# Patient Record
Sex: Female | Born: 1975
Health system: Southern US, Community
[De-identification: ages and names within clinical notes are randomized; demographics above are authoritative.]

## PROBLEM LIST (undated history)

## (undated) DIAGNOSIS — I509 Heart failure, unspecified: Secondary | ICD-10-CM

## (undated) DIAGNOSIS — F419 Anxiety disorder, unspecified: Secondary | ICD-10-CM

## (undated) DIAGNOSIS — I10 Essential (primary) hypertension: Secondary | ICD-10-CM

## (undated) HISTORY — DX: Essential (primary) hypertension: I10

## (undated) HISTORY — PX: ABDOMINAL HYSTERECTOMY: SHX81

## (undated) HISTORY — DX: Anxiety disorder, unspecified: F41.9

---

## 1997-11-26 ENCOUNTER — Emergency Department (HOSPITAL_COMMUNITY): Admission: EM | Admit: 1997-11-26 | Discharge: 1997-11-26 | Payer: Self-pay | Admitting: Emergency Medicine

## 2000-03-28 ENCOUNTER — Inpatient Hospital Stay (HOSPITAL_COMMUNITY): Admission: EM | Admit: 2000-03-28 | Discharge: 2000-03-28 | Payer: Self-pay | Admitting: *Deleted

## 2002-07-08 ENCOUNTER — Emergency Department (HOSPITAL_COMMUNITY): Admission: EM | Admit: 2002-07-08 | Discharge: 2002-07-08 | Payer: Self-pay | Admitting: Emergency Medicine

## 2002-07-17 ENCOUNTER — Emergency Department (HOSPITAL_COMMUNITY): Admission: EM | Admit: 2002-07-17 | Discharge: 2002-07-17 | Payer: Self-pay | Admitting: Emergency Medicine

## 2003-03-12 ENCOUNTER — Inpatient Hospital Stay (HOSPITAL_COMMUNITY): Admission: AD | Admit: 2003-03-12 | Discharge: 2003-03-12 | Payer: Self-pay | Admitting: Obstetrics and Gynecology

## 2003-03-12 ENCOUNTER — Encounter: Payer: Self-pay | Admitting: Obstetrics and Gynecology

## 2003-04-01 ENCOUNTER — Encounter: Admission: RE | Admit: 2003-04-01 | Discharge: 2003-04-01 | Payer: Self-pay | Admitting: Obstetrics and Gynecology

## 2003-04-02 ENCOUNTER — Encounter: Payer: Self-pay | Admitting: Obstetrics and Gynecology

## 2003-04-02 ENCOUNTER — Ambulatory Visit (HOSPITAL_COMMUNITY): Admission: RE | Admit: 2003-04-02 | Discharge: 2003-04-02 | Payer: Self-pay | Admitting: Obstetrics and Gynecology

## 2003-05-05 ENCOUNTER — Inpatient Hospital Stay (HOSPITAL_COMMUNITY): Admission: AD | Admit: 2003-05-05 | Discharge: 2003-05-08 | Payer: Self-pay | Admitting: Obstetrics and Gynecology

## 2003-05-05 ENCOUNTER — Encounter (INDEPENDENT_AMBULATORY_CARE_PROVIDER_SITE_OTHER): Payer: Self-pay

## 2003-05-20 ENCOUNTER — Encounter: Admission: RE | Admit: 2003-05-20 | Discharge: 2003-05-20 | Payer: Self-pay | Admitting: Obstetrics and Gynecology

## 2003-05-29 ENCOUNTER — Encounter: Admission: RE | Admit: 2003-05-29 | Discharge: 2003-05-29 | Payer: Self-pay | Admitting: Obstetrics and Gynecology

## 2003-06-05 ENCOUNTER — Encounter: Admission: RE | Admit: 2003-06-05 | Discharge: 2003-06-05 | Payer: Self-pay | Admitting: Obstetrics and Gynecology

## 2003-08-23 HISTORY — PX: ABDOMINAL HYSTERECTOMY: SHX81

## 2003-09-10 ENCOUNTER — Encounter: Admission: RE | Admit: 2003-09-10 | Discharge: 2003-09-10 | Payer: Self-pay | Admitting: Obstetrics and Gynecology

## 2003-09-23 ENCOUNTER — Encounter: Admission: RE | Admit: 2003-09-23 | Discharge: 2003-09-23 | Payer: Self-pay | Admitting: Obstetrics and Gynecology

## 2004-04-25 ENCOUNTER — Emergency Department (HOSPITAL_COMMUNITY): Admission: EM | Admit: 2004-04-25 | Discharge: 2004-04-25 | Payer: Self-pay | Admitting: Emergency Medicine

## 2007-12-22 ENCOUNTER — Emergency Department (HOSPITAL_COMMUNITY): Admission: EM | Admit: 2007-12-22 | Discharge: 2007-12-22 | Payer: Self-pay | Admitting: Emergency Medicine

## 2009-01-22 ENCOUNTER — Emergency Department (HOSPITAL_COMMUNITY): Admission: EM | Admit: 2009-01-22 | Discharge: 2009-01-23 | Payer: Self-pay | Admitting: Emergency Medicine

## 2009-11-02 ENCOUNTER — Inpatient Hospital Stay (HOSPITAL_COMMUNITY): Admission: AD | Admit: 2009-11-02 | Discharge: 2009-11-03 | Payer: Self-pay | Admitting: Family Medicine

## 2010-11-12 LAB — DIFFERENTIAL
Basophils Absolute: 0 10*3/uL (ref 0.0–0.1)
Basophils Relative: 0 % (ref 0–1)
Eosinophils Absolute: 0.3 10*3/uL (ref 0.0–0.7)
Lymphocytes Relative: 17 % (ref 12–46)
Lymphs Abs: 2.5 10*3/uL (ref 0.7–4.0)
Monocytes Absolute: 1 10*3/uL (ref 0.1–1.0)
Neutro Abs: 11.1 10*3/uL — ABNORMAL HIGH (ref 1.7–7.7)

## 2010-11-12 LAB — CBC
HCT: 36.9 % (ref 36.0–46.0)
MCHC: 32.7 g/dL (ref 30.0–36.0)
MCV: 81.4 fL (ref 78.0–100.0)
Platelets: 312 10*3/uL (ref 150–400)
RDW: 14.7 % (ref 11.5–15.5)

## 2010-11-12 LAB — URINALYSIS, ROUTINE W REFLEX MICROSCOPIC
Ketones, ur: NEGATIVE mg/dL
Nitrite: NEGATIVE
Specific Gravity, Urine: 1.01 (ref 1.005–1.030)

## 2010-11-12 LAB — POCT PREGNANCY, URINE: Preg Test, Ur: NEGATIVE

## 2010-11-12 LAB — COMPREHENSIVE METABOLIC PANEL
AST: 13 U/L (ref 0–37)
Albumin: 3.3 g/dL — ABNORMAL LOW (ref 3.5–5.2)
BUN: 10 mg/dL (ref 6–23)
Calcium: 9 mg/dL (ref 8.4–10.5)
Creatinine, Ser: 0.69 mg/dL (ref 0.4–1.2)
GFR calc Af Amer: >60 mL/min (ref >60)

## 2010-11-12 LAB — GC/CHLAMYDIA PROBE AMP, GENITAL: Chlamydia, DNA Probe: NEGATIVE

## 2010-11-12 LAB — WET PREP, GENITAL

## 2010-11-29 LAB — URINALYSIS, ROUTINE W REFLEX MICROSCOPIC
Bilirubin Urine: NEGATIVE
Nitrite: NEGATIVE
Specific Gravity, Urine: 1.015 (ref 1.005–1.030)
pH: 6.5 (ref 5.0–8.0)

## 2010-11-29 LAB — URINE MICROSCOPIC-ADD ON

## 2011-01-07 NOTE — Group Therapy Note (Signed)
NAME:  DE, LIBMAN                          ACCOUNT NO.:  [id number]   MEDICAL RECORD NO.:  [id number]                   PATIENT TYPE:  OUT   LOCATION:  [facility]                           FACILITY:  [facility]   PHYSICIAN:  Argentina Donovan, MD                     DATE OF BIRTH:  11/14/1975   DATE OF SERVICE:  09/23/2003                                    CLINIC NOTE   REASON FOR VISIT:  The patient is a 35 year old nulligravida who is  postoperative for right oophorectomy and left ovarian cystectomy for benign  mature hematomas who had menopausal symptoms, was checked in October, and  found to have an elevated FSH and LH and was put on Mircette and calcium.  On the Mircette she had extraordinarily heavy periods, needing to wear four  pads and a diaper at a time, was placed on Ortho Tri-Cyclen.  She has had no  bleeding during the cycle.  We will observe and see what her period is like.  If her period is extremely heavy in this patient with no insurance I think  possibly we will consider placing the Mirena IUD and then continuing on a  low-dose birth control pill in the tricycle fashion.  The patient will call  after her period and let us know how that is going on.   IMPRESSION:  Dysfunctional uterine bleeding.                                               Argentina Donovan, MD    PR/MEDQ  D:  09/23/2003  T:  09/23/2003  Job:  [id number]

## 2011-01-07 NOTE — Group Therapy Note (Signed)
   NAME:  Lydia Thompson, Lydia Thompson                          ACCOUNT NO.:  [id number]   MEDICAL RECORD NO.:  [id number]                   PATIENT TYPE:  OUT   LOCATION:  [facility]                           FACILITY:  [facility]   PHYSICIAN:  Argentina Donovan, MD                     DATE OF BIRTH:  [birth year]   DATE OF SERVICE:  04/01/2003                                    CLINIC NOTE   The patient is a 35 year old nulligravid white female with a last menstrual  period of February 23, 2003, and another period that has begun today as she sits  here in the clinic.  She used Norplant from age 71 to 70, had it removed at  age 4, and from 61 to 19 had Depo-Provera.  Has not been on contraceptives  since.  Has always suffered from dysmenorrhea, and her periods have been  quite irregular, sometimes missing a couple of months, other times having  two within a month.  Thinking she was pregnant because of abdominal bloating  and nausea that occurred one day on the 21st of July, she went into the [facility]  for evaluation.  They did a sonogram at that time which showed bilateral  ovarian masses, complex cysts, measuring 6 x 6 x 7 cm and 5 x 8 x 6 cm.   On examination today the abdomen was soft, flat, nontender.  No masses,  organomegaly palpable.  Pelvic examination:  External genitalia was normal.  Vagina was clean and well rugated.  BUS within normal limits, with a large  amount of bleeding from the cervical os, which made doing an adequate Pap  smear not possible.  Bimanual pelvic examination reveals a large cul-de-sac  mass that could not be separated from the uterus, and the adnexa could not  be well outlined because of the habitus of the patient, being 213 pounds.   IMPRESSION:  Bilateral complex ovarian cysts by sonogram.   PLAN:  Get a CT scan to clarify a little more what we are dealing with and a  CA125.  I plan after that to call the patient and probably schedule her for  exploratory laparotomy.                                    Argentina Donovan, MD    PR/MEDQ  D:  04/01/2003  T:  04/02/2003  Job:  [id number]

## 2011-01-07 NOTE — Discharge Summary (Signed)
NAME:  Lydia Thompson, Lydia Thompson                          ACCOUNT NO.:  [id number]   MEDICAL RECORD NO.:  [id number]                   PATIENT TYPE:  INP   LOCATION:  9148                                 [facility]ILITY[facility]   PHYSICIAN:  Phil D. Okey Dupre, M.D.                  DATE OF BIRTH:  1975/11/10   DATE OF ADMISSION:  05/05/2003  DATE OF DISCHARGE:  05/08/2003                                 DISCHARGE SUMMARY   HOSPITAL COURSE:  The patient is a 35 year old nulligravida black female who  was admitted with two large ovarian cysts and underwent exploratory  laparotomy, right oophorectomy, left multiple ovarian cystectomy, and  reconstruction of the ovary on the day of admission.  Frozen section during  the surgery revealed some atypia in the left ovarian tissue, both ovaries  which showed cystic teratomas.  The patient has done very well  postoperatively, has been completely afebrile.  The hemoglobin on admission  which was 12.1 did drop to 8.8 on the day following surgery and was 8.8 on  the postoperative day #2 as well, so that has been stable, as has her white  count.  No other significant abnormal laboratory tests during her hospital  stay.   PHYSICAL EXAMINATION AT DISCHARGE:  HEENT:  Within normal limits.  NECK:  Supple with no masses.  LUNGS:  Clear to auscultation and percussion.  ABDOMEN:  Soft, flat, nontender, no masses, no organomegaly, with good bowel  sounds.  The incision is clean and healing well after staples were removed.  EXTREMITIES:  Normal.   INSTRUCTIONS:  We have given discharge instructions to the patient as to  follow-up, activities - specifically heavy lifting and driving.  She will  return to the clinic in two weeks for wound examination and also to receive  results of permanent pathology.  The pathology final report called to me by  the pathologist the day prior to discharge was pretty much the same as it  had been during the frozen section.  I consider that this is  benign;  however, it is suspicious enough that he wanted to send it for a second  opinion to [facility] pathology to confirm that, so  we should have that result for the patient when she returns in two weeks.   DISCHARGE MEDICATIONS:  SlowFe for iron replacement and Percocet for pain.   DISCHARGE DIAGNOSIS:  Status postoperative right oophorectomy, left ovarian  cystectomy and reconstruction of ovary for bilateral cystic teratomas with  secondary postoperative anemia.                                               Phil D. Okey Dupre, M.D.    PDR/MEDQ  D:  05/08/2003  T:  05/08/2003  Job:  [id number]

## 2011-01-07 NOTE — Op Note (Signed)
NAME:  Lydia Thompson, Lydia Thompson                          ACCOUNT NO.:  [id number]   MEDICAL RECORD NO.:  [id number]                   PATIENT TYPE:  INP   LOCATION:  9199                                 [facility]   PHYSICIAN:  Phil D. Okey Dupre, M.D.                  DATE OF BIRTH:  [birth year]   DATE OF PROCEDURE:  05/05/2003  DATE OF DISCHARGE:                                 OPERATIVE REPORT   PREOPERATIVE DIAGNOSIS:  Bilateral cystic teratoma of the ovaries.   POSTOPERATIVE DIAGNOSIS:  Right benign cystic keratoma, left cystic teratoma  with areas of atypia all by frozen section pending permanent pathology  report.   PROCEDURE:  Exploratory laparotomy, multiple left ovarian cystectomy,  reconstruction of left ovary, right oophorectomy.   SURGEON:  Javier Glazier. Rose, M.D.   ESTIMATED BLOOD LOSS:  200 mL.   ANESTHESIA:  General anesthesia.   OPERATIVE FINDINGS:  The left ovary measured approximately 10 x 6 cm and was  multicystic with obvious areas of teratoma where hair could be seen through  the wall.  The right ovary measured 14 x 10 x 7 cm and no areas of  apparently normal tissue could be seen.   DESCRIPTION OF PROCEDURE:  Under satisfactory general anesthesia, with the  patient in the dorsal supine position, a Foley catheter in the urinary  bladder, abdomen was prepped and draped in usual sterile manner and through  a transverse Pfannenstiel incision situated 3 cm above the symphysis pubis  and extending for a total length of 20 cm.  The abdomen was entered by  layers.  On entering the peritoneal cavity, the findings were as  aforementioned.  The uterus was normal size.  I was unable to bring out of  the incision the right ovary as the measurements were previously described,  so the right rectus muscle was transected and the inferior epigastric  vessels tied off with #1 chromic catgut sutures.  This allowed  exteriorization of both ovaries and they were as aforementioned.  The  ovaries on the left were shelled out, careful dissection of the cyst on the  left and as we got down, there was a small amount of viable ovarian tissue  which was reconstructed with 3-0 chromic catgut suture on a GI needle using  a mattress suture backed into what appeared to be a viable ovary measuring  approximately 4 cm by 2 cm and the tube on that side was normal.  The left  ovary was extraordinarily vascular around the pedicle.  I saw no area that  appeared to be normal and attempting to go through that area, we ruptured  the dermoid.  It was decided then to take out that ovary as there was  probably little viable tissue left because of the size.  Clamps were placed  across the meso beneath the ovary serially to separate it from the  fallopian  tube which was normal and the pedicle was ligated with interrupted suture  ligatures of 3-0 chromic catgut.  Both were sent for pathological frozen  section and we got a report back on the left ovary that there were areas of  concern and atypia but it was not confirmation enough to indicate necessity  for removal of that ovary. Washings of the pelvis were sent for pathological  diagnosis and cell block.  The right ovary was reported back as a benign  cystic teratoma with mainly brain tissue.  Examination of the upper abdomen  was normal and the area was irrigated and the remaining pedicles observed  for any bleeding, none was noted. The fascia was closed with continuous  running alternating locked 0 Vicryl from either incision meeting in the  midpoint.  Subcutaneous closure was accomplished with 2-0 plain catgut  suture and  skin edge approximated with skin staples.  A dry sterile pressure dressing  was applied and the patient was transferred to the recovery room with 200 mL  blood loss.  Tape, sponge, needle and instrument counts were reported  correct at the end of the procedure.                                               Phil D. Okey Dupre,  M.D.    PDR/MEDQ  D:  05/05/2003  T:  05/05/2003  Job:  [id number]

## 2011-01-07 NOTE — H&P (Signed)
NAME:  Lydia Thompson, Lydia Thompson                          ACCOUNT NO.:  [id number]   MEDICAL RECORD NO.:  [id number]                   PATIENT TYPE:  INP   LOCATION:  NA                                   [facility]   PHYSICIAN:  Phil D. Okey Dupre, M.D.                  DATE OF BIRTH:  Mar 14, 1976   DATE OF ADMISSION:  05/05/2003  DATE OF DISCHARGE:                                HISTORY & PHYSICAL   PREOPERATIVE HISTORY AND PHYSICAL:   CHIEF COMPLAINT:  I was told I had ovarian cysts.   PRESENT ILLNESS:  The patient is a 35 year old nulligravida white female  with her last menstrual period beginning today on April 30, 2003.  Previous menstrual period was April 01, 2003.  She used Norplant from the  age of 39 to 37 and had it removed at 51, from 61 to 40 she had Depo-Provera  and has not been on contraceptives since that time.  She has always suffered  from severe dysmenorrhea, her periods have been quite irregular up until  lately, sometimes missing a couple months, other times having two within a  month.  Thinking she was pregnant because of abdominal bloating and nausea  that occurred in July she went into [facility]U for evaluation.  They did a sonogram  at that time that showed bilateral ovarian masses with complex cysts  measuring 6 x 6 x 7 and 5 x 8 x 6 cm and she came into the clinic on April 01, 2003 and was scheduled for diagnostic D&C because of heavy vaginal  bleeding history and an exploratory laparotomy with probable bilateral  ovarian cystectomies, possible oophorectomy.  We discussed this with the  patient in detail although by all indications these are probably benign.   ALLERGIES:  The patient has no known allergies.   MEDICATIONS:  She is on no medications on a usual basis.   PAST MEDICAL HISTORY:  There is no significant past medical history except  those in the present illness and she does have sickle cell trait.   FAMILY HISTORY:  Diabetes in her grandmother, her mother  and several other  relatives have arthritis.   REVIEW OF SYSTEMS:  Really negative except for the present illness and at  this time even those symptoms are gone.   PHYSICAL EXAMINATION:  VITAL SIGNS:  Her blood pressure is 125/84.  Her  pulse is 73.  Her temperature is 98.9.  Respirations are 14 per minute.  The  patient weighs 213 pounds.  GENERAL:  She is well-developed slightly obese white female in no acute  distress.  HEENT:  Within normal limits.  NECK:  Supple.  Her thyroid is bilaterally symmetrical with no masses.  LUNGS:  Clear to auscultation and percussion.  HEART:  No murmur.  Normal sinus rhythm.  Point of maximal impulse in the  fifth intercostal space and midclavicular  line.  BREASTS:  Symmetrical with no dominant masses and no nipple discharge.  ABDOMEN:  Soft, flat, nontender.  No masses nor organomegaly palpable.  PELVIC:  External genitalia is normal.  The vagina is clean and well  rugated.  BUS within normal limits.  Large amount of bleeding at the  cervical os.  The Pap smear was delayed.  Bimanual pelvic examination  reveals a large cul-de-sac mass, it could not be separated from the uterus  and the adnexa could not be well outlined because of habitus of the patient  and no adenopathy noted.  EXTREMITIES:  No edema.  No varices.  DTRs within normal limits.  SKIN:  Normal turgor and pallor.   IMPRESSION:  Bilateral complex ovarian cysts by sonogram.  The CT scan of  the pelvis showed large bilateral ovarian dermoids measuring 12 cm on the  right and 9 cm on the left.   PLAN:  Has been above outlined.                                               Phil D. Okey Dupre, M.D.    PDR/MEDQ  D:  05/01/2003  T:  05/01/2003  Job:  [id number]

## 2011-01-07 NOTE — Group Therapy Note (Signed)
   NAME:  Lydia Thompson, Lydia Thompson                          ACCOUNT NO.:  [id number]   MEDICAL RECORD NO.:  [id number]                   PATIENT TYPE:  OUT   LOCATION:  [facility]                           [facility]AC[facility]  [facility]   PHYSICIAN:  Tinnie Gens, MD                     DATE OF BIRTH:  02-Dec-1975   DATE OF SERVICE:  06/05/2003                                    CLINIC NOTE   CHIEF COMPLAINT:  Recheck.   HISTORY OF PRESENT ILLNESS:  The patient is a 35 year old nulligravida who  comes in today for postoperative follow-up.  She is reporting continued hot  flashes that seem to be worsening.  She has not had a period since her  surgery.  She is status post right oophorectomy and left ovarian cystectomy  for bilateral mature teratomas.  She also was seen with this complaint a  week ago and had hormone levels drawn at that time and a Pap smear as well.  Review of her Pap smear reveals that it was normal and her labs show an FHS  of 29.4 and an LH of 27.4 consistent with menopause.   PHYSICAL EXAMINATION:  GENERAL:  She is an obese black female in no acute  distress.  VITAL SIGNS:  As noted in the chart.  ABDOMEN:  Soft and nontender.  The incision is clean and healing well.  GENITOURINARY:  The previous hematoma of the labia has resolved.   IMPRESSION:  Postmenopausal secondary to surgical removal of ovarian tissue.  Discussed with this patient at length implications of this including  postmenopause hot flashes, vaginal dryness as well as bone loss.  Also  discussed with the patient future childbearing, its limitations, and  possibility of egg donation.   PLAN:  Will start this patient on Mircette low-dose oral contraceptive to  control her symptoms.  Did discuss that she should have a period with these  and continue to take them.  Also prescribed 1500 mg a day of calcium.                                               Tinnie Gens, MD    TP/MEDQ  D:  06/05/2003  T:  06/05/2003  Job:   [id number]

## 2015-01-13 ENCOUNTER — Ambulatory Visit (INDEPENDENT_AMBULATORY_CARE_PROVIDER_SITE_OTHER): Payer: Self-pay | Admitting: Urgent Care

## 2015-01-13 VITALS — BP 104/78 | HR 91 | Temp 98.8°F | Resp 18 | Ht 64.0 in | Wt 213.6 lb

## 2015-01-13 DIAGNOSIS — R3589 Other polyuria: Secondary | ICD-10-CM

## 2015-01-13 DIAGNOSIS — Z8679 Personal history of other diseases of the circulatory system: Secondary | ICD-10-CM

## 2015-01-13 DIAGNOSIS — R519 Headache, unspecified: Secondary | ICD-10-CM

## 2015-01-13 DIAGNOSIS — R51 Headache: Secondary | ICD-10-CM

## 2015-01-13 DIAGNOSIS — R011 Cardiac murmur, unspecified: Secondary | ICD-10-CM

## 2015-01-13 DIAGNOSIS — R8290 Unspecified abnormal findings in urine: Secondary | ICD-10-CM

## 2015-01-13 DIAGNOSIS — R42 Dizziness and giddiness: Secondary | ICD-10-CM

## 2015-01-13 DIAGNOSIS — R631 Polydipsia: Secondary | ICD-10-CM

## 2015-01-13 DIAGNOSIS — N390 Urinary tract infection, site not specified: Secondary | ICD-10-CM

## 2015-01-13 DIAGNOSIS — R358 Other polyuria: Secondary | ICD-10-CM

## 2015-01-13 LAB — COMPREHENSIVE METABOLIC PANEL
ALBUMIN: 4.3 g/dL (ref 3.5–5.2)
ALK PHOS: 61 U/L (ref 39–117)
ALT: 19 U/L (ref 0–35)
AST: 36 U/L (ref 0–37)
BILIRUBIN TOTAL: 0.4 mg/dL (ref 0.2–1.2)
BUN: 13 mg/dL (ref 6–23)
CO2: 27 mEq/L (ref 19–32)
CREATININE: 0.67 mg/dL (ref 0.50–1.10)
Calcium: 9.2 mg/dL (ref 8.4–10.5)
Chloride: 99 mEq/L (ref 96–112)
Glucose, Bld: 85 mg/dL (ref 70–99)
POTASSIUM: 5.8 meq/L — AB (ref 3.5–5.3)
Sodium: 136 mEq/L (ref 135–145)
Total Protein: 7.9 g/dL (ref 6.0–8.3)

## 2015-01-13 LAB — POCT URINALYSIS DIPSTICK
Bilirubin, UA: NEGATIVE
Blood, UA: NEGATIVE
Glucose, UA: NEGATIVE
Ketones, UA: NEGATIVE
Nitrite, UA: POSITIVE
PH UA: 7
PROTEIN UA: NEGATIVE
Spec Grav, UA: 1.015
UROBILINOGEN UA: 0.2

## 2015-01-13 LAB — POCT UA - MICROSCOPIC ONLY
CASTS, UR, LPF, POC: NEGATIVE
Crystals, Ur, HPF, POC: NEGATIVE
MUCUS UA: NEGATIVE
RBC, URINE, MICROSCOPIC: NEGATIVE
YEAST UA: NEGATIVE

## 2015-01-13 LAB — POCT GLYCOSYLATED HEMOGLOBIN (HGB A1C): HEMOGLOBIN A1C: 5.1

## 2015-01-13 MED ORDER — NITROFURANTOIN MONOHYD MACRO 100 MG PO CAPS: 100.0000 mg | ORAL_CAPSULE | Freq: Two times a day (BID) | ORAL | Status: AC

## 2015-01-13 NOTE — Progress Notes (Signed)
MRN: [id number] DOB: 1976/08/11  Subjective:   Lydia Thompson is a 39 y.o. female presenting for chief complaint of elevated blood pressure and Dizziness  Reports history of HTN, checks her BP from time to time. Patient felt dizzy and thirsty this morning so she checked her BP using a wrist cuff and her BP was 165/115. Also had associated heart racing and palpitations. Has not tried any medications for relief. She started to drink water, went to her friends house and ate fruit from a juicer. Denies chest pain, shob, n/v, abdominal pain. However, admits daily intermittent headaches associated with stress at work. Works as an in Doctor, general practice. Also has numbness and tingling of hands and feet intermittently. Frequent urination, feels thirsty all the time, drinks plenty of water and sodas. Diet is mixed, hot dogs and chips, also eats salads. Denies vision changes, double vision, hematuria, dysuria, flank pain, fevers. Denies any other aggravating or relieving factors, no other questions or concerns.  Lydia Thompson currently has no medications in their medication list. She has No Known Allergies.  Lydia Thompson  has a past medical history of Hypertension. Also  has past surgical history that includes Abdominal hysterectomy.  ROS As in subjective.  Objective:   Vitals: BP 104/78 mmHg  Pulse 91  Temp(Src) 98.8 F (37.1 C) (Oral)  Resp 18  Ht  (1.626 m)  Wt 213 lb 9.6 oz (96.888 kg)  BMI 36.65 kg/m2  SpO2 98%  Physical Exam  Constitutional: She is oriented to person, place, and time. She appears well-developed and well-nourished.  HENT:  Mouth/Throat: Oropharynx is clear and moist. No oropharyngeal exudate.  Cardiovascular: Normal rate, regular rhythm and intact distal pulses.  Exam reveals no gallop and no friction rub.   Murmur (low grade (II/VI) systolic murmur best heard at LUSB) heard. Pulmonary/Chest: No respiratory distress. She has no wheezes. She has no rales. She exhibits no tenderness.    Abdominal: Soft. Bowel sounds are normal. She exhibits no distension and no mass. There is no tenderness.  Neurological: She is alert and oriented to person, place, and time.  Skin: Skin is warm and dry. No rash noted. No erythema. No pallor.  Psychiatric: Her mood appears anxious. Her affect is not angry, not labile and not inappropriate. She exhibits a depressed mood (flat affect).   Results for orders placed or performed in visit on 01/13/15 (from the past 24 hour(s))  POCT urinalysis dipstick     Status: None   Collection Time: 01/13/15 11:27 AM  Result Value Ref Range   Color, UA yellow    Clarity, UA clear    Glucose, UA neg    Bilirubin, UA neg    Ketones, UA neg    Spec Grav, UA 1.015    Blood, UA neg    pH, UA 7.0    Protein, UA neg    Urobilinogen, UA 0.2    Nitrite, UA positive    Leukocytes, UA small (1+)   POCT glycosylated hemoglobin (Hb A1C)     Status: None   Collection Time: 01/13/15 11:28 AM  Result Value Ref Range   Hemoglobin A1C 5.1   POCT UA - Microscopic Only     Status: None   Collection Time: 01/13/15 11:39 AM  Result Value Ref Range   WBC, Ur, HPF, POC 0-1    RBC, urine, microscopic neg    Bacteria, U Microscopic 3+    Mucus, UA neg    Epithelial cells, urine per micros  0-1    Crystals, Ur, HPF, POC neg    Casts, Ur, LPF, POC neg    Yeast, UA neg    Assessment and Plan :   1. History of hypertension 2. Heart murmur 3. Dizziness - Patient declined EKG today, will continue to monitor BP, advised patient establish regular healthcare. Start healthy diet and exercise. Recheck symptoms and BP in 4 weeks.  4. Generalized headaches - Suspect stress or mood component considering that she admits having difficulty with stress, does not have close family relationships, lives on her own; however, she does admit having strong support among friends. Will revisit this idea as patient will try NSAID treatment.   5. Polydipsia - Negative for diabetes, advised  that she change her diet which includes a lot of salt, restaurant foods  6. Polyuria 7. Abnormal urinalysis 8. Urinary tract infection without hematuria, site unspecified - Urine culture pending, will start Macrobid x7 days, counseled on ways to avoid UTI - Report results to [phone], VM okay  Lydia BambergMario Dotsie Gillette, PA-C [facility] [phone] 01/13/2015 10:50 AM

## 2015-01-13 NOTE — Patient Instructions (Signed)

## 2015-01-15 LAB — URINE CULTURE

## 2015-03-18 DIAGNOSIS — R030 Elevated blood-pressure reading, without diagnosis of hypertension: Secondary | ICD-10-CM | POA: Insufficient documentation

## 2017-09-25 ENCOUNTER — Inpatient Hospital Stay (HOSPITAL_COMMUNITY)
Admission: EM | Admit: 2017-09-25 | Discharge: 2017-09-27 | DRG: 293 | Disposition: A | Discharge status: Home / Self Care | Payer: Self-pay | Attending: Internal Medicine | Admitting: Internal Medicine

## 2017-09-25 ENCOUNTER — Emergency Department (HOSPITAL_COMMUNITY): Payer: Self-pay

## 2017-09-25 ENCOUNTER — Other Ambulatory Visit: Payer: Self-pay

## 2017-09-25 ENCOUNTER — Encounter (HOSPITAL_COMMUNITY): Payer: Self-pay | Admitting: Emergency Medicine

## 2017-09-25 DIAGNOSIS — I10 Essential (primary) hypertension: Secondary | ICD-10-CM | POA: Diagnosis present

## 2017-09-25 DIAGNOSIS — R000 Tachycardia, unspecified: Secondary | ICD-10-CM | POA: Diagnosis present

## 2017-09-25 DIAGNOSIS — I509 Heart failure, unspecified: Secondary | ICD-10-CM

## 2017-09-25 DIAGNOSIS — I2721 Secondary pulmonary arterial hypertension: Secondary | ICD-10-CM | POA: Diagnosis present

## 2017-09-25 DIAGNOSIS — Z6836 Body mass index (BMI) 36.0-36.9, adult: Secondary | ICD-10-CM

## 2017-09-25 DIAGNOSIS — I110 Hypertensive heart disease with heart failure: Principal | ICD-10-CM | POA: Diagnosis present

## 2017-09-25 DIAGNOSIS — D72829 Elevated white blood cell count, unspecified: Secondary | ICD-10-CM | POA: Diagnosis present

## 2017-09-25 DIAGNOSIS — R0602 Shortness of breath: Secondary | ICD-10-CM | POA: Diagnosis present

## 2017-09-25 DIAGNOSIS — I517 Cardiomegaly: Secondary | ICD-10-CM

## 2017-09-25 DIAGNOSIS — E876 Hypokalemia: Secondary | ICD-10-CM | POA: Diagnosis present

## 2017-09-25 DIAGNOSIS — E669 Obesity, unspecified: Secondary | ICD-10-CM | POA: Diagnosis present

## 2017-09-25 DIAGNOSIS — R079 Chest pain, unspecified: Secondary | ICD-10-CM

## 2017-09-25 DIAGNOSIS — R011 Cardiac murmur, unspecified: Secondary | ICD-10-CM

## 2017-09-25 DIAGNOSIS — Z9119 Patient's noncompliance with other medical treatment and regimen: Secondary | ICD-10-CM

## 2017-09-25 DIAGNOSIS — Z79899 Other long term (current) drug therapy: Secondary | ICD-10-CM

## 2017-09-25 DIAGNOSIS — I5033 Acute on chronic diastolic (congestive) heart failure: Secondary | ICD-10-CM | POA: Diagnosis present

## 2017-09-25 DIAGNOSIS — R0789 Other chest pain: Secondary | ICD-10-CM

## 2017-09-25 DIAGNOSIS — R072 Precordial pain: Secondary | ICD-10-CM | POA: Diagnosis present

## 2017-09-25 LAB — BASIC METABOLIC PANEL
Anion gap: 6 (ref 5–15)
BUN: 19 mg/dL (ref 6–20)
CO2: 28 mmol/L (ref 22–32)
Calcium: 9.1 mg/dL (ref 8.9–10.3)
Chloride: 107 mmol/L (ref 101–111)
Creatinine, Ser: 0.73 mg/dL (ref 0.44–1.00)
GFR calc Af Amer: >60 mL/min (ref >60)
GLUCOSE: 94 mg/dL (ref 65–99)
POTASSIUM: 3.4 mmol/L — AB (ref 3.5–5.1)
Sodium: 141 mmol/L (ref 135–145)

## 2017-09-25 LAB — CBC
HEMATOCRIT: 39.6 % (ref 36.0–46.0)
Hemoglobin: 13.2 g/dL (ref 12.0–15.0)
MCH: 26.3 pg (ref 26.0–34.0)
MCHC: 33.3 g/dL (ref 30.0–36.0)
MCV: 78.9 fL (ref 78.0–100.0)
Platelets: 280 10*3/uL (ref 150–400)
RBC: 5.02 MIL/uL (ref 3.87–5.11)
RDW: 14.1 % (ref 11.5–15.5)
WBC: 16.9 10*3/uL — ABNORMAL HIGH (ref 4.0–10.5)

## 2017-09-25 LAB — I-STAT BETA HCG BLOOD, ED (MC, WL, AP ONLY): I-stat hCG, quantitative: <5 m[IU]/mL (ref <5)

## 2017-09-25 LAB — I-STAT TROPONIN, ED
Troponin i, poc: 0 ng/mL (ref 0.00–0.08)
Troponin i, poc: 0 ng/mL (ref 0.00–0.08)

## 2017-09-25 LAB — BRAIN NATRIURETIC PEPTIDE: B Natriuretic Peptide: 264.2 pg/mL — ABNORMAL HIGH (ref 0.0–100.0)

## 2017-09-25 MED ORDER — IOPAMIDOL (ISOVUE-300) INJECTION 61%
100.0000 mL | Freq: Once | INTRAVENOUS | Status: AC | PRN
  Administered 2017-09-25: 100 mL via INTRAVENOUS

## 2017-09-25 MED ORDER — FUROSEMIDE 10 MG/ML IJ SOLN
40.0000 mg | Freq: Once | INTRAMUSCULAR | Status: AC
  Administered 2017-09-25: 40 mg via INTRAVENOUS
  Filled 2017-09-25: qty 4

## 2017-09-25 MED ORDER — IOPAMIDOL (ISOVUE-370) INJECTION 76%
INTRAVENOUS | Status: AC
  Filled 2017-09-25: qty 100

## 2017-09-25 MED ORDER — ASPIRIN 81 MG PO CHEW
324.0000 mg | CHEWABLE_TABLET | Freq: Once | ORAL | Status: AC
  Administered 2017-09-25: 324 mg via ORAL
  Filled 2017-09-25: qty 4

## 2017-09-25 MED ORDER — POTASSIUM CHLORIDE CRYS ER 20 MEQ PO TBCR
40.0000 meq | EXTENDED_RELEASE_TABLET | Freq: Once | ORAL | Status: AC
  Administered 2017-09-25: 40 meq via ORAL
  Filled 2017-09-25: qty 2

## 2017-09-25 NOTE — ED Notes (Addendum)
Pt is alert and oriented x 4 and is verbally responsive.Pt reports having chest palpitations that stared ;ast night and worsens when she is lying flat pt does report associated SOB. Pt denies N/V, or abdominal pain Pt denies pain at this time of assessment.

## 2017-09-25 NOTE — ED Triage Notes (Signed)
Patient reports that she has intermittent heart palpitations/rapid heart rate esp when she lays flat. Reports intermittent central chest pains for "while". reports that she was on Phentermine for diet but stopped.

## 2017-09-25 NOTE — ED Provider Notes (Signed)
[facility]-EMERGENCY DEPT Provider Note   CSN: [id number] Arrival date & time: 09/25/17  1150     History   Chief Complaint Chief Complaint  Patient presents with  . Chest Pain  . Palpitations    HPI Lydia Thompson is a 42 y.o. female.  HPI Patient had an episode of chest pain during the night last night with shortness of breath.  She reports pain and shortness of breath or worse in the supine position and she had to sit up.  She reports that helped somewhat.  She reports she also has had increased feeling of palpitations or shortness of breath with exertion.  She reports for some time she is intermittently taking fluid pills for swelling in her legs but denies ever having been diagnosed with any kind of heart problems.  She denies any history of having echocardiogram or stress test.  No fevers chills or productive cough.  No calf pain.  No recent travel or immobilization. Past Medical History:  Diagnosis Date  . Hypertension     There are no active problems to display for this patient.   Past Surgical History:  Procedure Laterality Date  . ABDOMINAL HYSTERECTOMY      OB History    No data available       Home Medications    Prior to Admission medications   Medication Sig Start Date End Date Taking? Authorizing Provider  BLACK COHOSH PO Take 1 tablet by mouth 2 (two) times daily.   Yes [provider]  furosemide (LASIX) 20 MG tablet TAKE 1 TABLET BY MOUTH EVERY DAY FOR EDEMA 08/20/17  Yes [provider]    Family History Family History  Problem Relation Age of Onset  . Hypertension Mother   . Heart disease Paternal Uncle     Social History Social History   Tobacco Use  . Smoking status: Never Smoker  . Smokeless tobacco: Never Used  Substance Use Topics  . Alcohol use: No    Alcohol/week: 0.0 oz  . Drug use: No     Allergies   Patient has no known allergies.   Review of Systems Review of Systems 10  Systems reviewed and are negative for acute change except as noted in the HPI.  Physical Exam Updated Vital Signs BP 122/77   Pulse 80   Temp 98.1 F (36.7 C) (Oral)   Resp 19   Ht 5\' 5"  (1.651 m)   Wt 104.3 kg (230 lb)   SpO2 98%   BMI 38.27 kg/m   Physical Exam  Constitutional: She is oriented to person, place, and time.  Patient is alert and nontoxic.  No respiratory distress at rest.  Moderate obesity.  Mental status clear.  HENT:  Head: Normocephalic and atraumatic.  Nose: Nose normal.  Mouth/Throat: Oropharynx is clear and moist.  Eyes: EOM are normal. Pupils are equal, round, and reactive to light.  Neck: Neck supple. No thyromegaly present.  Cardiovascular:  Heart is regular.  3\6  ejection murmur   Pulmonary/Chest: Effort normal and breath sounds normal.  Abdominal: Soft. She exhibits no distension. There is no tenderness. There is no guarding.  Musculoskeletal: Normal range of motion. She exhibits no tenderness.  Trace pitting edema bilateral lower extremities.  Lymphadenopathy:    She has no cervical adenopathy.  Neurological: She is alert and oriented to person, place, and time. No cranial nerve deficit. She exhibits normal muscle tone. Coordination normal.  Skin: Skin is warm and dry.  Psychiatric: She has a normal mood and affect.     ED Treatments / Results  Labs (all labs ordered are listed, but only abnormal results are displayed) Labs Reviewed  BASIC METABOLIC PANEL - Abnormal; Notable for the following components:      Result Value   Potassium 3.4 (*)    All other components within normal limits  CBC - Abnormal; Notable for the following components:   WBC 16.9 (*)    All other components within normal limits  BRAIN NATRIURETIC PEPTIDE - Abnormal; Notable for the following components:   B Natriuretic Peptide 264.2 (*)    All other components within normal limits  I-STAT TROPONIN, ED  I-STAT BETA HCG BLOOD, ED ([facility], [facility], [facility] ONLY)  I-STAT TROPONIN,  ED    EKG  EKG Interpretation  Date/Time:  Monday September 25 2017 11:57:34 EST Ventricular Rate:  88 PR Interval:    QRS Duration: 89 QT Interval:  349 QTC Calculation: 423 R Axis:   59 Text Interpretation:  Sinus rhythm LAE, consider biatrial enlargement Probable LVH with secondary repol abnrm Baseline wander in lead(s) V1 agree. no old comparison Confirmed by Arby Barrette [phone]) on 09/25/2017 7:03:39 PM       Radiology Dg Chest 2 View  Result Date: 09/25/2017 CLINICAL DATA:  Mid chest pain and shortness of breath for the past week. Increasing severity last night. History of hypertension, nonsmoker. EXAM: CHEST  2 VIEW COMPARISON:  None in PACs FINDINGS: The lungs are adequately inflated. The interstitial markings are mildly increased bilaterally. There is no alveolar infiltrate or pleural effusion. The heart is normal in size. The central pulmonary vascularity is mildly prominent. The trachea is midline. The bony thorax exhibits no acute abnormality. IMPRESSION: Mild interstitial prominence bilaterally may reflect interstitial pneumonia or possibly mild interstitial edema in the appropriate clinical setting. No alveolar pneumonia. Electronically Signed   By: David  [place] M.D.   On: 09/25/2017 12:26   Ct Angio Chest Pe W/cm &/or Wo Cm  Result Date: 09/25/2017 CLINICAL DATA:  Shortness of breath and cardiac arrhythmia EXAM: CT ANGIOGRAPHY CHEST WITH CONTRAST TECHNIQUE: Multidetector CT imaging of the chest was performed using the standard protocol during bolus administration of intravenous contrast. Multiplanar CT image reconstructions and MIPs were obtained to evaluate the vascular anatomy. CONTRAST:  ISOVUE-300 IOPAMIDOL (ISOVUE-300) INJECTION 61% COMPARISON:  September 25, 2017 chest radiograph FINDINGS: Cardiovascular: There is no demonstrable pulmonary embolus. There is no thoracic aortic aneurysm or dissection. Visualized great vessels appear normal. Note that the right and left  common carotid arteries arise as a common trunk, an anatomic variant. There is no appreciable pericardial effusion or pericardial thickening. There is mild left ventricular hypertrophy. There is prominence of the main pulmonary outflow tract with a measured diameter of 3.6 cm, a finding felt to be indicative of a degree of pulmonary arterial hypertension. Mediastinum/Nodes: Visualized thyroid appears normal. There is no appreciable thoracic adenopathy. No esophageal lesions are evident. Lungs/Pleura: There are small pleural effusions bilaterally. There is patchy mosaic attenuation bilaterally, likely representing small airways obstructive type disease with atelectasis. A mild degree of superimposed interstitial pulmonary edema is questioned. There is no well-defined airspace consolidation. Upper Abdomen: Visualized upper abdominal structures appear unremarkable. Musculoskeletal: There are no blastic or lytic bone lesions. Review of the MIP images confirms the above findings. IMPRESSION: 1.  No demonstrable pulmonary embolus. 2. Small pleural effusions with suspected degree of pulmonary edema. There may be early congestive heart failure. 3. There are areas  of atelectatic change. There may be a degree of small airways obstructive disease. The appearance does suggest that there may be a combination of interstitial edema and small airways obstruction. There is no frank consolidation. 4. Prominence of the main pulmonary outflow tract, a finding felt to be indicative of pulmonary arterial hypertension. 5.  No evident thoracic adenopathy. Electronically Signed   By: Bretta BangWilliam  Woodruff III M.D.   On: 09/25/2017 20:33    Procedures Procedures (including critical care time)  Medications Ordered in ED Medications  iopamidol (ISOVUE-370) 76 % injection (not administered)  potassium chloride SA (K-DUR,KLOR-CON) CR tablet 40 mEq (40 mEq Oral Given 09/25/17 2029)  iopamidol (ISOVUE-300) 61 % injection 100 mL (100 mLs  Intravenous Contrast Given 09/25/17 2017)  aspirin chewable tablet 324 mg (324 mg Oral Given 09/25/17 2057)  furosemide (LASIX) injection 40 mg (40 mg Intravenous Given 09/25/17 2058)     Initial Impression / Assessment and Plan / ED Course  I have reviewed the triage vital signs and the nursing notes.  Pertinent labs & imaging results that were available during my care of the patient were reviewed by me and considered in my medical decision making (see chart for details).    Consult: (22: 05) Dr.Vedre advises patient will need echocardiogram.  May diuresis overnight.  Consult cardiology a.m. team for assessment.  Admit to hospitalist.  Final Clinical Impressions(s) / ED Diagnoses   Final diagnoses:  Acute congestive heart failure, unspecified heart failure type (HCC)  LAE (left atrial enlargement)  Precordial pain  Heart murmur  Patient presents with exertional dyspnea palpitations, orthopnea and chest pain.  EKG abnormal and heart murmur on exam.  CT PE study rules out for PE or dissection.  Shows vascular congestion and small pleural effusions.  Lasix initiated in the emergency department.  Will plan to admit for diuresis and a.m. echocardiogram and cardiology consultation.  ED Discharge Orders    None       Arby BarrettePfeiffer, Gabryel Files, MD 09/25/17 2213

## 2017-09-26 ENCOUNTER — Inpatient Hospital Stay (HOSPITAL_COMMUNITY): Payer: Self-pay

## 2017-09-26 ENCOUNTER — Encounter (HOSPITAL_COMMUNITY): Payer: Self-pay

## 2017-09-26 DIAGNOSIS — R079 Chest pain, unspecified: Secondary | ICD-10-CM

## 2017-09-26 DIAGNOSIS — R0789 Other chest pain: Secondary | ICD-10-CM

## 2017-09-26 DIAGNOSIS — I10 Essential (primary) hypertension: Secondary | ICD-10-CM | POA: Diagnosis present

## 2017-09-26 DIAGNOSIS — R0602 Shortness of breath: Secondary | ICD-10-CM

## 2017-09-26 HISTORY — DX: Chest pain, unspecified: R07.9

## 2017-09-26 LAB — BASIC METABOLIC PANEL
Anion gap: 9 (ref 5–15)
BUN: 14 mg/dL (ref 6–20)
CALCIUM: 8.8 mg/dL — AB (ref 8.9–10.3)
CO2: 24 mmol/L (ref 22–32)
CREATININE: 0.69 mg/dL (ref 0.44–1.00)
Chloride: 105 mmol/L (ref 101–111)
Glucose, Bld: 100 mg/dL — ABNORMAL HIGH (ref 65–99)
Potassium: 3.7 mmol/L (ref 3.5–5.1)
SODIUM: 138 mmol/L (ref 135–145)

## 2017-09-26 LAB — LIPID PANEL
CHOL/HDL RATIO: 4.1 ratio
Cholesterol: 163 mg/dL (ref 0–200)
HDL: 40 mg/dL — ABNORMAL LOW (ref >40)
LDL CALC: 112 mg/dL — AB (ref 0–99)
Triglycerides: 55 mg/dL (ref <150)
VLDL: 11 mg/dL (ref 0–40)

## 2017-09-26 LAB — ECHOCARDIOGRAM COMPLETE
Height: 65 in
Weight: 3680 oz

## 2017-09-26 LAB — TROPONIN I: Troponin I: <0.03 ng/mL (ref <0.03)

## 2017-09-26 LAB — MAGNESIUM: MAGNESIUM: 2 mg/dL (ref 1.7–2.4)

## 2017-09-26 LAB — HIV ANTIBODY (ROUTINE TESTING W REFLEX): HIV SCREEN 4TH GENERATION: NONREACTIVE

## 2017-09-26 MED ORDER — POTASSIUM CHLORIDE CRYS ER 20 MEQ PO TBCR
40.0000 meq | EXTENDED_RELEASE_TABLET | Freq: Once | ORAL | Status: AC
  Administered 2017-09-26: 40 meq via ORAL
  Filled 2017-09-26: qty 2

## 2017-09-26 MED ORDER — RAMIPRIL 5 MG PO CAPS
5.0000 mg | ORAL_CAPSULE | Freq: Every day | ORAL | Status: DC
  Administered 2017-09-26 – 2017-09-27 (×2): 5 mg via ORAL
  Filled 2017-09-26 (×2): qty 1

## 2017-09-26 MED ORDER — FUROSEMIDE 10 MG/ML IJ SOLN
40.0000 mg | Freq: Two times a day (BID) | INTRAMUSCULAR | Status: DC
  Administered 2017-09-26 – 2017-09-27 (×3): 40 mg via INTRAVENOUS
  Filled 2017-09-26 (×3): qty 4

## 2017-09-26 MED ORDER — ENOXAPARIN SODIUM 60 MG/0.6ML ~~LOC~~ SOLN
50.0000 mg | SUBCUTANEOUS | Status: DC
  Administered 2017-09-26 – 2017-09-27 (×2): 50 mg via SUBCUTANEOUS
  Filled 2017-09-26: qty 0.6
  Filled 2017-09-26: qty 0.5

## 2017-09-26 MED ORDER — POTASSIUM CHLORIDE CRYS ER 20 MEQ PO TBCR
20.0000 meq | EXTENDED_RELEASE_TABLET | Freq: Two times a day (BID) | ORAL | Status: DC
  Administered 2017-09-26 – 2017-09-27 (×2): 20 meq via ORAL
  Filled 2017-09-26 (×2): qty 1

## 2017-09-26 MED ORDER — DILTIAZEM HCL ER COATED BEADS 240 MG PO CP24
240.0000 mg | ORAL_CAPSULE | Freq: Every day | ORAL | Status: DC
  Administered 2017-09-26 – 2017-09-27 (×2): 240 mg via ORAL
  Filled 2017-09-26 (×2): qty 1

## 2017-09-26 NOTE — Progress Notes (Signed)
*  PRELIMINARY RESULTS* Echocardiogram 2D Echocardiogram has been performed.  Jeryl Columbialliott, Chidera Dearcos 09/26/2017, 1:19 PM

## 2017-09-26 NOTE — Progress Notes (Signed)
42 yo female with htn non smoker admitted this am with sob and chest pain.she was found to have mild fluid overload pulmonary edema.treated with lasix.echo normal EF.cardiac enzymes negative.monitor tele.

## 2017-09-26 NOTE — Progress Notes (Signed)
PHARMACY - ENOXAPARIN  Pharmacy has been asked to adjust enoxaparin (lovenox) dosing as needed  in this patient for DVT prophylaxis.    Height: 65 inches Weight: 104.3kg BMI: 38 CrCl: 112 ml/min H/H: 13.2/39.6 PLTC: 280  Assessment: BMI > 30 and CrCl > 4630ml/min, therefore will adjust Enoxparin dose to 0.5 mg/kg/q24h per manufacturer recommendations  Plan: Adjust Enoxaparin to 50mg  sq q24h  Thank you, Terrilee FilesLeann Terrianne Cavness, PharmD

## 2017-09-26 NOTE — H&P (Signed)
History and Physical    Lydia Thompson:[id number] DOB: 03-17-1976 DOA: 09/25/2017  PCP: Patient, No Pcp Per   Patient coming from: Home  Chief Complaint: SOB, Chest Pain.  HPI: Lydia Thompson is a 42 y.o. female with medical history significant for Hypertension, who presented to the ED with complaints of chest pain, shortness of breath that started last night-Monday night 2/3, and Palpitations intermittent over the past 6-7 months.  Patient reports she was sleeping last night, when she suddenly woke up feeling short of breath also with chest pain.  Patient uses one pillow to sleep, SOB improved when she sat up for a while.  Chest pain is at the center of her chest, nonradiating, described as a pressure-like and sharp.  No family history of premature coronary artery disease.  Non-smoker.  No personal or family history of blood clots. Patient reports palpitations that started when she is at rest and self resolve.  Patient reports intermittent leg swelling, she was prescribed Lasix which she takes daily, about a year ago, had refills.  No longer has a PCP.  She cannot tell if she has gained weight. No fever no cough no chills, no dysuria frequency no diarrhea vomiting abdominal pain.   ED Course: Stable vitals mild intermittent tachycardia to 109, sats >93% on room air. WBC elevated at 16.9.  Potassium mildly low at 3.4.  BNP elevated 264.  CTA chest negative for PE, suggestive of early CHF, pulmonary artery hypertension.  Cardiology was called in ED  with recommendations for hospitalist admission, they can see in consult.  IV Lasix 40 mg given X1.  Times called to admit for further workup of volume overload and chest pain.  Review of Systems: As per HPI otherwise 10 point review of systems negative.  Past Medical History:  Diagnosis Date  . Hypertension     Past Surgical History:  Procedure Laterality Date  . ABDOMINAL HYSTERECTOMY      reports that  has never smoked. she has never used  smokeless tobacco. She reports that she does not drink alcohol or use drugs.  No Known Allergies  Family History  Problem Relation Age of Onset  . Hypertension Mother   . Heart disease Paternal Uncle    Prior to Admission medications   Medication Sig Start Date End Date Taking? Authorizing Provider  BLACK COHOSH PO Take 1 tablet by mouth 2 (two) times daily.   Yes [provider]  furosemide (LASIX) 20 MG tablet TAKE 1 TABLET BY MOUTH EVERY DAY FOR EDEMA 08/20/17  Yes [provider]    Physical Exam: Vitals:   09/25/17 2335 09/26/17 0000 09/26/17 0015 09/26/17 0030  BP: 123/69     Pulse: 84 86 76 85  Resp: 17 20 18 16   Temp:      TempSrc:      SpO2: 97% 97% 100% 97%  Weight:      Height:        Constitutional: NAD, calm, comfortable Vitals:   09/25/17 2335 09/26/17 0000 09/26/17 0015 09/26/17 0030  BP: 123/69     Pulse: 84 86 76 85  Resp: 17 20 18 16   Temp:      TempSrc:      SpO2: 97% 97% 100% 97%  Weight:      Height:       Eyes: PERRL, lids and conjunctivae normal ENMT: Mucous membranes are moist. Posterior pharynx clear of any exudate or lesions.Normal dentition.  Neck: normal, supple, no masses,  no thyromegaly. JVD not appreciated. Respiratory: clear to auscultation bilaterally, no wheezing, no crackles. Normal respiratory effort. No accessory muscle use.  Cardiovascular: Regular rate and rhythm, 3/6 systolic murmur, appreciated more in right Upper sternal border . No pedal edema. Abdomen: no tenderness, no masses palpated. No hepatosplenomegaly. Bowel sounds positive.  Musculoskeletal: no clubbing / cyanosis. No joint deformity upper and lower extremities. Good ROM, no contractures. Normal muscle tone.  Skin: no rashes, lesions, ulcers. No induration Neurologic: CN 2-12 grossly intact. Sensation intact, DTR normal. Strength 5/5 in all 4.  Psychiatric: Normal judgment and insight. Alert and oriented x 3. Normal mood.   Labs on Admission: I  have personally reviewed following labs and imaging studies  CBC: Recent Labs  Lab 09/25/17 1307  WBC 16.9*  HGB 13.2  HCT 39.6  MCV 78.9  PLT 280   Basic Metabolic Panel: Recent Labs  Lab 09/25/17 1307  NA 141  K 3.4*  CL 107  CO2 28  GLUCOSE 94  BUN 19  CREATININE 0.73  CALCIUM 9.1   Urine analysis:    Component Value Date/Time   COLORURINE STRAW (A) 11/02/2009 2135   APPEARANCEUR CLEAR 11/02/2009 2135   LABSPEC 1.010 11/02/2009 2135   PHURINE 5.5 11/02/2009 2135   GLUCOSEU NEGATIVE 11/02/2009 2135   HGBUR NEGATIVE 11/02/2009 2135   BILIRUBINUR neg 01/13/2015 1127   KETONESUR NEGATIVE 11/02/2009 2135   PROTEINUR neg 01/13/2015 1127   PROTEINUR NEGATIVE 11/02/2009 2135   UROBILINOGEN 0.2 01/13/2015 1127   UROBILINOGEN 0.2 11/02/2009 2135   NITRITE positive 01/13/2015 1127   NITRITE NEGATIVE 11/02/2009 2135   LEUKOCYTESUR small (1+) 01/13/2015 1127    Radiological Exams on Admission: Dg Chest 2 View  Result Date: 09/25/2017 CLINICAL DATA:  Mid chest pain and shortness of breath for the past week. Increasing severity last night. History of hypertension, nonsmoker. EXAM: CHEST  2 VIEW COMPARISON:  None in PACs FINDINGS: The lungs are adequately inflated. The interstitial markings are mildly increased bilaterally. There is no alveolar infiltrate or pleural effusion. The heart is normal in size. The central pulmonary vascularity is mildly prominent. The trachea is midline. The bony thorax exhibits no acute abnormality. IMPRESSION: Mild interstitial prominence bilaterally may reflect interstitial pneumonia or possibly mild interstitial edema in the appropriate clinical setting. No alveolar pneumonia. Electronically Signed   By: David  [place]aziland[place] M.D.   On: 09/25/2017 12:26   Ct Angio Chest Pe W/cm &/or Wo Cm  Result Date: 09/25/2017 CLINICAL DATA:  Shortness of breath and cardiac arrhythmia EXAM: CT ANGIOGRAPHY CHEST WITH CONTRAST TECHNIQUE: Multidetector CT imaging of the  chest was performed using the standard protocol during bolus administration of intravenous contrast. Multiplanar CT image reconstructions and MIPs were obtained to evaluate the vascular anatomy. CONTRAST:  100mL ISOVUE-300 IOPAMIDOL (ISOVUE-300) INJECTION 61% COMPARISON:  September 25, 2017 chest radiograph FINDINGS: Cardiovascular: There is no demonstrable pulmonary embolus. There is no thoracic aortic aneurysm or dissection. Visualized great vessels appear normal. Note that the right and left common carotid arteries arise as a common trunk, an anatomic variant. There is no appreciable pericardial effusion or pericardial thickening. There is mild left ventricular hypertrophy. There is prominence of the main pulmonary outflow tract with a measured diameter of 3.6 cm, a finding felt to be indicative of a degree of pulmonary arterial hypertension. Mediastinum/Nodes: Visualized thyroid appears normal. There is no appreciable thoracic adenopathy. No esophageal lesions are evident. Lungs/Pleura: There are small pleural effusions bilaterally. There is patchy mosaic attenuation bilaterally, likely representing  small airways obstructive type disease with atelectasis. A mild degree of superimposed interstitial pulmonary edema is questioned. There is no well-defined airspace consolidation. Upper Abdomen: Visualized upper abdominal structures appear unremarkable. Musculoskeletal: There are no blastic or lytic bone lesions. Review of the MIP images confirms the above findings. IMPRESSION: 1.  No demonstrable pulmonary embolus. 2. Small pleural effusions with suspected degree of pulmonary edema. There may be early congestive heart failure. 3. There are areas of atelectatic change. There may be a degree of small airways obstructive disease. The appearance does suggest that there may be a combination of interstitial edema and small airways obstruction. There is no frank consolidation. 4. Prominence of the main pulmonary outflow  tract, a finding felt to be indicative of pulmonary arterial hypertension. 5.  No evident thoracic adenopathy. Electronically Signed   By: Bretta Bang III M.D.   On: 09/25/2017 20:33    EKG: Independently reviewed.  Sinus rhythm, Qtc- 423, QRS and P wave suggestive of LVH and atrial enlargement  Assessment/Plan Principal Problem:   SOB (shortness of breath) Active Problems:   HTN (hypertension)   Chest pain  Shortness of breath-likely secondary to mild CHF, suggested by x-ray and CT chest, likely possible pulmonary hypertension also.  BNP mildly elevated at 264 but patient Obese. IV lasix X1 given in ED. - IV Lasix 40 twice daily - Echo - Strict input/output - Daily weight - Fluid restrict 1.5L daily. - BMP a.m  Chest Pain- Not exactly typical of ACS.  Differentials- ACS, valvular disease, pulmonary hypertension risk factors- HTN, obesity. Non smoker, no family hx of premature CAD. - Trend Troponins - EKG a.m - Echo -Lipid panel a.m  Leukocytosis- 16.9.  At this time no focus of infection identified. No  Fever.  Last WBC 7 years ago was elevated at 14.9, at that time she had abdominal symptoms suggestive of gastroenteritis. -Trend, cbc a.m  Cardiac murmur- - Echo  HTN- Bp stable.  Reports using Lasix 20 mg daily for blood pressure control.  Mild hypokalemia- 3.4 - Replete - Mag check- 2.0.  HIV check as part of routine health screening.  DVT prophylaxis: Lovenox Code Status: Full  Family Communication:None at bedside  Disposition Plan: 2-3 day s  Consults called: None  Admission status: Inpt, tele    Onnie Boer MD [facility] Pager [phone]  If 6PM-4AM, please contact night-coverage [email or web address] Password [email or web address]  09/26/2017, 1:14 AM

## 2017-09-26 NOTE — Consult Note (Signed)
Reason for Consult:Chest pain and dyspnea Referring Physician: Gladstone Pih, MD  Lydia Thompson is an 42 y.o. female.  HPI: She has history of hypertension, noncompliance and was previously been followed at [facility], but was fired from the practice due to noncompliance. She is a nonsmoker, had been doing well until about a month ago started noticing worsening dyspnea on exertion and also shortness of breath when laying in bed, with episodes of orthopnea and PND. She has chronic mild leg edema for which she takes furosemide. No hemoptysis.  2 days ago she also developed midsternal chest pain described as tightness in the middle of the chest worse with taking deep breath. Hence she presented to the emergency room yesterday. She was ruled out for pulmonary embolism and also myocardial infarction, she was treated for congestive heart failure and we were consulted for follow-up.  States that her dyspnea has improved since being in the hospital. She has not had any further recurrence of chest pain. Her husband is present at the bedside.  Past Medical History:  Diagnosis Date  . Hypertension     Past Surgical History:  Procedure Laterality Date  . ABDOMINAL HYSTERECTOMY      Family History  Problem Relation Age of Onset  . Hypertension Mother   . Heart disease Paternal Uncle     Social History:  reports that  has never smoked. she has never used smokeless tobacco. She reports that she does not drink alcohol or use drugs.  Allergies: No Known Allergies  Results for orders placed or performed during the hospital encounter of 09/25/17 (from the past 48 hour(s))  Basic metabolic panel     Status: Abnormal   Collection Time: 09/25/17  1:07 PM  Result Value Ref Range   Sodium 141 135 - 145 mmol/L   Potassium 3.4 (L) 3.5 - 5.1 mmol/L   Chloride 107 101 - 111 mmol/L   CO2 28 22 - 32 mmol/L   Glucose, Bld 94 65 - 99 mg/dL   BUN 19 6 - 20 mg/dL   Creatinine, Ser 0.73  0.44 - 1.00 mg/dL   Calcium 9.1 8.9 - 10.3 mg/dL   GFR calc non Af Amer >60 >60 mL/min   GFR calc Af Amer >60 >60 mL/min    Comment: (NOTE) The eGFR has been calculated using the CKD EPI equation. This calculation has not been validated in all clinical situations. eGFR's persistently <60 mL/min signify possible Chronic Kidney Disease.    Anion gap 6 5 - 15    Comment: Performed at [facility], [place] [address] [place], [place] [address]  CBC     Status: Abnormal   Collection Time: 09/25/17  1:07 PM  Result Value Ref Range   WBC 16.9 (H) 4.0 - 10.5 K/uL   RBC 5.02 3.87 - 5.11 MIL/uL   Hemoglobin 13.2 12.0 - 15.0 g/dL   HCT 39.6 36.0 - 46.0 %   MCV 78.9 78.0 - 100.0 fL   MCH 26.3 26.0 - 34.0 pg   MCHC 33.3 30.0 - 36.0 g/dL   RDW 14.1 11.5 - 15.5 %   Platelets 280 150 - 400 K/uL    Comment: Performed at [facility], [place] [address], [place], [place] [address]  Brain natriuretic peptide     Status: Abnormal   Collection Time: 09/25/17  1:07 PM  Result Value Ref Range   B Natriuretic Peptide 264.2 (H) 0.0 - 100.0 pg/mL    Comment: Performed  at [facility], [place] [address] [place], [place] [address]  I-stat troponin, ED     Status: None   Collection Time: 09/25/17  1:17 PM  Result Value Ref Range   Troponin i, poc 0.00 0.00 - 0.08 ng/mL   Comment 3            Comment: Due to the release kinetics of cTnI, a negative result within the first hours of the onset of symptoms does not rule out myocardial infarction with certainty. If myocardial infarction is still suspected, repeat the test at appropriate intervals.   I-Stat beta hCG blood, ED     Status: None   Collection Time: 09/25/17  1:18 PM  Result Value Ref Range   I-stat hCG, quantitative <5.0 <5 mIU/mL   Comment 3            Comment:   GEST. AGE      CONC.  (mIU/mL)   <=1 WEEK        5 - 50     2 WEEKS       50 - 500     3 WEEKS       100 - 10,000     4 WEEKS      1,000 - 30,000        FEMALE AND NON-PREGNANT FEMALE:     LESS THAN 5 mIU/mL   I-stat troponin, ED     Status: None   Collection Time: 09/25/17  7:48 PM  Result Value Ref Range   Troponin i, poc 0.00 0.00 - 0.08 ng/mL   Comment 3            Comment: Due to the release kinetics of cTnI, a negative result within the first hours of the onset of symptoms does not rule out myocardial infarction with certainty. If myocardial infarction is still suspected, repeat the test at appropriate intervals.   Troponin I (q 6hr x 3)     Status: None   Collection Time: 09/26/17  2:06 AM  Result Value Ref Range   Troponin I <0.03 <0.03 ng/mL    Comment: Performed at [facility], [place] [address] [place], [place] [address]  Magnesium     Status: None   Collection Time: 09/26/17  2:06 AM  Result Value Ref Range   Magnesium 2.0 1.7 - 2.4 mg/dL    Comment: Performed at [facility], [place] [address], [place], [place] [address]  HIV antibody (Routine Testing)     Status: None   Collection Time: 09/26/17  3:14 AM  Result Value Ref Range   HIV Screen 4th Generation wRfx Non Reactive Non Reactive    Comment: (NOTE) Performed At: [facility] [place], [place] [id number] Rush Farmer MD IB:[id number] Performed at [facility], [place] [address] [place], [place] [address]   Troponin I (q 6hr x 3)     Status: None   Collection Time: 09/26/17  7:28 AM  Result Value Ref Range   Troponin I <0.03 <0.03 ng/mL    Comment: Performed at [facility], [place] [address], [place], [place] [address]  Lipid panel     Status: Abnormal   Collection Time: 09/26/17  7:28 AM  Result Value Ref Range   Cholesterol 163 0 - 200 mg/dL   Triglycerides 55 <150 mg/dL   HDL 40 (L) >40 mg/dL   Total CHOL/HDL Ratio 4.1 RATIO   VLDL 11 0 -  40 mg/dL   LDL Cholesterol 112 (H) 0 - 99 mg/dL    Comment:        Total Cholesterol/HDL:CHD  Risk Coronary Heart Disease Risk Table                     Men   Women  1/2 Average Risk   3.4   3.3  Average Risk       5.0   4.4  2 X Average Risk   9.6   7.1  3 X Average Risk  23.4   11.0        Use the calculated Patient Ratio above and the CHD Risk Table to determine the patient's CHD Risk.        ATP III CLASSIFICATION (LDL):  <100     mg/dL   Optimal  100-129  mg/dL   Near or Above                    Optimal  130-159  mg/dL   Borderline  160-189  mg/dL   High  >190     mg/dL   Very High Performed at [place] [address], [place], [place] [address]   Basic metabolic panel     Status: Abnormal   Collection Time: 09/26/17  7:28 AM  Result Value Ref Range   Sodium 138 135 - 145 mmol/L   Potassium 3.7 3.5 - 5.1 mmol/L   Chloride 105 101 - 111 mmol/L   CO2 24 22 - 32 mmol/L   Glucose, Bld 100 (H) 65 - 99 mg/dL   BUN 14 6 - 20 mg/dL   Creatinine, Ser 0.69 0.44 - 1.00 mg/dL   Calcium 8.8 (L) 8.9 - 10.3 mg/dL   GFR calc non Af Amer >60 >60 mL/min   GFR calc Af Amer >60 >60 mL/min    Comment: (NOTE) The eGFR has been calculated using the CKD EPI equation. This calculation has not been validated in all clinical situations. eGFR's persistently <60 mL/min signify possible Chronic Kidney Disease.    Anion gap 9 5 - 15    Comment: Performed at [facility], [place] [address], [place], [place] [address]  Troponin I (q 6hr x 3)     Status: None   Collection Time: 09/26/17  3:45 PM  Result Value Ref Range   Troponin I <0.03 <0.03 ng/mL    Comment: Performed at [facility], [place] [address], [place], [place] [address]    Dg Chest 2 View  Result Date: 09/25/2017 CLINICAL DATA:  Mid chest pain and shortness of breath for the past week. Increasing severity last night. History of hypertension, nonsmoker. EXAM: CHEST  2 VIEW COMPARISON:  None in PACs FINDINGS: The lungs are adequately inflated. The interstitial markings  are mildly increased bilaterally. There is no alveolar infiltrate or pleural effusion. The heart is normal in size. The central pulmonary vascularity is mildly prominent. The trachea is midline. The bony thorax exhibits no acute abnormality. IMPRESSION: Mild interstitial prominence bilaterally may reflect interstitial pneumonia or possibly mild interstitial edema in the appropriate clinical setting. No alveolar pneumonia. Electronically Signed   By: David  Martinique M.D.   On: 09/25/2017 12:26   Ct Angio Chest Pe W/cm &/or Wo Cm  Result Date: 09/25/2017 CLINICAL DATA:  Shortness of breath and cardiac arrhythmia EXAM: CT ANGIOGRAPHY CHEST WITH CONTRAST TECHNIQUE: Multidetector CT imaging of the chest was performed using the standard protocol  during bolus administration of intravenous contrast. Multiplanar CT image reconstructions and MIPs were obtained to evaluate the vascular anatomy. CONTRAST:  163m ISOVUE-300 IOPAMIDOL (ISOVUE-300) INJECTION 61% COMPARISON:  September 25, 2017 chest radiograph FINDINGS: Cardiovascular: There is no demonstrable pulmonary embolus. There is no thoracic aortic aneurysm or dissection. Visualized great vessels appear normal. Note that the right and left common carotid arteries arise as a common trunk, an anatomic variant. There is no appreciable pericardial effusion or pericardial thickening. There is mild left ventricular hypertrophy. There is prominence of the main pulmonary outflow tract with a measured diameter of 3.6 cm, a finding felt to be indicative of a degree of pulmonary arterial hypertension. Mediastinum/Nodes: Visualized thyroid appears normal. There is no appreciable thoracic adenopathy. No esophageal lesions are evident. Lungs/Pleura: There are small pleural effusions bilaterally. There is patchy mosaic attenuation bilaterally, likely representing small airways obstructive type disease with atelectasis. A mild degree of superimposed interstitial pulmonary edema is  questioned. There is no well-defined airspace consolidation. Upper Abdomen: Visualized upper abdominal structures appear unremarkable. Musculoskeletal: There are no blastic or lytic bone lesions. Review of the MIP images confirms the above findings. IMPRESSION: 1.  No demonstrable pulmonary embolus. 2. Small pleural effusions with suspected degree of pulmonary edema. There may be early congestive heart failure. 3. There are areas of atelectatic change. There may be a degree of small airways obstructive disease. The appearance does suggest that there may be a combination of interstitial edema and small airways obstruction. There is no frank consolidation. 4. Prominence of the main pulmonary outflow tract, a finding felt to be indicative of pulmonary arterial hypertension. 5.  No evident thoracic adenopathy. Electronically Signed   By: WLowella GripIII M.D.   On: 09/25/2017 20:33    Review of Systems  Constitutional: Negative.   HENT: Negative.   Eyes: Negative.   Respiratory: Positive for shortness of breath. Negative for cough, hemoptysis, sputum production and wheezing.   Cardiovascular: Positive for chest pain, palpitations, orthopnea, leg swelling and PND. Negative for claudication.  Gastrointestinal: Negative.   Genitourinary: Negative.   Musculoskeletal: Negative.   Skin: Negative.   Neurological: Negative.   Endo/Heme/Allergies: Negative.   Psychiatric/Behavioral: Negative.   All other systems reviewed and are negative.  Blood pressure (!) 142/86, pulse 87, temperature 98.6 F (37 C), temperature source Oral, resp. rate 20, height _0  (1.651 m), weight 103.4 kg (228 lb), SpO2 99 %. Body mass index is 37.94 kg/m.  Physical Exam  Constitutional: She is oriented to person, place, and time.  Moderately build and moderately obese in NAD.  HENT:  Head: Atraumatic.  Eyes: Conjunctivae are normal.  Neck: Normal range of motion. Neck supple.  Cardiovascular: Intact distal pulses.   S1 is normal, S2 is accentuated. It is palpable. PMI is  Forcible, no gallop appreciated. Midsystolic murmur at the apex 2/6 in intensity, 2/6 systolic ejection murmur in the pulmonary area.  Respiratory: Effort normal and breath sounds normal.  GI: Soft. Bowel sounds are normal.  Obese with pannus present  Musculoskeletal: Normal range of motion. She exhibits no edema.  Neurological: She is alert and oriented to person, place, and time.  Skin: Skin is warm and dry.  Psychiatric: She has a normal mood and affect.   Cardiac studies: Echocardiogram 09/26/2017: - Left ventricle: The cavity size was normal. There was severe  concentric hypertrophy. Systolic function was vigorous. The  estimated ejection fraction was in the range of 65% to 70%. There  was no dynamic obstruction.  Wall motion was normal; there were no  regional wall motion abnormalities. Features are consistent with  a pseudonormal left ventricular filling pattern, with concomitant  abnormal relaxation and increased filling pressure (grade 2  diastolic dysfunction). - Mitral valve: There was systolic anterior motion. There was  moderate regurgitation directed posteriorly. - Left atrium: The atrium was severely dilated. Volume/bsa, S: 54  ml/m^2.  EKG 09/26/2017: Normal sinus rhythm, biatrial enlargement, LVH with repolarization abnormality, cannot exclude inferolateral ischemia. Abnormal EKG.  Scheduled Meds: . enoxaparin (LOVENOX) injection  50 mg Subcutaneous Q24H  . furosemide  40 mg Intravenous Q12H  . potassium chloride  20 mEq Oral BID   Continuous Infusions: PRN Meds:. Home Meds Current Meds  Medication Sig  . BLACK COHOSH PO Take 1 tablet by mouth 2 (two) times daily.  . furosemide (LASIX) 20 MG tablet TAKE 1 TABLET BY MOUTH EVERY DAY FOR EDEMA    Assessment/Plan: 1. Muscular skeletal chest pain 2. Shortness of breath and dyspnea on exertion due to acute on chronic diastolic heart failure and pulmonary  hypertension. She has palpable P2, second heart sound, P2 component is also accentuated. 3. Acute on chronic diastolic heart failure 4. 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 5. Moderate posteriorly directed MR secondary to [facility], hypertrophic cardiopathy need to be excluded, probably unlikely and may represent hypertensive heart disease. 6. Moderate obesity  Recommendation: Patient symptoms of dyspnea is improved. Continue diuresis, she admits to eating unhealthy food including she loves eating salty food. I had a very lengthy discussion with the patient and her husband at the bedside regarding congestive heart failure.  I'm also concerned about pulmonary hypertension. Once she recuperates from acute heart failure, she may need further cardiac workup including stress testing and probably repeat echocardiogram in 3-6 months to follow-up on pulmonary hypertension and RV strain. Weight loss was extensively discussed.  I will start the patient on diltiazem CD 240 mg daily along with starting her on ACE inhibitor Altase 5 mg daily, can be discharged home on these 2 medications along with chlorthalidone 25 mg in the morning, I will consider adding Spironolactone in the outpatient basis to improve diastolic compliance. She can take furosemide on a when necessary basis only and not on a regular basis. Avoidance of salt extensively discussed as did 8 at about discussed above.   Adrian Prows, MD 09/26/2017, 7:17 PM [facility] [place] Pager: [phone] Office: [phone] If no answer: Cell:  [phone]

## 2017-09-27 DIAGNOSIS — R0602 Shortness of breath: Secondary | ICD-10-CM

## 2017-09-27 LAB — BASIC METABOLIC PANEL
Anion gap: 7 (ref 5–15)
BUN: 20 mg/dL (ref 6–20)
CALCIUM: 9.3 mg/dL (ref 8.9–10.3)
CO2: 29 mmol/L (ref 22–32)
CREATININE: 1.04 mg/dL — AB (ref 0.44–1.00)
Chloride: 103 mmol/L (ref 101–111)
Glucose, Bld: 100 mg/dL — ABNORMAL HIGH (ref 65–99)
Potassium: 4.1 mmol/L (ref 3.5–5.1)
SODIUM: 139 mmol/L (ref 135–145)

## 2017-09-27 LAB — BRAIN NATRIURETIC PEPTIDE: B NATRIURETIC PEPTIDE 5: 33.1 pg/mL (ref 0.0–100.0)

## 2017-09-27 MED ORDER — DILTIAZEM HCL ER COATED BEADS 240 MG PO CP24: 240.0000 mg | ORAL_CAPSULE | Freq: Every day | ORAL | 0 refills | Status: DC

## 2017-09-27 MED ORDER — RAMIPRIL 5 MG PO CAPS: 5.0000 mg | ORAL_CAPSULE | Freq: Every day | ORAL | 0 refills | Status: DC

## 2017-09-27 MED ORDER — CHLORTHALIDONE 25 MG PO TABS: 25.0000 mg | ORAL_TABLET | Freq: Every day | ORAL | 0 refills | Status: DC

## 2017-09-27 MED ORDER — FUROSEMIDE 20 MG PO TABS: 20.0000 mg | ORAL_TABLET | Freq: Every day | ORAL | 0 refills | Status: DC | PRN

## 2017-09-27 NOTE — Progress Notes (Signed)
Patient dicharged home, discharge instructions reviewed with patient.

## 2017-09-27 NOTE — Discharge Summary (Signed)
Discharge Summary  Lydia Thompson ZOX:[id number] DOB: 26-Aug-1975  PCP: Patient, No Pcp Per  Admit date: 09/25/2017 Discharge date: 09/27/2017  Time spent: <45mins  Recommendations for Outpatient Follow-up:  1. F/u with PMD within a week  for hospital discharge follow up, repeat cbc/bmp at follow up. pmd to consider outpatient sleep study 2. F/u with Cardiology Dr Jacinto Halim  Discharge Diagnoses:  Active Hospital Problems   Diagnosis Date Noted  . SOB (shortness of breath) 09/26/2017  . HTN (hypertension) 09/26/2017  . Chest pain 09/26/2017    Resolved Hospital Problems  No resolved problems to display.    Discharge Condition: stable  Diet recommendation: heart healthy  Filed Weights   09/25/17 1819 09/26/17 1550 09/27/17 0559  Weight: 104.3 kg (230 lb) 103.4 kg (228 lb) 99.6 kg (219 lb 9.3 oz)    History of present illness:  Patient coming from: Home  Chief Complaint: SOB, Chest Pain.  HPI: Lydia Thompson is a 42 y.o. female with medical history significant for Hypertension, who presented to the ED with complaints of chest pain, shortness of breath that started last night-Monday night 2/3, and Palpitations intermittent over the past 6-7 months.  Patient reports she was sleeping last night, when she suddenly woke up feeling short of breath also with chest pain.  Patient uses one pillow to sleep, SOB improved when she sat up for a while.  Chest pain is at the center of her chest, nonradiating, described as a pressure-like and sharp.  No family history of premature coronary artery disease.  Non-smoker.  No personal or family history of blood clots. Patient reports palpitations that started when she is at rest and self resolve.  Patient reports intermittent leg swelling, she was prescribed Lasix which she takes daily, about a year ago, had refills.  No longer has a PCP.  She cannot tell if she has gained weight. No fever no cough no chills, no dysuria frequency no diarrhea vomiting  abdominal pain.   ED Course: Stable vitals mild intermittent tachycardia to 109, sats >93% on room air. WBC elevated at 16.9.  Potassium mildly low at 3.4.  BNP elevated 264.  CTA chest negative for PE, suggestive of early CHF, pulmonary artery hypertension.  Cardiology was called in ED  with recommendations for hospitalist admission, they can see in consult.  IV Lasix 40 mg given X1.  Times called to admit for further workup of volume overload and chest pain.    Hospital Course:  Principal Problem:   SOB (shortness of breath) Active Problems:   HTN (hypertension)   Chest pain  Shortness of breath and dyspnea on exertion due to acute on chronic diastolic heart failure and pulmonary hypertension -she presented to the hospital due to sob and chest pain, troponin negative. ekg no acute changes. -cta negative for PE , did show "Small pleural effusions with suspected degree of pulmonary edema" -echocardiogram confirmed diastolic dysfunction and pulmonary hypertension. -patient received iv lasix, symptom improved. -cardiology Dr Jacinto Halim consulted who recommended "diltiazem CD 240 mg daily along with starting her on ACE inhibitor Altase 5 mg daily, can be discharged home on these 2 medications along with chlorthalidone 25 mg in the morning, I will consider adding Spironolactone in the outpatient basis to improve diastolic compliance. She can take furosemide on a when necessary basis only and not on a regular basis. Avoidance of salt extensively " "Once she recuperates from acute heart failure, she may need further cardiac workup including stress testing and probably repeat  echocardiogram in 3-6 months to follow-up on pulmonary hypertension and RV strain. Weight loss was extensively discussed."  Leukocytosis: likely reactive, no source of infection, she does not appear septic. No fever. pmd to follow up.   Body mass index is 36.54 kg/m. encourage weight  loss.    Procedures:  none  Consultations:  Cardiology Dr Jacinto Halim  Discharge Exam: BP 113/72 (BP Location: Left Arm)   Pulse 60   Temp 97.7 F (36.5 C) (Oral)   Resp 20   Ht 5\' 5"  (1.651 m)   Wt 99.6 kg (219 lb 9.3 oz)   SpO2 97%   BMI 36.54 kg/m   General: NAD Cardiovascular: RRR, 2/6 systolic murmur at p2 area Respiratory: CTABL  Discharge Instructions You were cared for by a hospitalist during your hospital stay. If you have any questions about your discharge medications or the care you received while you were in the hospital after you are discharged, you can call the unit and asked to speak with the hospitalist on call if the hospitalist that took care of you is not available. Once you are discharged, your primary care physician will handle any further medical issues. Please note that NO REFILLS for any discharge medications will be authorized once you are discharged, as it is imperative that you return to your primary care physician (or establish a relationship with a primary care physician if you do not have one) for your aftercare needs so that they can reassess your need for medications and monitor your lab values.  Discharge Instructions    Diet - low sodium heart healthy   Complete by:  As directed    Increase activity slowly   Complete by:  As directed      Allergies as of 09/27/2017   No Known Allergies     Medication List    TAKE these medications   BLACK COHOSH PO Take 1 tablet by mouth 2 (two) times daily.   chlorthalidone 25 MG tablet Commonly known as:  HYGROTON Take 1 tablet (25 mg total) by mouth daily.   diltiazem 240 MG 24 hr capsule Commonly known as:  CARDIZEM CD Take 1 capsule (240 mg total) by mouth daily. Start taking on:  09/28/2017   furosemide 20 MG tablet Commonly known as:  LASIX Take 1 tablet (20 mg total) by mouth daily as needed for fluid or edema. What changed:  See the new instructions.   ramipril 5 MG capsule Commonly known  as:  ALTACE Take 1 capsule (5 mg total) by mouth daily. Start taking on:  09/28/2017      No Known Allergies Follow-up Information    Yates Decamp, MD Follow up in 1 week(s).   Specialty:  Cardiology Why:  hospital discharge follow up Contact information: [address] [place] [address] [phone]        [facility] Follow up on 10/18/2017.   Specialty:  Internal Medicine Why:  Appointment 2/27 at 1:00 PM. Please keep appointment, take all medications with you.  please to discuss with pmd about sleep study and weight loss program. Contact information: [address] [place] [address] [phone]           The results of significant diagnostics from this hospitalization (including imaging, microbiology, ancillary and laboratory) are listed below for reference.    Significant Diagnostic Studies: Dg Chest 2 View  Result Date: 09/25/2017 CLINICAL DATA:  Mid chest pain and shortness of breath  for the past week. Increasing severity last night. History of hypertension, nonsmoker. EXAM: CHEST  2 VIEW COMPARISON:  None in PACs FINDINGS: The lungs are adequately inflated. The interstitial markings are mildly increased bilaterally. There is no alveolar infiltrate or pleural effusion. The heart is normal in size. The central pulmonary vascularity is mildly prominent. The trachea is midline. The bony thorax exhibits no acute abnormality. IMPRESSION: Mild interstitial prominence bilaterally may reflect interstitial pneumonia or possibly mild interstitial edema in the appropriate clinical setting. No alveolar pneumonia. Electronically Signed   By: David  SwazilandJordan M.D.   On: 09/25/2017 12:26   Ct Angio Chest Pe W/cm &/or Wo Cm  Result Date: 09/25/2017 CLINICAL DATA:  Shortness of breath and cardiac arrhythmia EXAM: CT ANGIOGRAPHY CHEST WITH CONTRAST TECHNIQUE: Multidetector CT imaging of the chest was performed using the standard protocol  during bolus administration of intravenous contrast. Multiplanar CT image reconstructions and MIPs were obtained to evaluate the vascular anatomy. CONTRAST:  100mL ISOVUE-300 IOPAMIDOL (ISOVUE-300) INJECTION 61% COMPARISON:  September 25, 2017 chest radiograph FINDINGS: Cardiovascular: There is no demonstrable pulmonary embolus. There is no thoracic aortic aneurysm or dissection. Visualized great vessels appear normal. Note that the right and left common carotid arteries arise as a common trunk, an anatomic variant. There is no appreciable pericardial effusion or pericardial thickening. There is mild left ventricular hypertrophy. There is prominence of the main pulmonary outflow tract with a measured diameter of 3.6 cm, a finding felt to be indicative of a degree of pulmonary arterial hypertension. Mediastinum/Nodes: Visualized thyroid appears normal. There is no appreciable thoracic adenopathy. No esophageal lesions are evident. Lungs/Pleura: There are small pleural effusions bilaterally. There is patchy mosaic attenuation bilaterally, likely representing small airways obstructive type disease with atelectasis. A mild degree of superimposed interstitial pulmonary edema is questioned. There is no well-defined airspace consolidation. Upper Abdomen: Visualized upper abdominal structures appear unremarkable. Musculoskeletal: There are no blastic or lytic bone lesions. Review of the MIP images confirms the above findings. IMPRESSION: 1.  No demonstrable pulmonary embolus. 2. Small pleural effusions with suspected degree of pulmonary edema. There may be early congestive heart failure. 3. There are areas of atelectatic change. There may be a degree of small airways obstructive disease. The appearance does suggest that there may be a combination of interstitial edema and small airways obstruction. There is no frank consolidation. 4. Prominence of the main pulmonary outflow tract, a finding felt to be indicative of pulmonary  arterial hypertension. 5.  No evident thoracic adenopathy. Electronically Signed   By: Bretta BangWilliam  Woodruff III M.D.   On: 09/25/2017 20:33    Microbiology: No results found for this or any previous visit (from the past 240 hour(s)).   Labs: Basic Metabolic Panel: Recent Labs  Lab 09/25/17 1307 09/26/17 0206 09/26/17 0728 09/27/17 0529  NA 141  --  138 139  K 3.4*  --  3.7 4.1  CL 107  --  105 103  CO2 28  --  24 29  GLUCOSE 94  --  100* 100*  BUN 19  --  14 20  CREATININE 0.73  --  0.69 1.04*  CALCIUM 9.1  --  8.8* 9.3  MG  --  2.0  --   --    Liver Function Tests: No results for input(s): AST, ALT, ALKPHOS, BILITOT, PROT, ALBUMIN in the last 168 hours. No results for input(s): LIPASE, AMYLASE in the last 168 hours. No results for input(s): AMMONIA in the last 168 hours. CBC: Recent  Labs  Lab 09/25/17 1307  WBC 16.9*  HGB 13.2  HCT 39.6  MCV 78.9  PLT 280   Cardiac Enzymes: Recent Labs  Lab 09/26/17 0206 09/26/17 0728 09/26/17 1545  TROPONINI <0.03 <0.03 <0.03   BNP: BNP (last 3 results) Recent Labs    09/25/17 1307 09/27/17 0529  BNP 264.2* 33.1    ProBNP (last 3 results) No results for input(s): PROBNP in the last 8760 hours.  CBG: No results for input(s): GLUCAP in the last 168 hours.     Signed:  Albertine Grates MD, PhD  [facility] 09/27/2017, 5:03 PM

## 2017-09-27 NOTE — Care Management Note (Signed)
Case Management Note  Patient Details  Name: Fransisca Kaufmannina M Schlichting MRN: [id number] Date of Birth: 06-Aug-1976  Subjective/Objective:   Pt admitted with SOB                Action/Plan: Plan to discharge home with no needs. Pt states she is able to purchase medication on $4.00 list. Good Rx coupon given to pt.    Expected Discharge Date:  09/27/17               Expected Discharge Plan:  Home/Self Care  In-House Referral:     Discharge planning Services  CM Consult, Follow-up appt scheduled, [facility]  Post Acute Care Choice:    Choice offered to:  Patient  DME Arranged:    DME Agency:     HH Arranged:    HH Agency:     Status of Service:  Completed, signed off  If discussed at [employer] Meetings, dates discussed:    Additional CommentsGeni Bers:  Cameshia Cressman, RN 09/27/2017, 12:06 PM

## 2017-10-18 ENCOUNTER — Ambulatory Visit (INDEPENDENT_AMBULATORY_CARE_PROVIDER_SITE_OTHER): Payer: Self-pay | Admitting: Family Medicine

## 2017-10-18 ENCOUNTER — Encounter: Payer: Self-pay | Admitting: Family Medicine

## 2017-10-18 VITALS — BP 94/54 | HR 53 | Temp 98.5°F | Resp 16 | Ht 63.0 in | Wt 222.0 lb

## 2017-10-18 DIAGNOSIS — I10 Essential (primary) hypertension: Secondary | ICD-10-CM

## 2017-10-18 DIAGNOSIS — R9431 Abnormal electrocardiogram [ECG] [EKG]: Secondary | ICD-10-CM

## 2017-10-18 DIAGNOSIS — I5189 Other ill-defined heart diseases: Secondary | ICD-10-CM

## 2017-10-18 DIAGNOSIS — Z23 Encounter for immunization: Secondary | ICD-10-CM

## 2017-10-18 DIAGNOSIS — I519 Heart disease, unspecified: Secondary | ICD-10-CM

## 2017-10-18 DIAGNOSIS — R631 Polydipsia: Secondary | ICD-10-CM

## 2017-10-18 DIAGNOSIS — E669 Obesity, unspecified: Secondary | ICD-10-CM

## 2017-10-18 DIAGNOSIS — D72829 Elevated white blood cell count, unspecified: Secondary | ICD-10-CM

## 2017-10-18 LAB — POCT URINALYSIS DIP (DEVICE)
BILIRUBIN URINE: NEGATIVE
Glucose, UA: NEGATIVE mg/dL
KETONES UR: NEGATIVE mg/dL
Nitrite: POSITIVE — AB
PH: 6 (ref 5.0–8.0)
PROTEIN: NEGATIVE mg/dL
SPECIFIC GRAVITY, URINE: 1.01 (ref 1.005–1.030)
Urobilinogen, UA: 0.2 mg/dL (ref 0.0–1.0)

## 2017-10-18 MED ORDER — DILTIAZEM HCL ER COATED BEADS 180 MG PO CP24: 180.0000 mg | ORAL_CAPSULE | Freq: Every day | ORAL | 5 refills | Status: DC

## 2017-10-18 MED FILL — DILTIAZEM 24HR ER 180 MG CA: 180 | 30 days supply | Qty: 30 | Fill #0

## 2017-10-18 NOTE — Patient Instructions (Signed)
Your blood pressure is decreased and heart rate is decreased to a low of 54.  Will decrease diltiazem to 180 mg daily.  We will continue Altace 5 mg and furosemide 20 mg daily. - Continue medication, monitor blood pressure at home. Continue DASH diet. Reminder to go to the ER if any CP, SOB, nausea, dizziness, severe HA, changes vision/speech, left arm numbness and tingling and jaw pain.   Your BMI is 39, recommend a low-fat, low-sodium diet divided over 5-6 small meals throughout the day.  Also increase daily activity level.  Recommend that you complete financial assistance forms for further evaluation.  Will fax EKG and office note to Dr. Jacinto HalimGanji, cardiology.    Hypertension Hypertension, commonly called high blood pressure, is when the force of blood pumping through the arteries is too strong. The arteries are the blood vessels that carry blood from the heart throughout the body. Hypertension forces the heart to work harder to pump blood and may cause arteries to become narrow or stiff. Having untreated or uncontrolled hypertension can cause heart attacks, strokes, kidney disease, and other problems. A blood pressure reading consists of a higher number over a lower number. Ideally, your blood pressure should be below 120/80. The first ("top") number is called the systolic pressure. It is a measure of the pressure in your arteries as your heart beats. The second ("bottom") number is called the diastolic pressure. It is a measure of the pressure in your arteries as the heart relaxes. What are the causes? The cause of this condition is not known. What increases the risk? Some risk factors for high blood pressure are under your control. Others are not. Factors you can change  Smoking.  Having type 2 diabetes mellitus, high cholesterol, or both.  Not getting enough exercise or physical activity.  Being overweight.  Having too much fat, sugar, calories, or salt (sodium) in your  diet.  Drinking too much alcohol. Factors that are difficult or impossible to change  Having chronic kidney disease.  Having a family history of high blood pressure.  Age. Risk increases with age.  Race. You may be at higher risk if you are African-American.  Gender. Men are at higher risk than women before age 41. After age 41, women are at higher risk than men.  Having obstructive sleep apnea.  Stress. What are the signs or symptoms? Extremely high blood pressure (hypertensive crisis) may cause:  Headache.  Anxiety.  Shortness of breath.  Nosebleed.  Nausea and vomiting.  Severe chest pain.  Jerky movements you cannot control (seizures).  How is this diagnosed? This condition is diagnosed by measuring your blood pressure while you are seated, with your arm resting on a surface. The cuff of the blood pressure monitor will be placed directly against the skin of your upper arm at the level of your heart. It should be measured at least twice using the same arm. Certain conditions can cause a difference in blood pressure between your right and left arms. Certain factors can cause blood pressure readings to be lower or higher than normal (elevated) for a short period of time:  When your blood pressure is higher when you are in a health care provider's office than when you are at home, this is called white coat hypertension. Most people with this condition do not need medicines.  When your blood pressure is higher at home than when you are in a health care provider's office, this is called masked hypertension. Most people with  this condition may need medicines to control blood pressure.  If you have a high blood pressure reading during one visit or you have normal blood pressure with other risk factors:  You may be asked to return on a different day to have your blood pressure checked again.  You may be asked to monitor your blood pressure at home for 1 week or longer.  If  you are diagnosed with hypertension, you may have other blood or imaging tests to help your health care provider understand your overall risk for other conditions. How is this treated? This condition is treated by making healthy lifestyle changes, such as eating healthy foods, exercising more, and reducing your alcohol intake. Your health care provider may prescribe medicine if lifestyle changes are not enough to get your blood pressure under control, and if:  Your systolic blood pressure is above 130.  Your diastolic blood pressure is above 80.  Your personal target blood pressure may vary depending on your medical conditions, your age, and other factors. Follow these instructions at home: Eating and drinking  Eat a diet that is high in fiber and potassium, and low in sodium, added sugar, and fat. An example eating plan is called the DASH (Dietary Approaches to Stop Hypertension) diet. To eat this way: ? Eat plenty of fresh fruits and vegetables. Try to fill half of your plate at each meal with fruits and vegetables. ? Eat whole grains, such as whole wheat pasta, brown rice, or whole grain bread. Fill about one quarter of your plate with whole grains. ? Eat or drink low-fat dairy products, such as skim milk or low-fat yogurt. ? Avoid fatty cuts of meat, processed or cured meats, and poultry with skin. Fill about one quarter of your plate with lean proteins, such as fish, chicken without skin, beans, eggs, and tofu. ? Avoid premade and processed foods. These tend to be higher in sodium, added sugar, and fat.  Reduce your daily sodium intake. Most people with hypertension should eat less than 1,500 mg of sodium a day.  Limit alcohol intake to no more than 1 drink a day for nonpregnant women and 2 drinks a day for men. One drink equals 12 oz of beer, 5 oz of wine, or 1 oz of hard liquor. Lifestyle  Work with your health care provider to maintain a healthy body weight or to lose weight. Ask  what an ideal weight is for you.  Get at least 30 minutes of exercise that causes your heart to beat faster (aerobic exercise) most days of the week. Activities may include walking, swimming, or biking.  Include exercise to strengthen your muscles (resistance exercise), such as pilates or lifting weights, as part of your weekly exercise routine. Try to do these types of exercises for 30 minutes at least 3 days a week.  Do not use any products that contain nicotine or tobacco, such as cigarettes and e-cigarettes. If you need help quitting, ask your health care provider.  Monitor your blood pressure at home as told by your health care provider.  Keep all follow-up visits as told by your health care provider. This is important. Medicines  Take over-the-counter and prescription medicines only as told by your health care provider. Follow directions carefully. Blood pressure medicines must be taken as prescribed.  Do not skip doses of blood pressure medicine. Doing this puts you at risk for problems and can make the medicine less effective.  Ask your health care provider about side effects  or reactions to medicines that you should watch for. Contact a health care provider if:  You think you are having a reaction to a medicine you are taking.  You have headaches that keep coming back (recurring).  You feel dizzy.  You have swelling in your ankles.  You have trouble with your vision. Get help right away if:  You develop a severe headache or confusion.  You have unusual weakness or numbness.  You feel faint.  You have severe pain in your chest or abdomen.  You vomit repeatedly.  You have trouble breathing. Summary  Hypertension is when the force of blood pumping through your arteries is too strong. If this condition is not controlled, it may put you at risk for serious complications.  Your personal target blood pressure may vary depending on your medical conditions, your age, and  other factors. For most people, a normal blood pressure is less than 120/80.  Hypertension is treated with lifestyle changes, medicines, or a combination of both. Lifestyle changes include weight loss, eating a healthy, low-sodium diet, exercising more, and limiting alcohol. This information is not intended to replace advice given to you by your health care provider. Make sure you discuss any questions you have with your health care provider. Document Released: 08/08/2005 Document Revised: 07/06/2016 Document Reviewed: 07/06/2016 Elsevier Interactive Patient Education  [employer].

## 2017-10-18 NOTE — Progress Notes (Signed)
Chief Complaint  Patient presents with  . Establish Care  . Hypertension    Subjective:    Patient ID: Lydia Thompson, female    DOB: 11-Apr-1976, 42 y.o.   MRN: [id number]  HPI 42 year old female with a history of hypertension presents to establish care and for post hospital follow-up.  Patient was admitted to inpatient services on 09/25/2017 after presenting to the emergency room with shortness of breath and heart palpitations.  At that time patient had an elevated WBC and mild tachycardia.  BNP was elevated at 264.  Patient CT angiogram was negative for pulmonary embolism but suggestive of early heart failure and pulmonary hypertension.  Cardiology was consulted who started furosemide, diltiazem 240 mg, along with an ACE inhibitor.   Patient has a history of hypertension. She has been taking antihypertension medication consistently. She does not follow a low fat, low sodium diet or exercise routinely.  She denies dizziness, shortness of breath, chest pain, heart palpitations, bilateral lower extremity edema, or syncope.  Past Medical History:  Diagnosis Date  . Hypertension    Social History   Socioeconomic History  . Marital status: Single    Spouse name: Not on file  . Number of children: Not on file  . Years of education: Not on file  . Highest education level: Not on file  Social Needs  . Financial resource strain: Not on file  . Food insecurity - worry: Not on file  . Food insecurity - inability: Not on file  . Transportation needs - medical: Not on file  . Transportation needs - non-medical: Not on file  Occupational History  . Not on file  Tobacco Use  . Smoking status: Never Smoker  . Smokeless tobacco: Never Used  Substance and Sexual Activity  . Alcohol use: Yes    Alcohol/week: 0.0 oz    Comment: social  . Drug use: No  . Sexual activity: Not on file  Other Topics Concern  . Not on file  Social History Narrative  . Not on file   Immunization History    Administered Date(s) Administered  . Tdap 10/18/2017   Allergies as of 10/18/2017   No Known Allergies     Medication List        Accurate as of 10/18/17 11:59 PM. Always use your most recent med list.          BLACK COHOSH PO Take 1 tablet by mouth 2 (two) times daily.   diltiazem 180 MG 24 hr capsule Commonly known as:  CARDIZEM CD Take 1 capsule (180 mg total) by mouth daily.   furosemide 20 MG tablet Commonly known as:  LASIX Take 1 tablet (20 mg total) by mouth daily as needed for fluid or edema.   ramipril 5 MG capsule Commonly known as:  ALTACE Take 1 capsule (5 mg total) by mouth daily.       Review of Systems  Constitutional: Negative.   HENT: Negative.   Respiratory: Negative.   Cardiovascular: Negative.   Gastrointestinal: Negative.   Genitourinary: Negative for difficulty urinating, dyspareunia and flank pain.  Musculoskeletal: Negative.   Allergic/Immunologic: Negative.   Neurological: Negative.   Hematological: Negative.   Psychiatric/Behavioral: Negative.      Objective:   Physical Exam  Constitutional: She is oriented to person, place, and time. She appears well-developed and well-nourished.  HENT:  Head: Normocephalic and atraumatic.  Right Ear: External ear normal.  Left Ear: External ear normal.  Nose: Nose normal.  Mouth/Throat: Oropharynx  is clear and moist.  Eyes: Conjunctivae are normal. Pupils are equal, round, and reactive to light.  Neck: Normal range of motion. Neck supple.  Cardiovascular: Normal rate, regular rhythm, normal heart sounds and intact distal pulses.  Pulmonary/Chest: Effort normal and breath sounds normal.  Abdominal: Soft. Bowel sounds are normal.  Musculoskeletal: Normal range of motion.  Neurological: She is alert and oriented to person, place, and time.  Skin: Skin is warm and dry.      BP (!) 94/54 (BP Location: Left Arm, Patient Position: Sitting, Cuff Size: Large) Comment: manually  Pulse (!) 53   Temp  98.5 F (36.9 C) (Oral)   Resp 16   Ht 5\' 3"  (1.6 m)   Wt 222 lb (100.7 kg)   SpO2 100%   BMI 39.33 kg/m  Assessment & Plan:   1. Essential hypertension Blood pressure decreased will lower diltiazem to 180 mg daily. Will return in 1 week for bp check.  - POCT urinalysis dip (device) - Basic Metabolic Panel  2. Diastolic dysfunction Reviewed echocardiogram:  - Left ventricle: The cavity size was normal. There was severe   concentric hypertrophy. Systolic function was vigorous. The   estimated ejection fraction was in the range of 65% to 70%. There   was no dynamic obstruction. Wall motion was normal; there were no   regional wall motion abnormalities. Features are consistent with   a pseudonormal left ventricular filling pattern, with concomitant   abnormal relaxation and increased filling pressure (grade 2   diastolic dysfunction). - Mitral valve: There was systolic anterior motion. There was   moderate regurgitation directed posteriorly. - Left atrium: The atrium was severely dilated. Volume/bsa, S: 54   ml/m^2.  - diltiazem (CARDIZEM CD) 180 MG 24 hr capsule; Take 1 capsule (180 mg total) by mouth daily.  Dispense: 30 capsule; Refill: 5  3. Abnormal EKG Reviewed previous EKG.  - diltiazem (CARDIZEM CD) 180 MG 24 hr capsule; Take 1 capsule (180 mg total) by mouth daily.  Dispense: 30 capsule; Refill: 5  4. Polydipsia  - HgB A1c  5. Need for Tdap vaccination  - Tdap vaccine greater than or equal to 7yo IM  6. Leukocytosis, unspecified type - CBC  7. Obesity (BMI 35.0-39.9 without comorbidity) Recommend a lowfat, low carbohydrate diet divided over 5-6 small meals, increase water intake to 6-8 glasses, and 150 minutes per week of cardiovascular exercise.   - TSH - Hemoglobin A1C   RTC: 3 months for chronic conditions   Nolon Nations  MSN, FNP-C Patient [facility] [address] [place], [place] [address] [phone]

## 2017-10-19 LAB — HEMOGLOBIN A1C
ESTIMATED AVERAGE GLUCOSE: 108 mg/dL
Hgb A1c MFr Bld: 5.4 % (ref 4.8–5.6)

## 2017-10-19 LAB — CBC
HEMATOCRIT: 37 % (ref 34.0–46.6)
Hemoglobin: 12.4 g/dL (ref 11.1–15.9)
MCH: 26.8 pg (ref 26.6–33.0)
MCHC: 33.5 g/dL (ref 31.5–35.7)
MCV: 80 fL (ref 79–97)
PLATELETS: 281 10*3/uL (ref 150–379)
RBC: 4.62 x10E6/uL (ref 3.77–5.28)
RDW: 14.7 % (ref 12.3–15.4)
WBC: 9.9 10*3/uL (ref 3.4–10.8)

## 2017-10-19 LAB — BASIC METABOLIC PANEL
BUN / CREAT RATIO: 14 (ref 9–23)
BUN: 11 mg/dL (ref 6–24)
CO2: 24 mmol/L (ref 20–29)
CREATININE: 0.76 mg/dL (ref 0.57–1.00)
Calcium: 9.4 mg/dL (ref 8.7–10.2)
Chloride: 102 mmol/L (ref 96–106)
GFR calc Af Amer: 113 mL/min/{1.73_m2} (ref >59)
GFR, EST NON AFRICAN AMERICAN: 98 mL/min/{1.73_m2} (ref >59)
Glucose: 81 mg/dL (ref 65–99)
Potassium: 4.1 mmol/L (ref 3.5–5.2)
Sodium: 138 mmol/L (ref 134–144)

## 2017-10-19 LAB — TSH: TSH: 1.1 u[IU]/mL (ref 0.450–4.500)

## 2017-10-23 ENCOUNTER — Telehealth: Payer: Self-pay

## 2017-10-23 MED FILL — METOPROLOL TARTRATE 25 MG T: 25 | 30 days supply | Qty: 60 | Fill #0

## 2017-10-23 NOTE — Telephone Encounter (Signed)
Called, no answer. No voicemail picked up to leave a message.

## 2017-10-27 ENCOUNTER — Telehealth: Payer: Self-pay

## 2017-10-27 NOTE — Telephone Encounter (Signed)
Called and advised of normal labs, thanks!

## 2017-10-31 MED FILL — RAMIPRIL 5 MG CAPS: 5 | 30 days supply | Qty: 30 | Fill #0

## 2017-10-31 MED FILL — DILTIAZEM 24HR ER 240 MG CA: 240 | 30 days supply | Qty: 30 | Fill #0

## 2017-11-15 ENCOUNTER — Emergency Department (HOSPITAL_COMMUNITY)
Admission: EM | Admit: 2017-11-15 | Discharge: 2017-11-16 | Disposition: A | Discharge status: Home / Self Care | Payer: Self-pay | Attending: Emergency Medicine | Admitting: Emergency Medicine

## 2017-11-15 ENCOUNTER — Encounter (HOSPITAL_COMMUNITY): Payer: Self-pay | Admitting: *Deleted

## 2017-11-15 ENCOUNTER — Ambulatory Visit: Payer: Self-pay

## 2017-11-15 DIAGNOSIS — B309 Viral conjunctivitis, unspecified: Secondary | ICD-10-CM | POA: Insufficient documentation

## 2017-11-15 DIAGNOSIS — G459 Transient cerebral ischemic attack, unspecified: Secondary | ICD-10-CM | POA: Insufficient documentation

## 2017-11-15 DIAGNOSIS — I10 Essential (primary) hypertension: Secondary | ICD-10-CM | POA: Insufficient documentation

## 2017-11-15 DIAGNOSIS — Z79899 Other long term (current) drug therapy: Secondary | ICD-10-CM | POA: Insufficient documentation

## 2017-11-15 MED ORDER — ARTIFICIAL TEARS OPHTHALMIC OINT
TOPICAL_OINTMENT | Freq: Once | OPHTHALMIC | Status: AC
  Administered 2017-11-16: 01:00:00 via OPHTHALMIC
  Filled 2017-11-15: qty 3.5

## 2017-11-15 NOTE — ED Triage Notes (Signed)
Pt complains of redness, itching, drainage from both eyes for the past few days. Pt states her friend noticed her left face drooping this morning, then appeared normal by noon today.

## 2017-11-16 ENCOUNTER — Emergency Department (HOSPITAL_COMMUNITY): Payer: Self-pay

## 2017-11-16 LAB — CBC
HEMATOCRIT: 40.4 % (ref 36.0–46.0)
Hemoglobin: 13 g/dL (ref 12.0–15.0)
MCH: 26.6 pg (ref 26.0–34.0)
MCHC: 32.2 g/dL (ref 30.0–36.0)
MCV: 82.6 fL (ref 78.0–100.0)
PLATELETS: 304 10*3/uL (ref 150–400)
RBC: 4.89 MIL/uL (ref 3.87–5.11)
RDW: 14.7 % (ref 11.5–15.5)
WBC: 12 10*3/uL — AB (ref 4.0–10.5)

## 2017-11-16 LAB — I-STAT CHEM 8, ED
BUN: 12 mg/dL (ref 6–20)
CALCIUM ION: 1.19 mmol/L (ref 1.15–1.40)
CREATININE: 0.7 mg/dL (ref 0.44–1.00)
Chloride: 105 mmol/L (ref 101–111)
GLUCOSE: 87 mg/dL (ref 65–99)
HCT: 39 % (ref 36.0–46.0)
Hemoglobin: 13.3 g/dL (ref 12.0–15.0)
POTASSIUM: 3.4 mmol/L — AB (ref 3.5–5.1)
Sodium: 143 mmol/L (ref 135–145)
TCO2: 26 mmol/L (ref 22–32)

## 2017-11-16 MED ORDER — ASPIRIN 81 MG PO TABS: 81.0000 mg | ORAL_TABLET | Freq: Every day | ORAL | 0 refills | Status: DC

## 2017-11-16 MED ORDER — CIPROFLOXACIN HCL 0.3 % OP OINT: TOPICAL_OINTMENT | OPHTHALMIC | 0 refills | Status: DC

## 2017-11-16 MED ORDER — ASPIRIN 325 MG PO TABS: 325.0000 mg | ORAL_TABLET | Freq: Every day | ORAL | 0 refills | Status: DC

## 2017-11-16 NOTE — ED Provider Notes (Signed)
[facility]-EMERGENCY DEPT Provider Note   CSN: [id number] Arrival date & time: 11/15/17  1727     History   Chief Complaint Chief Complaint  Patient presents with  . Eye Drainage    HPI Lydia Thompson is a 42 y.o. female.  HPI  42 year old female comes in with 2 complaints, eye pain and facial droop.  Patient states that she had an episode of facial droop this morning that lasted for about 3 hours.  Patient had left-sided facial droop without any associated vision change, or numbness/weakness in her extremities or dizziness.  Patient thinks that she might have had some numbness to her face as well.  Past medical history significant for hypertension and diastolic CHF.  Patient denies smoking or any substance abuse.  She denies family history of premature CAD or strokes.  Patient is also complaining of bilateral eye redness.  Symptoms started about 5 days ago and there is change in her tearing from clear to yellow.  Patient denies any eye pain or vision change.  Patient also denies any URI-like symptoms.  The symptoms started in her right eye and have spread to the left side.  Past Medical History:  Diagnosis Date  . Hypertension     Patient Active Problem List   Diagnosis Date Noted  . SOB (shortness of breath) 09/26/2017  . HTN (hypertension) 09/26/2017  . Chest pain 09/26/2017    Past Surgical History:  Procedure Laterality Date  . ABDOMINAL HYSTERECTOMY       OB History   None      Home Medications    Prior to Admission medications   Medication Sig Start Date End Date Taking? Authorizing Provider  BLACK COHOSH PO Take 1 tablet by mouth 2 (two) times daily.    [provider]  diltiazem (CARDIZEM CD) 180 MG 24 hr capsule Take 1 capsule (180 mg total) by mouth daily. 10/18/17   Massie MaroonHollis, Lachina M, FNP  furosemide (LASIX) 20 MG tablet Take 1 tablet (20 mg total) by mouth daily as needed for fluid or edema. 09/27/17   Albertine GratesXu, Fang, MD    ramipril (ALTACE) 5 MG capsule Take 1 capsule (5 mg total) by mouth daily. 09/28/17   Albertine GratesXu, Fang, MD    Family History Family History  Problem Relation Age of Onset  . Hypertension Mother   . Heart disease Paternal Uncle     Social History Social History   Tobacco Use  . Smoking status: Never Smoker  . Smokeless tobacco: Never Used  Substance Use Topics  . Alcohol use: Yes    Alcohol/week: 0.0 oz    Comment: social  . Drug use: No     Allergies   Patient has no known allergies.   Review of Systems Review of Systems  Constitutional: Positive for activity change.  Eyes: Positive for discharge and itching.  Cardiovascular: Negative for chest pain.  Neurological: Positive for facial asymmetry. Negative for dizziness, seizures, speech difficulty, weakness, light-headedness, numbness and headaches.  All other systems reviewed and are negative.    Physical Exam Updated Vital Signs BP 96/62   Pulse (!) 52   Temp 99 F (37.2 C) (Oral)   Resp 17   SpO2 99%   Physical Exam  Constitutional: She is oriented to person, place, and time. She appears well-developed.  HENT:  Head: Normocephalic and atraumatic.  Eyes: Right eye exhibits discharge. Left eye exhibits discharge. Scleral icterus is present.  Neck: Normal range of motion. Neck supple.  Cardiovascular: Normal rate.  Pulmonary/Chest: Effort normal.  Abdominal: Bowel sounds are normal.  Neurological: She is alert and oriented to person, place, and time. No cranial nerve deficit. Coordination normal.  Cerebellar exam is normal (finger to nose) Sensory exam normal for bilateral upper and lower extremities - and patient is able to discriminate between sharp and dull. Motor exam is 4+/5   Skin: Skin is warm and dry.  Nursing note and vitals reviewed.    ED Treatments / Results  Labs (all labs ordered are listed, but only abnormal results are displayed) Labs Reviewed  CBC - Abnormal; Notable for the following  components:      Result Value   WBC 12.0 (*)    All other components within normal limits  I-STAT CHEM 8, ED - Abnormal; Notable for the following components:   Potassium 3.4 (*)    All other components within normal limits    EKG EKG Interpretation  Date/Time:  Thursday November 16 2017 00:20:51 EDT Ventricular Rate:  50 PR Interval:    QRS Duration: 98 QT Interval:  446 QTC Calculation: 407 R Axis:   73 Text Interpretation:  Sinus rhythm Probable left atrial enlargement LVH with secondary repolarization abnormality No acute changes Confirmed by Derwood Kaplan [phone]) on 11/16/2017 12:48:03 AM   Radiology Ct Head Wo Contrast  Result Date: 11/16/2017 CLINICAL DATA:  42 year old female with facial droop. Concern for TIA. EXAM: CT HEAD WITHOUT CONTRAST TECHNIQUE: Contiguous axial images were obtained from the base of the skull through the vertex without intravenous contrast. COMPARISON:  None. FINDINGS: Brain: No evidence of acute infarction, hemorrhage, hydrocephalus, extra-axial collection or mass lesion/mass effect. Vascular: No hyperdense vessel or unexpected calcification. Skull: Normal. Negative for fracture or focal lesion. Sinuses/Orbits: No acute finding. Other: None. IMPRESSION: Normal noncontrast CT of the brain. Electronically Signed   By: Elgie Collard M.D.   On: 11/16/2017 01:49    Procedures Procedures (including critical care time)  Medications Ordered in ED Medications  artificial tears (LACRILUBE) ophthalmic ointment ( Both Eyes Given 11/16/17 0105)     Initial Impression / Assessment and Plan / ED Course  I have reviewed the triage vital signs and the nursing notes.  Pertinent labs & imaging results that were available during my care of the patient were reviewed by me and considered in my medical decision making (see chart for details).     42 year old female comes in with chief complaint of left-sided facial droop that occurred earlier in the day.  She is  also complaining of bilateral tearing of her eyes, which has changed over the past 48 hours.  Patient does not have any vision changes and at the moment she has no focal neurologic deficits.  With a history of hypertension and no other risk factors for stroke, patient's ABCD 2 score is 3.  We will get a CT scan of the head here.  We will advised that patient start taking baby aspirin daily and see her PCP as soon as possible for further workup.  Strict ER return precautions have been discussed already.  For the IV will start on artificial tears.  Patient has been advised not to wear her contact lens.  She will take the topical antibiotic ointment only if her symptoms are getting worse over the next 2 days.  I suspect that she likely had a viral infection, but it might be now moving towards superimposed bacterial infection.  No vision complaints or eye pain.  Final Clinical Impressions(s) /  ED Diagnoses   Final diagnoses:  Acute viral conjunctivitis of both eyes  TIA (transient ischemic attack)    ED Discharge Orders    None       Derwood Kaplan, MD 11/16/17 [phone]

## 2017-11-16 NOTE — Discharge Instructions (Addendum)
We suspect that you might have had a TIA like event.  Please read the instructions provided on TIA. If this indeed was a TIA, then it might be a warning that you are at risk for having stroke in the future.  The risk of stroke is highest within the first 2 days and the first 1 month.  Return to the ER immediately if you start having any numbness, tingling, weakness, vision changes, spinning sensation. See your primary care doctor as soon as possible for further workup. Start taking a baby dose aspirin immediately as prescribed.  Additionally we believe that your teary eyes are because of viral infection.  Continue using artificial tears as directed by the manufacturer. Start using the prescription topical antibiotics only if your symptoms get worse over the next 2 days.  Return to the ER immediately if you start having eye pain or vision changes.  Please do not use the contact lens for now.

## 2017-11-23 MED FILL — DILTIAZEM 24HR ER 240 MG CA: 240 | 30 days supply | Qty: 30 | Fill #0

## 2017-11-24 ENCOUNTER — Encounter: Payer: Self-pay | Admitting: Neurology

## 2017-11-24 MED FILL — RAMIPRIL 5 MG CAPS: 5 | 30 days supply | Qty: 30 | Fill #0

## 2017-11-24 MED FILL — FUROSEMIDE 20 MG TABLET: 20 | 30 days supply | Qty: 30 | Fill #0

## 2017-11-29 ENCOUNTER — Other Ambulatory Visit: Payer: Self-pay

## 2017-11-29 ENCOUNTER — Encounter: Payer: Self-pay | Admitting: Neurology

## 2017-11-29 ENCOUNTER — Ambulatory Visit (INDEPENDENT_AMBULATORY_CARE_PROVIDER_SITE_OTHER): Payer: Self-pay | Admitting: Neurology

## 2017-11-29 VITALS — BP 100/60 | HR 81 | Ht 63.0 in | Wt 218.5 lb

## 2017-11-29 DIAGNOSIS — G459 Transient cerebral ischemic attack, unspecified: Secondary | ICD-10-CM

## 2017-11-29 DIAGNOSIS — I10 Essential (primary) hypertension: Secondary | ICD-10-CM

## 2017-11-29 DIAGNOSIS — R29810 Facial weakness: Secondary | ICD-10-CM

## 2017-11-29 MED ORDER — ATORVASTATIN CALCIUM 10 MG PO TABS: 10.0000 mg | ORAL_TABLET | Freq: Every day | ORAL | 2 refills | Status: DC

## 2017-11-29 NOTE — Progress Notes (Signed)
NEUROLOGY CONSULTATION NOTE  Lydia Thompson MRN: [id number] DOB: 1976/05/01  Referring provider: Yates Decamp, MD Primary care provider: Julianne Handler, FNP  Reason for consult:  TIA  HISTORY OF PRESENT ILLNESS: Lydia Thompson is a 42 year old female with hypertension and diastolic heart failure and pulmonary hypertension who presents for transient ischemic attack.  History supplemented by ED and cardiology notes.  On 11/15/17 she had an episode of left sided facial droop lasting 4 hours.  There was no associated headache, visual disturbance or unilateral numbness or weakness, otalgia, hyperacusis.  She denies inability to close her left eye or frown her forehead.  After symptoms had already resolved, she then presented to the ED for further evaluation.  CT of head was personally reviewed and was unremarkable.  MRI was not performed.  As she had an ABCD 2 score of 3, she was discharged with recommendations to start ASA 81mg  daily.  She was recently admitted to [facility] in February for chest pain with shortness of breath and dyspnea on exertion.  Troponins were negative.  EKG showed no acute changes.  CTA of chest was negative for PE for demonstrated small pleural effusions suspicious for pulmonary edema.  She had an echocardiogram which demonstrated EF 65-70% but with findings consistent with diastolic dysfunction and pulmonary  hypertension.  She was started on diltiazem, Altace and furosemide.  She is followed by cardiologist Dr. Jacinto Halim.  She wore an event monitor from 10/06/17 to 11/04/17 which revealed brief SVT but no a fib or flutter.  Labs from February:  Hgb A1c 5.4, LDL 112, TSH 1.100, HIV negative  She denies history of migraine.  She has never smoked tobacco. She denies family history of early stroke.  PAST MEDICAL HISTORY: Past Medical History:  Diagnosis Date  . Hypertension     PAST SURGICAL HISTORY: Past Surgical History:  Procedure Laterality Date  .  ABDOMINAL HYSTERECTOMY      MEDICATIONS: Current Outpatient Medications on File Prior to Visit  Medication Sig Dispense Refill  . aspirin 81 MG tablet Take 1 tablet (81 mg total) by mouth daily. 30 tablet 0  . diltiazem (CARDIZEM CD) 180 MG 24 hr capsule Take 1 capsule (180 mg total) by mouth daily. 30 capsule 5  . furosemide (LASIX) 20 MG tablet Take 1 tablet (20 mg total) by mouth daily as needed for fluid or edema. 30 tablet 0  . ramipril (ALTACE) 5 MG capsule Take 1 capsule (5 mg total) by mouth daily. 30 capsule 0   No current facility-administered medications on file prior to visit.     ALLERGIES: No Known Allergies  FAMILY HISTORY: Family History  Problem Relation Age of Onset  . Hypertension Mother   . Heart disease Paternal Uncle     SOCIAL HISTORY: Social History   Socioeconomic History  . Marital status: Single    Spouse name: Not on file  . Number of children: Not on file  . Years of education: Not on file  . Highest education level: Not on file  Occupational History  . Not on file  Social Needs  . Financial resource strain: Not on file  . Food insecurity:    Worry: Not on file    Inability: Not on file  . Transportation needs:    Medical: Not on file    Non-medical: Not on file  Tobacco Use  . Smoking status: Never Smoker  . Smokeless tobacco: Never Used  Substance and Sexual Activity  .  Alcohol use: Yes    Alcohol/week: 0.0 oz    Comment: social  . Drug use: No  . Sexual activity: Not on file  Lifestyle  . Physical activity:    Days per week: Not on file    Minutes per session: Not on file  . Stress: Not on file  Relationships  . Social connections:    Talks on phone: Not on file    Gets together: Not on file    Attends religious service: Not on file    Active member of club or organization: Not on file    Attends meetings of clubs or organizations: Not on file    Relationship status: Not on file  . Intimate partner violence:    Fear of  current or ex partner: Not on file    Emotionally abused: Not on file    Physically abused: Not on file    Forced sexual activity: Not on file  Other Topics Concern  . Not on file  Social History Narrative  . Not on file    REVIEW OF SYSTEMS: Constitutional: No fevers, chills, or sweats, no generalized fatigue, change in appetite Eyes: No visual changes, double vision, eye pain Ear, nose and throat: No hearing loss, ear pain, nasal congestion, sore throat Cardiovascular: No chest pain, palpitations Respiratory:  No shortness of breath at rest or with exertion, wheezes GastrointestinaI: No nausea, vomiting, diarrhea, abdominal pain, fecal incontinence Genitourinary:  No dysuria, urinary retention or frequency Musculoskeletal:  No neck pain, back pain Integumentary: No rash, pruritus, skin lesions Neurological: as above Psychiatric: No depression, insomnia, anxiety Endocrine: No palpitations, fatigue, diaphoresis, mood swings, change in appetite, change in weight, increased thirst Hematologic/Lymphatic:  No purpura, petechiae. Allergic/Immunologic: no itchy/runny eyes, nasal congestion, recent allergic reactions, rashes  PHYSICAL EXAM: Vitals:   11/29/17 1503  BP: 100/60  Pulse: 81  SpO2: 97%   General: No acute distress.  Patient appears well-groomed.  Head:  Normocephalic/atraumatic Eyes:  fundi examined but not visualized Neck: supple, no paraspinal tenderness, full range of motion Back: No paraspinal tenderness Heart: regular rate and rhythm Lungs: Clear to auscultation bilaterally. Vascular: No carotid bruits. Neurological Exam: Mental status: alert and oriented to person, place, and time, recent and remote memory intact, fund of knowledge intact, attention and concentration intact, speech fluent and not dysarthric, language intact. Cranial nerves: CN I: not tested CN II: pupils equal, round and reactive to light, visual fields intact CN III, IV, VI:  full range of  motion, no nystagmus, no ptosis CN V: facial sensation intact CN VII: upper and lower face symmetric CN VIII: hearing intact CN IX, X: gag intact, uvula midline CN XI: sternocleidomastoid and trapezius muscles intact CN XII: tongue midline Bulk & Tone: normal, no fasciculations. Motor:  5/5 throughout  Sensation: temperature and vibration sensation intact. Deep Tendon Reflexes:  2+ throughout, toes downgoing.  Finger to nose testing:  Without dysmetria.  Heel to shin:  Without dysmetria.  Gait:  Normal station and stride.  Able to turn and tandem walk. Romberg negative.  IMPRESSION: Transient facial droop.  Consider transient ischemic attack (TIA).  Differential diagnosies also includes Bell's palsy and migraine.  We are not sure if she exhibited both upper and lower facial weakness, but given the brief duration of symptoms, Bell's palsy is less likely.  She did not have associated headache or history of migraines to suggest a migraine.  TIA is not convincing given her age, lack of risk factors other than hypertension,  and no other lateralizing symptoms.  However, it is most likely when compared to other differential diagnoses.   PLAN: 1.  Continue ASA 81mg  daily 2.  Start atorvastatin 10mg  daily (LDL goal should at least be less than 100).  Repeat lipid panel in 3 months, prior to follow up. 3.  Check MRI of brain and MRA of head and neck 4.  Given her age and lack of risk factors, check hypercoagulable panel. 5.  Continue blood pressure control 6.  Follow up in 3 months.  Thank you for allowing me to take part in the care of this patient.  Shon MilletAdam Jaffe, DO  CC:  Julianne HandlerLachina Hollis, FNP  Yates DecampJay Ganji, MD

## 2017-11-29 NOTE — Patient Instructions (Addendum)
At this time, I would treat for a transient ischemic attack (mini-stroke) as I have no other explanation for your symptoms.  1.  Continue aspirin 81mg  daily 2.  Start atorvastatin 10mg  daily.  Repeat lipid panel in 3 months (a week prior to follow up). We will also check a hypercoagulable panel today. Your provider has requested that you have labwork completed today. Please go to [facility] ([id number]) on the second floor of this building before leaving the office today. You do not need to check in. If you are not called within 15 minutes please check with the front desk.  3.  We will perform a stroke workup  MRI of brain without contrast  MRA of head and neck. We [facility] MRI and MRA's they will call you directly to schedule your appt. They are located at [address] If you need to contact them directly please call [phone].  4.  Follow up in 3 months.

## 2017-12-06 LAB — HYPERCOAGULABLE PANEL, COMPREHENSIVE
APTT: 30.3 s — ABNORMAL HIGH
AT III Act/Nor PPP Chro: 107 %
Act. Prt C Resist w/FV Defic.: 2.6 ratio
DRVVT Screen Seconds: 37.6 s
FACTOR VIII ACTIVITY: 47 % — AB
Factor VII Antigen**: 94 %
HEXAGONAL PHOSPHOLIPID NEUTRAL: 6 s
Prot C Ag Act/Nor PPP Imm: 88 %
Prot S Ag Act/Nor PPP Imm: 105 %
Protein C Ag/FVII Ag Ratio**: 0.9 ratio
Protein S Ag/FVII Ag Ratio**: 1.1 ratio

## 2017-12-06 LAB — LIPID PANEL WITH LDL/HDL RATIO
Cholesterol, Total: 160 mg/dL (ref 100–199)
HDL: 38 mg/dL — AB (ref >39)
LDL CALC: 87 mg/dL (ref 0–99)
LDL/HDL RATIO: 2.3 ratio (ref 0.0–3.2)
Triglycerides: 176 mg/dL — ABNORMAL HIGH (ref 0–149)
VLDL CHOLESTEROL CAL: 35 mg/dL (ref 5–40)

## 2017-12-07 ENCOUNTER — Telehealth: Payer: Self-pay

## 2017-12-07 NOTE — Telephone Encounter (Signed)
Called and spoke with Pt, advsd her labs are good.

## 2017-12-07 NOTE — Telephone Encounter (Signed)
-----   Message from Drema DallasAdam R Jaffe, DO sent at 12/07/2017 11:12 AM EDT ----- Labs look okay

## 2017-12-18 ENCOUNTER — Encounter: Payer: Self-pay | Admitting: Neurology

## 2017-12-20 ENCOUNTER — Other Ambulatory Visit: Payer: Self-pay

## 2017-12-20 DIAGNOSIS — G459 Transient cerebral ischemic attack, unspecified: Secondary | ICD-10-CM

## 2018-01-02 MED FILL — RAMIPRIL 5 MG CAPS: 5 | 30 days supply | Qty: 30 | Fill #1

## 2018-01-02 MED FILL — DILTIAZEM 24HR ER 240 MG CA: 240 | 30 days supply | Qty: 30 | Fill #1

## 2018-01-03 MED FILL — FUROSEMIDE 20 MG TABLET: 20 | 30 days supply | Qty: 30 | Fill #0

## 2018-01-12 MED FILL — METOPROLOL TARTRATE 25 MG T: 25 | 30 days supply | Qty: 60 | Fill #0

## 2018-01-17 ENCOUNTER — Encounter: Payer: Self-pay | Admitting: Family Medicine

## 2018-01-17 ENCOUNTER — Other Ambulatory Visit: Payer: Self-pay | Admitting: Family Medicine

## 2018-01-17 ENCOUNTER — Ambulatory Visit (INDEPENDENT_AMBULATORY_CARE_PROVIDER_SITE_OTHER): Payer: Self-pay | Admitting: Family Medicine

## 2018-01-17 VITALS — BP 109/73 | HR 56 | Temp 99.1°F | Resp 16 | Ht 63.0 in | Wt 217.0 lb

## 2018-01-17 DIAGNOSIS — R8290 Unspecified abnormal findings in urine: Secondary | ICD-10-CM

## 2018-01-17 DIAGNOSIS — I5189 Other ill-defined heart diseases: Secondary | ICD-10-CM

## 2018-01-17 DIAGNOSIS — E669 Obesity, unspecified: Secondary | ICD-10-CM

## 2018-01-17 DIAGNOSIS — R9431 Abnormal electrocardiogram [ECG] [EKG]: Secondary | ICD-10-CM

## 2018-01-17 DIAGNOSIS — I10 Essential (primary) hypertension: Secondary | ICD-10-CM

## 2018-01-17 LAB — POCT URINALYSIS DIPSTICK
Bilirubin, UA: NEGATIVE
Blood, UA: NEGATIVE
GLUCOSE UA: NEGATIVE
Ketones, UA: NEGATIVE
NITRITE UA: POSITIVE
PROTEIN UA: NEGATIVE
Spec Grav, UA: 1.015 (ref 1.010–1.025)
Urobilinogen, UA: 1 E.U./dL
pH, UA: 6 (ref 5.0–8.0)

## 2018-01-17 MED ORDER — ATORVASTATIN CALCIUM 10 MG PO TABS: 10.0000 mg | ORAL_TABLET | Freq: Every day | ORAL | 2 refills | Status: DC

## 2018-01-17 MED ORDER — RAMIPRIL 5 MG PO CAPS: 5.0000 mg | ORAL_CAPSULE | Freq: Every day | ORAL | 5 refills | Status: DC

## 2018-01-17 MED ORDER — FUROSEMIDE 20 MG PO TABS: 20.0000 mg | ORAL_TABLET | Freq: Every day | ORAL | 5 refills | Status: DC | PRN

## 2018-01-17 MED ORDER — DILTIAZEM HCL ER COATED BEADS 180 MG PO CP24: 180.0000 mg | ORAL_CAPSULE | Freq: Every day | ORAL | 5 refills | Status: DC

## 2018-01-17 MED FILL — DILTIAZEM 24HR ER 180 MG CA: 180 | 30 days supply | Qty: 30 | Fill #0

## 2018-01-17 MED FILL — ATORVASTATIN 10 MG TABLET: 10 | 30 days supply | Qty: 30 | Fill #0

## 2018-01-17 NOTE — Patient Instructions (Signed)
DASH Eating Plan DASH stands for "Dietary Approaches to Stop Hypertension." The DASH eating plan is a healthy eating plan that has been shown to reduce high blood pressure (hypertension). It may also reduce your risk for type 2 diabetes, heart disease, and stroke. The DASH eating plan may also help with weight loss. What are tips for following this plan? General guidelines  Avoid eating more than 2,300 mg (milligrams) of salt (sodium) a day. If you have hypertension, you may need to reduce your sodium intake to 1,500 mg a day.  Limit alcohol intake to no more than 1 drink a day for nonpregnant women and 2 drinks a day for men. One drink equals 12 oz of beer, 5 oz of wine, or 1 oz of hard liquor.  Work with your health care provider to maintain a healthy body weight or to lose weight. Ask what an ideal weight is for you.  Get at least 30 minutes of exercise that causes your heart to beat faster (aerobic exercise) most days of the week. Activities may include walking, swimming, or biking.  Work with your health care provider or diet and nutrition specialist (dietitian) to adjust your eating plan to your individual calorie needs. Reading food labels  Check food labels for the amount of sodium per serving. Choose foods with less than 5 percent of the Daily Value of sodium. Generally, foods with less than 300 mg of sodium per serving fit into this eating plan.  To find whole grains, look for the word "whole" as the first word in the ingredient list. Shopping  Buy products labeled as "low-sodium" or "no salt added."  Buy fresh foods. Avoid canned foods and premade or frozen meals. Cooking  Avoid adding salt when cooking. Use salt-free seasonings or herbs instead of table salt or sea salt. Check with your health care provider or pharmacist before using salt substitutes.  Do not fry foods. Cook foods using healthy methods such as baking, boiling, grilling, and broiling instead.  Cook with  heart-healthy oils, such as olive, canola, soybean, or sunflower oil. Meal planning   Eat a balanced diet that includes: ? 5 or more servings of fruits and vegetables each day. At each meal, try to fill half of your plate with fruits and vegetables. ? Up to 6-8 servings of whole grains each day. ? Less than 6 oz of lean meat, poultry, or fish each day. A 3-oz serving of meat is about the same size as a deck of cards. One egg equals 1 oz. ? 2 servings of low-fat dairy each day. ? A serving of nuts, seeds, or beans 5 times each week. ? Heart-healthy fats. Healthy fats called Omega-3 fatty acids are found in foods such as flaxseeds and coldwater fish, like sardines, salmon, and mackerel.  Limit how much you eat of the following: ? Canned or prepackaged foods. ? Food that is high in trans fat, such as fried foods. ? Food that is high in saturated fat, such as fatty meat. ? Sweets, desserts, sugary drinks, and other foods with added sugar. ? Full-fat dairy products.  Do not salt foods before eating.  Try to eat at least 2 vegetarian meals each week.  Eat more home-cooked food and less restaurant, buffet, and fast food.  When eating at a restaurant, ask that your food be prepared with less salt or no salt, if possible. What foods are recommended? The items listed may not be a complete list. Talk with your dietitian about what   dietary choices are best for you. Grains Whole-grain or whole-wheat bread. Whole-grain or whole-wheat pasta. Brown rice. Oatmeal. Quinoa. Bulgur. Whole-grain and low-sodium cereals. Pita bread. Low-fat, low-sodium crackers. Whole-wheat flour tortillas. Vegetables Fresh or frozen vegetables (raw, steamed, roasted, or grilled). Low-sodium or reduced-sodium tomato and vegetable juice. Low-sodium or reduced-sodium tomato sauce and tomato paste. Low-sodium or reduced-sodium canned vegetables. Fruits All fresh, dried, or frozen fruit. Canned fruit in natural juice (without  added sugar). Meat and other protein foods Skinless chicken or turkey. Ground chicken or turkey. Pork with fat trimmed off. Fish and seafood. Egg whites. Dried beans, peas, or lentils. Unsalted nuts, nut butters, and seeds. Unsalted canned beans. Lean cuts of beef with fat trimmed off. Low-sodium, lean deli meat. Dairy Low-fat (1%) or fat-free (skim) milk. Fat-free, low-fat, or reduced-fat cheeses. Nonfat, low-sodium ricotta or cottage cheese. Low-fat or nonfat yogurt. Low-fat, low-sodium cheese. Fats and oils Soft margarine without trans fats. Vegetable oil. Low-fat, reduced-fat, or light mayonnaise and salad dressings (reduced-sodium). Canola, safflower, olive, soybean, and sunflower oils. Avocado. Seasoning and other foods Herbs. Spices. Seasoning mixes without salt. Unsalted popcorn and pretzels. Fat-free sweets. What foods are not recommended? The items listed may not be a complete list. Talk with your dietitian about what dietary choices are best for you. Grains Baked goods made with fat, such as croissants, muffins, or some breads. Dry pasta or rice meal packs. Vegetables Creamed or fried vegetables. Vegetables in a cheese sauce. Regular canned vegetables (not low-sodium or reduced-sodium). Regular canned tomato sauce and paste (not low-sodium or reduced-sodium). Regular tomato and vegetable juice (not low-sodium or reduced-sodium). Pickles. Olives. Fruits Canned fruit in a light or heavy syrup. Fried fruit. Fruit in cream or butter sauce. Meat and other protein foods Fatty cuts of meat. Ribs. Fried meat. Bacon. Sausage. Bologna and other processed lunch meats. Salami. Fatback. Hotdogs. Bratwurst. Salted nuts and seeds. Canned beans with added salt. Canned or smoked fish. Whole eggs or egg yolks. Chicken or turkey with skin. Dairy Whole or 2% milk, cream, and half-and-half. Whole or full-fat cream cheese. Whole-fat or sweetened yogurt. Full-fat cheese. Nondairy creamers. Whipped toppings.  Processed cheese and cheese spreads. Fats and oils Butter. Stick margarine. Lard. Shortening. Ghee. Bacon fat. Tropical oils, such as coconut, palm kernel, or palm oil. Seasoning and other foods Salted popcorn and pretzels. Onion salt, garlic salt, seasoned salt, table salt, and sea salt. Worcestershire sauce. Tartar sauce. Barbecue sauce. Teriyaki sauce. Soy sauce, including reduced-sodium. Steak sauce. Canned and packaged gravies. Fish sauce. Oyster sauce. Cocktail sauce. Horseradish that you find on the shelf. Ketchup. Mustard. Meat flavorings and tenderizers. Bouillon cubes. Hot sauce and Tabasco sauce. Premade or packaged marinades. Premade or packaged taco seasonings. Relishes. Regular salad dressings. Where to find more information:  National Heart, Lung, and Blood Institute: www.nhlbi.nih.gov  American Heart Association: www.heart.org Summary  The DASH eating plan is a healthy eating plan that has been shown to reduce high blood pressure (hypertension). It may also reduce your risk for type 2 diabetes, heart disease, and stroke.  With the DASH eating plan, you should limit salt (sodium) intake to 2,300 mg a day. If you have hypertension, you may need to reduce your sodium intake to 1,500 mg a day.  When on the DASH eating plan, aim to eat more fresh fruits and vegetables, whole grains, lean proteins, low-fat dairy, and heart-healthy fats.  Work with your health care provider or diet and nutrition specialist (dietitian) to adjust your eating plan to your individual   calorie needs. This information is not intended to replace advice given to you by your health care provider. Make sure you discuss any questions you have with your health care provider. Document Released: 07/28/2011 Document Revised: 08/01/2016 Document Reviewed: 08/01/2016 Elsevier Interactive Patient Education  2018 Elsevier Inc.  

## 2018-01-17 NOTE — Progress Notes (Signed)
Chief Complaint  Patient presents with  . Hypertension    Subjective:    Patient ID: Lydia Thompson, female    DOB: 11-May-1976, 42 y.o.   MRN: [id number]  HPI Lydia Thompson, a 42 year old female with a history of hypertension, diastolic dysfunction, and hyperlipidemia presents for a follow up of chronic conditions.  Patient was previously admitted to inpatient services on 09/25/2017 after presenting to the emergency room with shortness of breath and heart palpitations.    Patient CT angiogram was negative for pulmonary embolism but suggestive of early heart failure and pulmonary hypertension.  Cardiology was consulted who started furosemide, diltiazem 240 mg, along with an ACE inhibitor.   Patient has a history of hypertension. She has been taking antihypertension medication consistently.  Lydia Thompson states that she has been following a low fat, low sodium diet and has increased daily activity level. Body mass index is 38.44 kg/m.  She denies dizziness, shortness of breath, chest pain, heart palpitations, bilateral lower extremity edema, or syncope.  Past Medical History:  Diagnosis Date  . Hypertension    Social History   Socioeconomic History  . Marital status: Single    Spouse name: Not on file  . Number of children: Not on file  . Years of education: Not on file  . Highest education level: Not on file  Occupational History  . Not on file  Social Needs  . Financial resource strain: Not on file  . Food insecurity:    Worry: Not on file    Inability: Not on file  . Transportation needs:    Medical: Not on file    Non-medical: Not on file  Tobacco Use  . Smoking status: Never Smoker  . Smokeless tobacco: Never Used  Substance and Sexual Activity  . Alcohol use: Yes    Alcohol/week: 0.0 oz    Comment: social  . Drug use: No  . Sexual activity: Not on file  Lifestyle  . Physical activity:    Days per week: Not on file    Minutes per session: Not on file  . Stress: Not on file    Relationships  . Social connections:    Talks on phone: Not on file    Gets together: Not on file    Attends religious service: Not on file    Active member of club or organization: Not on file    Attends meetings of clubs or organizations: Not on file    Relationship status: Not on file  . Intimate partner violence:    Fear of current or ex partner: Not on file    Emotionally abused: Not on file    Physically abused: Not on file    Forced sexual activity: Not on file  Other Topics Concern  . Not on file  Social History Narrative  . Not on file   Immunization History  Administered Date(s) Administered  . Tdap 10/18/2017   Allergies as of 01/17/2018   No Known Allergies     Medication List        Accurate as of 01/17/18  4:08 PM. Always use your most recent med list.          aspirin 81 MG tablet Take 1 tablet (81 mg total) by mouth daily.   atorvastatin 10 MG tablet Commonly known as:  LIPITOR Take 1 tablet (10 mg total) by mouth daily.   diltiazem 180 MG 24 hr capsule Commonly known as:  CARDIZEM CD Take 1 capsule (180 mg total)  by mouth daily.   furosemide 20 MG tablet Commonly known as:  LASIX Take 1 tablet (20 mg total) by mouth daily as needed for fluid or edema.   ramipril 5 MG capsule Commonly known as:  ALTACE Take 1 capsule (5 mg total) by mouth daily.       Review of Systems  Constitutional: Negative.   HENT: Negative.   Respiratory: Negative.   Cardiovascular: Negative.   Gastrointestinal: Negative.   Genitourinary: Negative for difficulty urinating, dyspareunia and flank pain.  Musculoskeletal: Negative.   Allergic/Immunologic: Negative.   Neurological: Negative.   Hematological: Negative.   Psychiatric/Behavioral: Negative.      Objective:   Physical Exam  Constitutional: She is oriented to person, place, and time. She appears well-developed and well-nourished.  HENT:  Head: Normocephalic and atraumatic.  Right Ear: External ear  normal.  Left Ear: External ear normal.  Nose: Nose normal.  Mouth/Throat: Oropharynx is clear and moist.  Eyes: Pupils are equal, round, and reactive to light. Conjunctivae are normal.  Neck: Normal range of motion. Neck supple.  Cardiovascular: Normal rate, regular rhythm, normal heart sounds and intact distal pulses.  Pulmonary/Chest: Effort normal and breath sounds normal.  Abdominal: Soft. Bowel sounds are normal.  Musculoskeletal: Normal range of motion.  Neurological: She is alert and oriented to person, place, and time.  Skin: Skin is warm and dry.      BP 109/73 (BP Location: Left Arm, Patient Position: Sitting, Cuff Size: Large)   Pulse (!) 56   Temp 99.1 F (37.3 C) (Oral)   Resp 16   Ht  (1.6 m)   Wt 217 lb (98.4 kg)   SpO2 99%   BMI 38.44 kg/m  Assessment & Plan:  1. Essential hypertension Blood pressure is at goal on current medication regimen. No medication changes warranted on today.  Will review renal functioning as results become available - Urinalysis Dipstick - Basic Metabolic Panel - Lipid Panel; Future - atorvastatin (LIPITOR) 10 MG tablet; Take 1 tablet (10 mg total) by mouth daily.  Dispense: 30 tablet; Refill: 2 - furosemide (LASIX) 20 MG tablet; Take 1 tablet (20 mg total) by mouth daily as needed for fluid or edema.  Dispense: 30 tablet; Refill: 5 - ramipril (ALTACE) 5 MG capsule; Take 1 capsule (5 mg total) by mouth daily.  Dispense: 30 capsule; Refill: 5  2. Diastolic dysfunction Reviewed echocardiogram:  - Left ventricle: The cavity size was normal. There was severe   concentric hypertrophy. Systolic function was vigorous. The   estimated ejection fraction was in the range of 65% to 70%. There   was no dynamic obstruction. Wall motion was normal; there were no   regional wall motion abnormalities. Features are consistent with   a pseudonormal left ventricular filling pattern, with concomitant   abnormal relaxation and increased filling  pressure (grade 2   diastolic dysfunction). - Mitral valve: There was systolic anterior motion. There was   moderate regurgitation directed posteriorly. - Left atrium: The atrium was severely dilated. Volume/bsa, S: 54   ml/m^2. - diltiazem (CARDIZEM CD) 180 MG 24 hr capsule; Take 1 capsule (180 mg total) by mouth daily.  Dispense: 30 capsule; Refill: 5 - furosemide (LASIX) 20 MG tablet; Take 1 tablet (20 mg total) by mouth daily as needed for fluid or edema.  Dispense: 30 tablet; Refill: 5  3. Abnormal EKG - diltiazem (CARDIZEM CD) 180 MG 24 hr capsule; Take 1 capsule (180 mg total) by mouth daily.  Dispense:  30 capsule; Refill: 5  4. Obesity (BMI 30-39.9) The patient is asked to make an attempt to improve diet and exercise patterns to aid in medical management of this problem.  5. Abnormal urinalysis - Urine Culture  RTC: 3 months for chronic conditions   Nolon Nations  MSN, FNP-C Patient Care [facility] [address] [place], [place] [address] [phone]

## 2018-01-18 LAB — BASIC METABOLIC PANEL
BUN / CREAT RATIO: 19 (ref 9–23)
BUN: 15 mg/dL (ref 6–24)
CHLORIDE: 105 mmol/L (ref 96–106)
CO2: 24 mmol/L (ref 20–29)
Calcium: 9.6 mg/dL (ref 8.7–10.2)
Creatinine, Ser: 0.8 mg/dL (ref 0.57–1.00)
GFR calc non Af Amer: 93 mL/min/{1.73_m2} (ref >59)
GFR, EST AFRICAN AMERICAN: 107 mL/min/{1.73_m2} (ref >59)
Glucose: 99 mg/dL (ref 65–99)
Potassium: 3.7 mmol/L (ref 3.5–5.2)
Sodium: 143 mmol/L (ref 134–144)

## 2018-01-20 LAB — URINE CULTURE

## 2018-01-22 ENCOUNTER — Other Ambulatory Visit: Payer: Self-pay | Admitting: Family Medicine

## 2018-01-22 ENCOUNTER — Telehealth: Payer: Self-pay

## 2018-01-22 DIAGNOSIS — N390 Urinary tract infection, site not specified: Secondary | ICD-10-CM

## 2018-01-22 MED ORDER — SULFAMETHOXAZOLE-TRIMETHOPRIM 800-160 MG PO TABS: 1.0000 | ORAL_TABLET | Freq: Two times a day (BID) | ORAL | 0 refills | Status: DC

## 2018-01-22 MED FILL — SULFAMETHOXAZOLE-TMP DS TAB: 800-160 | 7 days supply | Qty: 14 | Fill #0

## 2018-01-22 NOTE — Progress Notes (Signed)
Meds ordered this encounter  Medications  . sulfamethoxazole-trimethoprim (BACTRIM DS,SEPTRA DS) 800-160 MG tablet    Sig: Take 1 tablet by mouth 2 (two) times daily.    Dispense:  14 tablet    Refill:  0    Nolon NationsLachina Moore Niema Carrara  MSN, FNP-C Patient [facility] [address]  [place], [place] [address] [phone]

## 2018-01-22 NOTE — Telephone Encounter (Signed)
Called and spoke with patient, advised that urine culture showed e coli which is consistent with urinary tract infection. Advised to take bactrim once every 12 hours for 7 days. Asked to increase water intake and wipe from font to back. Patient verbalized understanding. Thanks!

## 2018-01-22 NOTE — Telephone Encounter (Signed)
-----   Message from Massie MaroonLachina M Hollis, [place]NP sent at 01/22/2018  4:57 AM EDT ----- Regarding: lab results Please inform patient that urine culture yielded E.coli, which is consistent with a urinary tract infection. Bactrim 800-160 mg every 12 hours for 7 days, increase water intake, wipe from front to back and practice good perineal hygiene.   Nolon NationsLachina Moore Hollis  MSN, FNP-C Patient Care [facility] [address]  [place], [place] [address] [phone]

## 2018-01-23 ENCOUNTER — Other Ambulatory Visit: Payer: Self-pay | Admitting: Neurology

## 2018-01-31 ENCOUNTER — Other Ambulatory Visit: Payer: Self-pay

## 2018-01-31 MED ORDER — ATORVASTATIN CALCIUM 10 MG PO TABS: 10.0000 mg | ORAL_TABLET | Freq: Every day | ORAL | 2 refills | Status: DC

## 2018-03-05 MED FILL — METOPROLOL TARTRATE 25 MG T: 25 | 30 days supply | Qty: 60 | Fill #1

## 2018-03-05 MED FILL — DILTIAZEM 24HR ER 180 MG CA: 180 | 30 days supply | Qty: 30 | Fill #1

## 2018-03-05 MED FILL — FUROSEMIDE 20 MG TABLET: 20 | 30 days supply | Qty: 30 | Fill #1

## 2018-03-14 ENCOUNTER — Ambulatory Visit (INDEPENDENT_AMBULATORY_CARE_PROVIDER_SITE_OTHER): Payer: Self-pay | Admitting: Neurology

## 2018-03-14 ENCOUNTER — Encounter: Payer: Self-pay | Admitting: Neurology

## 2018-03-14 ENCOUNTER — Ambulatory Visit: Payer: Self-pay | Admitting: Neurology

## 2018-03-14 VITALS — BP 104/78 | HR 55 | Ht 65.0 in | Wt 221.0 lb

## 2018-03-14 DIAGNOSIS — R29810 Facial weakness: Secondary | ICD-10-CM

## 2018-03-14 NOTE — Patient Instructions (Signed)
My suspicion is that the event was a complicated migraine rather than a

## 2018-03-14 NOTE — Progress Notes (Signed)
NEUROLOGY FOLLOW UP OFFICE NOTE  Lydia Thompson [id number]  HISTORY OF PRESENT ILLNESS: Lydia Thompson is a 42 year old female with hypertension and diastolic heart failure and pulmonary hypertension who follows up for facial droop.  UPDATE: She is taking ASA 81mg  daily and atorvastatin 10mg  daily. Labs from 11/29/17:  Hypercoagulable panel unremarkable including homocysteine, Factor VIII activity (low at 47), Protein C/Protein S, anitcardiolipin antibody, beta-2 glycoprotein IgG, Factor II gene mutation, DRVVT.  Factor VIII activity low at 47 and aPTT prolonged at 30.3 but lupus anticoagulant not detected; Lipid panel with total cholesterol 160, TG 176, HDL 38, LDL 87.  MRI of brain and MRA of head and neck were ordered but not performed because she was busy and forgot.  She hasn't had any recurrent or new events.   HISTORY: On 11/15/17 she had an episode of left sided facial droop lasting 4 hours.  There was no associated headache, visual disturbance or unilateral numbness or weakness, otalgia, hyperacusis.  She denies inability to close her left eye or frown her forehead.  After symptoms had already resolved, she then presented to the ED for further evaluation.  CT of head was personally reviewed and was unremarkable.  MRI was not performed.  As she had an ABCD 2 score of 3, she was discharged with recommendations to start ASA 81mg  daily.   [facility] chest pain with shortness of breath and dyspnea on exertion.  Troponins were negative.  EKG showed no acute changes.  CTA of chest was negative for PE for demonstrated small pleural effusions suspicious for pulmonary edema.  She had an echocardiogram which demonstrated EF 65-70% but with findings consistent with diastolic dysfunction and pulmonary  hypertension.  She was started on diltiazem, Altace and furosemide.  She is followed by cardiologist Dr. Jacinto HalimGanji.  She wore an event monitor from  10/06/17 to 11/04/17 which revealed brief SVT but no a fib or flutter.   Labs from February:  Hgb A1c 5.4, LDL 112, TSH 1.100, HIV negative   She denies history of migraine.  She has never smoked tobacco. She denies family history of early stroke. PAST MEDICAL HISTORY: Past Medical History:  Diagnosis Date  . Hypertension     MEDICATIONS: Current Outpatient Medications on File Prior to Visit  Medication Sig Dispense Refill  . aspirin 81 MG tablet Take 1 tablet (81 mg total) by mouth daily. 30 tablet 0  . atorvastatin (LIPITOR) 10 MG tablet Take 1 tablet (10 mg total) by mouth daily. 30 tablet 2  . atorvastatin (LIPITOR) 10 MG tablet Take 1 tablet (10 mg total) by mouth daily. 30 tablet 2  . diltiazem (CARDIZEM CD) 180 MG 24 hr capsule Take 1 capsule (180 mg total) by mouth daily. 30 capsule 5  . furosemide (LASIX) 20 MG tablet Take 1 tablet (20 mg total) by mouth daily as needed for fluid or edema. 30 tablet 5  . metoprolol tartrate (LOPRESSOR) 25 MG tablet Take 25 mg by mouth 2 (two) times daily.  3  . ramipril (ALTACE) 5 MG capsule Take 1 capsule (5 mg total) by mouth daily. (Patient not taking: Reported on 03/14/2018) 30 capsule 5  . sulfamethoxazole-trimethoprim (BACTRIM DS,SEPTRA DS) 800-160 MG tablet Take 1 tablet by mouth 2 (two) times daily. (Patient not taking: Reported on 03/14/2018) 14 tablet 0   No current facility-administered medications on file prior to visit.     ALLERGIES: No Known  Allergies  FAMILY HISTORY: Family History  Problem Relation Age of Onset  . Hypertension Mother   . Heart disease Paternal Uncle     SOCIAL HISTORY: Social History   Socioeconomic History  . Marital status: Single    Spouse name: Not on file  . Number of children: Not on file  . Years of education: Not on file  . Highest education level: Not on file  Occupational History  . Not on file  Social Needs  . Financial resource strain: Not on file  . Food insecurity:    Worry: Not  on file    Inability: Not on file  . Transportation needs:    Medical: Not on file    Non-medical: Not on file  Tobacco Use  . Smoking status: Never Smoker  . Smokeless tobacco: Never Used  Substance and Sexual Activity  . Alcohol use: Yes    Alcohol/week: 0.0 oz    Comment: social  . Drug use: No  . Sexual activity: Not on file  Lifestyle  . Physical activity:    Days per week: Not on file    Minutes per session: Not on file  . Stress: Not on file  Relationships  . Social connections:    Talks on phone: Not on file    Gets together: Not on file    Attends religious service: Not on file    Active member of club or organization: Not on file    Attends meetings of clubs or organizations: Not on file    Relationship status: Not on file  . Intimate partner violence:    Fear of current or ex partner: Not on file    Emotionally abused: Not on file    Physically abused: Not on file    Forced sexual activity: Not on file  Other Topics Concern  . Not on file  Social History Narrative  . Not on file    REVIEW OF SYSTEMS: Constitutional: No fevers, chills, or sweats, no generalized fatigue, change in appetite Eyes: No visual changes, double vision, eye pain Ear, nose and throat: No hearing loss, ear pain, nasal congestion, sore throat Cardiovascular: No chest pain, palpitations Respiratory:  No shortness of breath at rest or with exertion, wheezes GastrointestinaI: No nausea, vomiting, diarrhea, abdominal pain, fecal incontinence Genitourinary:  No dysuria, urinary retention or frequency Musculoskeletal:  No neck pain, back pain Integumentary: No rash, pruritus, skin lesions Neurological: as above Psychiatric: No depression, insomnia, anxiety Endocrine: No palpitations, fatigue, diaphoresis, mood swings, change in appetite, change in weight, increased thirst Hematologic/Lymphatic:  No purpura, petechiae. Allergic/Immunologic: no itchy/runny eyes, nasal congestion, recent  allergic reactions, rashes  PHYSICAL EXAM: Vitals:   03/14/18 1013  BP: 104/78  Pulse: (!) 55  SpO2: 95%   General: No acute distress.  Patient appears well-groomed.   Head:  Normocephalic/atraumatic Eyes:  Fundi examined but not visualized Neck: supple, no paraspinal tenderness, full range of motion Heart:  Regular rate and rhythm Lungs:  Clear to auscultation bilaterally Back: No paraspinal tenderness Neurological Exam: alert and oriented to person, place, and time. Attention span and concentration intact, recent and remote memory intact, fund of knowledge intact.  Speech fluent and not dysarthric, language intact.  CN II-XII intact. Bulk and tone normal, muscle strength 5/5 throughout.  Sensation to light touch  intact.  Deep tendon reflexes 2+ throughout.  Finger to nose testing intact.  Gait normal, Romberg negative.  IMPRESSION: Transient facial droop.  Consider transient ischemic attack (TIA).  Differential diagnosies also includes Bell's palsy and migraine.  We are not sure if she exhibited both upper and lower facial weakness, but given the brief duration of symptoms, Bell's palsy is unlikely likely.  TIA is not convincing given her age, lack of risk factors other than hypertension, and no other lateralizing symptoms.  She did not have associated headache or history of migraines to suggest a migraine, however I believe that a migraine aura without headache is the most likely explanation.    PLAN: 1.  I still want her to have the MRI of brain and MRA of head and neck 2.  She will continue ASA 81mg  daily and atorvastatin 10mg  daily.  If imaging suggests that she had an ischemic event, she will remain on both medications.  If imaging unremarkable, I will have her discontinue atorvastatin but remain on ASA as a precaution  19 minutes spent face to face with patient, over 50% spent discussing management.   Shon Millet, DO  CC:  Julianne Handler, FNP  Yates [place], MD

## 2018-04-06 MED FILL — ATORVASTATIN 10 MG TABLET: 10 | 30 days supply | Qty: 30 | Fill #1

## 2018-04-06 MED FILL — DILTIAZEM 24HR ER 180 MG CA: 180 | 30 days supply | Qty: 30 | Fill #0

## 2018-04-06 MED FILL — FUROSEMIDE 20 MG TABLET: 20 | 30 days supply | Qty: 30 | Fill #2

## 2018-04-19 ENCOUNTER — Ambulatory Visit: Payer: Self-pay | Admitting: Family Medicine

## 2018-05-11 MED FILL — METOPROLOL TARTRATE 25 MG T: 25 | 30 days supply | Qty: 60 | Fill #2

## 2018-05-11 MED FILL — DILTIAZEM 24HR ER 180 MG CA: 180 | 30 days supply | Qty: 30 | Fill #1

## 2018-05-14 MED FILL — ATORVASTATIN 10 MG TABLET: 10 | 30 days supply | Qty: 30 | Fill #2

## 2018-06-08 ENCOUNTER — Ambulatory Visit: Payer: Self-pay | Admitting: Family Medicine

## 2018-06-22 ENCOUNTER — Other Ambulatory Visit: Payer: Self-pay | Admitting: Family Medicine

## 2018-06-22 DIAGNOSIS — I10 Essential (primary) hypertension: Secondary | ICD-10-CM

## 2018-06-22 MED FILL — FUROSEMIDE 20 MG TABLET: 20 | 30 days supply | Qty: 30 | Fill #3

## 2018-06-22 MED FILL — METOPROLOL TARTRATE 25 MG T: 25 | 30 days supply | Qty: 60 | Fill #3

## 2018-06-22 MED FILL — DILTIAZEM 24HR ER 180 MG CA: 180 | 30 days supply | Qty: 30 | Fill #2

## 2018-07-27 MED FILL — METOPROLOL TARTRATE 25 MG T: 25 | 30 days supply | Qty: 60 | Fill #4

## 2018-07-27 MED FILL — DILTIAZEM 24HR ER 180 MG CA: 180 | 30 days supply | Qty: 30 | Fill #3

## 2018-07-27 MED FILL — FUROSEMIDE 20 MG TABLET: 20 | 30 days supply | Qty: 30 | Fill #4

## 2018-08-27 MED FILL — SPIRONOLACTONE 25 MG TABLET: 25 | 30 days supply | Qty: 30 | Fill #0

## 2018-08-29 MED FILL — DILTIAZEM 24HR ER 180 MG CA: 180 | 30 days supply | Qty: 30 | Fill #4

## 2018-09-07 MED FILL — METOPROLOL TARTRATE 25 MG T: 25 | 30 days supply | Qty: 60 | Fill #5

## 2018-09-24 ENCOUNTER — Encounter (HOSPITAL_COMMUNITY): Payer: Self-pay | Admitting: *Deleted

## 2018-09-24 ENCOUNTER — Emergency Department (HOSPITAL_COMMUNITY)
Admission: EM | Admit: 2018-09-24 | Discharge: 2018-09-25 | Disposition: A | Discharge status: Home / Self Care | Payer: Self-pay | Attending: Emergency Medicine | Admitting: Emergency Medicine

## 2018-09-24 ENCOUNTER — Other Ambulatory Visit: Payer: Self-pay

## 2018-09-24 ENCOUNTER — Emergency Department (HOSPITAL_COMMUNITY): Payer: Self-pay

## 2018-09-24 DIAGNOSIS — Z79899 Other long term (current) drug therapy: Secondary | ICD-10-CM | POA: Insufficient documentation

## 2018-09-24 DIAGNOSIS — I5031 Acute diastolic (congestive) heart failure: Secondary | ICD-10-CM | POA: Insufficient documentation

## 2018-09-24 DIAGNOSIS — Z7982 Long term (current) use of aspirin: Secondary | ICD-10-CM | POA: Insufficient documentation

## 2018-09-24 DIAGNOSIS — I10 Essential (primary) hypertension: Secondary | ICD-10-CM | POA: Insufficient documentation

## 2018-09-24 HISTORY — DX: Heart failure, unspecified: I50.9

## 2018-09-24 LAB — CBC
HCT: 41.7 % (ref 36.0–46.0)
Hemoglobin: 12.9 g/dL (ref 12.0–15.0)
MCH: 25.7 pg — ABNORMAL LOW (ref 26.0–34.0)
MCHC: 30.9 g/dL (ref 30.0–36.0)
MCV: 83.2 fL (ref 80.0–100.0)
NRBC: 0 % (ref 0.0–0.2)
PLATELETS: 336 10*3/uL (ref 150–400)
RBC: 5.01 MIL/uL (ref 3.87–5.11)
RDW: 14.4 % (ref 11.5–15.5)
WBC: 13.5 10*3/uL — ABNORMAL HIGH (ref 4.0–10.5)

## 2018-09-24 LAB — BASIC METABOLIC PANEL
Anion gap: 10 (ref 5–15)
BUN: 16 mg/dL (ref 6–20)
CALCIUM: 9.1 mg/dL (ref 8.9–10.3)
CO2: 23 mmol/L (ref 22–32)
CREATININE: 0.88 mg/dL (ref 0.44–1.00)
Chloride: 109 mmol/L (ref 98–111)
Glucose, Bld: 96 mg/dL (ref 70–99)
Potassium: 3.4 mmol/L — ABNORMAL LOW (ref 3.5–5.1)
SODIUM: 142 mmol/L (ref 135–145)

## 2018-09-24 LAB — I-STAT BETA HCG BLOOD, ED (MC, WL, AP ONLY)

## 2018-09-24 LAB — I-STAT TROPONIN, ED: Troponin i, poc: 0.02 ng/mL (ref 0.00–0.08)

## 2018-09-24 LAB — BRAIN NATRIURETIC PEPTIDE: B Natriuretic Peptide: 354.6 pg/mL — ABNORMAL HIGH (ref 0.0–100.0)

## 2018-09-24 MED ORDER — SODIUM CHLORIDE 0.9% FLUSH: 3.0000 mL | Freq: Once | INTRAVENOUS | Status: DC

## 2018-09-24 NOTE — ED Triage Notes (Signed)
Pt has hx of CHF.  Her cardiologist is doing a trial with a DM pill to see if it will help with her CHF.  She has been on the pill since Friday, Sunday she started to have hemoptysis.  She reports same happened about a year ago but she was not on the trial then.  She notified her Cards whom reported that it is unlikely that the DM pill caused the hemoptysis.  She reports cp with mild dizziness and weakness.  She is A&O x 4, in NAD.  She appears comfortable.

## 2018-09-25 MED ORDER — POTASSIUM CHLORIDE CRYS ER 20 MEQ PO TBCR
40.0000 meq | EXTENDED_RELEASE_TABLET | Freq: Once | ORAL | Status: AC
  Administered 2018-09-25: 40 meq via ORAL
  Filled 2018-09-25: qty 2

## 2018-09-25 MED ORDER — FUROSEMIDE 10 MG/ML IJ SOLN
40.0000 mg | Freq: Once | INTRAMUSCULAR | Status: AC
  Administered 2018-09-25: 40 mg via INTRAVENOUS
  Filled 2018-09-25: qty 4

## 2018-09-25 NOTE — ED Provider Notes (Signed)
[facility] Provider Note   CSN: [id number] Arrival date & time: 09/24/18  1603     History   Chief Complaint Chief Complaint  Patient presents with  . Chest Pain    HPI Lydia Thompson is a 43 y.o. female.  The history is provided by the patient.  Shortness of Breath  Severity:  Moderate Onset quality:  Gradual Duration:  1 day Timing:  Intermittent Progression:  Worsening Chronicity:  New Relieved by:  None tried Worsened by:  Nothing Associated symptoms: chest pain, cough, hemoptysis and sputum production   Associated symptoms: no abdominal pain, no fever, no sore throat and no vomiting   Risk factors: no hx of PE/DVT, no prolonged immobilization and no recent surgery   Patient with known history of hypertension and CHF presents with cough, shortness of breath and chest pain. She reports about a day ago she began having increasing cough with blood-tinged sputum.  She reports multiple episodes of this.  She also reports feeling increasing shortness of breath.  She reports recent increasing lower extremity edema.  No pleuritic pain.  She does report that her chest does feel tight with breathing. SHe is a non-smoker. No long distance travel Started a diabetic medication last week, and she thought this may have caused her symptoms. She reports a similar episode about a year ago that required hospitalization She has called her cardiologist about this condition Past Medical History:  Diagnosis Date  . CHF (congestive heart failure) (HCC)   . Hypertension     Patient Active Problem List   Diagnosis Date Noted  . SOB (shortness of breath) 09/26/2017  . HTN (hypertension) 09/26/2017  . Chest pain 09/26/2017    Past Surgical History:  Procedure Laterality Date  . ABDOMINAL HYSTERECTOMY       OB History   No obstetric history on file.      Home Medications    Prior to Admission medications   Medication Sig Start Date End  Date Taking? Authorizing Provider  aspirin 81 MG tablet Take 1 tablet (81 mg total) by mouth daily. 11/16/17  Yes Derwood KaplanNanavati, Ankit, MD  atorvastatin (LIPITOR) 10 MG tablet Take 1 tablet (10 mg total) by mouth daily. 01/31/18  Yes Jaffe, Adam R, DO  diltiazem (CARDIZEM CD) 180 MG 24 hr capsule Take 1 capsule (180 mg total) by mouth daily. 01/17/18  Yes Massie MaroonHollis, Lachina M, FNP  metoprolol tartrate (LOPRESSOR) 25 MG tablet Take 25 mg by mouth 2 (two) times daily. 03/05/18  Yes [provider]  spironolactone (ALDACTONE) 25 MG tablet Take 25 mg by mouth daily.   Yes [provider]  atorvastatin (LIPITOR) 10 MG tablet Take 1 tablet (10 mg total) by mouth daily. Patient not taking: Reported on 09/25/2018 01/17/18   Massie MaroonHollis, Lachina M, FNP  furosemide (LASIX) 20 MG tablet Take 1 tablet (20 mg total) by mouth daily as needed for fluid or edema. Patient not taking: Reported on 09/25/2018 01/17/18   Massie MaroonHollis, Lachina M, FNP  ramipril (ALTACE) 5 MG capsule Take 1 capsule (5 mg total) by mouth daily. Patient not taking: Reported on 03/14/2018 01/17/18   Massie MaroonHollis, Lachina M, FNP  sulfamethoxazole-trimethoprim (BACTRIM DS,SEPTRA DS) 800-160 MG tablet Take 1 tablet by mouth 2 (two) times daily. Patient not taking: Reported on 03/14/2018 01/22/18   Massie MaroonHollis, Lachina M, FNP    Family History Family History  Problem Relation Age of Onset  . Hypertension Mother   . Heart disease Paternal Uncle  Social History Social History   Tobacco Use  . Smoking status: Never Smoker  . Smokeless tobacco: Never Used  Substance Use Topics  . Alcohol use: Yes    Alcohol/week: 0.0 standard drinks    Comment: social  . Drug use: No     Allergies   Patient has no known allergies.   Review of Systems Review of Systems  Constitutional: Negative for fever.  HENT: Negative for sore throat.   Respiratory: Positive for cough, hemoptysis, sputum production and shortness of breath.   Cardiovascular: Positive for chest  pain and leg swelling.  Gastrointestinal: Negative for abdominal pain and vomiting.  Musculoskeletal: Negative for myalgias.  All other systems reviewed and are negative.    Physical Exam Updated Vital Signs BP 113/73   Pulse 72   Temp 98.5 F (36.9 C) (Oral)   Resp (!) 26   SpO2 98%   Physical Exam CONSTITUTIONAL: Well developed/well nourished HEAD: Normocephalic/atraumatic EYES: EOMI/PERRL ENMT: Mucous membranes moist NECK: supple no meningeal signs SPINE/BACK:entire spine nontender CV: S1/S2 noted, murmur noted LUNGS: Decreased breath sounds in the base ABDOMEN: soft, nontender, no rebound or guarding, bowel sounds noted throughout abdomen GU:no cva tenderness NEURO: Pt is awake/alert/appropriate, moves all extremitiesx4.  No facial droop.   EXTREMITIES: pulses normal/equal, full ROM, symmetric edema noted lower extremities SKIN: warm, color normal PSYCH: no abnormalities of mood noted, alert and oriented to situation   ED Treatments / Results  Labs (all labs ordered are listed, but only abnormal results are displayed) Labs Reviewed  BASIC METABOLIC PANEL - Abnormal; Notable for the following components:      Result Value   Potassium 3.4 (*)    All other components within normal limits  CBC - Abnormal; Notable for the following components:   WBC 13.5 (*)    MCH 25.7 (*)    All other components within normal limits  BRAIN NATRIURETIC PEPTIDE - Abnormal; Notable for the following components:   B Natriuretic Peptide 354.6 (*)    All other components within normal limits  I-STAT TROPONIN, ED  I-STAT BETA HCG BLOOD, ED ([facility], [facility], [facility] ONLY)    EKG EKG Interpretation  Date/Time:  Tuesday September 25 2018 00:45:11 EST Ventricular Rate:  71 PR Interval:    QRS Duration: 98 QT Interval:  399 QTC Calculation: 434 R Axis:   74 Text Interpretation:  Sinus rhythm Left atrial enlargement LVH with secondary repolarization abnormality No significant change since last  tracing Confirmed by Zadie Rhine ([id number]) on 09/25/2018 12:57:52 AM   Radiology Dg Chest 2 View  Result Date: 09/24/2018 CLINICAL DATA:  Bloody sputum.  Chest pain and shortness of breath. EXAM: CHEST - 2 VIEW COMPARISON:  September 25, 2017. FINDINGS: No pneumothorax. Mild cardiomegaly. The hila and mediastinum are unremarkable. Increased interstitial markings bilaterally, mildly more focal in the right base. No other acute interval changes. IMPRESSION: Increased interstitial markings in the lungs, right greater than left, mildly more focal in the right base. The findings could represent asymmetric edema versus atypical infection/pneumonia. Recommend follow-up to resolution. Electronically Signed   By: Gerome Sam III M.D   On: 09/24/2018 17:24    Procedures Procedures (including critical care time)  Medications Ordered in ED Medications  sodium chloride flush (NS) 0.9 % injection 3 mL (has no administration in time range)  furosemide (LASIX) injection 40 mg (40 mg Intravenous Given 09/25/18 0050)  potassium chloride SA (K-DUR,KLOR-CON) CR tablet 40 mEq (40 mEq Oral Given 09/25/18 0041)  Initial Impression / Assessment and Plan / ED Course  I have reviewed the triage vital signs and the nursing notes.  Pertinent labs & imaging results that were available during my care of the patient were reviewed by me and considered in my medical decision making (see chart for details).     1:14 AM Patient presenting with shortness of breath.  She has known history of diastolic dysfunction.  She reports some chest tightness with breathing.  No pleuritic or sharp pain.  She reports some blood-tinged sputum yesterday that has improved.  She reports her symptoms are very similar to prior episodes of CHF.  She does report she may have ingested too much fluids recently.  She does not take Lasix, but takes other diuretics.  She has been otherwise been compliant. Denies any flu or febrile symptoms. I  suspect labs and imaging indicate more of a cardiopulmonary cause other than an infectious cause My suspicion for acute PE is low. She has no increased work of breathing and is in no distress. Plan to diuresis and reassess. 3:58 AM Patient reports feeling much improved. She reports she is feeling back to baseline.  She denies dyspnea on exertion.  She denies exertional chest pain. She has had no further hemoptysis.  She is in no acute distress.  There is no pleuritic pain. On ambulation she felt very well, and at baseline She did have drop in O2 sat towards end of ambulation but she was asymptomatic and it quickly resolved Pt feels comfortable for d/c home Suspect this all represents mild CHF exacerbation Currently without symptoms for ACS/PE/Dissection Encouraged to continue meds She will call her cardiologist later today  Final Clinical Impressions(s) / ED Diagnoses   Final diagnoses:  Acute diastolic congestive heart failure [facility](HCC)    ED Discharge Orders    None       Zadie RhineWickline, Adilynne Fitzwater, MD 09/25/18 0400

## 2018-09-25 NOTE — ED Notes (Signed)
Pt discharged from ED; instructions provided; Pt encouraged to return to ED if symptoms worsen and to f/u with PCP; Pt verbalized understanding of all instructions 

## 2018-09-25 NOTE — ED Notes (Addendum)
Pt ambulated without assistance. Pt initial sats between 94-97% on RA; however approx 3/4 into walk pt begin to desat as low as 86% on RA; Pt denied SOB or discomfort; she stated that she "felt fine;"  but RN encouraged brief period to rest; rested for approx 1 min and completed walk; O2 sats increased to 94% on RA.

## 2018-09-26 ENCOUNTER — Ambulatory Visit: Payer: Self-pay | Admitting: Family Medicine

## 2018-09-26 ENCOUNTER — Ambulatory Visit (HOSPITAL_COMMUNITY)
Admission: RE | Admit: 2018-09-26 | Discharge: 2018-09-26 | Disposition: A | Discharge status: Home / Self Care | Payer: Self-pay | Source: Ambulatory Visit | Attending: Family Medicine | Admitting: Family Medicine

## 2018-09-26 ENCOUNTER — Encounter: Payer: Self-pay | Admitting: Family Medicine

## 2018-09-26 ENCOUNTER — Ambulatory Visit (INDEPENDENT_AMBULATORY_CARE_PROVIDER_SITE_OTHER): Payer: Self-pay | Admitting: Family Medicine

## 2018-09-26 VITALS — BP 102/78 | HR 53 | Temp 98.5°F | Resp 16 | Ht 65.0 in | Wt 233.0 lb

## 2018-09-26 DIAGNOSIS — Z131 Encounter for screening for diabetes mellitus: Secondary | ICD-10-CM

## 2018-09-26 DIAGNOSIS — D72829 Elevated white blood cell count, unspecified: Secondary | ICD-10-CM

## 2018-09-26 DIAGNOSIS — R9389 Abnormal findings on diagnostic imaging of other specified body structures: Secondary | ICD-10-CM

## 2018-09-26 DIAGNOSIS — I10 Essential (primary) hypertension: Secondary | ICD-10-CM

## 2018-09-26 DIAGNOSIS — I5189 Other ill-defined heart diseases: Secondary | ICD-10-CM

## 2018-09-26 DIAGNOSIS — Z09 Encounter for follow-up examination after completed treatment for conditions other than malignant neoplasm: Secondary | ICD-10-CM

## 2018-09-26 LAB — POCT URINALYSIS DIPSTICK
Bilirubin, UA: NEGATIVE
Blood, UA: NEGATIVE
Glucose, UA: NEGATIVE
Ketones, UA: NEGATIVE
Leukocytes, UA: NEGATIVE
Nitrite, UA: NEGATIVE
Protein, UA: NEGATIVE
Spec Grav, UA: 1.01 (ref 1.010–1.025)
Urobilinogen, UA: 0.2 E.U./dL
pH, UA: 5.5 (ref 5.0–8.0)

## 2018-09-26 LAB — POCT GLYCOSYLATED HEMOGLOBIN (HGB A1C): Hemoglobin A1C: 5 % (ref 4.0–5.6)

## 2018-09-26 MED ORDER — METOPROLOL TARTRATE 25 MG PO TABS: 25.0000 mg | ORAL_TABLET | Freq: Two times a day (BID) | ORAL | 5 refills | Status: DC

## 2018-09-26 MED ORDER — SPIRONOLACTONE 25 MG PO TABS: 25.0000 mg | ORAL_TABLET | Freq: Every day | ORAL | 5 refills | Status: DC

## 2018-09-26 MED ORDER — DILTIAZEM HCL ER COATED BEADS 180 MG PO CP24: 180.0000 mg | ORAL_CAPSULE | Freq: Every day | ORAL | 5 refills | Status: DC

## 2018-09-26 MED FILL — SPIRONOLACTONE 25 MG TABLET: 25 | 30 days supply | Qty: 30 | Fill #0

## 2018-09-26 MED FILL — DILTIAZEM 24HR ER 180 MG CA: 180 | 30 days supply | Qty: 30 | Fill #0

## 2018-09-26 NOTE — Patient Instructions (Signed)

## 2018-09-26 NOTE — Progress Notes (Signed)
[facility]   Progress Note: General Provider: Mike Gip, FNP  SUBJECTIVE:   Lydia Thompson is a 43 y.o. female who  has a past medical history of CHF (congestive heart failure) (HCC) and Hypertension.. Patient presents today for Hypertension and Hospitalization Follow-up  Patient seen in the ED on 09/24/2018 due to chest pain, hemoptysis and SOB.She reports recent increasing lower extremity edema.  No pleuritic pain.  She does report that her chest does feel tight with breathing. SHe is a non-smoker.No long distance travel Patient reports starting a blind study with [place] this week, and she thought this may have caused her symptoms.She reports a similar episode about a year ago that required hospitalization. She is followed by Cardiology for CHF.  Patient noted to have leukocytosis with WBC at 13.5 Chest x ray 09/24/2018  IMPRESSION: Increased interstitial markings in the lungs, right greater than left, mildly more focal in the right base. The findings could represent asymmetric edema versus atypical infection/pneumonia. Recommend follow-up to resolution.  Review of Systems  Constitutional: Negative.   HENT: Negative.   Eyes: Negative.   Respiratory: Negative.   Cardiovascular: Negative.   Gastrointestinal: Negative.   Genitourinary: Negative.   Musculoskeletal: Negative.   Skin: Negative.   Neurological: Negative.   Psychiatric/Behavioral: Negative.      OBJECTIVE: BP 102/78 (BP Location: Left Arm, Patient Position: Sitting, Cuff Size: Large)   Pulse (!) 53   Temp 98.5 F (36.9 C) (Oral)   Resp 16   Ht 5\' 5"  (1.651 m)   Wt 233 lb (105.7 kg)   SpO2 99%   BMI 38.77 kg/m   Wt Readings from Last 3 Encounters:  09/26/18 233 lb (105.7 kg)  03/14/18 221 lb (100.2 kg)  01/17/18 217 lb (98.4 kg)     Physical Exam Vitals signs and nursing note reviewed.  Constitutional:      General: She is not in acute distress.   Appearance: She is well-developed.  HENT:     Head: Normocephalic and atraumatic.  Eyes:     Conjunctiva/sclera: Conjunctivae normal.     Pupils: Pupils are equal, round, and reactive to light.  Neck:     Musculoskeletal: Normal range of motion.  Cardiovascular:     Rate and Rhythm: Normal rate and regular rhythm.     Heart sounds: Murmur present.  Pulmonary:     Effort: Pulmonary effort is normal. No respiratory distress.     Breath sounds: Normal breath sounds.  Abdominal:     General: Bowel sounds are normal. There is no distension.     Palpations: Abdomen is soft.  Musculoskeletal: Normal range of motion.  Skin:    General: Skin is warm and dry.  Neurological:     Mental Status: She is alert and oriented to person, place, and time.  Psychiatric:        Mood and Affect: Mood normal.        Behavior: Behavior normal.        Thought Content: Thought content normal.        Judgment: Judgment normal.     ASSESSMENT/PLAN:  1. Essential hypertension No medication changes warranted at the present time.   - Urinalysis Dipstick - CBC with Differential - Comprehensive metabolic panel  2. Hospital discharge follow-up Patient to follow up with cardiology this week.   3. Leukocytosis, unspecified type Will repeat CBC today.  - CBC with Differential  4. Screening for diabetes mellitus -  HgB A1c- 5.0  5. Abnormal chest x-ray - DG Chest 2 View; Future  6. Diastolic dysfunction - diltiazem (CARDIZEM CD) 180 MG 24 hr capsule; Take 1 capsule (180 mg total) by mouth daily.  Dispense: 30 capsule; Refill: 5 - metoprolol tartrate (LOPRESSOR) 25 MG tablet; Take 1 tablet (25 mg total) by mouth 2 (two) times daily.  Dispense: 60 tablet; Refill: 5 - spironolactone (ALDACTONE) 25 MG tablet; Take 1 tablet (25 mg total) by mouth daily.  Dispense: 30 tablet; Refill: 5      Return in about 3 months (around 12/25/2018) for HTn.    The patient was given clear instructions to go to ER  or return to medical center if symptoms do not improve, worsen or new problems develop. The patient verbalized understanding and agreed with plan of care.   [name]. [name], FNP-BC [facility] [address] [place], [place] [address] [phone]

## 2018-09-27 ENCOUNTER — Telehealth: Payer: Self-pay

## 2018-09-27 LAB — COMPREHENSIVE METABOLIC PANEL
ALT: 14 IU/L (ref 0–32)
AST: 12 IU/L (ref 0–40)
Albumin/Globulin Ratio: 1.3 (ref 1.2–2.2)
Albumin: 3.9 g/dL (ref 3.8–4.8)
Alkaline Phosphatase: 63 IU/L (ref 39–117)
BUN/Creatinine Ratio: 16 (ref 9–23)
BUN: 14 mg/dL (ref 6–24)
Bilirubin Total: 0.4 mg/dL (ref 0.0–1.2)
CO2: 20 mmol/L (ref 20–29)
Calcium: 9.3 mg/dL (ref 8.7–10.2)
Chloride: 104 mmol/L (ref 96–106)
Creatinine, Ser: 0.85 mg/dL (ref 0.57–1.00)
GFR calc Af Amer: 98 mL/min/{1.73_m2} (ref >59)
GFR calc non Af Amer: 85 mL/min/{1.73_m2} (ref >59)
Globulin, Total: 3 g/dL (ref 1.5–4.5)
Glucose: 95 mg/dL (ref 65–99)
Potassium: 4.1 mmol/L (ref 3.5–5.2)
Sodium: 140 mmol/L (ref 134–144)
Total Protein: 6.9 g/dL (ref 6.0–8.5)

## 2018-09-27 LAB — CBC WITH DIFFERENTIAL/PLATELET
Basophils Absolute: 0 10*3/uL (ref 0.0–0.2)
Basos: 0 %
EOS (ABSOLUTE): 0.3 10*3/uL (ref 0.0–0.4)
Eos: 2 %
Hematocrit: 39.8 % (ref 34.0–46.6)
Hemoglobin: 13.5 g/dL (ref 11.1–15.9)
Immature Grans (Abs): 0 10*3/uL (ref 0.0–0.1)
Immature Granulocytes: 0 %
Lymphocytes Absolute: 3.1 10*3/uL (ref 0.7–3.1)
Lymphs: 28 %
MCH: 27.2 pg (ref 26.6–33.0)
MCHC: 33.9 g/dL (ref 31.5–35.7)
MCV: 80 fL (ref 79–97)
Monocytes Absolute: 0.9 10*3/uL (ref 0.1–0.9)
Monocytes: 8 %
Neutrophils Absolute: 6.8 10*3/uL (ref 1.4–7.0)
Neutrophils: 62 %
Platelets: 352 10*3/uL (ref 150–450)
RBC: 4.97 x10E6/uL (ref 3.77–5.28)
RDW: 14.3 % (ref 11.7–15.4)
WBC: 11.1 10*3/uL — ABNORMAL HIGH (ref 3.4–10.8)

## 2018-09-27 NOTE — Telephone Encounter (Signed)
No signs of pneumonia or infection on the chest xray. The swelling and fluid have resolved. WBC count has decreased.

## 2018-09-27 NOTE — Telephone Encounter (Signed)
Called, she was asking of results from xray/labs. Please advise.

## 2018-09-28 ENCOUNTER — Other Ambulatory Visit (HOSPITAL_COMMUNITY): Payer: Self-pay | Admitting: Cardiology

## 2018-09-28 ENCOUNTER — Telehealth: Payer: Self-pay

## 2018-09-28 DIAGNOSIS — R9431 Abnormal electrocardiogram [ECG] [EKG]: Secondary | ICD-10-CM

## 2018-09-28 DIAGNOSIS — I429 Cardiomyopathy, unspecified: Secondary | ICD-10-CM

## 2018-09-28 NOTE — Telephone Encounter (Signed)
Called and spoke with patient, advised that chest xray showed no signs of pneumonia or infection. Advised that the swelling and fluid have resolved and that WBC count has decreased. Thanks!

## 2018-09-28 NOTE — Telephone Encounter (Signed)
Called, no answer. Left a message for patient to call back. Thanks!  

## 2018-09-28 NOTE — Telephone Encounter (Signed)
See previous message

## 2018-10-16 ENCOUNTER — Telehealth (HOSPITAL_COMMUNITY): Payer: Self-pay | Admitting: Emergency Medicine

## 2018-10-16 MED FILL — METOPROLOL TARTRATE 25 MG T: 25 | 30 days supply | Qty: 60 | Fill #0

## 2018-10-16 NOTE — Telephone Encounter (Signed)
Left message on voicemail with name and callback number Mills Mitton RN Navigator Cardiac Imaging [facility] [phone] Office [phone] Cell  

## 2018-10-17 ENCOUNTER — Ambulatory Visit (HOSPITAL_COMMUNITY)
Admission: RE | Admit: 2018-10-17 | Discharge: 2018-10-17 | Disposition: A | Discharge status: Home / Self Care | Payer: Self-pay | Source: Ambulatory Visit | Attending: Cardiology | Admitting: Cardiology

## 2018-10-17 DIAGNOSIS — R9431 Abnormal electrocardiogram [ECG] [EKG]: Secondary | ICD-10-CM | POA: Insufficient documentation

## 2018-10-17 DIAGNOSIS — I429 Cardiomyopathy, unspecified: Secondary | ICD-10-CM | POA: Insufficient documentation

## 2018-10-17 MED ORDER — NITROGLYCERIN 0.4 MG SL SUBL
SUBLINGUAL_TABLET | SUBLINGUAL | Status: AC
  Filled 2018-10-17: qty 2

## 2018-10-17 MED ORDER — METOPROLOL TARTRATE 5 MG/5ML IV SOLN
5.0000 mg | INTRAVENOUS | Status: DC | PRN
  Filled 2018-10-17: qty 5

## 2018-10-17 MED ORDER — IOPAMIDOL (ISOVUE-370) INJECTION 76%
80.0000 mL | Freq: Once | INTRAVENOUS | Status: AC | PRN
  Administered 2018-10-17: 80 mL via INTRAVENOUS

## 2018-10-17 MED ORDER — NITROGLYCERIN 0.4 MG SL SUBL
0.8000 mg | SUBLINGUAL_TABLET | SUBLINGUAL | Status: DC | PRN
  Administered 2018-10-17: 0.8 mg via SUBLINGUAL
  Filled 2018-10-17 (×2): qty 25

## 2018-10-22 ENCOUNTER — Encounter: Payer: Self-pay | Admitting: Cardiology

## 2018-10-22 ENCOUNTER — Ambulatory Visit (INDEPENDENT_AMBULATORY_CARE_PROVIDER_SITE_OTHER): Payer: Self-pay | Admitting: Cardiology

## 2018-10-22 ENCOUNTER — Encounter: Payer: Self-pay | Admitting: Family Medicine

## 2018-10-22 ENCOUNTER — Ambulatory Visit (INDEPENDENT_AMBULATORY_CARE_PROVIDER_SITE_OTHER): Payer: Self-pay | Admitting: Family Medicine

## 2018-10-22 VITALS — BP 116/80 | HR 65 | Ht 65.0 in | Wt 240.9 lb

## 2018-10-22 VITALS — BP 118/72 | HR 64 | Temp 97.9°F | Ht 65.0 in | Wt 240.0 lb

## 2018-10-22 DIAGNOSIS — I422 Other hypertrophic cardiomyopathy: Secondary | ICD-10-CM

## 2018-10-22 DIAGNOSIS — I5032 Chronic diastolic (congestive) heart failure: Secondary | ICD-10-CM

## 2018-10-22 DIAGNOSIS — I5189 Other ill-defined heart diseases: Secondary | ICD-10-CM

## 2018-10-22 DIAGNOSIS — R059 Cough, unspecified: Secondary | ICD-10-CM

## 2018-10-22 DIAGNOSIS — R05 Cough: Secondary | ICD-10-CM

## 2018-10-22 DIAGNOSIS — R0789 Other chest pain: Secondary | ICD-10-CM

## 2018-10-22 MED ORDER — SPIRONOLACTONE 25 MG PO TABS: 25.0000 mg | ORAL_TABLET | Freq: Every day | ORAL | 5 refills | Status: DC

## 2018-10-22 MED FILL — SPIRONOLACTONE 25 MG TABLET: 25 | 30 days supply | Qty: 30 | Fill #0

## 2018-10-22 NOTE — Progress Notes (Signed)
Patient is here for follow up visit.  Subjective:   Lydia Thompson, female    DOB: 09-05-1975, 43 y.o.   MRN: [id number]   Chief Complaint  Patient presents with  . Hypertension  . Follow-up     HPI  43 year old African American female with hypertrophic cardiomyopathy, history of SVT, TIA, abnormal stress test, here for follow up.  Given her abnormal exercise treadmill stress test, I recommended coronary CT angiogram for anatomic evaluation.  This showed no coronary artery disease with normal coronaries.  Pulmonary artery was dilated at 3.6 cm suggestive of pulmonary hypertension.  Patient is here for follow up today. She continues to have episodes of chest pain. She also had an episode of "coughing up blood". She saw her PCP who has recommended chest Xray.   She has started walking every day, at least 5,000 steps with no significant difficulty.  Past Medical History:  Diagnosis Date  . CHF (congestive heart failure) (HCC)   . Hypertension      Past Surgical History:  Procedure Laterality Date  . ABDOMINAL HYSTERECTOMY       Social History   Socioeconomic History  . Marital status: Single    Spouse name: Not on file  . Number of children: Not on file  . Years of education: Not on file  . Highest education level: Not on file  Occupational History  . Not on file  Social Needs  . Financial resource strain: Not on file  . Food insecurity:    Worry: Not on file    Inability: Not on file  . Transportation needs:    Medical: Not on file    Non-medical: Not on file  Tobacco Use  . Smoking status: Never Smoker  . Smokeless tobacco: Never Used  Substance and Sexual Activity  . Alcohol use: Yes    Alcohol/week: 0.0 standard drinks    Comment: social  . Drug use: No  . Sexual activity: Not on file  Lifestyle  . Physical activity:    Days per week: Not on file    Minutes per session: Not on file  . Stress: Not on file  Relationships  . Social connections:      Talks on phone: Not on file    Gets together: Not on file    Attends religious service: Not on file    Active member of club or organization: Not on file    Attends meetings of clubs or organizations: Not on file    Relationship status: Not on file  . Intimate partner violence:    Fear of current or ex partner: Not on file    Emotionally abused: Not on file    Physically abused: Not on file    Forced sexual activity: Not on file  Other Topics Concern  . Not on file  Social History Narrative  . Not on file     Current Outpatient Medications on File Prior to Visit  Medication Sig Dispense Refill  . BLACK CURRANT SEED OIL PO Take by mouth.    . diltiazem (CARDIZEM CD) 180 MG 24 hr capsule Take 1 capsule (180 mg total) by mouth daily. 30 capsule 5  . furosemide (LASIX) 20 MG tablet Take 1 tablet (20 mg total) by mouth daily as needed for fluid or edema. 30 tablet 5  . metoprolol tartrate (LOPRESSOR) 25 MG tablet Take 1 tablet (25 mg total) by mouth 2 (two) times daily. 60 tablet 5  . spironolactone (ALDACTONE)  25 MG tablet Take 1 tablet (25 mg total) by mouth daily. 30 tablet 5   No current facility-administered medications on file prior to visit.     Cardiovascular studies:  Coronary CT angiogram 10/17/2018: 1. Coronary calcium score of 0. This was 0 percentile for age and sex matched control.  2. Normal coronary origin with right dominance.  3. No evidence of CAD.  4. Moderately dilated pulmonary artery measuring 36 mm suggestive of pulmonary hypertension.   Exercise Treadmill Stress Test 01/12/2018:  Indication: SoB  The patient exercised on Bruce protocol for 4:00 min. Patient achieved  5.84 METS and reached HR  154 bpm, which is   85% of maximum age-predicted HR.  Stress test terminated due to 7/10 CP and Fatigue.  Exercise capacity was below average for age.  HR Response to Exercise: Exaggerated HR response. BP Response to Exercise: Normal resting BP-  appropriate response. Chest Pain: limiting. Arrhythmias: none. ST Changes:  Rest: Sinus rhythm 90 bpm. 1-2 mm T wave inversions leads II, III, aVF Stress: Sinus tachycardia. >2 mm T wave inversions leads II, III, aVF, V5,V 6. <1 mm ST elevation lead aVR. ST changes persist 2 min into recovery.   Overall Impression: Positive stress test typical of diffuse subendocardial ischemia. Consider further cardiac workup.  Echocardiogram 12/28/2017: Left ventricle cavity is normal in size. Severe concentric hypertrophy of the left ventricle. Interventricular septum measures 2.1 cm. Normal global wall motion. Normal diastolic filling pattern. LAP cannot be estimated due to severity of mitral regurgitation.  Calculated EF 69%. Left atrial cavity is severely dilated. 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 Increased velocities across LVOT and aortic valve likely due to LVH and mild SAM. No valvular stenosis. Mild (Grade I) aortic regurgitation.  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 Moderate (Grade III) mitral regurgitation. Mild to moderate tricuspid regurgitation.  No evidence of pulmonary hypertension. Consider hypertrophic cardiomyopathy. No significant change compared to hospital echocardiogram in 02/.2019  Review of Systems  Constitution: Negative for decreased appetite, malaise/fatigue, weight gain and weight loss.  HENT: Negative for congestion.   Eyes: Negative for visual disturbance.  Cardiovascular: Positive for chest pain. Negative for dyspnea on exertion, leg swelling, palpitations and syncope.  Respiratory: Positive for cough and hemoptysis. Negative for shortness of breath.   Endocrine: Negative for cold intolerance.  Hematologic/Lymphatic: Does not bruise/bleed easily.  Skin: Negative for itching and rash.  Musculoskeletal: Negative for myalgias.  Gastrointestinal: Negative for abdominal pain, nausea and vomiting.  Genitourinary:  Negative for dysuria.  Neurological: Negative for dizziness and weakness.  Psychiatric/Behavioral: The patient is not nervous/anxious.   All other systems reviewed and are negative.      Objective:    Vitals:   10/22/18 1148  BP: 116/80  Pulse: 65  SpO2: 100%     Physical Exam  Constitutional: She is oriented to person, place, and time. She appears well-developed and well-nourished. No distress.  HENT:  Head: Normocephalic and atraumatic.  Eyes: Pupils are equal, round, and reactive to light. Conjunctivae are normal.  Neck: No JVD present.  Cardiovascular: Normal rate, regular rhythm and intact distal pulses.  Murmur (II/VI ESM RUSB) heard. Pulmonary/Chest: Effort normal and breath sounds normal. She has no wheezes. She has no rales.  Abdominal: Soft. Bowel sounds are normal. There is no rebound.  Musculoskeletal:        General: No edema.  Lymphadenopathy:    She has no cervical adenopathy.  Neurological: She is alert and oriented to  person, place, and time. No cranial nerve deficit.  Skin: Skin is warm and dry.  Psychiatric: She has a normal mood and affect.  Nursing note and vitals reviewed.       Assessment & Recommendations:    43 year old [place] American female with hypertrophic cardiomyopathy, history of SVT, TIA, atypical chest pain  1. Atypical chest pain Coronary CTA showed no CAD Likely noncardiac pain.  2. Hypertrophic cardiomyopathy (HCC) Septal thickness 2.1 cm. No indication for ICD. Continue metoprolol and diltiazem  3. Chronic heart failure with preserved ejection fraction (HCC) Due to #3. Currently stable.  Lasix only as needed  4. Cough/hemoptysis: While she may have PH, this should not cause hemoptysis. I suspect this may be related to URI. Follow up with PCP after chest Xray/  I will see her back in 6 months  Novalynn Branaman Emiliano Dyer, MD [facility] [place] Pager: [phone] Office: [phone] If no answer Cell  [phone]

## 2018-10-22 NOTE — Patient Instructions (Signed)
Pulmonary Hypertension °Pulmonary hypertension is a long-term (chronic) condition in which there is high blood pressure in the arteries in the lungs (pulmonary arteries). This condition occurs when pulmonary arteries become narrow and tight, making it harder for blood to flow through the lungs. This in turn makes the heart work harder to pump blood through the lungs, making it harder for you to breathe. °Over time, pulmonary hypertension can weaken and damage the heart muscle, specifically the right side of the heart. Pulmonary hypertension is a serious condition that can be life-threatening. °What are the causes? °This condition may be caused by different medical conditions. It can be categorized by cause into five groups: °· Group 1: Pulmonary hypertension that is caused by abnormal growth of small blood vessels in the lungs (pulmonary arterial hypertension). The abnormal blood vessel growth may have no known cause, or it may be: °? Passed from parent to child (hereditary). °? Caused by another disease, such as a connective tissue disease (including lupus or scleroderma), congenital heart disease, liver disease, or HIV. °? Caused by certain medicines or poisons (toxins). °· Group 2: Pulmonary hypertension that is caused by weakness of the left chamber of the heart (left ventricle) or heart valve disease. °· Group 3: Pulmonary hypertension that is caused by lung disease or low oxygen levels. Causes in this group include: °? Emphysema or chronic obstructive pulmonary disease (COPD). °? Untreated sleep apnea. °? Pulmonary fibrosis. °? Long-term exposure to high altitudes in certain people who may already be at higher risk for pulmonary hypertension. °· Group 4: Pulmonary hypertension that is caused by blood clots in the lungs (pulmonary emboli). °· Group 5: Other causes of pulmonary hypertension, such as sickle cell anemia, sarcoidosis, tumors pressing on the pulmonary arteries, and various other diseases. °What are  the signs or symptoms? °Symptoms of this condition include: °· Shortness of breath. You may notice shortness of breath with: °? Activity, such as walking. °? Minimal activity, such as getting dressed. °? No activity, like when you are sitting still. °· A cough. Sometimes, bloody mucus from the lungs may be coughed up (hemoptysis). °· Tiredness and fatigue. °· Dizziness, lightheadedness, or fainting, especially with physical activity. °· Rapid heartbeat, or feeling your heart flutter or skip a beat (palpitations). °· Veins in the neck getting larger. °· Swelling of the lower legs, abdomen, or both. °· Bluish color of the lips and fingertips. °· Chest pain or tightness in the chest. °· Abdominal pain, especially in the upper abdomen. °How is this diagnosed? °This condition may be diagnosed based on one or more of the following tests: °· Chest X-ray. °· Blood tests. °· CT scan. °· Pulmonary function test. This test measures how much air your lungs can hold. It also tests how well air moves in and out of your lungs. °· 6-minute walk test. This tests how severe your condition is in relation to your activity levels. °· Electrocardiogram (ECG). This test records the electrical impulses of the heart. °· Echocardiogram. This test uses sound waves (ultrasound) to produce an image of the heart. °· Cardiac catheterization. This is a procedure in which a thin tube (catheter) is passed into the pulmonary artery and used to test the pressure in your pulmonary artery and the right side of your heart. °· Lung biopsy. This involves having a procedure to remove a small sample of lung tissue for testing. This may help determine an underlying cause of your pulmonary hypertension. °How is this treated? °There is no cure for   this condition, but treatment can help to relieve symptoms and slow the progress of the condition. Treatment may include: °· Cardiac rehabilitation. This is a treatment program that includes exercise training,  education, and counseling to help you get stronger and return to an active lifestyle. °· Oxygen therapy. °· Medicines that: °? Lower blood pressure. °? Relax (dilate) the pulmonary blood vessels. °? Help the heart beat more efficiently and pump more blood. °? Help the body get rid of extra fluid (diuretics). °? Thin the blood in order to prevent blood clots in the lungs. °· Lung surgery to relieve pressure on the heart, for severe cases that do not respond to medical treatment. °· Heart-lung transplant, or lung transplant. This may be done in very severe cases. °Follow these instructions at home: °Eating and drinking ° °· Eat a healthy diet that includes plenty of fresh fruits and vegetables, whole grains, and beans. °· Limit your salt (sodium) intake to less than 2,300 mg a day. °Lifestyle °· Do not use any products that contain nicotine or tobacco, such as cigarettes and e-cigarettes. If you need help quitting, ask your health care provider. °· Avoid secondhand smoke. °Activity °· Get plenty of rest. °· Exercise as directed. Talk with your health care provider about what type of exercise is safe for you. °· Avoid hot tubs and saunas. °· Avoid high altitudes. °General instructions °· Take over-the-counter and prescription medicines only as told by your health care provider. Do not change or stop medicines without checking with your health care provider. °· Stay up to date on your vaccines, especially yearly flu (influenza) and pneumonia vaccines. °· If you are a woman of child-bearing age, avoid becoming pregnant. Talk with your health care provider about birth control. °· Consider ways to get support for anxiety and stress of living with pulmonary hypertension. Talk with your health care provider about support groups and online resources. °· Use oxygen therapy at home as directed. °· Keep track of your weight. Weight gain could be a sign that your condition is getting worse. °· Keep all follow-up visits as told by  your health care provider. This is important. °Contact a health care provider if: °· Your cough gets worse. °· You have more shortness of breath than usual, or you start to have trouble doing activities that you could do before. °· You need to use medicines or oxygen more frequently or in higher dosages than usual. °Get help right away if: °· You have severe shortness of breath. °· You have chest pain or pressure. °· You cough up blood. °· You have swelling of your feet or legs that gets worse. °· You have rapid weight gain over a period of 1-2 days. °· Your medicines or oxygen do not provide relief. °Summary °· Pulmonary hypertension is a chronic condition in which there is high blood pressure in the arteries in the lungs (pulmonary arteries). °· Pulmonary hypertension is a serious condition that can be life-threatening. It can be caused by a variety of illnesses. °· Treatment may involve taking medicines and using oxygen therapy. Severe cases may require surgery or a transplant. °This information is not intended to replace advice given to you by your health care provider. Make sure you discuss any questions you have with your health care provider. °Document Released: 06/05/2007 Document Revised: 11/01/2016 Document Reviewed: 11/01/2016 °Elsevier Interactive Patient Education © 2019 Elsevier Inc. ° °

## 2018-10-22 NOTE — Progress Notes (Signed)
[facility]   Progress Note: Sick Visit Provider: Mike Gip, FNP  SUBJECTIVE:   Lydia Thompson is a 43 y.o. female who  has a past medical history of CHF (congestive heart failure) (HCC) and Hypertension.. Patient presents today for Headache; Emesis; Wheezing; Fatigue; and Cough  Patient reports having vomiting and cough x 2 days. States that she has not taken medications for this. She states that she was given lasix in the past for SOB. States that she did have slight relief of symptoms (swelling of lower extremities, SOB). Patient with a history of CHF. She recently had a recent CT coronary morph w/cta that showed pulmonary hypertension. Patient is going to follow up with cardiology today.  Review of Systems  Constitutional: Negative.   HENT: Negative.   Eyes: Negative.   Respiratory: Positive for cough.   Cardiovascular: Positive for leg swelling.  Gastrointestinal: Negative.   Genitourinary: Negative.   Musculoskeletal: Negative.   Skin: Negative.   Neurological: Negative.   Psychiatric/Behavioral: Negative.      OBJECTIVE: BP 118/72 (BP Location: Right Arm, Patient Position: Sitting, Cuff Size: Large)   Pulse 100   Temp 97.9 F (36.6 C) (Oral)   Ht 5\' 5"  (1.651 m)   Wt 240 lb (108.9 kg)   SpO2 (!) 64%   BMI 39.94 kg/m   Wt Readings from Last 3 Encounters:  10/22/18 240 lb 14.4 oz (109.3 kg)  10/22/18 240 lb (108.9 kg)  09/26/18 233 lb (105.7 kg)     Physical Exam Vitals signs and nursing note reviewed.  Constitutional:      General: She is not in acute distress.    Appearance: She is well-developed.  HENT:     Head: Normocephalic and atraumatic.  Eyes:     Conjunctiva/sclera: Conjunctivae normal.     Pupils: Pupils are equal, round, and reactive to light.  Neck:     Musculoskeletal: Normal range of motion.  Cardiovascular:     Rate and Rhythm: Normal rate and regular rhythm.     Heart sounds: Murmur  present.  Pulmonary:     Effort: Pulmonary effort is normal. No respiratory distress.     Breath sounds: Normal breath sounds.  Abdominal:     General: Bowel sounds are normal. There is no distension.     Palpations: Abdomen is soft.  Musculoskeletal: Normal range of motion.        General: Swelling (bilateral lower extremities) present.  Skin:    General: Skin is warm and dry.  Neurological:     Mental Status: She is alert and oriented to person, place, and time.  Psychiatric:        Mood and Affect: Mood normal.        Speech: Speech normal.        Behavior: Behavior normal.        Thought Content: Thought content normal.     ASSESSMENT/PLAN: 1. Cough - DG Chest 2 View; Future  2. Diastolic dysfunction - spironolactone (ALDACTONE) 25 MG tablet; Take 1 tablet (25 mg total) by mouth daily.  Dispense: 30 tablet; Refill: 5 - DG Chest 2 View; Future   Patient with a hx of CHF who presents to the clinic today for a sick visit. She states that she had cough, fatigue and bilateral edema and responded to taking lasix. She is feeling better today. Will follow up with cardiology this afternoon. Chest xray ordered. Medications refilled.  The patient was given clear instructions to go to ER or return to medical center if symptoms do not improve, worsen or new problems develop. The patient verbalized understanding and agreed with plan of care.   [name], FNP-BC [facility] [address] [place], [place] [address] [phone]     This note has been created with Dragon speech recognition software and smart phrase technology. Any transcriptional errors are unintentional.

## 2018-10-24 ENCOUNTER — Ambulatory Visit: Payer: Self-pay | Admitting: Cardiology

## 2018-10-24 ENCOUNTER — Other Ambulatory Visit: Payer: Self-pay | Admitting: Cardiology

## 2018-10-24 DIAGNOSIS — I422 Other hypertrophic cardiomyopathy: Secondary | ICD-10-CM

## 2018-10-29 MED FILL — DILT XR 180 MG CAPSULE: 180 | 30 days supply | Qty: 30 | Fill #0

## 2018-11-16 MED FILL — SPIRONOLACTONE 25 MG TABLET: 25 | 30 days supply | Qty: 30 | Fill #1

## 2018-11-16 MED FILL — METOPROLOL TARTRATE 25 MG T: 25 | 30 days supply | Qty: 60 | Fill #1

## 2018-12-04 MED FILL — DILT XR 180 MG CAPSULE: 180 | 30 days supply | Qty: 30 | Fill #1

## 2018-12-04 MED FILL — METOPROLOL TARTRATE 25 MG T: 25 | 30 days supply | Qty: 60 | Fill #2

## 2018-12-04 MED FILL — FUROSEMIDE 20 MG TABS: 20 | 30 days supply | Qty: 30 | Fill #0

## 2018-12-04 MED FILL — SPIRONOLACTONE 25 MG TABLET: 25 | 30 days supply | Qty: 30 | Fill #2

## 2018-12-24 ENCOUNTER — Other Ambulatory Visit: Payer: Self-pay

## 2018-12-26 ENCOUNTER — Ambulatory Visit: Payer: Self-pay | Admitting: Family Medicine

## 2019-01-02 MED FILL — METOPROLOL TARTRATE 25 MG T: 25 | 30 days supply | Qty: 60 | Fill #3

## 2019-01-02 MED FILL — FUROSEMIDE 20 MG TABS: 20 | 30 days supply | Qty: 30 | Fill #1

## 2019-01-02 MED FILL — SPIRONOLACTONE 25 MG TABLET: 25 | 30 days supply | Qty: 30 | Fill #3

## 2019-01-02 MED FILL — DILT XR 180 MG CAPSULE: 180 | 30 days supply | Qty: 30 | Fill #2

## 2019-01-04 ENCOUNTER — Other Ambulatory Visit: Payer: Self-pay

## 2019-01-04 ENCOUNTER — Encounter: Payer: Self-pay | Admitting: Family Medicine

## 2019-01-04 ENCOUNTER — Ambulatory Visit (INDEPENDENT_AMBULATORY_CARE_PROVIDER_SITE_OTHER): Payer: Self-pay | Admitting: Family Medicine

## 2019-01-04 DIAGNOSIS — I5189 Other ill-defined heart diseases: Secondary | ICD-10-CM

## 2019-01-04 DIAGNOSIS — E669 Obesity, unspecified: Secondary | ICD-10-CM

## 2019-01-04 DIAGNOSIS — I10 Essential (primary) hypertension: Secondary | ICD-10-CM

## 2019-01-04 NOTE — Progress Notes (Signed)
  [facility]  Virtual Visit via Telephone Note  I connected with Lydia Thompson on 01/04/19 at  8:20 AM EDT by telephone and verified that I am speaking with the correct person using two identifiers.   I discussed the limitations, risks, security and privacy concerns of performing an evaluation and management service by telephone and the availability of in person appointments. I also discussed with the patient that there may be a patient responsible charge related to this service. The patient expressed understanding and agreed to proceed.   History of Present Illness: Lydia Thompson  has a past medical history of CHF (congestive heart failure) (HCC) and Hypertension. Patient reports taking daily weights. She states that she is 236# this morning. She has noticed an increase in her weight and contributes this to her eating habits during the quarantine. She states that she has been consuming more fast foods. She reports compliance with medications. She does check her BP daily and reports that it has been 117/70-120/80 range.    Observations/Objective: Patient with regular voice tone, rate and rhythm. Speaking calmly and is in no apparent distress.    Assessment and Plan: 1. Essential hypertension 2. Obesity (BMI 35.0-39.9 without comorbidity) 3. Diastolic dysfunction No medication changes warranted at the present time.  Patient is to continue with specialists.  The patient is asked to make an attempt to improve diet and exercise patterns to aid in medical management of her comorbidities.      Follow Up Instructions:  We discussed hand washing, using hand sanitizer when soap and water are not available, only going out when absolutely necessary, and social distancing. Explained to patient that she is immunocompromised and will need to take precautions during this time.   I discussed the assessment and treatment plan with the patient. The patient  was provided an opportunity to ask questions and all were answered. The patient agreed with the plan and demonstrated an understanding of the instructions.   The patient was advised to call back or seek an in-person evaluation if the symptoms worsen or if the condition fails to improve as anticipated.  I provided 10 minutes of non-face-to-face time during this encounter.  Ms. Andr L. [name], FNP-BC [facility] [address] [place], [place] [address] [phone]

## 2019-01-17 ENCOUNTER — Other Ambulatory Visit: Payer: Self-pay

## 2019-01-21 ENCOUNTER — Ambulatory Visit: Payer: Self-pay | Admitting: Cardiology

## 2019-01-25 ENCOUNTER — Other Ambulatory Visit: Payer: Self-pay

## 2019-01-25 ENCOUNTER — Ambulatory Visit (INDEPENDENT_AMBULATORY_CARE_PROVIDER_SITE_OTHER): Payer: Self-pay | Admitting: Family Medicine

## 2019-01-25 ENCOUNTER — Ambulatory Visit
Admission: RE | Admit: 2019-01-25 | Discharge: 2019-01-25 | Disposition: A | Discharge status: Home / Self Care | Payer: Self-pay | Source: Ambulatory Visit | Attending: Family Medicine | Admitting: Family Medicine

## 2019-01-25 ENCOUNTER — Encounter: Payer: Self-pay | Admitting: Family Medicine

## 2019-01-25 VITALS — BP 115/64 | HR 93 | Temp 98.6°F | Resp 16 | Ht 65.0 in | Wt 240.0 lb

## 2019-01-25 DIAGNOSIS — M25561 Pain in right knee: Secondary | ICD-10-CM

## 2019-01-25 DIAGNOSIS — M25562 Pain in left knee: Secondary | ICD-10-CM

## 2019-01-25 MED ORDER — NAPROXEN 500 MG PO TABS: 500.0000 mg | ORAL_TABLET | Freq: Two times a day (BID) | ORAL | 0 refills | Status: DC

## 2019-01-25 MED FILL — NAPROXEN 500 MG TABLET: 500 | 15 days supply | Qty: 30 | Fill #0

## 2019-01-25 NOTE — Patient Instructions (Addendum)
I sent the xray order to  [facility] [address], [place]  [phone] Open  Closes 5:30 PM   RICE Therapy for Routine Care of Injuries Many injuries can be cared for with rest, ice, compression, and elevation (RICE therapy). This includes:  Resting the injured part.  Putting ice on the injury.  Putting pressure (compression) on the injury.  Raising the injured part (elevation). Using RICE therapy can help to lessen pain and swelling. Supplies needed:  Ice.  Plastic bag.  Towel.  Elastic bandage.  Pillow or pillows to raise (elevate) your injured body part. How to care for your injury with RICE therapy Rest Limit your normal activities, and try not to use the injured part of your body. You can go back to your normal activities when your doctor says it is okay to do them and you feel okay. Ask your doctor if you should do exercises to help your injury get better. Ice Put ice on the injured area. Do not put ice on your bare skin.  Put ice in a plastic bag.  Place a towel between your skin and the bag.  Leave the ice on for 20 minutes, 2-3 times a day. Use ice on as many days as told by your doctor.  Compression Compression means putting pressure on the injured area. This can be done with an elastic bandage. If an elastic bandage has been put on your injury:  Do not wrap the bandage too tight. Wrap the bandage more loosely if part of your body away from the bandage is blue, swollen, cold, painful, or loses feeling (gets numb).  Take off the bandage and put it on again. Do this every 3-4 hours or as told by your doctor.  See your doctor if the bandage seems to make your problems worse.  Elevation Elevation means keeping the injured area raised. If you can, raise the injured area above your heart or the center of your chest. Contact a doctor if:  You keep having pain and swelling.  Your symptoms get worse. Get help right away if:   You have sudden bad pain at your injury or lower than your injury.  You have redness or more swelling around your injury.  You have tingling or numbness at your injury or lower than your injury, and it does not go away when you take off the bandage. Summary  Many injuries can be cared for using rest, ice, compression, and elevation (RICE therapy).  You can go back to your normal activities when you feel okay and your doctor says it is okay.  Put ice on the injured area as told by your doctor.  Get help if your symptoms get worse or if you keep having pain and swelling. This information is not intended to replace advice given to you by your health care provider. Make sure you discuss any questions you have with your health care provider. Document Released: 01/25/2008 Document Revised: 04/28/2017 Document Reviewed: 04/28/2017 Elsevier Interactive Patient Education  2019 Elsevier Inc. Acute Knee Pain, Adult Many things can cause knee pain. Sometimes, knee pain is sudden (acute) and may be caused by damage, swelling, or irritation of the muscles and tissues that support your knee. The pain often goes away on its own with time and rest. If the pain does not go away, tests may be done to find out what is causing the pain. Follow these instructions at home: Pay attention to any changes in your  symptoms. Take these actions to relieve your pain. If you have a knee sleeve or brace:   Wear the sleeve or brace as told by your doctor. Remove it only as told by your doctor.  Loosen the sleeve or brace if your toes: ? Tingle. ? Become numb. ? Turn cold and blue.  Keep the sleeve or brace clean.  If the sleeve or brace is not waterproof: ? Do not let it get wet. ? Cover it with a watertight covering when you take a bath or shower. Activity  Rest your knee.  Do not do things that cause pain.  Avoid activities where both feet leave the ground at the same time (high-impact activities).  Examples are running, jumping rope, and doing jumping jacks.  Work with a physical therapist to make a safe exercise program, as told by your doctor. Managing pain, stiffness, and swelling   If told, put ice on the knee: ? Put ice in a plastic bag. ? Place a towel between your skin and the bag. ? Leave the ice on for 20 minutes, 2-3 times a day.  If told, put pressure (compression) on your injured knee to control swelling, give support, and help with discomfort. Compression may be done with an elastic bandage. General instructions  Take all medicines only as told by your doctor.  Raise (elevate) your knee while you are sitting or lying down. Make sure your knee is higher than your heart.  Sleep with a pillow under your knee.  Do not use any products that contain nicotine or tobacco. These include cigarettes, e-cigarettes, and chewing tobacco. These products may slow down healing. If you need help quitting, ask your doctor.  If you are overweight, work with your doctor and a food expert (dietitian) to set goals to lose weight. Being overweight can make your knee hurt more.  Keep all follow-up visits as told by your doctor. This is important. Contact a doctor if:  The knee pain does not stop.  The knee pain changes or gets worse.  You have a fever along with knee pain.  Your knee feels warm when you touch it.  Your knee gives out or locks up. Get help right away if:  Your knee swells, and the swelling gets worse.  You cannot move your knee.  You have very bad knee pain. Summary  Many things can cause knee pain. The pain often goes away on its own with time and rest.  Your doctor may do tests to find out the cause of the pain.  Pay attention to any changes in your symptoms. Relieve your pain with rest, medicines, light activity, and use of ice.  Get help right away if you cannot move your knee or your knee pain is very bad. This information is not intended to replace  advice given to you by your health care provider. Make sure you discuss any questions you have with your health care provider. Document Released: 11/04/2008 Document Revised: 01/18/2018 Document Reviewed: 01/18/2018 Elsevier Interactive Patient Education  2019 [employer]

## 2019-01-25 NOTE — Progress Notes (Signed)
[facility]   Progress Note: Sick Visit Provider: Mike Gip, FNP  SUBJECTIVE:   Lydia Thompson is a 43 y.o. female who  has a past medical history of CHF (congestive heart failure) (HCC) and Hypertension.. Patient presents today for Leg Swelling (patient states "pockets of fluid around knees" ) and Leg Pain (right leg )  She reports feeling a "fluid pocket" on the medial portion of both knees.   Knee Pain   The incident occurred 3 to 5 days ago. There was no injury mechanism. The pain is present in the left knee and right knee. The quality of the pain is described as aching. The pain is at a severity of 3/10. The pain is mild. The pain has been fluctuating since onset. The symptoms are aggravated by movement and weight bearing. She has tried nothing for the symptoms.    Review of Systems  Musculoskeletal: Positive for joint pain.  All other systems reviewed and are negative.    OBJECTIVE: BP 115/64 (BP Location: Right Arm, Patient Position: Sitting, Cuff Size: Large)   Pulse 93   Temp 98.6 F (37 C) (Oral)   Resp 16   Ht 5\' 5"  (1.651 m)   Wt 240 lb (108.9 kg)   SpO2 98%   BMI 39.94 kg/m   Wt Readings from Last 3 Encounters:  01/25/19 240 lb (108.9 kg)  10/22/18 240 lb 14.4 oz (109.3 kg)  10/22/18 240 lb (108.9 kg)     Physical Exam Vitals signs and nursing note reviewed.  Constitutional:      General: She is not in acute distress.    Appearance: Normal appearance.  HENT:     Head: Normocephalic and atraumatic.  Eyes:     Extraocular Movements: Extraocular movements intact.     Conjunctiva/sclera: Conjunctivae normal.     Pupils: Pupils are equal, round, and reactive to light.  Cardiovascular:     Rate and Rhythm: Normal rate.     Heart sounds: No murmur.  Pulmonary:     Effort: Pulmonary effort is normal.  Musculoskeletal: Normal range of motion.     Right knee: Tenderness found. Medial joint line tenderness  noted.     Left knee: Tenderness found. Medial joint line tenderness noted.  Skin:    General: Skin is warm and dry.  Neurological:     Mental Status: She is alert and oriented to person, place, and time.  Psychiatric:        Mood and Affect: Mood normal.        Behavior: Behavior normal.        Thought Content: Thought content normal.        Judgment: Judgment normal.     ASSESSMENT/PLAN:  1. Acute pain of both knees RICE therapy. Imaging pending. Will  - AMB referral to orthopedics - DG Knee Complete 4 Views Left; Future - DG Knee Complete 4 Views Right; Future - naproxen (NAPROSYN) 500 MG tablet; Take 1 tablet (500 mg total) by mouth 2 (two) times daily with a meal.  Dispense: 30 tablet; Refill: 0        The patient was given clear instructions to go to ER or return to medical center if symptoms do not improve, worsen or new problems develop. The patient verbalized understanding and agreed with plan of care.   Lydia Thompson. [name], FNP-BC [facility] [address] [place], [place] [address] [phone]  This note has been created with Survey[employer], quantity. Any transcriptional errors are unintentional.

## 2019-01-31 MED FILL — DILT XR 180 MG CAPSULE: 180 | 30 days supply | Qty: 30 | Fill #3

## 2019-02-01 ENCOUNTER — Ambulatory Visit (INDEPENDENT_AMBULATORY_CARE_PROVIDER_SITE_OTHER): Payer: Self-pay

## 2019-02-01 ENCOUNTER — Other Ambulatory Visit: Payer: Self-pay

## 2019-02-01 DIAGNOSIS — I422 Other hypertrophic cardiomyopathy: Secondary | ICD-10-CM

## 2019-02-01 MED FILL — FUROSEMIDE 20 MG TABS: 20 | 30 days supply | Qty: 30 | Fill #2

## 2019-02-01 MED FILL — SPIRONOLACTONE 25 MG TABLET: 25 | 30 days supply | Qty: 30 | Fill #4

## 2019-02-01 MED FILL — METOPROLOL TARTRATE 25 MG T: 25 | 30 days supply | Qty: 60 | Fill #4

## 2019-02-06 ENCOUNTER — Ambulatory Visit: Payer: Self-pay

## 2019-02-06 ENCOUNTER — Ambulatory Visit (INDEPENDENT_AMBULATORY_CARE_PROVIDER_SITE_OTHER): Payer: Self-pay | Admitting: Family Medicine

## 2019-02-06 ENCOUNTER — Other Ambulatory Visit: Payer: Self-pay

## 2019-02-06 VITALS — BP 122/70 | Ht 62.0 in | Wt 240.0 lb

## 2019-02-06 DIAGNOSIS — M25561 Pain in right knee: Secondary | ICD-10-CM

## 2019-02-06 DIAGNOSIS — M25562 Pain in left knee: Secondary | ICD-10-CM

## 2019-02-06 NOTE — Patient Instructions (Signed)
Your ultrasound and exam are reassuring. Call us if you develop any pain or have any other concerns otherwise follow up as needed.

## 2019-02-06 NOTE — Progress Notes (Signed)
   HPI  CC: Swelling of both knees  Lydia Thompson is a 43 year old female with congestive heart failure referred for swelling of both knees.  She states she noticed swelling over the last several months.  The swelling is above her knee and on the medial side below her knee.  She has extensive history of congestive heart failure, with hospital admissions in the past for exacerbations.  She states her edema has been pretty well controlled lately on furosemide.  She states she has no pain in her knees.  She has no issues with her knees in general.  She does notice the swelling and was worried about it due to her CHF.  She denies any locking or catching of her knees.  She denies any giving out of her knees.  She denies any recent fevers or chills.  She denies any cough.  She denies any nighttime cough.  She has no history of trauma to the knee in the past.  She has no any inciting event that would lead to issues of the knee.  Past Surgeries: None Smoking: Denies Family Hx: Family history of congestive heart failure  ROS: Per HPI; in addition no fever, no rash, no additional weakness, no additional numbness, no additional paresthesias, and no additional falls/injury.   All past medical history, medications, and allergies reviewed myself at today's visit.  Objective: BP 122/70   Ht 5\' 2"  (1.575 m)   Wt 240 lb (108.9 kg)   BMI 43.90 kg/m  Gen:  NAD, well groomed, a/o x3, normal affect.  CV: Well-perfused. Warm.  Resp: Non-labored.  Neuro: Sensation intact throughout. No gross coordination deficits.  Gait: Nonpathologic posture, unremarkable stride without signs of limp or balance issues.  Left knee exam: No erythema or warmth noted pitting edema up to mid tibia, 1+.  No swelling noted in the joint space of the knee.  Full range of motion knee flexion extension.  Strength 5 out of 5 throughout testing.  No tenderness to palpation.  Negative ligamentous testing.  Negative McMurray test.  Right knee  exam:No erythema or warmth noted pitting edema up to mid tibia, 1+.  No swelling noted in the joint space of the knee.  Full range of motion knee flexion extension.  Strength 5 out of 5 throughout testing.  No tenderness to palpation.  Negative ligamentous testing.  Negative McMurray test.  ULTRASOUND: Knee, bilateral Diagnostic limited ultrasound imaging obtained of patient's lateral knee.  - Quadriceps tendon: No appreciated signs of tearing, edema, or calcification. No (compressible) fluid/edema noted within the suprapatellar pouch. - Pes Anserine: No evidence of integrity loss or abnormal fluid presence.  IMPRESSION: findings consistent with normal knee.  Assessment and Plan: Bilateral knee swelling  We discussed treatment options with the patient today's visit.  We believe the swelling she is experiencing is adipose tissue deposition.  She had no signs of fluid retention or intra-articular fluid on ultrasound.  We did advise her that if she has any pain in her knees, or if she notices any significant swelling, to call us.  She can follow-up as needed in clinic otherwise.   Lewanda Rife, MD [facility] 02/06/2019 3:46 PM

## 2019-02-07 ENCOUNTER — Encounter: Payer: Self-pay | Admitting: Family Medicine

## 2019-02-13 ENCOUNTER — Ambulatory Visit: Payer: Self-pay | Admitting: Cardiology

## 2019-03-04 MED FILL — SPIRONOLACTONE 25 MG TABLET: 25 | 30 days supply | Qty: 30 | Fill #5

## 2019-03-04 MED FILL — METOPROLOL TARTRATE 25 MG T: 25 | 30 days supply | Qty: 60 | Fill #5

## 2019-03-04 MED FILL — DILT XR 180 MG CAPSULE: 180 | 30 days supply | Qty: 30 | Fill #4

## 2019-03-13 ENCOUNTER — Other Ambulatory Visit: Payer: Self-pay

## 2019-03-13 DIAGNOSIS — Z20822 Contact with and (suspected) exposure to covid-19: Secondary | ICD-10-CM

## 2019-03-17 LAB — NOVEL CORONAVIRUS, NAA: SARS-CoV-2, NAA: NOT DETECTED

## 2019-03-21 ENCOUNTER — Telehealth: Payer: Self-pay | Admitting: Family Medicine

## 2019-03-21 NOTE — Telephone Encounter (Signed)
Informed pt of negative covid result   °

## 2019-03-28 ENCOUNTER — Telehealth: Payer: Self-pay

## 2019-03-28 NOTE — Telephone Encounter (Signed)
Telephone encounter:  Reason for call: chest pain on exertion/DOE For 2 days, says she feels like she gained weight, she has been walking a mile and a half per day  Usual provider: [facility]  Last office visit: 10/22/18  Next office visit: 05/01/19   Last hospitalization: n/a  Current Outpatient Medications on File Prior to Visit  Medication Sig Dispense Refill  . diltiazem (CARDIZEM CD) 180 MG 24 hr capsule Take 1 capsule (180 mg total) by mouth daily. 30 capsule 5  . furosemide (LASIX) 20 MG tablet Take 1 tablet (20 mg total) by mouth daily as needed for fluid or edema. 30 tablet 5  . metoprolol tartrate (LOPRESSOR) 25 MG tablet Take 1 tablet (25 mg total) by mouth 2 (two) times daily. 60 tablet 5  . naproxen (NAPROSYN) 500 MG tablet Take 1 tablet (500 mg total) by mouth 2 (two) times daily with a meal. 30 tablet 0  . spironolactone (ALDACTONE) 25 MG tablet Take 1 tablet (25 mg total) by mouth daily. 30 tablet 5   No current facility-administered medications on file prior to visit.

## 2019-03-28 NOTE — Telephone Encounter (Signed)
Take an additional pill for next 3-4 days and see if it improves the swelling.  Thanks MJP

## 2019-03-28 NOTE — Telephone Encounter (Signed)
Her CTA that shows no coronary artery blockages. Chest pain is unlikely to be due to blockages. Some pain can occur on exertion with her hypertrophic cardiomyopathy. Ask her to take lasix daily for next 3-4 days.  Thanks MJP

## 2019-03-28 NOTE — Telephone Encounter (Signed)
Pt states she has taken one every day this week for swelling  she also states she drinks a lot of water.

## 2019-04-05 MED FILL — DILTIAZEM 24HR ER 180 MG CA: 180 | 30 days supply | Qty: 30 | Fill #0

## 2019-04-05 MED FILL — SPIRONOLACTONE 25 MG TABLET: 25 | 30 days supply | Qty: 30 | Fill #1

## 2019-04-08 ENCOUNTER — Encounter: Payer: Self-pay | Admitting: Family Medicine

## 2019-04-08 ENCOUNTER — Ambulatory Visit (INDEPENDENT_AMBULATORY_CARE_PROVIDER_SITE_OTHER): Payer: Self-pay | Admitting: Family Medicine

## 2019-04-08 ENCOUNTER — Ambulatory Visit: Payer: Self-pay | Admitting: Family Medicine

## 2019-04-08 ENCOUNTER — Other Ambulatory Visit: Payer: Self-pay

## 2019-04-08 VITALS — BP 120/68 | HR 72 | Temp 98.9°F | Resp 18 | Wt 240.0 lb

## 2019-04-08 DIAGNOSIS — I5189 Other ill-defined heart diseases: Secondary | ICD-10-CM

## 2019-04-08 DIAGNOSIS — E669 Obesity, unspecified: Secondary | ICD-10-CM

## 2019-04-08 DIAGNOSIS — I10 Essential (primary) hypertension: Secondary | ICD-10-CM

## 2019-04-08 NOTE — Patient Instructions (Signed)
Low-Sodium Eating Plan Sodium, which is an element that makes up salt, helps you maintain a healthy balance of fluids in your body. Too much sodium can increase your blood pressure and cause fluid and waste to be held in your body. Your health care provider or dietitian may recommend following this plan if you have high blood pressure (hypertension), kidney disease, liver disease, or heart failure. Eating less sodium can help lower your blood pressure, reduce swelling, and protect your heart, liver, and kidneys. What are tips for following this plan? General guidelines  Most people on this plan should limit their sodium intake to 1,500-2,000 mg (milligrams) of sodium each day. Reading food labels   The Nutrition Facts label lists the amount of sodium in one serving of the food. If you eat more than one serving, you must multiply the listed amount of sodium by the number of servings.  Choose foods with less than 140 mg of sodium per serving.  Avoid foods with 300 mg of sodium or more per serving. Shopping  Look for lower-sodium products, often labeled as "low-sodium" or "no salt added."  Always check the sodium content even if foods are labeled as "unsalted" or "no salt added".  Buy fresh foods. ? Avoid canned foods and premade or frozen meals. ? Avoid canned, cured, or processed meats  Buy breads that have less than 80 mg of sodium per slice. Cooking  Eat more home-cooked food and less restaurant, buffet, and fast food.  Avoid adding salt when cooking. Use salt-free seasonings or herbs instead of table salt or sea salt. Check with your health care provider or pharmacist before using salt substitutes.  Cook with plant-based oils, such as canola, sunflower, or olive oil. Meal planning  When eating at a restaurant, ask that your food be prepared with less salt or no salt, if possible.  Avoid foods that contain MSG (monosodium glutamate). MSG is sometimes added to [place] food,  bouillon, and some canned foods. What foods are recommended? The items listed may not be a complete list. Talk with your dietitian about what dietary choices are best for you. Grains Low-sodium cereals, including oats, puffed wheat and rice, and shredded wheat. Low-sodium crackers. Unsalted rice. Unsalted pasta. Low-sodium bread. Whole-grain breads and whole-grain pasta. Vegetables Fresh or frozen vegetables. "No salt added" canned vegetables. "No salt added" tomato sauce and paste. Low-sodium or reduced-sodium tomato and vegetable juice. Fruits Fresh, frozen, or canned fruit. Fruit juice. Meats and other protein foods Fresh or frozen (no salt added) meat, poultry, seafood, and fish. Low-sodium canned tuna and salmon. Unsalted nuts. Dried peas, beans, and lentils without added salt. Unsalted canned beans. Eggs. Unsalted nut butters. Dairy Milk. Soy milk. Cheese that is naturally low in sodium, such as ricotta cheese, fresh mozzarella, or Swiss cheese Low-sodium or reduced-sodium cheese. Cream cheese. Yogurt. Fats and oils Unsalted butter. Unsalted margarine with no trans fat. Vegetable oils such as canola or olive oils. Seasonings and other foods Fresh and dried herbs and spices. Salt-free seasonings. Low-sodium mustard and ketchup. Sodium-free salad dressing. Sodium-free light mayonnaise. Fresh or refrigerated horseradish. Lemon juice. Vinegar. Homemade, reduced-sodium, or low-sodium soups. Unsalted popcorn and pretzels. Low-salt or salt-free chips. What foods are not recommended? The items listed may not be a complete list. Talk with your dietitian about what dietary choices are best for you. Grains Instant hot cereals. Bread stuffing, pancake, and biscuit mixes. Croutons. Seasoned rice or pasta mixes. Noodle soup cups. Boxed or frozen macaroni and cheese. Regular salted  crackers. Self-rising flour. Vegetables Sauerkraut, pickled vegetables, and relishes. Olives. [place] fries. Onion rings.  Regular canned vegetables (not low-sodium or reduced-sodium). Regular canned tomato sauce and paste (not low-sodium or reduced-sodium). Regular tomato and vegetable juice (not low-sodium or reduced-sodium). Frozen vegetables in sauces. Meats and other protein foods Meat or fish that is salted, canned, smoked, spiced, or pickled. Bacon, ham, sausage, hotdogs, corned beef, chipped beef, packaged lunch meats, salt pork, jerky, pickled herring, anchovies, regular canned tuna, sardines, salted nuts. Dairy Processed cheese and cheese spreads. Cheese curds. Blue cheese. Feta cheese. String cheese. Regular cottage cheese. Buttermilk. Canned milk. Fats and oils Salted butter. Regular margarine. Ghee. Bacon fat. Seasonings and other foods Onion salt, garlic salt, seasoned salt, table salt, and sea salt. Canned and packaged gravies. Worcestershire sauce. Tartar sauce. Barbecue sauce. Teriyaki sauce. Soy sauce, including reduced-sodium. Steak sauce. Fish sauce. Oyster sauce. Cocktail sauce. Horseradish that you find on the shelf. Regular ketchup and mustard. Meat flavorings and tenderizers. Bouillon cubes. Hot sauce and Tabasco sauce. Premade or packaged marinades. Premade or packaged taco seasonings. Relishes. Regular salad dressings. Salsa. Potato and tortilla chips. Corn chips and puffs. Salted popcorn and pretzels. Canned or dried soups. Pizza. Frozen entrees and pot pies. Summary  Eating less sodium can help lower your blood pressure, reduce swelling, and protect your heart, liver, and kidneys.  Most people on this plan should limit their sodium intake to 1,500-2,000 mg (milligrams) of sodium each day.  Canned, boxed, and frozen foods are high in sodium. Restaurant foods, fast foods, and pizza are also very high in sodium. You also get sodium by adding salt to food.  Try to cook at home, eat more fresh fruits and vegetables, and eat less fast food, canned, processed, or prepared foods. This information is  not intended to replace advice given to you by your health care provider. Make sure you discuss any questions you have with your health care provider. Document Released: 01/28/2002 Document Revised: 07/21/2017 Document Reviewed: 08/01/2016 Elsevier Patient Education  2020 [employer]. Heart Failure and Exercise Heart failure is a condition in which the heart does not fill or pump enough blood and oxygen to support your body and its functions. Heart failure is a long-term (chronic) condition. Living with heart failure can be challenging. However, following your health care provider's instructions about a healthy lifestyle may help improve your symptoms. This includes choosing the right exercise plan. Doing daily physical activity is important after a diagnosis of heart failure. You may have some activity restrictions, so talk to your health care provider before doing any exercises. What are the benefits of exercise? Exercise may:  Make your heart muscles stronger.  Lower your blood pressure.  Lower your cholesterol.  Help you lose weight.  Help your bones stay strong.  Improve your blood circulation.  Help your body use oxygen better. This relieves symptoms such as fatigue and shortness of breath.  Help your mental health by lowering the risk of depression and other problems.  Improve your quality of life.  Decrease your chance of hospital admission for heart failure. What is an exercise plan? An exercise plan is a set of specific exercises and training activities. You will work with your health care provider to create the exercise plan that works for you. The plan may include:  Different types of exercises and how to do them.  Cardiac rehabilitation exercises. These are supervised programs that are designed to strengthen your heart. What are strengthening exercises? Strengthening exercises are  a type of physical activity that involves using resistance to improve your muscle  strength. Strengthening exercises usually have repetitive motions. These types of exercises can include:  Lifting weights.  Using weight machines.  Using resistance tubes and bands.  Using kettlebells.  Using your body weight, such as doing push-ups or squats. What are balance exercises? Balance exercises are another type of physical activity. They strengthen the muscles of the back, stomach, and pelvis (core muscles) and improve your balance. They can also lower your risk of falling. These types of exercises can include:  Standing on one leg.  Walking backward, sideways, and in a straight line.  Standing up after sitting, without using your hands.  Shifting your weight from one leg to the other.  Lifting one leg in front of you.  Doing tai chi. This is a type of exercise that uses slow movements and deep breathing. How can I increase my flexibility? Having better flexibility can keep you from falling. It can also lengthen your muscles, improve your range of motion, and help your joints. You can increase your flexibility by:  Doing tai chi.  Doing yoga.  Stretching. How much aerobic exercise should I get?  Aerobic exercises strengthen your breathing and circulation system and increase your body's use of oxygen. Examples of aerobic exercise include biking, walking, running, and swimming. Talk to your health care provider to find out how much aerobic exercise is safe for you.  To do these exercises:  Start exercising slowly, limiting the amount of time at first. You may need to start with 5 minutes of aerobic exercise every day.  Slowly add more minutes until you can safely do at least 30 minutes of exercise at least 4 days a week. Summary  Daily physical activity is important after a diagnosis of heart failure.  Exercise can make your heart muscles stronger. It also offers other benefits that will improve your health.  Talk to your health care provider before doing any  exercises. This information is not intended to replace advice given to you by your health care provider. Make sure you discuss any questions you have with your health care provider. Document Released: 12/20/2016 Document Revised: 12/23/2016 Document Reviewed: 12/20/2016 Elsevier Patient Education  2020 [employer].

## 2019-04-08 NOTE — Progress Notes (Signed)
  Patient [facility]   Progress Note: General Provider: Lanae Boast, FNP  SUBJECTIVE:   Lydia Thompson is a 43 y.o. female who  has a past medical history of CHF (congestive heart failure) ([place]) and Hypertension.. Patient presents today for Follow-up Patient reports compliance with all medications.  Patient denies side effects of medications. She states that she has been eating more fast food and foods high in sodium. She is followed by Dr. Virgina Jock in cardiology for CHF. She was seen by Dr. Timothy Lasso for bilateral knee pain and swelling. She states that there was no swelling noted. The increase was adipose tissue. She reports checking her weights and states that her water weight is stable, but she has gained 20 pounds due to unhealthy eating habits.    Review of Systems  Constitutional: Negative.   HENT: Negative.   Eyes: Negative.   Respiratory: Negative.   Cardiovascular: Negative.   Gastrointestinal: Negative.   Genitourinary: Negative.   Musculoskeletal: Negative.   Skin: Negative.   Neurological: Negative.   Psychiatric/Behavioral: Negative.      OBJECTIVE: BP 120/68 (BP Location: Left Arm, Patient Position: Sitting)   Pulse 72   Temp 98.9 F (37.2 C) (Oral)   Resp 18   Wt 240 lb (108.9 kg)   SpO2 100%   BMI 43.90 kg/m   Wt Readings from Last 3 Encounters:  04/08/19 240 lb (108.9 kg)  02/06/19 240 lb (108.9 kg)  01/25/19 240 lb (108.9 kg)     Physical Exam Vitals signs and nursing note reviewed.  Constitutional:      General: She is not in acute distress.    Appearance: Normal appearance.  HENT:     Head: Normocephalic and atraumatic.  Eyes:     Extraocular Movements: Extraocular movements intact.     Conjunctiva/sclera: Conjunctivae normal.     Pupils: Pupils are equal, round, and reactive to light.  Cardiovascular:     Rate and Rhythm: Normal rate and regular rhythm.     Heart sounds: No murmur.  Pulmonary:   Effort: Pulmonary effort is normal.     Breath sounds: Normal breath sounds.  Musculoskeletal: Normal range of motion.  Skin:    General: Skin is warm and dry.  Neurological:     Mental Status: She is alert and oriented to person, place, and time.  Psychiatric:        Mood and Affect: Mood normal.        Behavior: Behavior normal.        Thought Content: Thought content normal.        Judgment: Judgment normal.     ASSESSMENT/PLAN:   1. Essential hypertension  2. Obesity (BMI 35.0-39.9 without comorbidity)  3. Diastolic dysfunction Patient to continue with follow up visits with specialists. She is encouraged to limit her sodium intake, remain hydrated, and to exercise as tolerated. Discussed signs and symptoms of fluid overload with patient.    Return in about 6 months (around 10/09/2019) for HTN .    The patient was given clear instructions to go to ER or return to medical center if symptoms do not improve, worsen or new problems develop. The patient verbalized understanding and agreed with plan of care.   [name], FNP-BC Patient [facility] [address] [place], [place] [address] [phone]

## 2019-04-12 ENCOUNTER — Ambulatory Visit: Payer: Self-pay | Admitting: Family Medicine

## 2019-04-28 DIAGNOSIS — I422 Other hypertrophic cardiomyopathy: Secondary | ICD-10-CM | POA: Insufficient documentation

## 2019-04-28 NOTE — Progress Notes (Signed)
Patient is here for follow up visit.  Subjective:   Lydia Thompson, female    DOB: 05-May-1976, 43 y.o.   MRN: [id number]   Chief Complaint  Patient presents with  . Cardiomyopathy  . Follow-up    6 month     HPI  43 year old African American female with hypertrophic cardiomyopathy, history of SVT, TIA, abnormal stress test, here for follow up.  Given her abnormal exercise treadmill stress test, I recommended coronary CT angiogram for anatomic evaluation.  This showed no coronary artery disease with normal coronaries.  Pulmonary artery was dilated at 3.6 cm suggestive of pulmonary hypertension.  Patient is here for follow up today. She has occasional episodes of chest pain, but is overall doing better with her physical activity. Sh is trying to lose weight.   Past Medical History:  Diagnosis Date  . CHF (congestive heart failure) (HCC)   . Hypertension      Past Surgical History:  Procedure Laterality Date  . ABDOMINAL HYSTERECTOMY       Social History   Socioeconomic History  . Marital status: Single    Spouse name: Not on file  . Number of children: Not on file  . Years of education: Not on file  . Highest education level: Not on file  Occupational History  . Not on file  Social Needs  . Financial resource strain: Not on file  . Food insecurity    Worry: Not on file    Inability: Not on file  . Transportation needs    Medical: Not on file    Non-medical: Not on file  Tobacco Use  . Smoking status: Never Smoker  . Smokeless tobacco: Never Used  Substance and Sexual Activity  . Alcohol use: Yes    Alcohol/week: 0.0 standard drinks    Comment: social  . Drug use: No  . Sexual activity: Not on file  Lifestyle  . Physical activity    Days per week: Not on file    Minutes per session: Not on file  . Stress: Not on file  Relationships  . Social Musicianconnections    Talks on phone: Not on file    Gets together: Not on file    Attends religious service:  Not on file    Active member of club or organization: Not on file    Attends meetings of clubs or organizations: Not on file    Relationship status: Not on file  . Intimate partner violence    Fear of current or ex partner: Not on file    Emotionally abused: Not on file    Physically abused: Not on file    Forced sexual activity: Not on file  Other Topics Concern  . Not on file  Social History Narrative  . Not on file     Current Outpatient Medications on File Prior to Visit  Medication Sig Dispense Refill  . diltiazem (CARDIZEM CD) 180 MG 24 hr capsule Take 1 capsule (180 mg total) by mouth daily. 30 capsule 5  . furosemide (LASIX) 20 MG tablet Take 1 tablet (20 mg total) by mouth daily as needed for fluid or edema. 30 tablet 5  . metoprolol tartrate (LOPRESSOR) 25 MG tablet Take 1 tablet (25 mg total) by mouth 2 (two) times daily. 60 tablet 5  . naproxen (NAPROSYN) 500 MG tablet Take 1 tablet (500 mg total) by mouth 2 (two) times daily with a meal. 30 tablet 0  . spironolactone (ALDACTONE) 25 MG  tablet Take 1 tablet (25 mg total) by mouth daily. 30 tablet 5   No current facility-administered medications on file prior to visit.     Cardiovascular studies:  EKG 05/01/2019: Sinus bradycardia 50 bpm. Left atrial enlargement.   Echocardiogram 02/01/2019: Normal LV systolic function with EF 56%. Left ventricle cavity is normal in size. Severe asymmetric hypertrophy of the left ventricle. Resting LVOT max gradient 12 mmHg.  Normal global wall motion. Indeterminate diastolic filling pattern.  Left atrial cavity is severely dilated. Trileaflet aortic valve. No significant valvular stenosis. Trace aortic regurgitation. Moderate (Grade III) mitral regurgitation. Mild tricuspid regurgitation. Estimated pulmonary artery systolic pressure 24 mmHg. No significant change compared to previous study on 01/07/2018.  Coronary CT angiogram 10/17/2018: 1. Coronary calcium score of 0. This was 0  percentile for age and sex matched control. 2. Normal coronary origin with right dominance. 3. No evidence of CAD. 4. Moderately dilated pulmonary artery measuring 36 mm suggestive of pulmonary hypertension.   Exercise Treadmill Stress Test 01/12/2018:  Indication: SoB  The patient exercised on Bruce protocol for 4:00 min. Patient achieved  5.84 METS and reached HR  154 bpm, which is   85% of maximum age-predicted HR.  Stress test terminated due to 7/10 CP and Fatigue.  Exercise capacity was below average for age.  HR Response to Exercise: Exaggerated HR response. BP Response to Exercise: Normal resting BP- appropriate response. Chest Pain: limiting. Arrhythmias: none. ST Changes:  Rest: Sinus rhythm 90 bpm. 1-2 mm T wave inversions leads II, III, aVF Stress: Sinus tachycardia. >2 mm T wave inversions leads II, III, aVF, V5,V 6. <1 mm ST elevation lead aVR. ST changes persist 2 min into recovery.   Overall Impression: Positive stress test typical of diffuse subendocardial ischemia. Consider further cardiac workup.  Echocardiogram 12/28/2017: Left ventricle cavity is normal in size. Severe concentric hypertrophy of the left ventricle. Interventricular septum measures 2.1 cm. Normal global wall motion. Normal diastolic filling pattern. LAP cannot be estimated due to severity of mitral regurgitation.  Calculated EF 69%. Left atrial cavity is severely dilated. 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 Increased velocities across LVOT and aortic valve likely due to LVH and mild SAM. No valvular stenosis. Mild (Grade I) aortic regurgitation.  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 Moderate (Grade III) mitral regurgitation. Mild to moderate tricuspid regurgitation.  No evidence of pulmonary hypertension. Consider hypertrophic cardiomyopathy. No significant change compared to hospital echocardiogram in 02/.2019  Review of Systems   Constitution: Negative for decreased appetite, malaise/fatigue, weight gain and weight loss.  HENT: Negative for congestion.   Eyes: Negative for visual disturbance.  Cardiovascular: Positive for chest pain. Negative for dyspnea on exertion, leg swelling, palpitations and syncope.  Respiratory: Positive for cough and hemoptysis. Negative for shortness of breath.   Endocrine: Negative for cold intolerance.  Hematologic/Lymphatic: Does not bruise/bleed easily.  Skin: Negative for itching and rash.  Musculoskeletal: Negative for myalgias.  Gastrointestinal: Negative for abdominal pain, nausea and vomiting.  Genitourinary: Negative for dysuria.  Neurological: Negative for dizziness and weakness.  Psychiatric/Behavioral: The patient is not nervous/anxious.   All other systems reviewed and are negative.      Objective:   Vitals:   05/01/19 0840  BP: 100/62  Pulse: (!) 53  Temp: 97.7 F (36.5 C)  SpO2: 98%     Physical Exam  Constitutional: She is oriented to person, place, and time. She appears well-developed and well-nourished. No distress.  HENT:  Head: Normocephalic  and atraumatic.  Eyes: Pupils are equal, round, and reactive to light. Conjunctivae are normal.  Neck: No JVD present.  Cardiovascular: Normal rate, regular rhythm and intact distal pulses.  Murmur (II/VI ESM RUSB) heard. Pulmonary/Chest: Effort normal and breath sounds normal. She has no wheezes. She has no rales.  Abdominal: Soft. Bowel sounds are normal. There is no rebound.  Musculoskeletal:        General: No edema.  Lymphadenopathy:    She has no cervical adenopathy.  Neurological: She is alert and oriented to person, place, and time. No cranial nerve deficit.  Skin: Skin is warm and dry.  Psychiatric: She has a normal mood and affect.  Nursing note and vitals reviewed.       Assessment & Recommendations:    43 year old [place] American female with hypertrophic cardiomyopathy, history of SVT, TIA,  atypical chest pain  1. Atypical chest pain Coronary CTA showed no CAD Likely noncardiac pain.  2. Hypertrophic cardiomyopathy (HCC) Septal thickness 2.1 cm. No indication for ICD. Continue metoprolol and diltiazem  3. Chronic heart failure with preserved ejection fraction (HCC) Due to #2. Currently stable.  Tolerating low ose spironolactone.  4. H/o TIA: Continue Aspirin 81 mg daily.  I will see her back in 6 months after repeat echocardiogram  Elder Negus, MD [facility] [place] Pager: [phone] Office: [phone] If no answer Cell [phone]

## 2019-05-01 ENCOUNTER — Encounter (HOSPITAL_COMMUNITY): Payer: Self-pay

## 2019-05-01 ENCOUNTER — Encounter: Payer: Self-pay | Admitting: Cardiology

## 2019-05-01 ENCOUNTER — Ambulatory Visit (INDEPENDENT_AMBULATORY_CARE_PROVIDER_SITE_OTHER): Payer: Self-pay | Admitting: Cardiology

## 2019-05-01 ENCOUNTER — Encounter (HOSPITAL_COMMUNITY): Payer: Self-pay | Admitting: *Deleted

## 2019-05-01 ENCOUNTER — Other Ambulatory Visit: Payer: Self-pay

## 2019-05-01 VITALS — BP 100/62 | HR 53 | Temp 97.7°F | Ht 62.0 in | Wt 237.0 lb

## 2019-05-01 DIAGNOSIS — I422 Other hypertrophic cardiomyopathy: Secondary | ICD-10-CM

## 2019-05-01 DIAGNOSIS — R0789 Other chest pain: Secondary | ICD-10-CM

## 2019-05-01 DIAGNOSIS — Z8673 Personal history of transient ischemic attack (TIA), and cerebral infarction without residual deficits: Secondary | ICD-10-CM | POA: Insufficient documentation

## 2019-05-03 ENCOUNTER — Telehealth: Payer: Self-pay

## 2019-05-03 ENCOUNTER — Other Ambulatory Visit: Payer: Self-pay | Admitting: Family Medicine

## 2019-05-03 ENCOUNTER — Encounter (HOSPITAL_COMMUNITY): Payer: Self-pay | Admitting: Emergency Medicine

## 2019-05-03 ENCOUNTER — Other Ambulatory Visit: Payer: Self-pay

## 2019-05-03 ENCOUNTER — Emergency Department (HOSPITAL_COMMUNITY): Payer: Self-pay

## 2019-05-03 ENCOUNTER — Emergency Department (HOSPITAL_COMMUNITY)
Admission: EM | Admit: 2019-05-03 | Discharge: 2019-05-03 | Disposition: A | Discharge status: Home / Self Care | Payer: Self-pay | Attending: Emergency Medicine | Admitting: Emergency Medicine

## 2019-05-03 DIAGNOSIS — Z8673 Personal history of transient ischemic attack (TIA), and cerebral infarction without residual deficits: Secondary | ICD-10-CM | POA: Insufficient documentation

## 2019-05-03 DIAGNOSIS — R05 Cough: Secondary | ICD-10-CM | POA: Insufficient documentation

## 2019-05-03 DIAGNOSIS — I110 Hypertensive heart disease with heart failure: Secondary | ICD-10-CM | POA: Insufficient documentation

## 2019-05-03 DIAGNOSIS — R0789 Other chest pain: Secondary | ICD-10-CM | POA: Insufficient documentation

## 2019-05-03 DIAGNOSIS — Z79899 Other long term (current) drug therapy: Secondary | ICD-10-CM | POA: Insufficient documentation

## 2019-05-03 DIAGNOSIS — Z7982 Long term (current) use of aspirin: Secondary | ICD-10-CM | POA: Insufficient documentation

## 2019-05-03 DIAGNOSIS — I509 Heart failure, unspecified: Secondary | ICD-10-CM | POA: Insufficient documentation

## 2019-05-03 LAB — BASIC METABOLIC PANEL
Anion gap: 7 (ref 5–15)
BUN: 14 mg/dL (ref 6–20)
CO2: 24 mmol/L (ref 22–32)
Calcium: 8.7 mg/dL — ABNORMAL LOW (ref 8.9–10.3)
Chloride: 109 mmol/L (ref 98–111)
Creatinine, Ser: 0.81 mg/dL (ref 0.44–1.00)
GFR calc Af Amer: >60 mL/min (ref >60)
GFR calc non Af Amer: >60 mL/min (ref >60)
Glucose, Bld: 112 mg/dL — ABNORMAL HIGH (ref 70–99)
Potassium: 3.5 mmol/L (ref 3.5–5.1)
Sodium: 140 mmol/L (ref 135–145)

## 2019-05-03 LAB — CBC
HCT: 40 % (ref 36.0–46.0)
Hemoglobin: 12.8 g/dL (ref 12.0–15.0)
MCH: 27.2 pg (ref 26.0–34.0)
MCHC: 32 g/dL (ref 30.0–36.0)
MCV: 85.1 fL (ref 80.0–100.0)
Platelets: 252 10*3/uL (ref 150–400)
RBC: 4.7 MIL/uL (ref 3.87–5.11)
RDW: 13.4 % (ref 11.5–15.5)
WBC: 12.6 10*3/uL — ABNORMAL HIGH (ref 4.0–10.5)
nRBC: 0 % (ref 0.0–0.2)

## 2019-05-03 LAB — I-STAT BETA HCG BLOOD, ED (MC, WL, AP ONLY): I-stat hCG, quantitative: <5 m[IU]/mL (ref <5)

## 2019-05-03 LAB — TROPONIN I (HIGH SENSITIVITY)
Troponin I (High Sensitivity): 12 ng/L (ref <18)
Troponin I (High Sensitivity): 11 ng/L (ref <18)

## 2019-05-03 LAB — BRAIN NATRIURETIC PEPTIDE: B Natriuretic Peptide: 376.9 pg/mL — ABNORMAL HIGH (ref 0.0–100.0)

## 2019-05-03 MED ORDER — POTASSIUM CHLORIDE CRYS ER 20 MEQ PO TBCR
20.0000 meq | EXTENDED_RELEASE_TABLET | Freq: Once | ORAL | Status: AC
  Administered 2019-05-03: 11:00:00 20 meq via ORAL
  Filled 2019-05-03: qty 1

## 2019-05-03 MED ORDER — SODIUM CHLORIDE 0.9% FLUSH
3.0000 mL | Freq: Once | INTRAVENOUS | Status: AC
  Administered 2019-05-03: 3 mL via INTRAVENOUS

## 2019-05-03 MED ORDER — FUROSEMIDE 10 MG/ML IJ SOLN
40.0000 mg | Freq: Once | INTRAMUSCULAR | Status: AC
  Administered 2019-05-03: 11:00:00 40 mg via INTRAVENOUS
  Filled 2019-05-03: qty 4

## 2019-05-03 MED FILL — SPIRONOLACTONE 25 MG TABLET: 25 | 30 days supply | Qty: 30 | Fill #2

## 2019-05-03 MED FILL — DILT XR 180 MG CAPSULE: 180 | 30 days supply | Qty: 30 | Fill #0

## 2019-05-03 NOTE — ED Triage Notes (Signed)
Patient reports central chest pain this morning with SOB and occasional productive cough , emesis this morning with pinkish color. Denies fever or chills . History of CHF .

## 2019-05-03 NOTE — ED Notes (Signed)
Pt ambulated with sats maintaining at 98-100% and HR 90s. No evidence of dyspnea

## 2019-05-03 NOTE — Discharge Instructions (Signed)
Recommend calling your cardiologist as well as your primary doctor, ideally to be seen by at least 1 of them early next week on Monday or Tuesday for close recheck.  Please take your medication as prescribed.  Tomorrow recommend taking additional dose of the oral Lasix medicine they have been prescribed to use as needed.  Recommend returning to ER if you develop fever, chest pain, difficulty breathing, any further episodes of blood in cough.

## 2019-05-03 NOTE — ED Provider Notes (Signed)
[facility] Provider Note   CSN: [id number] Arrival date & time: 05/03/19  0354     History   Chief Complaint Chief Complaint  Patient presents with  . Chest Pain  . Shortness of Breath    HPI Lydia Thompson is a 43 y.o. female.  Presents emerge department today with chief complaint of episode shortness of breath this morning, had some associated central chest pain.  States also had spell of coughing and had slight pinkish color in cough.  States the episode of shortness of breath resolved spontaneously, no longer having any shortness of breath or chest discomfort.  No leg pain, leg swelling.  States she has history of heart failure, feels similar to prior episodes of heart failure exacerbations.  States she takes spironolactone daily, has Lasix as needed, has not taken any Lasix recently.  No fever.   Completed chart review, reviewed recent echo, cardiology notes, hypertrophic cardiomyopathy on Cardizem and metoprolol, in February had a CT coronary study that was negative for any coronary artery disease, no pulmonary embolism.    HPI  Past Medical History:  Diagnosis Date  . CHF (congestive heart failure) (HCC)   . Hypertension     Patient Active Problem List   Diagnosis Date Noted  . H/O TIA (transient ischemic attack) and stroke 05/01/2019  . Hypertrophic cardiomyopathy (HCC) 04/28/2019  . SOB (shortness of breath) 09/26/2017  . HTN (hypertension) 09/26/2017  . Atypical chest pain 09/26/2017    Past Surgical History:  Procedure Laterality Date  . ABDOMINAL HYSTERECTOMY       OB History   No obstetric history on file.      Home Medications    Prior to Admission medications   Medication Sig Start Date End Date Taking? Authorizing Provider  aspirin EC 81 MG tablet Take 81 mg by mouth daily.   Yes [provider]  Cholecalciferol (VITAMIN D3) 50 MCG (2000 UT) CHEW Chew 1 mcg by mouth daily.   Yes [provider]  diltiazem (CARDIZEM CD) 180 MG 24 hr capsule Take 1 capsule (180 mg total) by mouth daily. 09/26/18  Yes Mike Gipouglas, Andre, FNP  furosemide (LASIX) 20 MG tablet Take 1 tablet (20 mg total) by mouth daily as needed for fluid or edema. 01/17/18  Yes Massie MaroonHollis, Lachina M, FNP  metoprolol tartrate (LOPRESSOR) 25 MG tablet Take 1 tablet (25 mg total) by mouth 2 (two) times daily. 09/26/18  Yes Mike Gipouglas, Andre, FNP  spironolactone (ALDACTONE) 25 MG tablet Take 1 tablet (25 mg total) by mouth daily. 10/22/18  Yes Mike Gipouglas, Andre, FNP  DILT-XR 180 MG 24 hr capsule TAKE 1 CAPSULE BY MOUTH DAILY. 05/03/19   Mike Gipouglas, Andre, FNP    Family History Family History  Problem Relation Age of Onset  . Hypertension Mother   . Heart disease Paternal Uncle     Social History Social History   Tobacco Use  . Smoking status: Never Smoker  . Smokeless tobacco: Never Used  Substance Use Topics  . Alcohol use: Yes    Alcohol/week: 0.0 standard drinks    Comment: social  . Drug use: No     Allergies   Patient has no known allergies.   Review of Systems Review of Systems  Constitutional: Negative for chills and fever.  HENT: Negative for ear pain and sore throat.   Eyes: Negative for pain and visual disturbance.  Respiratory: Positive for cough and shortness of breath.   Cardiovascular: Positive for chest  pain. Negative for palpitations.  Gastrointestinal: Negative for abdominal pain and vomiting.  Genitourinary: Negative for dysuria and hematuria.  Musculoskeletal: Negative for arthralgias and back pain.  Skin: Negative for color change and rash.  Neurological: Negative for seizures and syncope.  All other systems reviewed and are negative.    Physical Exam Updated Vital Signs BP 113/64   Pulse (!) 56   Temp 98.6 F (37 C) (Oral)   Resp (!) 21   SpO2 98%   Physical Exam Vitals signs and nursing note reviewed.  Constitutional:      General: She is not in acute distress.    Appearance:  She is well-developed.  HENT:     Head: Normocephalic and atraumatic.  Eyes:     Conjunctiva/sclera: Conjunctivae normal.  Neck:     Musculoskeletal: Neck supple.  Cardiovascular:     Rate and Rhythm: Normal rate and regular rhythm.     Heart sounds: No murmur.  Pulmonary:     Effort: Pulmonary effort is normal. No respiratory distress.     Breath sounds: Normal breath sounds.  Abdominal:     Palpations: Abdomen is soft.     Tenderness: There is no abdominal tenderness.  Musculoskeletal:     Right lower leg: She exhibits no tenderness. No edema.     Left lower leg: She exhibits no tenderness. No edema.  Skin:    General: Skin is warm and dry.  Neurological:     Mental Status: She is alert.      ED Treatments / Results  Labs (all labs ordered are listed, but only abnormal results are displayed) Labs Reviewed  BASIC METABOLIC PANEL - Abnormal; Notable for the following components:      Result Value   Glucose, Bld 112 (*)    Calcium 8.7 (*)    All other components within normal limits  CBC - Abnormal; Notable for the following components:   WBC 12.6 (*)    All other components within normal limits  BRAIN NATRIURETIC PEPTIDE - Abnormal; Notable for the following components:   B Natriuretic Peptide 376.9 (*)    All other components within normal limits  I-STAT BETA HCG BLOOD, ED ([facility], [facility], [facility] ONLY)  TROPONIN I (HIGH SENSITIVITY)  TROPONIN I (HIGH SENSITIVITY)    EKG EKG Interpretation  Date/Time:  Friday May 03 2019 04:44:29 EDT Ventricular Rate:  74 PR Interval:  150 QRS Duration: 108 QT Interval:  380 QTC Calculation: 421 R Axis:   43 Text Interpretation:  Normal sinus rhythm Possible Left atrial enlargement Left ventricular hypertrophy with repolarization abnormality Abnormal ECG No significant change since last tracing Confirmed by Ripley Fraise [phone]) on 05/03/2019 4:53:10 AM   Radiology Dg Chest 2 View  Result Date: 05/03/2019 CLINICAL DATA:   Chest pain EXAM: CHEST - 2 VIEW COMPARISON:  09/26/2018 FINDINGS: Cardiomegaly with Awanda Mink lines and fissure thickening. There is also patchy bilateral airspace opacity. Chronic cardiomegaly. No visible effusion or pneumothorax. IMPRESSION: 1. CHF. 2. Patchy airspace disease is likely alveolar edema, but superimposed pneumonia is not excluded. Electronically Signed   By: Monte Fantasia M.D.   On: 05/03/2019 05:29    Procedures Procedures (including critical care time)  Medications Ordered in ED Medications  sodium chloride flush (NS) 0.9 % injection 3 mL (3 mLs Intravenous Given 05/03/19 1126)  furosemide (LASIX) injection 40 mg (40 mg Intravenous Given 05/03/19 1126)  potassium chloride SA (K-DUR) CR tablet 20 mEq (20 mEq Oral Given 05/03/19 1126)  Initial Impression / Assessment and Plan / ED Course  I have reviewed the triage vital signs and the nursing notes.  Pertinent labs & imaging results that were available during my care of the patient were reviewed by me and considered in my medical decision making (see chart for details).        43 year old lady who had brief episode of chest tightness with some associated shortness of breath.  Patient has history of HOCM,.  Heart failure related to this, followed by cardiology as outpatient closely.  Had one episode of pinkish sputum associated with her shortness of breath.  Here patient is well-appearing with no ongoing symptoms.  BNP was mildly elevated, her chest x-ray was concerning for some increased pulmonary congestion.  Suspect patient may have mild heart failure exacerbation.  No tachypnea, no hypoxia, no tachycardia, doubt pulmonary embolism.  EKG without acute ischemic changes, troponin x2 within normal limits, no delta troponin. Recent clean CT coronary study negative. On chart review, patient had very similar presentation to ER in February and improved with single dose Lasix and close outpatient follow-up.  Gave patient a dose of  Lasix here, recommended close recheck with PCP as well as cardiologist early next week.  Reviewed detail return precautions with patient, will discharge home.   Final Clinical Impressions(s) / ED Diagnoses   Final diagnoses:  Acute on chronic heart failure, unspecified heart failure type [facility])    ED Discharge Orders    None       Milagros Loll, MD 05/03/19 [phone]

## 2019-05-06 ENCOUNTER — Encounter: Payer: Self-pay | Admitting: Family Medicine

## 2019-05-06 ENCOUNTER — Other Ambulatory Visit: Payer: Self-pay

## 2019-05-06 ENCOUNTER — Ambulatory Visit (INDEPENDENT_AMBULATORY_CARE_PROVIDER_SITE_OTHER): Payer: Self-pay | Admitting: Family Medicine

## 2019-05-06 DIAGNOSIS — I10 Essential (primary) hypertension: Secondary | ICD-10-CM

## 2019-05-06 DIAGNOSIS — I5189 Other ill-defined heart diseases: Secondary | ICD-10-CM

## 2019-05-06 MED ORDER — DILTIAZEM HCL ER COATED BEADS 180 MG PO CP24: 180.0000 mg | ORAL_CAPSULE | Freq: Every day | ORAL | 2 refills | Status: DC

## 2019-05-06 MED ORDER — SPIRONOLACTONE 25 MG PO TABS: 25.0000 mg | ORAL_TABLET | Freq: Every day | ORAL | 2 refills | Status: DC

## 2019-05-06 MED ORDER — FUROSEMIDE 20 MG PO TABS: 20.0000 mg | ORAL_TABLET | Freq: Every day | ORAL | 2 refills | Status: DC | PRN

## 2019-05-06 MED ORDER — METOPROLOL TARTRATE 25 MG PO TABS: 25.0000 mg | ORAL_TABLET | Freq: Two times a day (BID) | ORAL | 2 refills | Status: DC

## 2019-05-06 MED FILL — FUROSEMIDE 20 MG TABS: 20 | 30 days supply | Qty: 30 | Fill #0

## 2019-05-06 MED FILL — ?METOPROLOL 25 MG TABLET: 25 | 30 days supply | Qty: 60 | Fill #0

## 2019-05-06 NOTE — Progress Notes (Signed)
[facility]   Progress Note: General Provider: Mike GipAndre Johniece Hornbaker, FNP  SUBJECTIVE:   Lydia Thompson is a 43 y.o. female who  has a past medical history of CHF (congestive heart failure) (HCC) and Hypertension.. Patient presents today for Follow-up (er follow up for fluid on lungs )  Patient seen in the ED on 05/03/2019 due to SOB. Found to have acute on chronic heart failure. Given a dose of Lasix and released to home. Chest xray suggestive of increased pulmonary congestion. Is followed by cardiology.  Was last seen by their office on 05/01/2019. Continued on metoprolol, diltiazem and spironolactone. Pateint reports that her pharmacy does not have refills of her medications. Has not taken her diuretics today.  Review of Systems  Constitutional: Negative.   HENT: Negative.   Eyes: Negative.   Respiratory: Negative.   Cardiovascular: Negative.   Gastrointestinal: Negative.   Genitourinary: Negative.   Musculoskeletal: Negative.   Skin: Negative.   Neurological: Negative.   Psychiatric/Behavioral: Negative.      OBJECTIVE: BP 127/76 (BP Location: Left Arm, Patient Position: Sitting, Cuff Size: Large)   Pulse 72   Temp 98.6 F (37 C) (Oral)   Resp 16   Ht 5\' 2"  (1.575 m)   Wt 241 lb (109.3 kg)   SpO2 98%   BMI 44.08 kg/m   Wt Readings from Last 3 Encounters:  05/06/19 241 lb (109.3 kg)  05/01/19 237 lb (107.5 kg)  04/08/19 240 lb (108.9 kg)     Physical Exam Vitals signs and nursing note reviewed.  Constitutional:      General: She is not in acute distress.    Appearance: Normal appearance.  HENT:     Head: Normocephalic and atraumatic.  Eyes:     Extraocular Movements: Extraocular movements intact.     Conjunctiva/sclera: Conjunctivae normal.     Pupils: Pupils are equal, round, and reactive to light.  Cardiovascular:     Rate and Rhythm: Normal rate and regular rhythm.     Heart sounds: Murmur present.  Pulmonary:   Effort: Pulmonary effort is normal.     Breath sounds: Normal breath sounds.  Musculoskeletal: Normal range of motion.  Skin:    General: Skin is warm and dry.  Neurological:     Mental Status: She is alert and oriented to person, place, and time.  Psychiatric:        Mood and Affect: Mood normal.        Behavior: Behavior normal.        Thought Content: Thought content normal.        Judgment: Judgment normal.     ASSESSMENT/PLAN:   1. Essential hypertension - furosemide (LASIX) 20 MG tablet; Take 1 tablet (20 mg total) by mouth daily as needed for fluid or edema.  Dispense: 90 tablet; Refill: 2  2. Diastolic dysfunction - furosemide (LASIX) 20 MG tablet; Take 1 tablet (20 mg total) by mouth daily as needed for fluid or edema.  Dispense: 90 tablet; Refill: 2 - spironolactone (ALDACTONE) 25 MG tablet; Take 1 tablet (25 mg total) by mouth daily.  Dispense: 90 tablet; Refill: 2 - metoprolol tartrate (LOPRESSOR) 25 MG tablet; Take 1 tablet (25 mg total) by mouth 2 (two) times daily.  Dispense: 180 tablet; Refill: 2 - diltiazem (CARDIZEM CD) 180 MG 24 hr capsule; Take 1 capsule (180 mg total) by mouth daily.  Dispense: 90 capsule; Refill: 2   Return in about 3 months (around  08/05/2019) for CHF. .    The patient was given clear instructions to go to ER or return to medical center if symptoms do not improve, worsen or new problems develop. The patient verbalized understanding and agreed with plan of care.   [name], FNP-BC Patient [facility] [address] [place], [place] [address] [phone]

## 2019-05-06 NOTE — Patient Instructions (Signed)
Health Maintenance, Female Adopting a healthy lifestyle and getting preventive care are important in promoting health and wellness. Ask your health care provider about:  The right schedule for you to have regular tests and exams.  Things you can do on your own to prevent diseases and keep yourself healthy. What should I know about diet, weight, and exercise? Eat a healthy diet   Eat a diet that includes plenty of vegetables, fruits, low-fat dairy products, and lean protein.  Do not eat a lot of foods that are high in solid fats, added sugars, or sodium. Maintain a healthy weight Body mass index (BMI) is used to identify weight problems. It estimates body fat based on height and weight. Your health care provider can help determine your BMI and help you achieve or maintain a healthy weight. Get regular exercise Get regular exercise. This is one of the most important things you can do for your health. Most adults should:  Exercise for at least 150 minutes each week. The exercise should increase your heart rate and make you sweat (moderate-intensity exercise).  Do strengthening exercises at least twice a week. This is in addition to the moderate-intensity exercise.  Spend less time sitting. Even light physical activity can be beneficial. Watch cholesterol and blood lipids Have your blood tested for lipids and cholesterol at 42 years of age, then have this test every 5 years. Have your cholesterol levels checked more often if:  Your lipid or cholesterol levels are high.  You are older than 42 years of age.  You are at high risk for heart disease. What should I know about cancer screening? Depending on your health history and family history, you may need to have cancer screening at various ages. This may include screening for:  Breast cancer.  Cervical cancer.  Colorectal cancer.  Skin cancer.  Lung cancer. What should I know about heart disease, diabetes, and high blood  pressure? Blood pressure and heart disease  High blood pressure causes heart disease and increases the risk of stroke. This is more likely to develop in people who have high blood pressure readings, are of African descent, or are overweight.  Have your blood pressure checked: ? Every 3-5 years if you are 18-39 years of age. ? Every year if you are 40 years old or older. Diabetes Have regular diabetes screenings. This checks your fasting blood sugar level. Have the screening done:  Once every three years after age 40 if you are at a normal weight and have a low risk for diabetes.  More often and at a younger age if you are overweight or have a high risk for diabetes. What should I know about preventing infection? Hepatitis B If you have a higher risk for hepatitis B, you should be screened for this virus. Talk with your health care provider to find out if you are at risk for hepatitis B infection. Hepatitis C Testing is recommended for:  Everyone born from 1945 through 1965.  Anyone with known risk factors for hepatitis C. Sexually transmitted infections (STIs)  Get screened for STIs, including gonorrhea and chlamydia, if: ? You are sexually active and are younger than 42 years of age. ? You are older than 42 years of age and your health care provider tells you that you are at risk for this type of infection. ? Your sexual activity has changed since you were last screened, and you are at increased risk for chlamydia or gonorrhea. Ask your health care provider if   you are at risk.  Ask your health care provider about whether you are at high risk for HIV. Your health care provider may recommend a prescription medicine to help prevent HIV infection. If you choose to take medicine to prevent HIV, you should first get tested for HIV. You should then be tested every 3 months for as long as you are taking the medicine. Pregnancy  If you are about to stop having your period (premenopausal) and  you may become pregnant, seek counseling before you get pregnant.  Take 400 to 800 micrograms (mcg) of folic acid every day if you become pregnant.  Ask for birth control (contraception) if you want to prevent pregnancy. Osteoporosis and menopause Osteoporosis is a disease in which the bones lose minerals and strength with aging. This can result in bone fractures. If you are 65 years old or older, or if you are at risk for osteoporosis and fractures, ask your health care provider if you should:  Be screened for bone loss.  Take a calcium or vitamin D supplement to lower your risk of fractures.  Be given hormone replacement therapy (HRT) to treat symptoms of menopause. Follow these instructions at home: Lifestyle  Do not use any products that contain nicotine or tobacco, such as cigarettes, e-cigarettes, and chewing tobacco. If you need help quitting, ask your health care provider.  Do not use street drugs.  Do not share needles.  Ask your health care provider for help if you need support or information about quitting drugs. Alcohol use  Do not drink alcohol if: ? Your health care provider tells you not to drink. ? You are pregnant, may be pregnant, or are planning to become pregnant.  If you drink alcohol: ? Limit how much you use to 0-1 drink a day. ? Limit intake if you are breastfeeding.  Be aware of how much alcohol is in your drink. In the [place] one drink equals one 12 oz bottle of beer (355 mL), one 5 oz glass of wine (148 mL), or one 1 oz glass of hard liquor (44 mL). General instructions  Schedule regular health, dental, and eye exams.  Stay current with your vaccines.  Tell your health care provider if: ? You often feel depressed. ? You have ever been abused or do not feel safe at home. Summary  Adopting a healthy lifestyle and getting preventive care are important in promoting health and wellness.  Follow your health care provider's instructions about healthy  diet, exercising, and getting tested or screened for diseases.  Follow your health care provider's instructions on monitoring your cholesterol and blood pressure. This information is not intended to replace advice given to you by your health care provider. Make sure you discuss any questions you have with your health care provider. Document Released: 02/21/2011 Document Revised: 08/01/2018 Document Reviewed: 08/01/2018 Elsevier Patient Education  2020 [employer]  

## 2019-05-09 ENCOUNTER — Telehealth: Payer: Self-pay

## 2019-05-09 DIAGNOSIS — I422 Other hypertrophic cardiomyopathy: Secondary | ICD-10-CM

## 2019-05-09 NOTE — Telephone Encounter (Signed)
Sent. Approval will likely depend on insurance.

## 2019-05-09 NOTE — Telephone Encounter (Signed)
Please send referral for cardiac rehab

## 2019-05-09 NOTE — Telephone Encounter (Signed)
3-4 quarts would be ideal.

## 2019-05-09 NOTE — Telephone Encounter (Signed)
Telephone encounter:  Reason for call: Pt wants to know how much her daily intake of water should be?   Usual provider: MP  Last office visit: 05/01/19  Next office visit: 05/30/19   Last hospitalization: 05/03/19   Current Outpatient Medications on File Prior to Visit  Medication Sig Dispense Refill  . aspirin EC 81 MG tablet Take 81 mg by mouth daily.    . Cholecalciferol (VITAMIN D3) 50 MCG (2000 UT) CHEW Chew 1 mcg by mouth daily.    Marland Kitchen DILT-XR 180 MG 24 hr capsule TAKE 1 CAPSULE BY MOUTH DAILY. 150 capsule 0  . diltiazem (CARDIZEM CD) 180 MG 24 hr capsule Take 1 capsule (180 mg total) by mouth daily. 90 capsule 2  . furosemide (LASIX) 20 MG tablet Take 1 tablet (20 mg total) by mouth daily as needed for fluid or edema. 90 tablet 2  . metoprolol tartrate (LOPRESSOR) 25 MG tablet Take 1 tablet (25 mg total) by mouth 2 (two) times daily. 180 tablet 2  . spironolactone (ALDACTONE) 25 MG tablet Take 1 tablet (25 mg total) by mouth daily. 90 tablet 2   No current facility-administered medications on file prior to visit.

## 2019-05-13 ENCOUNTER — Telehealth (HOSPITAL_COMMUNITY): Payer: Self-pay

## 2019-05-13 ENCOUNTER — Encounter (HOSPITAL_COMMUNITY): Payer: Self-pay

## 2019-05-13 NOTE — Telephone Encounter (Signed)
Attempted to contact pt in regards to VCR, LMTCB,  Mailed letter

## 2019-05-14 ENCOUNTER — Other Ambulatory Visit: Payer: Self-pay

## 2019-05-14 ENCOUNTER — Encounter (HOSPITAL_COMMUNITY)
Admission: RE | Admit: 2019-05-14 | Discharge: 2019-05-14 | Disposition: A | Discharge status: Home / Self Care | Payer: Self-pay | Source: Ambulatory Visit | Attending: Cardiology | Admitting: Cardiology

## 2019-05-14 NOTE — Telephone Encounter (Signed)
Pt is interested in participating in Virtual Cardiac and Pulmonary Rehab. Pt advised that Virtual Cardiac and Pulmonary Rehab is provided at no cost to the patient.  Checklist:  1. Pt has smart device  ie smartphone and/or ipad for downloading an app  Yes 2. Reliable internet/wifi service    Yes 3. Understands how to use their smartphone and navigate within an app.  Yes   Pt verbalized understanding and is in agreement.  

## 2019-05-14 NOTE — Telephone Encounter (Signed)

## 2019-05-30 ENCOUNTER — Encounter: Payer: Self-pay | Admitting: Cardiology

## 2019-05-30 ENCOUNTER — Other Ambulatory Visit: Payer: Self-pay

## 2019-05-30 ENCOUNTER — Ambulatory Visit: Payer: Self-pay | Admitting: Cardiology

## 2019-05-30 VITALS — BP 112/70 | HR 60 | Temp 97.8°F | Ht 62.0 in | Wt 238.0 lb

## 2019-05-30 DIAGNOSIS — I10 Essential (primary) hypertension: Secondary | ICD-10-CM

## 2019-05-30 DIAGNOSIS — I5189 Other ill-defined heart diseases: Secondary | ICD-10-CM

## 2019-05-30 DIAGNOSIS — I422 Other hypertrophic cardiomyopathy: Secondary | ICD-10-CM

## 2019-05-30 MED ORDER — SPIRONOLACTONE 25 MG PO TABS: 25.0000 mg | ORAL_TABLET | Freq: Every day | ORAL | 3 refills | Status: DC

## 2019-05-30 MED ORDER — FUROSEMIDE 20 MG PO TABS: 20.0000 mg | ORAL_TABLET | Freq: Every day | ORAL | 3 refills | Status: DC | PRN

## 2019-05-30 MED ORDER — DILTIAZEM HCL ER COATED BEADS 180 MG PO CP24: 180.0000 mg | ORAL_CAPSULE | Freq: Every day | ORAL | 3 refills | Status: DC

## 2019-05-30 MED ORDER — METOPROLOL TARTRATE 25 MG PO TABS: 25.0000 mg | ORAL_TABLET | Freq: Two times a day (BID) | ORAL | 3 refills | Status: DC

## 2019-05-30 MED FILL — FUROSEMIDE 20 MG TABS: 20 | 30 days supply | Qty: 30 | Fill #0

## 2019-05-30 MED FILL — SPIRONOLACTONE 25 MG TABLET: 25 | 30 days supply | Qty: 30 | Fill #0

## 2019-05-30 MED FILL — METOPROLOL TARTRATE 25 MG T: 25 | 30 days supply | Qty: 60 | Fill #0

## 2019-05-30 MED FILL — DILTIAZEM 24HR ER 180 MG CA: 180 | 30 days supply | Qty: 30 | Fill #0

## 2019-05-30 NOTE — Progress Notes (Signed)
Patient is here for follow up visit.  Subjective:   Lydia Thompson, female    DOB: 1976/07/15, 43 y.o.   MRN: [id number]   Chief Complaint  Patient presents with  . Cardiomyopathy  . Follow-up     HPI  43 year old African American female with hypertrophic cardiomyopathy, history of SVT, TIA, abnormal stress test, here for follow up.   Given her abnormal exercise treadmill stress test, I recommended coronary CT angiogram for anatomic evaluation.  This showed no coronary artery disease with normal coronaries.  Pulmonary artery was dilated at 3.6 cm suggestive of pulmonary hypertension.  Since her last visit with me in 04/2018, patient was in the hospital, she had mild vascular congestion on chest Xray and mild elevation of BNP at 376. Her symptoms improved with one dose of IV lasix. Troponin HS was normal.   Since then, her dyspnea symptoms have improved.  She denies any leg edema.  Her weight at home stays around 235-237 pounds.  Past Medical History:  Diagnosis Date  . CHF (congestive heart failure) (HCC)   . Hypertension      Past Surgical History:  Procedure Laterality Date  . ABDOMINAL HYSTERECTOMY       Social History   Socioeconomic History  . Marital status: Single    Spouse name: Not on file  . Number of children: 0  . Years of education: Not on file  . Highest education level: Not on file  Occupational History  . Not on file  Social Needs  . Financial resource strain: Not on file  . Food insecurity    Worry: Not on file    Inability: Not on file  . Transportation needs    Medical: Not on file    Non-medical: Not on file  Tobacco Use  . Smoking status: Never Smoker  . Smokeless tobacco: Never Used  Substance and Sexual Activity  . Alcohol use: Yes    Alcohol/week: 0.0 standard drinks    Comment: social  . Drug use: No  . Sexual activity: Not on file  Lifestyle  . Physical activity    Days per week: Not on file    Minutes per session: Not on  file  . Stress: Not on file  Relationships  . Social Musicianconnections    Talks on phone: Not on file    Gets together: Not on file    Attends religious service: Not on file    Active member of club or organization: Not on file    Attends meetings of clubs or organizations: Not on file    Relationship status: Not on file  . Intimate partner violence    Fear of current or ex partner: Not on file    Emotionally abused: Not on file    Physically abused: Not on file    Forced sexual activity: Not on file  Other Topics Concern  . Not on file  Social History Narrative  . Not on file     Current Outpatient Medications on File Prior to Visit  Medication Sig Dispense Refill  . aspirin EC 81 MG tablet Take 81 mg by mouth daily.    . Cholecalciferol (VITAMIN D3) 50 MCG (2000 UT) CHEW Chew 1 mcg by mouth daily.    Marland Kitchen. DILT-XR 180 MG 24 hr capsule TAKE 1 CAPSULE BY MOUTH DAILY. 150 capsule 0  . diltiazem (CARDIZEM CD) 180 MG 24 hr capsule Take 1 capsule (180 mg total) by mouth daily. 90 capsule 2  .  furosemide (LASIX) 20 MG tablet Take 1 tablet (20 mg total) by mouth daily as needed for fluid or edema. 90 tablet 2  . metoprolol tartrate (LOPRESSOR) 25 MG tablet Take 1 tablet (25 mg total) by mouth 2 (two) times daily. 180 tablet 2  . spironolactone (ALDACTONE) 25 MG tablet Take 1 tablet (25 mg total) by mouth daily. 90 tablet 2   No current facility-administered medications on file prior to visit.     Cardiovascular studies:  EKG 05/01/2019: Sinus bradycardia 50 bpm. Left atrial enlargement.   Echocardiogram 02/01/2019: Normal LV systolic function with EF 56%. Left ventricle cavity is normal in size. Severe asymmetric hypertrophy of the left ventricle. Resting LVOT max gradient 12 mmHg.  Normal global wall motion. Indeterminate diastolic filling pattern.  Left atrial cavity is severely dilated. Trileaflet aortic valve. No significant valvular stenosis. Trace aortic regurgitation. Moderate  (Grade III) mitral regurgitation. Mild tricuspid regurgitation. Estimated pulmonary artery systolic pressure 24 mmHg. No significant change compared to previous study on 01/07/2018.  Coronary CT angiogram 10/17/2018: 1. Coronary calcium score of 0. This was 0 percentile for age and sex matched control. 2. Normal coronary origin with right dominance. 3. No evidence of CAD. 4. Moderately dilated pulmonary artery measuring 36 mm suggestive of pulmonary hypertension.   Exercise Treadmill Stress Test 01/12/2018:  Indication: SoB  The patient exercised on Bruce protocol for 4:00 min. Patient achieved  5.84 METS and reached HR  154 bpm, which is   85% of maximum age-predicted HR.  Stress test terminated due to 7/10 CP and Fatigue.  Exercise capacity was below average for age.  HR Response to Exercise: Exaggerated HR response. BP Response to Exercise: Normal resting BP- appropriate response. Chest Pain: limiting. Arrhythmias: none. ST Changes:  Rest: Sinus rhythm 90 bpm. 1-2 mm T wave inversions leads II, III, aVF Stress: Sinus tachycardia. >2 mm T wave inversions leads II, III, aVF, V5,V 6. <1 mm ST elevation lead aVR. ST changes persist 2 min into recovery.   Overall Impression: Positive stress test typical of diffuse subendocardial ischemia. Consider further cardiac workup.  Echocardiogram 12/28/2017: Left ventricle cavity is normal in size. Severe concentric hypertrophy of the left ventricle. Interventricular septum measures 2.1 cm. Normal global wall motion. Normal diastolic filling pattern. LAP cannot be estimated due to severity of mitral regurgitation.  Calculated EF 69%. Left atrial cavity is severely dilated. 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 Increased velocities across LVOT and aortic valve likely due to LVH and mild SAM. No valvular stenosis. Mild (Grade I) aortic regurgitation.  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  Moderate (Grade III) mitral regurgitation. Mild to moderate tricuspid regurgitation.  No evidence of pulmonary hypertension. Consider hypertrophic cardiomyopathy. No significant change compared to hospital echocardiogram in 02/.2019  Review of Systems  Constitution: Negative for decreased appetite, malaise/fatigue, weight gain and weight loss.  HENT: Negative for congestion.   Eyes: Negative for visual disturbance.  Cardiovascular: Positive for chest pain. Negative for dyspnea on exertion, leg swelling, palpitations and syncope.  Respiratory: Positive for cough and hemoptysis. Negative for shortness of breath.   Endocrine: Negative for cold intolerance.  Hematologic/Lymphatic: Does not bruise/bleed easily.  Skin: Negative for itching and rash.  Musculoskeletal: Negative for myalgias.  Gastrointestinal: Negative for abdominal pain, nausea and vomiting.  Genitourinary: Negative for dysuria.  Neurological: Negative for dizziness and weakness.  Psychiatric/Behavioral: The patient is not nervous/anxious.   All other systems reviewed and are negative.  Objective:   Vitals:   05/30/19 1035  BP: 112/70  Pulse: 60  Temp: 97.8 F (36.6 C)  SpO2: 98%     Physical Exam  Constitutional: She is oriented to person, place, and time. She appears well-developed and well-nourished. No distress.  HENT:  Head: Normocephalic and atraumatic.  Eyes: Pupils are equal, round, and reactive to light. Conjunctivae are normal.  Neck: No JVD present.  Cardiovascular: Normal rate, regular rhythm and intact distal pulses.  Murmur (II/VI ESM RUSB) heard. Pulmonary/Chest: Effort normal and breath sounds normal. She has no wheezes. She has no rales.  Abdominal: Soft. Bowel sounds are normal. There is no rebound.  Musculoskeletal:        General: No edema.  Lymphadenopathy:    She has no cervical adenopathy.  Neurological: She is alert and oriented to person, place, and time. No cranial nerve  deficit.  Skin: Skin is warm and dry.  Psychiatric: She has a normal mood and affect.  Nursing note and vitals reviewed.       Assessment & Recommendations:    43 year old [place] American female with hypertrophic cardiomyopathy, history of SVT, TIA, atypical chest pain  1. Atypical chest pain Coronary CTA showed no CAD Likely noncardiac pain.  2. Hypertrophic cardiomyopathy (HCC) Septal thickness 2.1 cm. No indication for ICD. Continue metoprolol and diltiazem.  3. Chronic heart failure with preserved ejection fraction (HCC) Due to #2. Currently stable.  Tolerating low ose spironolactone. Take Lasix 20 mg daily.  Recommend daily weights. Discussed importance of low salt diet.   4. H/o TIA: Continue Aspirin 81 mg daily.  F/u in 8 weeks.   Elder Negus, MD [facility] Pager: [phone] Office: [phone] If no answer Cell [phone]

## 2019-06-25 ENCOUNTER — Telehealth (HOSPITAL_COMMUNITY): Payer: Self-pay

## 2019-06-25 ENCOUNTER — Encounter (HOSPITAL_COMMUNITY): Payer: Self-pay

## 2019-06-25 NOTE — Telephone Encounter (Signed)
Phone call to Pt to inquire about inactivity in Virtual CR app. Pt did not answer and message was left for Pt to return call.Will terminate Pt from Virtual CR app.

## 2019-06-25 NOTE — Progress Notes (Signed)
Lydia Thompson has not been active in the virtual app. Pt will be discharged from the app. Patient's virtual cardiac rehab report will be scanned to media for review.

## 2019-07-03 MED FILL — DILTIAZEM 24HR ER 180 MG CA: 180 | 30 days supply | Qty: 30 | Fill #1

## 2019-07-03 MED FILL — SPIRONOLACTONE 25 MG TABLET: 25 | 30 days supply | Qty: 30 | Fill #1

## 2019-07-03 MED FILL — ?METOPROLOL 25 MG TABLET: 25 | 30 days supply | Qty: 60 | Fill #1

## 2019-07-26 ENCOUNTER — Other Ambulatory Visit: Payer: Self-pay

## 2019-07-26 ENCOUNTER — Telehealth (INDEPENDENT_AMBULATORY_CARE_PROVIDER_SITE_OTHER): Payer: Self-pay | Admitting: Cardiology

## 2019-07-26 VITALS — BP 128/89 | HR 68 | Wt 236.0 lb

## 2019-07-26 DIAGNOSIS — I422 Other hypertrophic cardiomyopathy: Secondary | ICD-10-CM

## 2019-07-26 DIAGNOSIS — Z8673 Personal history of transient ischemic attack (TIA), and cerebral infarction without residual deficits: Secondary | ICD-10-CM

## 2019-07-26 DIAGNOSIS — I10 Essential (primary) hypertension: Secondary | ICD-10-CM

## 2019-07-26 DIAGNOSIS — I5189 Other ill-defined heart diseases: Secondary | ICD-10-CM

## 2019-07-26 MED ORDER — ASPIRIN EC 81 MG PO TBEC: 81.0000 mg | DELAYED_RELEASE_TABLET | Freq: Every day | ORAL | 3 refills | Status: DC

## 2019-07-26 MED ORDER — FUROSEMIDE 20 MG PO TABS: 20.0000 mg | ORAL_TABLET | Freq: Every day | ORAL | 3 refills | Status: DC | PRN

## 2019-07-26 MED ORDER — METOPROLOL TARTRATE 25 MG PO TABS: 25.0000 mg | ORAL_TABLET | Freq: Two times a day (BID) | ORAL | 3 refills | Status: DC

## 2019-07-26 MED ORDER — SPIRONOLACTONE 25 MG PO TABS: 25.0000 mg | ORAL_TABLET | Freq: Every day | ORAL | 3 refills | Status: DC

## 2019-07-26 MED ORDER — DILTIAZEM HCL ER COATED BEADS 180 MG PO CP24: 180.0000 mg | ORAL_CAPSULE | Freq: Every day | ORAL | 3 refills | Status: DC

## 2019-07-26 MED FILL — FUROSEMIDE 20 MG TABS: 20 | 30 days supply | Qty: 30 | Fill #0

## 2019-07-26 NOTE — Progress Notes (Signed)
Patient is here for follow up visit.  Subjective:   Lydia Thompson, female    DOB: 03/17/1976, 43 y.o.   MRN: [id number]   Chief Complaint  Patient presents with  . Cardiomyopathy     HPI  43 year old African American female with hypertrophic cardiomyopathy, history of SVT, TIA, abnormal stress test, here for follow up.   Patient is doing well, denies chest pain, shortness of breath, palpitations, leg edema, orthopnea, PND, TIA/syncope. She is trying to walk regularly. She take lasix 20 mg every other day.    Past Medical History:  Diagnosis Date  . CHF (congestive heart failure) (HCC)   . Hypertension      Past Surgical History:  Procedure Laterality Date  . ABDOMINAL HYSTERECTOMY       Social History   Socioeconomic History  . Marital status: Single    Spouse name: Not on file  . Number of children: 0  . Years of education: Not on file  . Highest education level: Not on file  Occupational History  . Not on file  Social Needs  . Financial resource strain: Not on file  . Food insecurity    Worry: Not on file    Inability: Not on file  . Transportation needs    Medical: Not on file    Non-medical: Not on file  Tobacco Use  . Smoking status: Never Smoker  . Smokeless tobacco: Never Used  Substance and Sexual Activity  . Alcohol use: Yes    Alcohol/week: 0.0 standard drinks    Comment: social  . Drug use: No  . Sexual activity: Not on file  Lifestyle  . Physical activity    Days per week: Not on file    Minutes per session: Not on file  . Stress: Not on file  Relationships  . Social Musician on phone: Not on file    Gets together: Not on file    Attends religious service: Not on file    Active member of club or organization: Not on file    Attends meetings of clubs or organizations: Not on file    Relationship status: Not on file  . Intimate partner violence    Fear of current or ex partner: Not on file    Emotionally abused: Not  on file    Physically abused: Not on file    Forced sexual activity: Not on file  Other Topics Concern  . Not on file  Social History Narrative  . Not on file     Current Outpatient Medications on File Prior to Visit  Medication Sig Dispense Refill  . aspirin EC 81 MG tablet Take 81 mg by mouth daily.    . Cholecalciferol (VITAMIN D3) 50 MCG (2000 UT) CHEW Chew 1 mcg by mouth daily.    Marland Kitchen diltiazem (CARDIZEM CD) 180 MG 24 hr capsule Take 1 capsule (180 mg total) by mouth daily. 90 capsule 3  . furosemide (LASIX) 20 MG tablet Take 1 tablet (20 mg total) by mouth daily as needed for fluid or edema. 90 tablet 3  . metoprolol tartrate (LOPRESSOR) 25 MG tablet Take 1 tablet (25 mg total) by mouth 2 (two) times daily. 180 tablet 3  . spironolactone (ALDACTONE) 25 MG tablet Take 1 tablet (25 mg total) by mouth daily. 90 tablet 3   No current facility-administered medications on file prior to visit.     Cardiovascular studies:  EKG 05/01/2019: Sinus bradycardia 50 bpm.  Left atrial enlargement.   Echocardiogram 02/01/2019: Normal LV systolic function with EF 56%. Left ventricle cavity is normal in size. Severe asymmetric hypertrophy of the left ventricle. Resting LVOT max gradient 12 mmHg.  Normal global wall motion. Indeterminate diastolic filling pattern.  Left atrial cavity is severely dilated. Trileaflet aortic valve. No significant valvular stenosis. Trace aortic regurgitation. Moderate (Grade III) mitral regurgitation. Mild tricuspid regurgitation. Estimated pulmonary artery systolic pressure 24 mmHg. No significant change compared to previous study on 01/07/2018.  Coronary CT angiogram 10/17/2018: 1. Coronary calcium score of 0. This was 0 percentile for age and sex matched control. 2. Normal coronary origin with right dominance. 3. No evidence of CAD. 4. Moderately dilated pulmonary artery measuring 36 mm suggestive of pulmonary hypertension.   Exercise Treadmill Stress Test  01/12/2018:  Indication: SoB  The patient exercised on Bruce protocol for 4:00 min. Patient achieved  5.84 METS and reached HR  154 bpm, which is   85% of maximum age-predicted HR.  Stress test terminated due to 7/10 CP and Fatigue.  Exercise capacity was below average for age.  HR Response to Exercise: Exaggerated HR response. BP Response to Exercise: Normal resting BP- appropriate response. Chest Pain: limiting. Arrhythmias: none. ST Changes:  Rest: Sinus rhythm 90 bpm. 1-2 mm T wave inversions leads II, III, aVF Stress: Sinus tachycardia. >2 mm T wave inversions leads II, III, aVF, V5,V 6. <1 mm ST elevation lead aVR. ST changes persist 2 min into recovery.   Overall Impression: Positive stress test typical of diffuse subendocardial ischemia. Consider further cardiac workup.  Echocardiogram 12/28/2017: Left ventricle cavity is normal in size. Severe concentric hypertrophy of the left ventricle. Interventricular septum measures 2.1 cm. Normal global wall motion. Normal diastolic filling pattern. LAP cannot be estimated due to severity of mitral regurgitation.  Calculated EF 69%. Left atrial cavity is severely dilated. 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 Increased velocities across LVOT and aortic valve likely due to LVH and mild SAM. No valvular stenosis. Mild (Grade I) aortic regurgitation.  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 Moderate (Grade III) mitral regurgitation. Mild to moderate tricuspid regurgitation.  No evidence of pulmonary hypertension. Consider hypertrophic cardiomyopathy. No significant change compared to hospital echocardiogram in 02/.2019  Review of Systems  Constitution: Negative for decreased appetite, malaise/fatigue, weight gain and weight loss.  HENT: Negative for congestion.   Eyes: Negative for visual disturbance.  Cardiovascular: Positive for chest pain. Negative for dyspnea on exertion, leg swelling,  palpitations and syncope.  Respiratory: Positive for cough and hemoptysis. Negative for shortness of breath.   Endocrine: Negative for cold intolerance.  Hematologic/Lymphatic: Does not bruise/bleed easily.  Skin: Negative for itching and rash.  Musculoskeletal: Negative for myalgias.  Gastrointestinal: Negative for abdominal pain, nausea and vomiting.  Genitourinary: Negative for dysuria.  Neurological: Negative for dizziness and weakness.  Psychiatric/Behavioral: The patient is not nervous/anxious.   All other systems reviewed and are negative.      Objective:   Vitals:   07/26/19 0822  BP: 128/89  Pulse: 68     Physical Exam   Not performed. Telephone visit.      Assessment & Recommendations:    43 year old African American female with hypertrophic cardiomyopathy, history of SVT, TIA, atypical chest pain  1. Atypical chest pain Coronary CTA showed no CAD Likely noncardiac pain.  2. Hypertrophic cardiomyopathy (HCC) Septal thickness 2.1 cm. No indication for ICD. Continue metoprolol and diltiazem.  3. Chronic heart failure with preserved ejection  fraction ([place]) Due to #2. Currently stable.  Tolerating low ose spironolactone. Take Lasix 20 mg as needed. Recommend daily weights. Discussed importance of low salt diet.   4. H/o TIA: Continue Aspirin 81 mg daily.  F/u in 3 months  Crystal [place], MD [facility] Pager: [phone] Office: [phone] If no answer Cell [phone]

## 2019-07-29 MED FILL — ?METOPROLOL 25 MG TABLET: 25 | 30 days supply | Qty: 60 | Fill #2

## 2019-07-29 MED FILL — DILTIAZEM 24HR ER 180 MG CA: 180 | 30 days supply | Qty: 30 | Fill #2

## 2019-07-29 MED FILL — SPIRONOLACTONE 25 MG TABLET: 25 | 30 days supply | Qty: 30 | Fill #2

## 2019-08-05 ENCOUNTER — Ambulatory Visit: Payer: Self-pay | Admitting: Family Medicine

## 2019-09-04 MED FILL — SPIRONOLACTONE 25 MG TABLET: 25 | 30 days supply | Qty: 30 | Fill #3

## 2019-09-04 MED FILL — FUROSEMIDE 20 MG TABS: 20 | 30 days supply | Qty: 30 | Fill #1

## 2019-09-04 MED FILL — ?METOPROLOL 25 MG TABLET: 25 | 30 days supply | Qty: 60 | Fill #3

## 2019-09-04 MED FILL — DILTIAZEM 24HR ER 180 MG CA: 180 | 30 days supply | Qty: 30 | Fill #3

## 2019-09-13 ENCOUNTER — Other Ambulatory Visit: Payer: Self-pay

## 2019-09-13 ENCOUNTER — Encounter: Payer: Self-pay | Admitting: Nurse Practitioner

## 2019-09-13 ENCOUNTER — Telehealth: Payer: Self-pay

## 2019-09-13 ENCOUNTER — Ambulatory Visit (INDEPENDENT_AMBULATORY_CARE_PROVIDER_SITE_OTHER): Payer: Self-pay | Admitting: Nurse Practitioner

## 2019-09-13 VITALS — BP 113/71 | HR 70 | Temp 98.4°F | Resp 16 | Ht 62.0 in | Wt 238.0 lb

## 2019-09-13 DIAGNOSIS — N952 Postmenopausal atrophic vaginitis: Secondary | ICD-10-CM

## 2019-09-13 DIAGNOSIS — Z6841 Body Mass Index (BMI) 40.0 and over, adult: Secondary | ICD-10-CM

## 2019-09-13 DIAGNOSIS — I10 Essential (primary) hypertension: Secondary | ICD-10-CM

## 2019-09-13 DIAGNOSIS — I422 Other hypertrophic cardiomyopathy: Secondary | ICD-10-CM

## 2019-09-13 MED ORDER — PREMARIN 0.625 MG/GM VA CREA: 1.0000 | TOPICAL_CREAM | Freq: Every day | VAGINAL | 12 refills | Status: DC

## 2019-09-13 MED ORDER — SAXENDA 18 MG/3ML ~~LOC~~ SOPN: PEN_INJECTOR | SUBCUTANEOUS | 0 refills | Status: DC

## 2019-09-13 MED FILL — PREMARIN VAGINAL CREAM-APPL: 0.625 | 30 days supply | Qty: 1 | Fill #0

## 2019-09-13 MED FILL — SAXENDA 18 MG/3 ML PEN: 18 | 27 days supply | Qty: 6 | Fill #0

## 2019-09-13 NOTE — Progress Notes (Signed)
Established Patient Office Visit  Subjective:  Patient ID: Lydia Thompson, female    DOB: July 03, 1976  Age: 44 y.o. MRN: [id number]  CC:  Chief Complaint  Patient presents with  . Hypertension    sees cardiology   . Obesity    wants to discuss weight loss options     HPI Lydia Thompson presents for follow up. She has a history CHF and HTN . She has history of TIA.  She is concerned with how she developed heart failure.  She states that she does not drink ETOH or smoke. She is trying to lose weight. She has been walking, exercising and trying to eat better. She is not getting any real results.  She is taking the medicine as directed. Her daily wt 232 -236. She does have swelling in both legs. She admits that she tried apps to help with exercise this has not been effective. She admits that she has used Phentermine in the past which was effective but she is not sure if this contributed to her heart disease. She has occasional chest pain but this comes and goes but it has improved from past. She does have periods of fatigue especially with exercise.  She denies any dizziness, lightheadedness, shortness of breath, nausea or vomiting.  She has several friends that are currently using Trulicity for diabetes and have had weight loss.   She is concern about vaginal dryness. She is status post partial hysterectomy. She has used OTC KY jelly. She has also used other lubricants and gels that are not effective. She is having painful intercourse. She would like something more. She denies a family history of breast cancer.    Past Medical History:  Diagnosis Date  . CHF (congestive heart failure) ([place])   . Hypertension     Past Surgical History:  Procedure Laterality Date  . ABDOMINAL HYSTERECTOMY      Family History  Problem Relation Age of Onset  . Hypertension Mother   . Heart disease Paternal Uncle     Social History   Socioeconomic History  . Marital status: Single    Spouse name: Not  on file  . Number of children: 0  . Years of education: Not on file  . Highest education level: Not on file  Occupational History  . Not on file  Tobacco Use  . Smoking status: Never Smoker  . Smokeless tobacco: Never Used  Substance and Sexual Activity  . Alcohol use: Yes    Alcohol/week: 0.0 standard drinks    Comment: social  . Drug use: No  . Sexual activity: Not on file  Other Topics Concern  . Not on file  Social History Narrative  . Not on file   Social Determinants of Health   Financial Resource Strain:   . Difficulty of Paying Living Expenses: Not on file  Food Insecurity:   . Worried About Charity fundraiser in the Last Year: Not on file  . Ran Out of Food in the Last Year: Not on file  Transportation Needs:   . Lack of Transportation (Medical): Not on file  . Lack of Transportation (Non-Medical): Not on file  Physical Activity:   . Days of Exercise per Week: Not on file  . Minutes of Exercise per Session: Not on file  Stress:   . Feeling of Stress : Not on file  Social Connections:   . Frequency of Communication with Friends and Family: Not on file  . Frequency of  Social Gatherings with Friends and Family: Not on file  . Attends Religious Services: Not on file  . Active Member of Clubs or Organizations: Not on file  . Attends Banker Meetings: Not on file  . Marital Status: Not on file  Intimate Partner Violence:   . Fear of Current or Ex-Partner: Not on file  . Emotionally Abused: Not on file  . Physically Abused: Not on file  . Sexually Abused: Not on file    Outpatient Medications Prior to Visit  Medication Sig Dispense Refill  . aspirin EC 81 MG tablet Take 1 tablet (81 mg total) by mouth daily. 90 tablet 3  . diltiazem (CARDIZEM CD) 180 MG 24 hr capsule Take 1 capsule (180 mg total) by mouth daily. 90 capsule 3  . furosemide (LASIX) 20 MG tablet Take 1 tablet (20 mg total) by mouth daily as needed for fluid or edema. 60 tablet 3  .  metoprolol tartrate (LOPRESSOR) 25 MG tablet Take 1 tablet (25 mg total) by mouth 2 (two) times daily. 180 tablet 3  . spironolactone (ALDACTONE) 25 MG tablet Take 1 tablet (25 mg total) by mouth daily. 90 tablet 3  . Cholecalciferol (VITAMIN D3) 50 MCG (2000 UT) CHEW Chew 1 mcg by mouth daily.     No facility-administered medications prior to visit.    No Known Allergies  ROS Review of Systems  Constitutional: Negative.   HENT: Negative.   Eyes: Negative.   Respiratory: Negative.   Cardiovascular: Negative.        Chest discomfort  Gastrointestinal: Negative.   Endocrine: Negative.   Genitourinary: Negative.   Musculoskeletal: Negative.   Skin: Negative.   Allergic/Immunologic: Negative.   Neurological: Negative.   Hematological: Negative.   Psychiatric/Behavioral: Negative.       Objective:    Physical Exam  Constitutional: She is oriented to person, place, and time. She appears well-developed and well-nourished.  HENT:  Head: Normocephalic.  Cardiovascular: Normal rate and regular rhythm.  Pulmonary/Chest: Effort normal and breath sounds normal.  Abdominal: Soft. Bowel sounds are normal.  Musculoskeletal:        General: Normal range of motion.     Cervical back: Normal range of motion and neck supple.  Neurological: She is alert and oriented to person, place, and time. She has normal reflexes.  Skin: Skin is warm and dry.  Psychiatric: She has a normal mood and affect. Her behavior is normal. Judgment and thought content normal.    BP 113/71 (BP Location: Left Arm, Patient Position: Sitting, Cuff Size: Large)   Pulse 70   Temp 98.4 F (36.9 C) (Oral)   Resp 16   Ht 5\' 2"  (1.575 m)   Wt 238 lb (108 kg)   SpO2 97%   BMI 43.53 kg/m  Wt Readings from Last 3 Encounters:  09/13/19 238 lb (108 kg)  07/26/19 236 lb (107 kg)  05/30/19 238 lb (108 kg)     There are no preventive care reminders to display for this patient.  There are no preventive care  reminders to display for this patient.  Lab Results  Component Value Date   TSH 1.100 10/18/2017   Lab Results  Component Value Date   WBC 12.6 (H) 05/03/2019   HGB 12.8 05/03/2019   HCT 40.0 05/03/2019   MCV 85.1 05/03/2019   PLT 252 05/03/2019   Lab Results  Component Value Date   NA 140 05/03/2019   K 3.5 05/03/2019   CO2 24  05/03/2019   GLUCOSE 112 (H) 05/03/2019   BUN 14 05/03/2019   CREATININE 0.81 05/03/2019   BILITOT 0.4 09/26/2018   ALKPHOS 63 09/26/2018   AST 12 09/26/2018   ALT 14 09/26/2018   PROT 6.9 09/26/2018   ALBUMIN 3.9 09/26/2018   CALCIUM 8.7 (L) 05/03/2019   ANIONGAP 7 05/03/2019   Lab Results  Component Value Date   CHOL 160 11/29/2017   Lab Results  Component Value Date   HDL 38 (L) 11/29/2017   Lab Results  Component Value Date   LDLCALC 87 11/29/2017   Lab Results  Component Value Date   TRIG 176 (H) 11/29/2017   Lab Results  Component Value Date   CHOLHDL 4.1 09/26/2017   Lab Results  Component Value Date   HGBA1C 5.0 09/26/2018      Assessment & Plan:   Problem List Items Addressed This Visit      High   HTN (hypertension) (Chronic)   Relevant Orders   Amb Referral to Cardiac Rehabilitation   Hypertrophic cardiomyopathy (HCC) - Primary   Relevant Orders   Amb Referral to Cardiac Rehabilitation    Other Visit Diagnoses    Atrophic vaginitis       Relevant Medications   conjugated estrogens (PREMARIN) vaginal cream   Class 3 severe obesity with body mass index (BMI) of 45.0 to 49.9 in adult, unspecified obesity type, unspecified whether serious comorbidity present (HCC)       Long discussion about weight patient wants to move forward with medication   Relevant Medications   Liraglutide -Weight Management (SAXENDA) 18 MG/3ML SOPN      Meds ordered this encounter  Medications  . conjugated estrogens (PREMARIN) vaginal cream    Sig: Place 1 Applicatorful vaginally daily.    Dispense:  42.5 g    Refill:  12     Order Specific Question:   Supervising Provider    Answer:   Quentin Angst [id number]  . Liraglutide -Weight Management (SAXENDA) 18 MG/3ML SOPN    Sig: Inject 0.6 mg into the skin daily for 7 days, THEN 1.2 mg daily for 7 days, THEN 1.8 mg daily.    Dispense:  6 pen    Refill:  0    Order Specific Question:   Supervising Provider    Answer:   Quentin Angst [[id number]]    Follow-up: Return in about 3 months (around 12/12/2019).    Barbette Merino, NP

## 2019-09-13 NOTE — Patient Instructions (Addendum)
DASH Eating Plan DASH stands for "Dietary Approaches to Stop Hypertension." The DASH eating plan is a healthy eating plan that has been shown to reduce high blood pressure (hypertension). It may also reduce your risk for type 2 diabetes, heart disease, and stroke. The DASH eating plan may also help with weight loss. What are tips for following this plan?  General guidelines  Avoid eating more than 2,300 mg (milligrams) of salt (sodium) a day. If you have hypertension, you may need to reduce your sodium intake to 1,500 mg a day.  Limit alcohol intake to no more than 1 drink a day for nonpregnant women and 2 drinks a day for men. One drink equals 12 oz of beer, 5 oz of wine, or 1 oz of hard liquor.  Work with your health care provider to maintain a healthy body weight or to lose weight. Ask what an ideal weight is for you.  Get at least 30 minutes of exercise that causes your heart to beat faster (aerobic exercise) most days of the week. Activities may include walking, swimming, or biking.  Work with your health care provider or diet and nutrition specialist (dietitian) to adjust your eating plan to your individual calorie needs. Reading food labels   Check food labels for the amount of sodium per serving. Choose foods with less than 5 percent of the Daily Value of sodium. Generally, foods with less than 300 mg of sodium per serving fit into this eating plan.  To find whole grains, look for the word "whole" as the first word in the ingredient list. Shopping  Buy products labeled as "low-sodium" or "no salt added."  Buy fresh foods. Avoid canned foods and premade or frozen meals. Cooking  Avoid adding salt when cooking. Use salt-free seasonings or herbs instead of table salt or sea salt. Check with your health care provider or pharmacist before using salt substitutes.  Do not fry foods. Cook foods using healthy methods such as baking, boiling, grilling, and broiling instead.  Cook with  heart-healthy oils, such as olive, canola, soybean, or sunflower oil. Meal planning  Eat a balanced diet that includes: ? 5 or more servings of fruits and vegetables each day. At each meal, try to fill half of your plate with fruits and vegetables. ? Up to 6-8 servings of whole grains each day. ? Less than 6 oz of lean meat, poultry, or fish each day. A 3-oz serving of meat is about the same size as a deck of cards. One egg equals 1 oz. ? 2 servings of low-fat dairy each day. ? A serving of nuts, seeds, or beans 5 times each week. ? Heart-healthy fats. Healthy fats called Omega-3 fatty acids are found in foods such as flaxseeds and coldwater fish, like sardines, salmon, and mackerel.  Limit how much you eat of the following: ? Canned or prepackaged foods. ? Food that is high in trans fat, such as fried foods. ? Food that is high in saturated fat, such as fatty meat. ? Sweets, desserts, sugary drinks, and other foods with added sugar. ? Full-fat dairy products.  Do not salt foods before eating.  Try to eat at least 2 vegetarian meals each week.  Eat more home-cooked food and less restaurant, buffet, and fast food.  When eating at a restaurant, ask that your food be prepared with less salt or no salt, if possible. What foods are recommended? The items listed may not be a complete list. Talk with your dietitian about   what dietary choices are best for you. Grains Whole-grain or whole-wheat bread. Whole-grain or whole-wheat pasta. Brown rice. Oatmeal. Quinoa. Bulgur. Whole-grain and low-sodium cereals. Pita bread. Low-fat, low-sodium crackers. Whole-wheat flour tortillas. Vegetables Fresh or frozen vegetables (raw, steamed, roasted, or grilled). Low-sodium or reduced-sodium tomato and vegetable juice. Low-sodium or reduced-sodium tomato sauce and tomato paste. Low-sodium or reduced-sodium canned vegetables. Fruits All fresh, dried, or frozen fruit. Canned fruit in natural juice (without  added sugar). Meat and other protein foods Skinless chicken or turkey. Ground chicken or turkey. Pork with fat trimmed off. Fish and seafood. Egg whites. Dried beans, peas, or lentils. Unsalted nuts, nut butters, and seeds. Unsalted canned beans. Lean cuts of beef with fat trimmed off. Low-sodium, lean deli meat. Dairy Low-fat (1%) or fat-free (skim) milk. Fat-free, low-fat, or reduced-fat cheeses. Nonfat, low-sodium ricotta or cottage cheese. Low-fat or nonfat yogurt. Low-fat, low-sodium cheese. Fats and oils Soft margarine without trans fats. Vegetable oil. Low-fat, reduced-fat, or light mayonnaise and salad dressings (reduced-sodium). Canola, safflower, olive, soybean, and sunflower oils. Avocado. Seasoning and other foods Herbs. Spices. Seasoning mixes without salt. Unsalted popcorn and pretzels. Fat-free sweets. What foods are not recommended? The items listed may not be a complete list. Talk with your dietitian about what dietary choices are best for you. Grains Baked goods made with fat, such as croissants, muffins, or some breads. Dry pasta or rice meal packs. Vegetables Creamed or fried vegetables. Vegetables in a cheese sauce. Regular canned vegetables (not low-sodium or reduced-sodium). Regular canned tomato sauce and paste (not low-sodium or reduced-sodium). Regular tomato and vegetable juice (not low-sodium or reduced-sodium). Pickles. Olives. Fruits Canned fruit in a light or heavy syrup. Fried fruit. Fruit in cream or butter sauce. Meat and other protein foods Fatty cuts of meat. Ribs. Fried meat. Bacon. Sausage. Bologna and other processed lunch meats. Salami. Fatback. Hotdogs. Bratwurst. Salted nuts and seeds. Canned beans with added salt. Canned or smoked fish. Whole eggs or egg yolks. Chicken or turkey with skin. Dairy Whole or 2% milk, cream, and half-and-half. Whole or full-fat cream cheese. Whole-fat or sweetened yogurt. Full-fat cheese. Nondairy creamers. Whipped toppings.  Processed cheese and cheese spreads. Fats and oils Butter. Stick margarine. Lard. Shortening. Ghee. Bacon fat. Tropical oils, such as coconut, palm kernel, or palm oil. Seasoning and other foods Salted popcorn and pretzels. Onion salt, garlic salt, seasoned salt, table salt, and sea salt. Worcestershire sauce. Tartar sauce. Barbecue sauce. Teriyaki sauce. Soy sauce, including reduced-sodium. Steak sauce. Canned and packaged gravies. Fish sauce. Oyster sauce. Cocktail sauce. Horseradish that you find on the shelf. Ketchup. Mustard. Meat flavorings and tenderizers. Bouillon cubes. Hot sauce and Tabasco sauce. Premade or packaged marinades. Premade or packaged taco seasonings. Relishes. Regular salad dressings. Where to find more information:  National Heart, Lung, and Blood Institute: www.nhlbi.nih.gov  American Heart Association: www.heart.org Summary  The DASH eating plan is a healthy eating plan that has been shown to reduce high blood pressure (hypertension). It may also reduce your risk for type 2 diabetes, heart disease, and stroke.  With the DASH eating plan, you should limit salt (sodium) intake to 2,300 mg a day. If you have hypertension, you may need to reduce your sodium intake to 1,500 mg a day.  When on the DASH eating plan, aim to eat more fresh fruits and vegetables, whole grains, lean proteins, low-fat dairy, and heart-healthy fats.  Work with your health care provider or diet and nutrition specialist (dietitian) to adjust your eating plan to your   individual calorie needs. This information is not intended to replace advice given to you by your health care provider. Make sure you discuss any questions you have with your health care provider. Document Revised: 07/21/2017 Document Reviewed: 08/01/2016 Elsevier Patient Education  2020 [employer] Liraglutide injection (Weight Management) What is this medicine? LIRAGLUTIDE (LIR a GLOO tide) is used to help people lose weight and  maintain weight loss. It is used with a reduced-calorie diet and exercise. This medicine may be used for other purposes; ask your health care provider or pharmacist if you have questions. COMMON BRAND NAME(S): Saxenda What should I tell my health care provider before I take this medicine? They need to know if you have any of these conditions:  endocrine tumors (MEN 2) or if someone in your family had these tumors  gallbladder disease  high cholesterol  history of alcohol abuse problem  history of pancreatitis  kidney disease or if you are on dialysis  liver disease  previous swelling of the tongue, face, or lips with difficulty breathing, difficulty swallowing, hoarseness, or tightening of the throat  stomach problems  suicidal thoughts, plans, or attempt; a previous suicide attempt by you or a family member  thyroid cancer or if someone in your family had thyroid cancer  an unusual or allergic reaction to liraglutide, other medicines, foods, dyes, or preservatives  pregnant or trying to get pregnant  breast-feeding How should I use this medicine? This medicine is for injection under the skin of your upper leg, stomach area, or upper arm. You will be taught how to prepare and give this medicine. Use exactly as directed. Take your medicine at regular intervals. Do not take it more often than directed. This drug comes with INSTRUCTIONS FOR USE. Ask your pharmacist for directions on how to use this drug. Read the information carefully. Talk to your pharmacist or health care provider if you have questions. It is important that you put your used needles and syringes in a special sharps container. Do not put them in a trash can. If you do not have a sharps container, call your pharmacist or healthcare provider to get one. A special MedGuide will be given to you by the pharmacist with each prescription and refill. Be sure to read this information carefully each time. Talk to your  pediatrician regarding the use of this medicine in children. Special care may be needed. Overdosage: If you think you have taken too much of this medicine contact a poison control center or emergency room at once. NOTE: This medicine is only for you. Do not share this medicine with others. What if I miss a dose? If you miss a dose, take it as soon as you can. If it is almost time for your next dose, take only that dose. Do not take double or extra doses. If you miss your dose for 3 days or more, call your doctor or health care professional to talk about how to restart this medicine. What may interact with this medicine?  insulin and other medicines for diabetes This list may not describe all possible interactions. Give your health care provider a list of all the medicines, herbs, non-prescription drugs, or dietary supplements you use. Also tell them if you smoke, drink alcohol, or use illegal drugs. Some items may interact with your medicine. What should I watch for while using this medicine? Visit your doctor or health care professional for regular checks on your progress. Drink plenty of fluids while taking this medicine.  Check with your doctor or health care professional if you get an attack of severe diarrhea, nausea, and vomiting. The loss of too much body fluid can make it dangerous for you to take this medicine. This medicine may affect blood sugar levels. Ask your healthcare provider if changes in diet or medicines are needed if you have diabetes. Patients and their families should watch out for worsening depression or thoughts of suicide. Also watch out for sudden changes in feelings such as feeling anxious, agitated, panicky, irritable, hostile, aggressive, impulsive, severely restless, overly excited and hyperactive, or not being able to sleep. If this happens, especially at the beginning of treatment or after a change in dose, call your health care professional. Women should inform their  health care provider if they wish to become pregnant or think they might be pregnant. Losing weight while pregnant is not advised and may cause harm to the unborn child. Talk to your health care provider for more information. What side effects may I notice from receiving this medicine? Side effects that you should report to your doctor or health care professional as soon as possible:  allergic reactions like skin rash, itching or hives, swelling of the face, lips, or tongue  breathing problems  diarrhea that continues or is severe  lump or swelling on the neck  severe nausea  signs and symptoms of infection like fever or chills; cough; sore throat; pain or trouble passing urine  signs and symptoms of low blood sugar such as feeling anxious; confusion; dizziness; increased hunger; unusually weak or tired; increased sweating; shakiness; cold, clammy skin; irritable; headache; blurred vision; fast heartbeat; loss of consciousness  signs and symptoms of kidney injury like trouble passing urine or change in the amount of urine  trouble swallowing  unusual stomach upset or pain  vomiting Side effects that usually do not require medical attention (report to your doctor or health care professional if they continue or are bothersome):  constipation  decreased appetite  diarrhea  fatigue  headache  nausea  pain, redness, or irritation at site where injected  stomach upset  stuffy or runny nose This list may not describe all possible side effects. Call your doctor for medical advice about side effects. You may report side effects to FDA at 1-800-FDA-1088. Where should I keep my medicine? Keep out of the reach of children. Store unopened pen in a refrigerator between 2 and 8 degrees C (36 and 46 degrees F). Do not freeze or use if the medicine has been frozen. Protect from light and excessive heat. After you first use the pen, it can be stored at room temperature between 15 and 30  degrees C (59 and 86 degrees F) or in a refrigerator. Throw away your used pen after 30 days or after the expiration date, whichever comes first. Do not store your pen with the needle attached. If the needle is left on, medicine may leak from the pen. NOTE: This sheet is a summary. It may not cover all possible information. If you have questions about this medicine, talk to your doctor, pharmacist, or health care provider.  2020 Elsevier/Gold Standard (2019-06-13 21:16:59) Dulaglutide injection What is this medicine? DULAGLUTIDE (DOO la GLOO tide) is used to improve blood sugar control in adults with type 2 diabetes. This medicine may be used with other oral diabetes medicines. This drug may also reduce the risk of heart attack or stroke if you have type 2 diabetes and risk factors for heart disease. This medicine  may be used for other purposes; ask your health care provider or pharmacist if you have questions. COMMON BRAND NAME(S): Trulicity What should I tell my health care provider before I take this medicine? They need to know if you have any of these conditions:  endocrine tumors (MEN 2) or if someone in your family had these tumors  eye disease, vision problems  history of pancreatitis  kidney disease  liver disease  stomach problems  thyroid cancer or if someone in your family had thyroid cancer  an unusual or allergic reaction to dulaglutide, other medicines, foods, dyes, or preservatives  pregnant or trying to get pregnant  breast-feeding How should I use this medicine? This medicine is for injection under the skin of your upper leg (thigh), stomach area, or upper arm. It is usually given once every week (every 7 days). You will be taught how to prepare and give this medicine. Use exactly as directed. Take your medicine at regular intervals. Do not take it more often than directed. If you use this medicine with insulin, you should inject this medicine and the insulin  separately. Do not mix them together. Do not give the injections right next to each other. Change (rotate) injection sites with each injection. It is important that you put your used needles and syringes in a special sharps container. Do not put them in a trash can. If you do not have a sharps container, call your pharmacist or healthcare provider to get one. A special MedGuide will be given to you by the pharmacist with each prescription and refill. Be sure to read this information carefully each time. This drug comes with INSTRUCTIONS FOR USE. Ask your pharmacist for directions on how to use this drug. Read the information carefully. Talk to your pharmacist or health care provider if you have questions. Talk to your pediatrician regarding the use of this medicine in children. Special care may be needed. Overdosage: If you think you have taken too much of this medicine contact a poison control center or emergency room at once. NOTE: This medicine is only for you. Do not share this medicine with others. What if I miss a dose? If you miss a dose, take it as soon as you can within 3 days after the missed dose. Then take your next dose at your regular weekly time. If it has been longer than 3 days after the missed dose, do not take the missed dose. Take the next dose at your regular time. Do not take double or extra doses. If you have questions about a missed dose, contact your health care provider for advice. What may interact with this medicine?  other medicines for diabetes Many medications may cause changes in blood sugar, these include:  alcohol containing beverages  antiviral medicines for HIV or AIDS  aspirin and aspirin-like drugs  certain medicines for blood pressure, heart disease, irregular heart beat  chromium  diuretics  female hormones, such as estrogens or progestins, birth control pills  fenofibrate  gemfibrozil  isoniazid  lanreotide  female hormones or anabolic  steroids  MAOIs like Carbex, Eldepryl, Marplan, Nardil, and Parnate  medicines for weight loss  medicines for allergies, asthma, cold, or cough  medicines for depression, anxiety, or psychotic disturbances  niacin  nicotine  NSAIDs, medicines for pain and inflammation, like ibuprofen or naproxen  octreotide  pasireotide  pentamidine  phenytoin  probenecid  quinolone antibiotics such as ciprofloxacin, levofloxacin, ofloxacin  some herbal dietary supplements  steroid medicines such  as prednisone or cortisone  sulfamethoxazole; trimethoprim  thyroid hormones Some medications can hide the warning symptoms of low blood sugar (hypoglycemia). You may need to monitor your blood sugar more closely if you are taking one of these medications. These include:  beta-blockers, often used for high blood pressure or heart problems (examples include atenolol, metoprolol, propranolol)  clonidine  guanethidine  reserpine This list may not describe all possible interactions. Give your health care provider a list of all the medicines, herbs, non-prescription drugs, or dietary supplements you use. Also tell them if you smoke, drink alcohol, or use illegal drugs. Some items may interact with your medicine. What should I watch for while using this medicine? Visit your doctor or health care professional for regular checks on your progress. Drink plenty of fluids while taking this medicine. Check with your doctor or health care professional if you get an attack of severe diarrhea, nausea, and vomiting. The loss of too much body fluid can make it dangerous for you to take this medicine. A test called the HbA1C (A1C) will be monitored. This is a simple blood test. It measures your blood sugar control over the last 2 to 3 months. You will receive this test every 3 to 6 months. Learn how to check your blood sugar. Learn the symptoms of low and high blood sugar and how to manage them. Always carry  a quick-source of sugar with you in case you have symptoms of low blood sugar. Examples include hard sugar candy or glucose tablets. Make sure others know that you can choke if you eat or drink when you develop serious symptoms of low blood sugar, such as seizures or unconsciousness. They must get medical help at once. Tell your doctor or health care professional if you have high blood sugar. You might need to change the dose of your medicine. If you are sick or exercising more than usual, you might need to change the dose of your medicine. Do not skip meals. Ask your doctor or health care professional if you should avoid alcohol. Many nonprescription cough and cold products contain sugar or alcohol. These can affect blood sugar. Pens should never be shared. Even if the needle is changed, sharing may result in passing of viruses like hepatitis or HIV. Wear a medical ID bracelet or chain, and carry a card that describes your disease and details of your medicine and dosage times. What side effects may I notice from receiving this medicine? Side effects that you should report to your doctor or health care professional as soon as possible:  allergic reactions like skin rash, itching or hives, swelling of the face, lips, or tongue  breathing problems  changes in vision  diarrhea that continues or is severe  lump or swelling on the neck  severe nausea  signs and symptoms of infection like fever or chills; cough; sore throat; pain or trouble passing urine  signs and symptoms of low blood sugar such as feeling anxious, confusion, dizziness, increased hunger, unusually weak or tired, sweating, shakiness, cold, irritable, headache, blurred vision, fast heartbeat, loss of consciousness  signs and symptoms of kidney injury like trouble passing urine or change in the amount of urine  trouble swallowing  unusual stomach upset or pain  vomiting Side effects that usually do not require medical  attention (report to your doctor or health care professional if they continue or are bothersome):  diarrhea  loss of appetite  nausea  pain, redness, or irritation at site where injected  stomach upset This list may not describe all possible side effects. Call your doctor for medical advice about side effects. You may report side effects to FDA at 1-800-FDA-1088. Where should I keep my medicine? Keep out of the reach of children. Store unopened pens in a refrigerator between 2 and 8 degrees C (36 and 46 degrees F). Do not freeze or use if the medicine has been frozen. Protect from light and excessive heat. Store in the carton until use. Each single-dose pen can be kept at room temperature, not to exceed 30 degrees C (86 degrees F) for a total of 14 days, if needed. Throw away any unused medicine after the expiration date on the label. NOTE: This sheet is a summary. It may not cover all possible information. If you have questions about this medicine, talk to your doctor, pharmacist, or health care provider.  2020 Elsevier/Gold Standard (2019-04-23 09:34:53)  Atrophic Vaginitis Atrophic vaginitis is a condition in which the tissues that line the vagina become dry and thin. This condition occurs in women who have stopped having their period. It is caused by a drop in a female hormone (estrogen). This hormone helps:  To keep the vagina moist.  To make a clear fluid. This clear fluid helps: ? To make the vagina ready for sex. ? To protect the vagina from infection. If the lining of the vagina is dry and thin, it may cause irritation, burning, or itchiness. It may also:  Make sex painful.  Make an exam of your vagina painful.  Cause bleeding.  Make you lose interest in sex.  Cause a burning feeling when you pee (urinate).  Cause a brown or yellow fluid to come from your vagina. Some women do not have symptoms. Follow these instructions at home: Medicines  Take over-the-counter  and prescription medicines only as told by your doctor.  Do not use herbs or other medicines unless your doctor says it is okay.  Use medicines for for dryness. These include: ? Oils to make the vagina soft. ? Creams. ? Moisturizers. General instructions  Do not douche.  Do not use products that can make your vagina dry. These include: ? Scented sprays. ? Scented tampons. ? Scented soaps.  Sex can help increase blood flow and soften the tissue in the vagina. If it hurts to have sex: ? Tell your partner. ? Use products to make sex more comfortable. Use these only as told by your doctor. Contact a doctor if you:  Have discharge from the vagina that is different than usual.  Have a bad smell coming from your vagina.  Have new symptoms.  Do not get better.  Get worse. Summary  Atrophic vaginitis is a condition in which the lining of the vagina becomes dry and thin.  This condition affects women who have stopped having their periods.  Treatment may include using products that help make the vagina soft.  Call a doctor if do not get better with treatment. This information is not intended to replace advice given to you by your health care provider. Make sure you discuss any questions you have with your health care provider. Document Revised: 08/21/2017 Document Reviewed: 08/21/2017 Elsevier Patient Education  2020 [employer].

## 2019-09-13 NOTE — Telephone Encounter (Deleted)
This pt came in this evening to fill 2 meds that we only dispense thru ins or patient assistance.  Saxenda and Pramarin Vaginal Cream.  The prescriber is an outside doctor.  We would only be able to do patient assistance for this pt if she is being seen at your clinic or one of ours.  I see that her last appt was in 04/2019 and then she had a couple canceled appt.  Is she and active pt at your office?  If not, I will direct her to the prescribing physician to obtain these meds.

## 2019-09-16 ENCOUNTER — Other Ambulatory Visit: Payer: Self-pay | Admitting: Nurse Practitioner

## 2019-09-16 ENCOUNTER — Telehealth: Payer: Self-pay | Admitting: Nurse Practitioner

## 2019-09-16 MED ORDER — ESTRADIOL 0.1 MG/GM VA CREA: 1.0000 g | TOPICAL_CREAM | Freq: Every day | VAGINAL | 1 refills | Status: AC

## 2019-09-16 MED FILL — ESTRADIOL 0.1 MG/GM CRM: 0.1 | 1 days supply | Qty: 43 | Fill #0

## 2019-09-16 NOTE — Telephone Encounter (Signed)
Patient called and says the Saxsenda injection is over $400. Is there anything that would be cheaper you could prescribe? Please advise.

## 2019-09-18 NOTE — Telephone Encounter (Signed)
She doesn't have any on file. Most insurances will not cover weight loss medications.

## 2019-09-18 NOTE — Telephone Encounter (Signed)
Spoke with patient, she would like a referral to the weight loss clinic. Can you please place referral? Thank you!

## 2019-09-19 ENCOUNTER — Other Ambulatory Visit: Payer: Self-pay

## 2019-09-19 MED ORDER — SAXENDA 18 MG/3ML ~~LOC~~ SOPN: PEN_INJECTOR | SUBCUTANEOUS | 0 refills | Status: DC

## 2019-09-26 NOTE — Telephone Encounter (Signed)
Crystal Brooke Dare is now this patient's PCP. Thanks.

## 2019-09-27 MED FILL — DILTIAZEM 24HR ER 180 MG CA: 180 | 90 days supply | Qty: 90 | Fill #4

## 2019-09-27 MED FILL — METOPROLOL TARTRATE 25 MG T: 25 | 90 days supply | Qty: 180 | Fill #0

## 2019-09-27 MED FILL — SPIRONOLACTONE 25 MG TABLET: 25 | 90 days supply | Qty: 90 | Fill #4

## 2019-10-09 ENCOUNTER — Other Ambulatory Visit: Payer: Self-pay | Admitting: Nurse Practitioner

## 2019-10-09 MED ORDER — CONTRAVE 8-90 MG PO TB12: ORAL_TABLET | ORAL | 0 refills | Status: DC

## 2019-10-27 IMAGING — CT CT ANGIO CHEST
2 of 6 series · 17 of 46 positions shown · IV contrast (ISOVUE)
Comparison: September 25, 2017 chest radiograph

CLINICAL DATA: Shortness of breath and cardiac arrhythmia

EXAM:
CT ANGIOGRAPHY CHEST WITH CONTRAST
TECHNIQUE: Multidetector CT imaging of the chest was performed using the
standard protocol during bolus administration of intravenous
contrast. Multiplanar CT image reconstructions and MIPs were
obtained to evaluate the vascular anatomy.
CONTRAST:  100mL 0731TA-XLL IOPAMIDOL (0731TA-XLL) INJECTION 61%

[Series 5: thins · axial · 0.64mm/px · z∈[-362,-123]mm · 14 of 263 slices shown]
[im 12/263  lung]
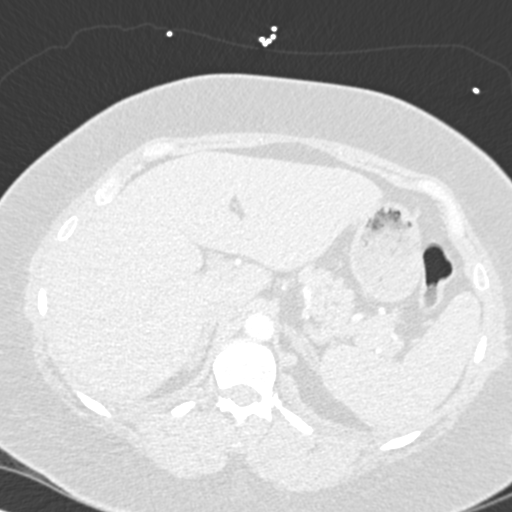
[im 35/263  soft-tissue]
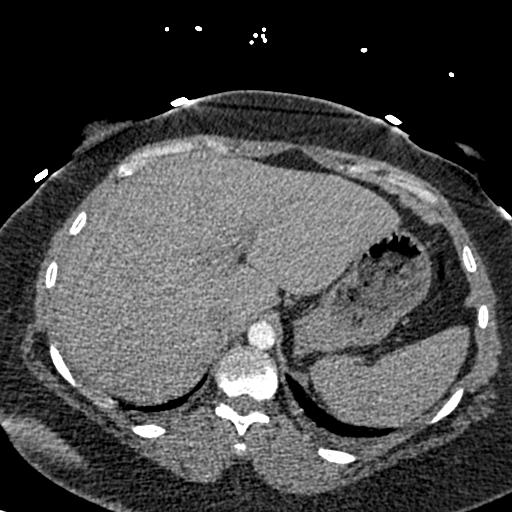
[im 46/263  lung]
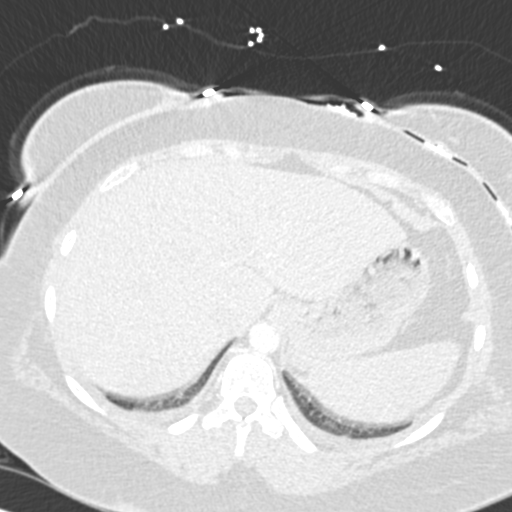
[im 69/263  soft-tissue]
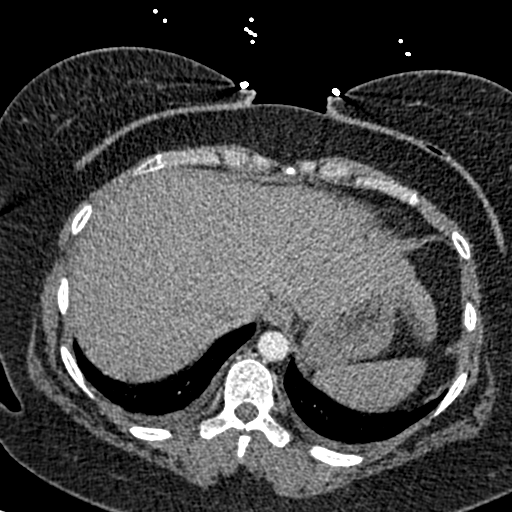
[im 92/263  lung]
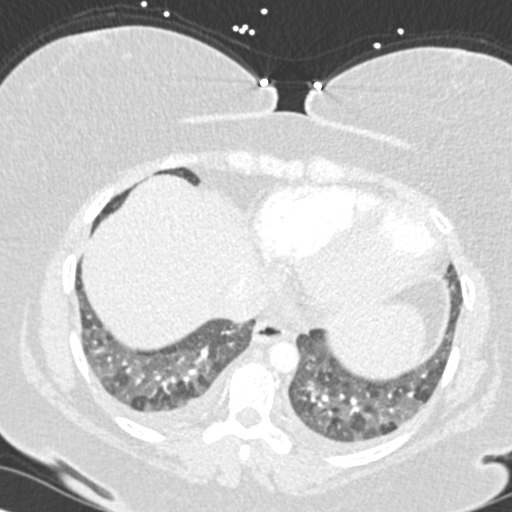
[im 103/263  soft-tissue]
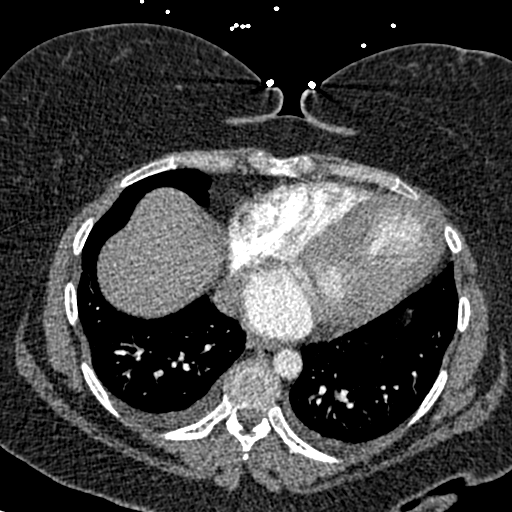
[im 126/263  lung]
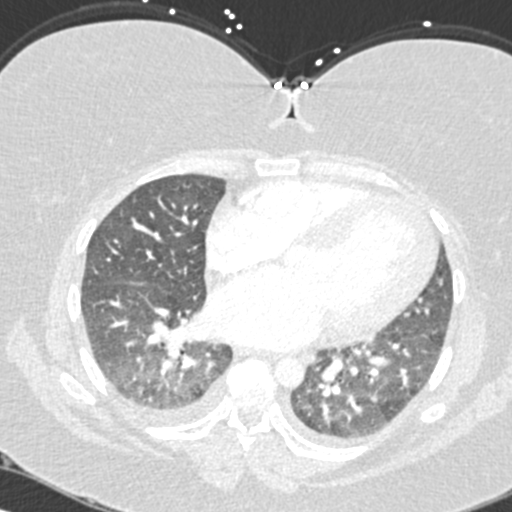
[im 137/263  soft-tissue]
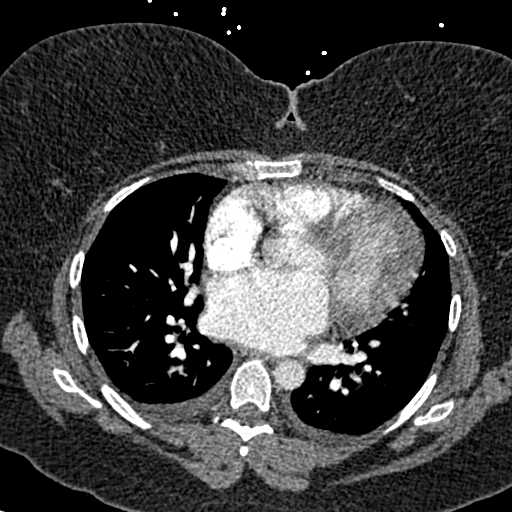
[im 160/263  lung]
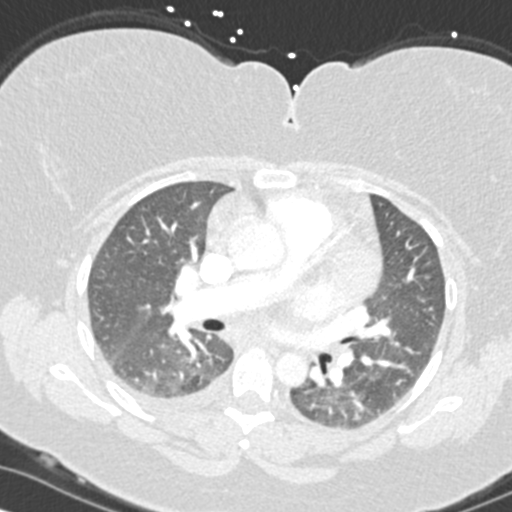
[im 171/263  soft-tissue]
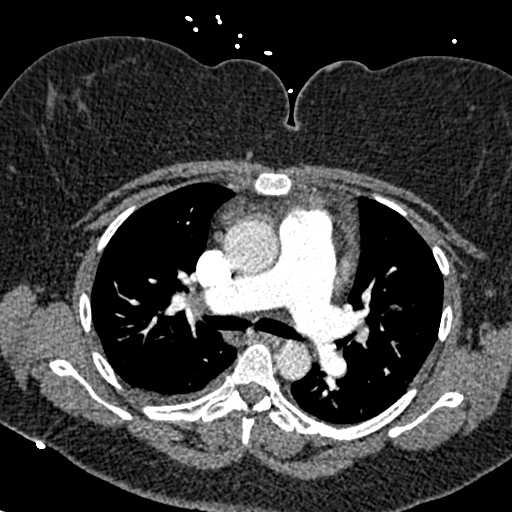
[im 194/263  lung]
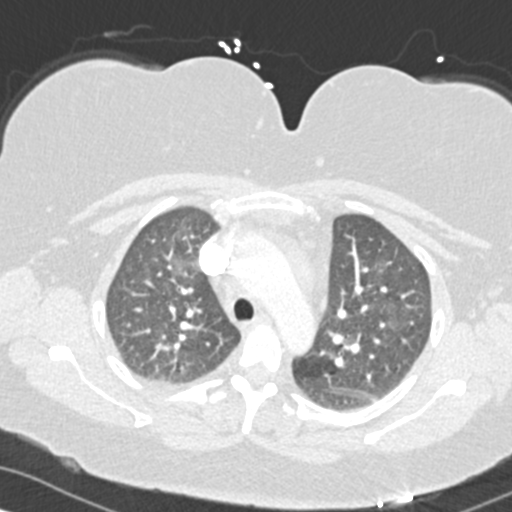
[im 217/263  soft-tissue]
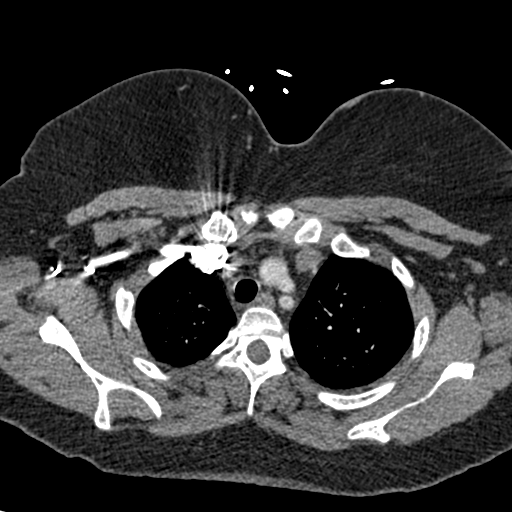
[im 228/263  lung]
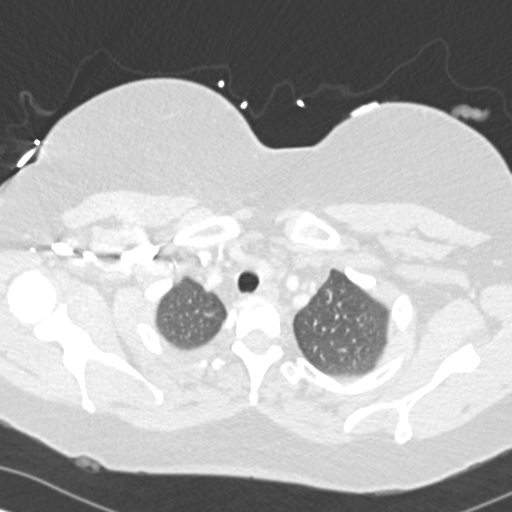
[im 251/263  soft-tissue]
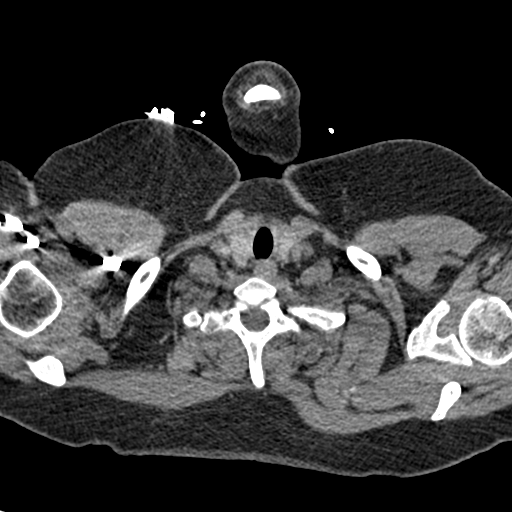

[Series 7: coronal mpr · coronal · 0.52mm/px · 3 of 151 slices shown]
[im 38/151  soft-tissue]
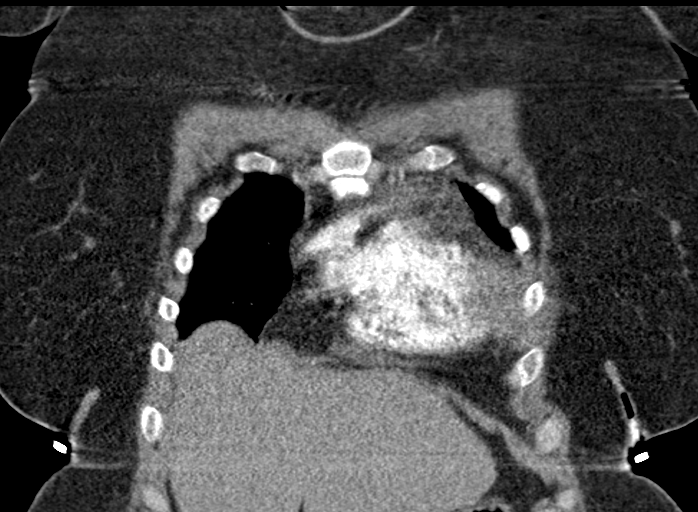
[im 76/151  soft-tissue]
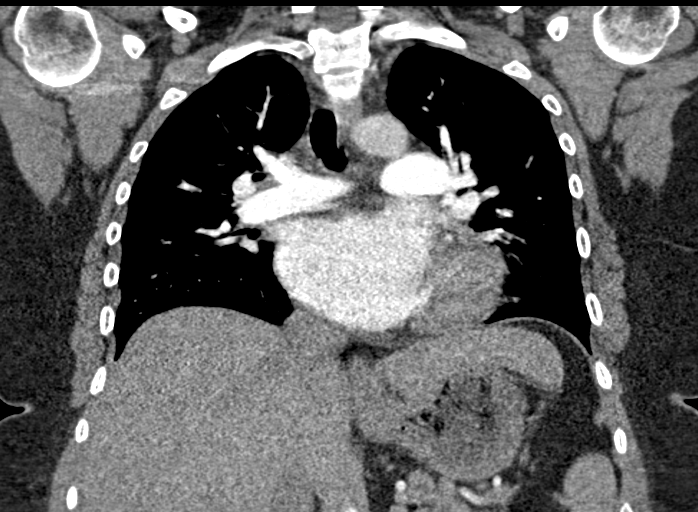
[im 113/151  soft-tissue]
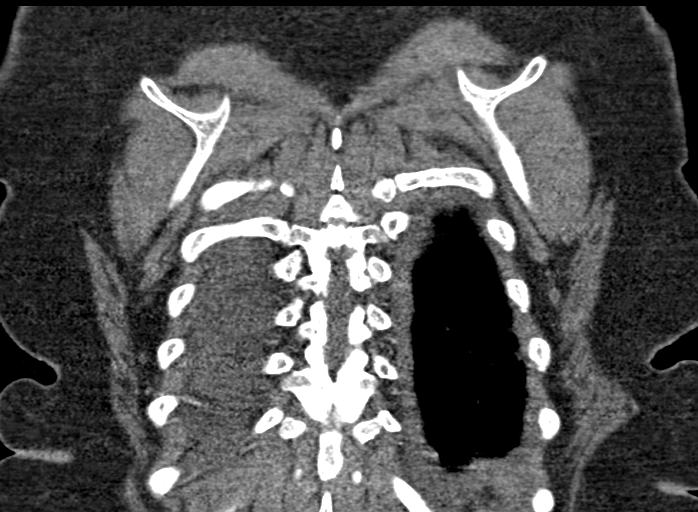

[17 of 46 positions shown; findings below may reference images not displayed]

FINDINGS: Cardiovascular: There is no demonstrable pulmonary embolus. There is
no thoracic aortic aneurysm or dissection. Visualized great vessels
appear normal. Note that the right and left common carotid arteries
arise as a common trunk, an anatomic variant. There is no
appreciable pericardial effusion or pericardial thickening. There is
mild left ventricular hypertrophy.

There is prominence of the main pulmonary outflow tract with a
measured diameter of 3.6 cm, a finding felt to be indicative of a
degree of pulmonary arterial hypertension.

Mediastinum/Nodes: Visualized thyroid appears normal. There is no
appreciable thoracic adenopathy. No esophageal lesions are evident.

Lungs/Pleura: There are small pleural effusions bilaterally. There
is patchy mosaic attenuation bilaterally, likely representing small
airways obstructive type disease with atelectasis. A mild degree of
superimposed interstitial pulmonary edema is questioned. There is no
well-defined airspace consolidation.

Upper Abdomen: Visualized upper abdominal structures appear
unremarkable.

Musculoskeletal: There are no blastic or lytic bone lesions.

Review of the MIP images confirms the above findings.
IMPRESSION: 1.  No demonstrable pulmonary embolus.

2. Small pleural effusions with suspected degree of pulmonary edema.
There may be early congestive heart failure.

3. There are areas of atelectatic change. There may be a degree of
small airways obstructive disease. The appearance does suggest that
there may be a combination of interstitial edema and small airways
obstruction. There is no frank consolidation.

4. Prominence of the main pulmonary outflow tract, a finding felt to
be indicative of pulmonary arterial hypertension.

5.  No evident thoracic adenopathy.

## 2019-10-29 ENCOUNTER — Ambulatory Visit: Payer: Self-pay | Admitting: Family Medicine

## 2019-11-06 MED FILL — FUROSEMIDE 20 MG TABS: 20 | 90 days supply | Qty: 90 | Fill #2

## 2019-11-08 ENCOUNTER — Encounter: Payer: Self-pay | Admitting: Cardiology

## 2019-11-08 ENCOUNTER — Ambulatory Visit: Payer: Self-pay | Admitting: Cardiology

## 2019-11-08 ENCOUNTER — Other Ambulatory Visit: Payer: Self-pay

## 2019-11-08 VITALS — BP 106/75 | HR 69 | Temp 97.6°F | Ht 62.0 in | Wt 236.0 lb

## 2019-11-08 DIAGNOSIS — I5032 Chronic diastolic (congestive) heart failure: Secondary | ICD-10-CM

## 2019-11-08 DIAGNOSIS — I10 Essential (primary) hypertension: Secondary | ICD-10-CM

## 2019-11-08 DIAGNOSIS — I422 Other hypertrophic cardiomyopathy: Secondary | ICD-10-CM

## 2019-11-08 NOTE — Progress Notes (Signed)
Patient is here for follow up visit.  Subjective:   Lydia Thompson, female    DOB: 10/29/1975, 44 y.o.   MRN: [id number]   Chief Complaint  Patient presents with  . Cardiomyopathy  . Follow-up    3 month     HPI  44 year old African American female with hypertrophic cardiomyopathy, history of SVT, TIA, abnormal stress test, here for follow up.   Given her abnormal exercise treadmill stress test, I recommended coronary CT angiogram for anatomic evaluation.  This showed no coronary artery disease with normal coronaries.  Pulmonary artery was dilated at 3.6 cm suggestive of pulmonary hypertension.  In 04/2018, patient was hospitalized, she had mild vascular congestion on chest Xray and mild elevation of BNP at 376. Her symptoms improved with one dose of IV lasix. Troponin HS was normal. Since then, her dyspnea symptoms have improved.  She denies any leg edema.  Her weight at home stays around 233-235 pounds.    Current Outpatient Medications on File Prior to Visit  Medication Sig Dispense Refill  . aspirin EC 81 MG tablet Take 1 tablet (81 mg total) by mouth daily. 90 tablet 3  . Cholecalciferol (VITAMIN D3) 50 MCG (2000 UT) CHEW Chew 1 mcg by mouth daily.    Marland Kitchen conjugated estrogens (PREMARIN) vaginal cream Place 1 Applicatorful vaginally daily. 42.5 g 12  . diltiazem (CARDIZEM CD) 180 MG 24 hr capsule Take 1 capsule (180 mg total) by mouth daily. 90 capsule 3  . estradiol (ESTRACE) 0.1 MG/GM vaginal cream Place 1 g vaginally daily. 42.5 g 1  . furosemide (LASIX) 20 MG tablet Take 1 tablet (20 mg total) by mouth daily as needed for fluid or edema. 60 tablet 3  . metoprolol tartrate (LOPRESSOR) 25 MG tablet Take 1 tablet (25 mg total) by mouth 2 (two) times daily. 180 tablet 3  . Naltrexone-buPROPion HCl ER (CONTRAVE) 8-90 MG TB12 Start 1 tablet every morning for 7 days, then 1 tablet twice daily for 7 days, then 2 tablets every morning and one every evening 120 tablet 0  .  spironolactone (ALDACTONE) 25 MG tablet Take 1 tablet (25 mg total) by mouth daily. 90 tablet 3   No current facility-administered medications on file prior to visit.    Cardiovascular studies:  EKG 11/08/2019: Sinus rhythm 52 bpm. Incomplete left bundle branch block.  Left atrial enlargement.   Echocardiogram 02/01/2019: Normal LV systolic function with EF 56%. Left ventricle cavity is normal in size. Severe asymmetric hypertrophy of the left ventricle. Resting LVOT max gradient 12 mmHg.  Normal global wall motion. Indeterminate diastolic filling pattern.  Left atrial cavity is severely dilated. Trileaflet aortic valve. No significant valvular stenosis. Trace aortic regurgitation. Moderate (Grade III) mitral regurgitation. Mild tricuspid regurgitation. Estimated pulmonary artery systolic pressure 24 mmHg. No significant change compared to previous study on 01/07/2018.  Coronary CT angiogram 10/17/2018: 1. Coronary calcium score of 0. This was 0 percentile for age and sex matched control. 2. Normal coronary origin with right dominance. 3. No evidence of CAD. 4. Moderately dilated pulmonary artery measuring 36 mm suggestive of pulmonary hypertension.   Exercise Treadmill Stress Test 01/12/2018:  Indication: SoB  The patient exercised on Bruce protocol for 4:00 min. Patient achieved  5.84 METS and reached HR  154 bpm, which is   85% of maximum age-predicted HR.  Stress test terminated due to 7/10 CP and Fatigue.  Exercise capacity was below average for age.  HR Response to Exercise: Exaggerated HR  response. BP Response to Exercise: Normal resting BP- appropriate response. Chest Pain: limiting. Arrhythmias: none. ST Changes:  Rest: Sinus rhythm 90 bpm. 1-2 mm T wave inversions leads II, III, aVF Stress: Sinus tachycardia. >2 mm T wave inversions leads II, III, aVF, V5,V 6. <1 mm ST elevation lead aVR. ST changes persist 2 min into recovery.   Overall Impression: Positive  stress test typical of diffuse subendocardial ischemia. Consider further cardiac workup.   Review of Systems  Cardiovascular: Negative for chest pain, dyspnea on exertion, leg swelling, palpitations and syncope.       Objective:    Vitals:   11/08/19 0840  BP: 106/75  Pulse: 69  Temp: 97.6 F (36.4 C)  SpO2: 98%     Physical Exam  Constitutional: No distress.  Neck: No JVD present.  Cardiovascular: Normal rate, regular rhythm and intact distal pulses.  Murmur (II/VI ESM RUSB) heard. Pulmonary/Chest: Effort normal and breath sounds normal. She has no wheezes. She has no rales.  Abdominal: There is no rebound.  Musculoskeletal:        General: No edema.  Psychiatric: She has a normal mood and affect.  Nursing note and vitals reviewed.       Assessment & Recommendations:    44 year old African American female with hypertrophic cardiomyopathy, history of SVT, TIA, atypical chest pain  1. Atypical chest pain Coronary CTA showed no CAD Likely noncardiac pain.  2. Hypertrophic cardiomyopathy (HCC) Septal thickness 2.1 cm. No indication for ICD. Continue metoprolol and diltiazem.  3. Chronic heart failure with preserved ejection fraction ([place]) Due to #2. Currently stable.  Tolerating low ose spironolactone. Take Lasix 20 mg daily.  Recommend daily weights. Discussed importance of low salt diet.   4. H/o TIA: No recurrence. Continue Aspirin 81 mg daily.  Echo in 6 months. F/u after that.  Nigel Mormon, MD [facility] Pager: [phone] Office: [phone] If no answer Cell [phone]

## 2019-11-13 ENCOUNTER — Other Ambulatory Visit: Payer: Self-pay | Admitting: Nurse Practitioner

## 2019-12-10 ENCOUNTER — Other Ambulatory Visit: Payer: Self-pay | Admitting: Nurse Practitioner

## 2019-12-13 ENCOUNTER — Encounter: Payer: Self-pay | Admitting: Nurse Practitioner

## 2019-12-13 ENCOUNTER — Other Ambulatory Visit: Payer: Self-pay

## 2019-12-13 ENCOUNTER — Ambulatory Visit (INDEPENDENT_AMBULATORY_CARE_PROVIDER_SITE_OTHER): Payer: Self-pay | Admitting: Nurse Practitioner

## 2019-12-13 VITALS — BP 109/55 | HR 58 | Temp 98.2°F | Resp 16 | Ht 62.0 in | Wt 240.0 lb

## 2019-12-13 DIAGNOSIS — Z0000 Encounter for general adult medical examination without abnormal findings: Secondary | ICD-10-CM

## 2019-12-13 DIAGNOSIS — I10 Essential (primary) hypertension: Secondary | ICD-10-CM

## 2019-12-13 DIAGNOSIS — I5032 Chronic diastolic (congestive) heart failure: Secondary | ICD-10-CM

## 2019-12-13 LAB — POCT URINALYSIS DIPSTICK
Bilirubin, UA: NEGATIVE
Blood, UA: NEGATIVE
Glucose, UA: NEGATIVE
Ketones, UA: NEGATIVE
Nitrite, UA: NEGATIVE
Protein, UA: NEGATIVE
Spec Grav, UA: 1.015 (ref 1.010–1.025)
Urobilinogen, UA: 0.2 E.U./dL
pH, UA: 5.5 (ref 5.0–8.0)

## 2019-12-13 NOTE — Patient Instructions (Addendum)
Low-Sodium Eating Plan Sodium, which is an element that makes up salt, helps you maintain a healthy balance of fluids in your body. Too much sodium can increase your blood pressure and cause fluid and waste to be held in your body. Your health care provider or dietitian may recommend following this plan if you have high blood pressure (hypertension), kidney disease, liver disease, or heart failure. Eating less sodium can help lower your blood pressure, reduce swelling, and protect your heart, liver, and kidneys. What are tips for following this plan? General guidelines  Most people on this plan should limit their sodium intake to 1,500-2,000 mg (milligrams) of sodium each day. Reading food labels   The Nutrition Facts label lists the amount of sodium in one serving of the food. If you eat more than one serving, you must multiply the listed amount of sodium by the number of servings.  Choose foods with less than 140 mg of sodium per serving.  Avoid foods with 300 mg of sodium or more per serving. Shopping  Look for lower-sodium products, often labeled as "low-sodium" or "no salt added."  Always check the sodium content even if foods are labeled as "unsalted" or "no salt added".  Buy fresh foods. ? Avoid canned foods and premade or frozen meals. ? Avoid canned, cured, or processed meats  Buy breads that have less than 80 mg of sodium per slice. Cooking  Eat more home-cooked food and less restaurant, buffet, and fast food.  Avoid adding salt when cooking. Use salt-free seasonings or herbs instead of table salt or sea salt. Check with your health care provider or pharmacist before using salt substitutes.  Cook with plant-based oils, such as canola, sunflower, or olive oil. Meal planning  When eating at a restaurant, ask that your food be prepared with less salt or no salt, if possible.  Avoid foods that contain MSG (monosodium glutamate). MSG is sometimes added to Chinese food,  bouillon, and some canned foods. What foods are recommended? The items listed may not be a complete list. Talk with your dietitian about what dietary choices are best for you. Grains Low-sodium cereals, including oats, puffed wheat and rice, and shredded wheat. Low-sodium crackers. Unsalted rice. Unsalted pasta. Low-sodium bread. Whole-grain breads and whole-grain pasta. Vegetables Fresh or frozen vegetables. "No salt added" canned vegetables. "No salt added" tomato sauce and paste. Low-sodium or reduced-sodium tomato and vegetable juice. Fruits Fresh, frozen, or canned fruit. Fruit juice. Meats and other protein foods Fresh or frozen (no salt added) meat, poultry, seafood, and fish. Low-sodium canned tuna and salmon. Unsalted nuts. Dried peas, beans, and lentils without added salt. Unsalted canned beans. Eggs. Unsalted nut butters. Dairy Milk. Soy milk. Cheese that is naturally low in sodium, such as ricotta cheese, fresh mozzarella, or Swiss cheese Low-sodium or reduced-sodium cheese. Cream cheese. Yogurt. Fats and oils Unsalted butter. Unsalted margarine with no trans fat. Vegetable oils such as canola or olive oils. Seasonings and other foods Fresh and dried herbs and spices. Salt-free seasonings. Low-sodium mustard and ketchup. Sodium-free salad dressing. Sodium-free light mayonnaise. Fresh or refrigerated horseradish. Lemon juice. Vinegar. Homemade, reduced-sodium, or low-sodium soups. Unsalted popcorn and pretzels. Low-salt or salt-free chips. What foods are not recommended? The items listed may not be a complete list. Talk with your dietitian about what dietary choices are best for you. Grains Instant hot cereals. Bread stuffing, pancake, and biscuit mixes. Croutons. Seasoned rice or pasta mixes. Noodle soup cups. Boxed or frozen macaroni and cheese. Regular salted   crackers. Self-rising flour. Vegetables Sauerkraut, pickled vegetables, and relishes. Olives. [place] fries. Onion rings.  Regular canned vegetables (not low-sodium or reduced-sodium). Regular canned tomato sauce and paste (not low-sodium or reduced-sodium). Regular tomato and vegetable juice (not low-sodium or reduced-sodium). Frozen vegetables in sauces. Meats and other protein foods Meat or fish that is salted, canned, smoked, spiced, or pickled. Bacon, ham, sausage, hotdogs, corned beef, chipped beef, packaged lunch meats, salt pork, jerky, pickled herring, anchovies, regular canned tuna, sardines, salted nuts. Dairy Processed cheese and cheese spreads. Cheese curds. Blue cheese. Feta cheese. String cheese. Regular cottage cheese. Buttermilk. Canned milk. Fats and oils Salted butter. Regular margarine. Ghee. Bacon fat. Seasonings and other foods Onion salt, garlic salt, seasoned salt, table salt, and sea salt. Canned and packaged gravies. Worcestershire sauce. Tartar sauce. Barbecue sauce. Teriyaki sauce. Soy sauce, including reduced-sodium. Steak sauce. Fish sauce. Oyster sauce. Cocktail sauce. Horseradish that you find on the shelf. Regular ketchup and mustard. Meat flavorings and tenderizers. Bouillon cubes. Hot sauce and Tabasco sauce. Premade or packaged marinades. Premade or packaged taco seasonings. Relishes. Regular salad dressings. Salsa. Potato and tortilla chips. Corn chips and puffs. Salted popcorn and pretzels. Canned or dried soups. Pizza. Frozen entrees and pot pies. Summary  Eating less sodium can help lower your blood pressure, reduce swelling, and protect your heart, liver, and kidneys.  Most people on this plan should limit their sodium intake to 1,500-2,000 mg (milligrams) of sodium each day.  Canned, boxed, and frozen foods are high in sodium. Restaurant foods, fast foods, and pizza are also very high in sodium. You also get sodium by adding salt to food.  Try to cook at home, eat more fresh fruits and vegetables, and eat less fast food, canned, processed, or prepared foods. This information is  not intended to replace advice given to you by your health care provider. Make sure you discuss any questions you have with your health care provider. Document Revised: 07/21/2017 Document Reviewed: 08/01/2016 Elsevier Patient Education  2020 [employer] Inc.  Calorie Counting for [employer] Calories are units of energy. Your body needs a certain amount of calories from food to keep you going throughout the day. When you eat more calories than your body needs, your body stores the extra calories as fat. When you eat fewer calories than your body needs, your body burns fat to get the energy it needs. Calorie counting means keeping track of how many calories you eat and drink each day. Calorie counting can be helpful if you need to lose weight. If you make sure to eat fewer calories than your body needs, you should lose weight. Ask your health care provider what a healthy weight is for you. For calorie counting to work, you will need to eat the right number of calories in a day in order to lose a healthy amount of weight per week. A dietitian can help you determine how many calories you need in a day and will give you suggestions on how to reach your calorie goal.  A healthy amount of weight to lose per week is usually 1-2 lb (0.5-0.9 kg). This usually means that your daily calorie intake should be reduced by 500-750 calories.  Eating 1,200 - 1,500 calories per day can help most women lose weight.  Eating 1,500 - 1,800 calories per day can help most men lose weight. What is my plan? My goal is to have __________ calories per day. If I have this many calories per day, I should  lose around __________ pounds per week. What do I need to know about calorie counting? In order to meet your daily calorie goal, you will need to:  Find out how many calories are in each food you would like to eat. Try to do this before you eat.  Decide how much of the food you plan to eat.  Write down what you ate and how  many calories it had. Doing this is called keeping a food log. To successfully lose weight, it is important to balance calorie counting with a healthy lifestyle that includes regular activity. Aim for 150 minutes of moderate exercise (such as walking) or 75 minutes of vigorous exercise (such as running) each week. Where do I find calorie information?  The number of calories in a food can be found on a Nutrition Facts label. If a food does not have a Nutrition Facts label, try to look up the calories online or ask your dietitian for help. Remember that calories are listed per serving. If you choose to have more than one serving of a food, you will have to multiply the calories per serving by the amount of servings you plan to eat. For example, the label on a package of bread might say that a serving size is 1 slice and that there are 90 calories in a serving. If you eat 1 slice, you will have eaten 90 calories. If you eat 2 slices, you will have eaten 180 calories. How do I keep a food log? Immediately after each meal, record the following information in your food log:  What you ate. Don't forget to include toppings, sauces, and other extras on the food.  How much you ate. This can be measured in cups, ounces, or number of items.  How many calories each food and drink had.  The total number of calories in the meal. Keep your food log near you, such as in a small notebook in your pocket, or use a mobile app or website. Some programs will calculate calories for you and show you how many calories you have left for the day to meet your goal. What are some calorie counting tips?   Use your calories on foods and drinks that will fill you up and not leave you hungry: ? Some examples of foods that fill you up are nuts and nut butters, vegetables, lean proteins, and high-fiber foods like whole grains. High-fiber foods are foods with more than 5 g fiber per serving. ? Drinks such as sodas, specialty  coffee drinks, alcohol, and juices have a lot of calories, yet do not fill you up.  Eat nutritious foods and avoid empty calories. Empty calories are calories you get from foods or beverages that do not have many vitamins or protein, such as candy, sweets, and soda. It is better to have a nutritious high-calorie food (such as an avocado) than a food with few nutrients (such as a bag of chips).  Know how many calories are in the foods you eat most often. This will help you calculate calorie counts faster.  Pay attention to calories in drinks. Low-calorie drinks include water and unsweetened drinks.  Pay attention to nutrition labels for "low fat" or "fat free" foods. These foods sometimes have the same amount of calories or more calories than the full fat versions. They also often have added sugar, starch, or salt, to make up for flavor that was removed with the fat.  Find a way of tracking calories that works  for you. Get creative. Try different apps or programs if writing down calories does not work for you. What are some portion control tips?  Know how many calories are in a serving. This will help you know how many servings of a certain food you can have.  Use a measuring cup to measure serving sizes. You could also try weighing out portions on a kitchen scale. With time, you will be able to estimate serving sizes for some foods.  Take some time to put servings of different foods on your favorite plates, bowls, and cups so you know what a serving looks like.  Try not to eat straight from a bag or box. Doing this can lead to overeating. Put the amount you would like to eat in a cup or on a plate to make sure you are eating the right portion.  Use smaller plates, glasses, and bowls to prevent overeating.  Try not to multitask (for example, watch TV or use your computer) while eating. If it is time to eat, sit down at a table and enjoy your food. This will help you to know when you are full.  It will also help you to be aware of what you are eating and how much you are eating. What are tips for following this plan? Reading food labels  Check the calorie count compared to the serving size. The serving size may be smaller than what you are used to eating.  Check the source of the calories. Make sure the food you are eating is high in vitamins and protein and low in saturated and trans fats. Shopping  Read nutrition labels while you shop. This will help you make healthy decisions before you decide to purchase your food.  Make a grocery list and stick to it. Cooking  Try to cook your favorite foods in a healthier way. For example, try baking instead of frying.  Use low-fat dairy products. Meal planning  Use more fruits and vegetables. Half of your plate should be fruits and vegetables.  Include lean proteins like poultry and fish. How do I count calories when eating out?  Ask for smaller portion sizes.  Consider sharing an entree and sides instead of getting your own entree.  If you get your own entree, eat only half. Ask for a box at the beginning of your meal and put the rest of your entree in it so you are not tempted to eat it.  If calories are listed on the menu, choose the lower calorie options.  Choose dishes that include vegetables, fruits, whole grains, low-fat dairy products, and lean protein.  Choose items that are boiled, broiled, grilled, or steamed. Stay away from items that are buttered, battered, fried, or served with cream sauce. Items labeled "crispy" are usually fried, unless stated otherwise.  Choose water, low-fat milk, unsweetened iced tea, or other drinks without added sugar. If you want an alcoholic beverage, choose a lower calorie option such as a glass of wine or light beer.  Ask for dressings, sauces, and syrups on the side. These are usually high in calories, so you should limit the amount you eat.  If you want a salad, choose a garden salad  and ask for grilled meats. Avoid extra toppings like bacon, cheese, or fried items. Ask for the dressing on the side, or ask for olive oil and vinegar or lemon to use as dressing.  Estimate how many servings of a food you are given. For example, a serving  of cooked rice is  cup or about the size of half a baseball. Knowing serving sizes will help you be aware of how much food you are eating at restaurants. The list below tells you how big or small some common portion sizes are based on everyday objects: ? 1 oz--4 stacked dice. ? 3 oz--1 deck of cards. ? 1 tsp--1 die. ? 1 Tbsp-- a ping-pong ball. ? 2 Tbsp--1 ping-pong ball. ?  cup-- baseball. ? 1 cup--1 baseball. Summary  Calorie counting means keeping track of how many calories you eat and drink each day. If you eat fewer calories than your body needs, you should lose weight.  A healthy amount of weight to lose per week is usually 1-2 lb (0.5-0.9 kg). This usually means reducing your daily calorie intake by 500-750 calories.  The number of calories in a food can be found on a Nutrition Facts label. If a food does not have a Nutrition Facts label, try to look up the calories online or ask your dietitian for help.  Use your calories on foods and drinks that will fill you up, and not on foods and drinks that will leave you hungry.  Use smaller plates, glasses, and bowls to prevent overeating. This information is not intended to replace advice given to you by your health care provider. Make sure you discuss any questions you have with your health care provider. Document Revised: 04/27/2018 Document Reviewed: 07/08/2016 Elsevier Patient Education  2020 [employer]  Constipation, Adult Constipation is when a person:  Poops (has a bowel movement) fewer times in a week than normal.  Has a hard time pooping.  Has poop that is dry, hard, or bigger than normal. Follow these instructions at home: Eating and drinking   Eat foods that  have a lot of fiber, such as: ? Fresh fruits and vegetables. ? Whole grains. ? Beans.  Eat less of foods that are high in fat, low in fiber, or overly processed, such as: ? [place] fries. ? Hamburgers. ? Cookies. ? Candy. ? Soda.  Drink enough fluid to keep your pee (urine) clear or pale yellow. General instructions  Exercise regularly or as told by your doctor.  Go to the restroom when you feel like you need to poop. Do not hold it in.  Take over-the-counter and prescription medicines only as told by your doctor. These include any fiber supplements.  Do pelvic floor retraining exercises, such as: ? Doing deep breathing while relaxing your lower belly (abdomen). ? Relaxing your pelvic floor while pooping.  Watch your condition for any changes.  Keep all follow-up visits as told by your doctor. This is important. Contact a doctor if:  You have pain that gets worse.  You have a fever.  You have not pooped for 4 days.  You throw up (vomit).  You are not hungry.  You lose weight.  You are bleeding from the anus.  You have thin, pencil-like poop (stool). Get help right away if:  You have a fever, and your symptoms suddenly get worse.  You leak poop or have blood in your poop.  Your belly feels hard or bigger than normal (is bloated).  You have very bad belly pain.  You feel dizzy or you faint. This information is not intended to replace advice given to you by your health care provider. Make sure you discuss any questions you have with your health care provider. Document Revised: 07/21/2017 Document Reviewed: 01/27/2016 Elsevier Patient Education  [phone]  Elsevier Inc.  Docusate capsules What is this medicine? DOCUSATE (doc CUE sayt) is stool softener. It helps prevent constipation and straining or discomfort associated with hard or dry stools. This medicine may be used for other purposes; ask your health care provider or pharmacist if you have questions. COMMON  BRAND NAME(S): [employer] Stool Softner, Colace, Colace Clear, Correctol, D.O.S., DC, Doc-Q-Lace, DocuLace, Docusoft S, DOK, DOK Extra Strength, Dulcolax, Genasoft, Kao-Tin, Kaopectate Liqui-Gels, Phillips Stool Softener, Stool Softener, Stool Softner DC, Sulfolax, Sur-Q-Lax, Surfak, Uni-Ease What should I tell my health care provider before I take this medicine? They need to know if you have any of these conditions:  nausea or vomiting  severe constipation  stomach pain  sudden change in bowel habit lasting more than 2 weeks  an unusual or allergic reaction to docusate, other medicines, foods, dyes, or preservatives  pregnant or trying to get pregnant  breast-feeding How should I use this medicine? Take this medicine by mouth with a glass of water. Follow the directions on the label. Take your doses at regular intervals. Do not take your medicine more often than directed. Talk to your pediatrician regarding the use of this medicine in children. While this medicine may be prescribed for children as young as 2 years for selected conditions, precautions do apply. Overdosage: If you think you have taken too much of this medicine contact a poison control center or emergency room at once. NOTE: This medicine is only for you. Do not share this medicine with others. What if I miss a dose? If you miss a dose, take it as soon as you can. If it is almost time for your next dose, take only that dose. Do not take double or extra doses. What may interact with this medicine?  mineral oil This list may not describe all possible interactions. Give your health care provider a list of all the medicines, herbs, non-prescription drugs, or dietary supplements you use. Also tell them if you smoke, drink alcohol, or use illegal drugs. Some items may interact with your medicine. What should I watch for while using this medicine? Do not use for more than one week without advice from your doctor or health care  professional. If your constipation returns, check with your doctor or health care professional. Drink plenty of water while taking this medicine. Drinking water helps decrease constipation. Stop using this medicine and contact your doctor or health care professional if you experience any rectal bleeding or do not have a bowel movement after use. These could be signs of a more serious condition. What side effects may I notice from receiving this medicine? Side effects that you should report to your doctor or health care professional as soon as possible:  allergic reactions like skin rash, itching or hives, swelling of the face, lips, or tongue Side effects that usually do not require medical attention (report to your doctor or health care professional if they continue or are bothersome):  diarrhea  stomach cramps  throat irritation This list may not describe all possible side effects. Call your doctor for medical advice about side effects. You may report side effects to FDA at 1-800-FDA-1088. Where should I keep my medicine? Keep out of the reach of children. Store at room temperature between 15 and 30 degrees C (59 and 86 degrees F). Throw away any unused medicine after the expiration date. NOTE: This sheet is a summary. It may not cover all possible information. If you have questions about this medicine, talk to  your doctor, pharmacist, or health care provider.  2020 Elsevier/Gold Standard (2007-11-29 15:56:49)

## 2019-12-13 NOTE — Progress Notes (Signed)
Established Patient Office Visit  Subjective:  Patient ID: Lydia Thompson, female    DOB: April 26, 1976  Age: 44 y.o. MRN: [id number]  CC:  Chief Complaint  Patient presents with  . Hypertension    sees cardio   . Follow-up    weight loss     HPI Lydia Thompson presents for follow-up.  She  has a past medical history of CHF (congestive heart failure) (HCC) and Hypertension.   Hypertension Patient is here for follow-up of elevated blood pressure. She is exercising and is trying to adherent to a low-salt diet. Blood pressure is well controlled at home. Cardiac symptoms: lower extremity edema. Patient denies chest pain, claudication, dyspnea, fatigue, irregular heart beat, palpitations and syncope. Cardiovascular risk factors: obesity (BMI >= 30 kg/m2) and sedentary lifestyle. Use of agents associated with hypertension: none. History of target organ damage: angina/ prior myocardial infarction.  She admits that she has started the Contrave but she is not having much success.  She is looking for other possible ways to help with weight loss.  She is unable to afford the Trulicity.  She is unable to take phentermine secondary to her heart condition.  She will need to complete cardiac rehab however this is not an option at this time because of her insurance issues.  She admits that she was willing to pay out-of-pocket however has not been contacted by the facility.   She has not been  Past Medical History:  Diagnosis Date  . CHF (congestive heart failure) (HCC)   . Hypertension     Past Surgical History:  Procedure Laterality Date  . ABDOMINAL HYSTERECTOMY      Family History  Problem Relation Age of Onset  . Hypertension Mother   . Heart disease Paternal Uncle     Social History   Socioeconomic History  . Marital status: Single    Spouse name: Not on file  . Number of children: 0  . Years of education: Not on file  . Highest education level: Not on file  Occupational History   . Not on file  Tobacco Use  . Smoking status: Never Smoker  . Smokeless tobacco: Never Used  Substance and Sexual Activity  . Alcohol use: Yes    Alcohol/week: 0.0 standard drinks    Comment: social  . Drug use: No  . Sexual activity: Not on file  Other Topics Concern  . Not on file  Social History Narrative  . Not on file   Social Determinants of Health   Financial Resource Strain:   . Difficulty of Paying Living Expenses:   Food Insecurity:   . Worried About Programme researcher, broadcasting/film/video in the Last Year:   . Barista in the Last Year:   Transportation Needs:   . Freight forwarder (Medical):   Marland Kitchen Lack of Transportation (Non-Medical):   Physical Activity:   . Days of Exercise per Week:   . Minutes of Exercise per Session:   Stress:   . Feeling of Stress :   Social Connections:   . Frequency of Communication with Friends and Family:   . Frequency of Social Gatherings with Friends and Family:   . Attends Religious Services:   . Active Member of Clubs or Organizations:   . Attends Banker Meetings:   Marland Kitchen Marital Status:   Intimate Partner Violence:   . Fear of Current or Ex-Partner:   . Emotionally Abused:   Marland Kitchen Physically Abused:   .  Sexually Abused:     Outpatient Medications Prior to Visit  Medication Sig Dispense Refill  . aspirin EC 81 MG tablet Take 1 tablet (81 mg total) by mouth daily. 90 tablet 3  . conjugated estrogens (PREMARIN) vaginal cream Place 1 Applicatorful vaginally daily. 42.5 g 12  . diltiazem (CARDIZEM CD) 180 MG 24 hr capsule Take 1 capsule (180 mg total) by mouth daily. 90 capsule 3  . estradiol (ESTRACE) 0.1 MG/GM vaginal cream Place 1 g vaginally daily. 42.5 g 1  . furosemide (LASIX) 20 MG tablet Take 1 tablet (20 mg total) by mouth daily as needed for fluid or edema. 60 tablet 3  . metoprolol tartrate (LOPRESSOR) 25 MG tablet Take 1 tablet (25 mg total) by mouth 2 (two) times daily. 180 tablet 3  . Naltrexone-buPROPion HCl ER  (CONTRAVE) 8-90 MG TB12 TAKE 1 TABLET BY MOUTH DAILY FOR 7 DAYS THEN 1 TWICE A DAY FOR 7 DAYS, THEN 2 IN THE AM AND 1 IN THE PM 120 tablet 0  . spironolactone (ALDACTONE) 25 MG tablet Take 1 tablet (25 mg total) by mouth daily. 90 tablet 3  . Cholecalciferol (VITAMIN D3) 50 MCG (2000 UT) CHEW Chew 1 mcg by mouth daily.     No facility-administered medications prior to visit.    No Known Allergies  ROS Review of Systems  All other systems reviewed and are negative.     Objective:    Physical Exam  Constitutional: She is oriented to person, place, and time. She appears well-developed.  Obese  Cardiovascular: Normal rate, regular rhythm and normal heart sounds.  Pulmonary/Chest: Effort normal and breath sounds normal.  Abdominal: Soft. Bowel sounds are normal.  Musculoskeletal:        General: Normal range of motion.     Cervical back: Normal range of motion.  Neurological: She is alert and oriented to person, place, and time.  Skin: Skin is warm and dry.  Psychiatric: She has a normal mood and affect. Her behavior is normal. Judgment and thought content normal.    BP (!) 109/55 (BP Location: Left Arm, Patient Position: Sitting, Cuff Size: Large)   Pulse (!) 58   Temp 98.2 F (36.8 C) (Oral)   Resp 16   Ht 5\' 2"  (1.575 m)   Wt 240 lb (108.9 kg)   BMI 43.90 kg/m  Wt Readings from Last 3 Encounters:  12/13/19 240 lb (108.9 kg)  11/08/19 236 lb (107 kg)  09/13/19 238 lb (108 kg)     Health Maintenance Due  Topic Date Due  . COVID-19 Vaccine (1) Never done    There are no preventive care reminders to display for this patient.  Lab Results  Component Value Date   TSH 1.100 10/18/2017   Lab Results  Component Value Date   WBC 12.6 (H) 05/03/2019   HGB 12.8 05/03/2019   HCT 40.0 05/03/2019   MCV 85.1 05/03/2019   PLT 252 05/03/2019   Lab Results  Component Value Date   NA 140 05/03/2019   K 3.5 05/03/2019   CO2 24 05/03/2019   GLUCOSE 112 (H) 05/03/2019    BUN 14 05/03/2019   CREATININE 0.81 05/03/2019   BILITOT 0.4 09/26/2018   ALKPHOS 63 09/26/2018   AST 12 09/26/2018   ALT 14 09/26/2018   PROT 6.9 09/26/2018   ALBUMIN 3.9 09/26/2018   CALCIUM 8.7 (L) 05/03/2019   ANIONGAP 7 05/03/2019   Lab Results  Component Value Date   CHOL 160 11/29/2017  Lab Results  Component Value Date   HDL 38 (L) 11/29/2017   Lab Results  Component Value Date   LDLCALC 87 11/29/2017   Lab Results  Component Value Date   TRIG 176 (H) 11/29/2017   Lab Results  Component Value Date   CHOLHDL 4.1 09/26/2017   Lab Results  Component Value Date   HGBA1C 5.0 09/26/2018      Assessment & Plan:   Problem List Items Addressed This Visit      High   HTN (hypertension) - Primary (Chronic)   Relevant Orders   Urinalysis Dipstick (Completed)   Comp. Metabolic Panel (12)     Unprioritized   Chronic heart failure with preserved ejection fraction (HCC)    Other Visit Diagnoses    Obesity, Class III, BMI 40-49.9 (morbid obesity) ([place])       Relevant Orders   TSH   Healthcare maintenance       Relevant Orders   Lipid panel      No orders of the defined types were placed in this encounter.   Follow-up: Return in about 3 months (around 03/13/2020).    Vevelyn Francois, NP

## 2019-12-14 LAB — COMP. METABOLIC PANEL (12)
AST: 12 IU/L (ref 0–40)
Albumin/Globulin Ratio: 1.3 (ref 1.2–2.2)
Albumin: 4 g/dL (ref 3.8–4.8)
Alkaline Phosphatase: 76 IU/L (ref 39–117)
BUN/Creatinine Ratio: 20 (ref 9–23)
BUN: 19 mg/dL (ref 6–24)
Bilirubin Total: 0.3 mg/dL (ref 0.0–1.2)
Calcium: 9.5 mg/dL (ref 8.7–10.2)
Chloride: 104 mmol/L (ref 96–106)
Creatinine, Ser: 0.96 mg/dL (ref 0.57–1.00)
GFR calc Af Amer: 84 mL/min/{1.73_m2} (ref >59)
GFR calc non Af Amer: 73 mL/min/{1.73_m2} (ref >59)
Globulin, Total: 3.2 g/dL (ref 1.5–4.5)
Glucose: 93 mg/dL (ref 65–99)
Potassium: 4.5 mmol/L (ref 3.5–5.2)
Sodium: 140 mmol/L (ref 134–144)
Total Protein: 7.2 g/dL (ref 6.0–8.5)

## 2019-12-14 LAB — LIPID PANEL
Chol/HDL Ratio: 4.4 ratio (ref 0.0–4.4)
Cholesterol, Total: 154 mg/dL (ref 100–199)
HDL: 35 mg/dL — ABNORMAL LOW (ref >39)
LDL Chol Calc (NIH): 102 mg/dL — ABNORMAL HIGH (ref 0–99)
Triglycerides: 88 mg/dL (ref 0–149)
VLDL Cholesterol Cal: 17 mg/dL (ref 5–40)

## 2019-12-14 LAB — TSH: TSH: 1.47 u[IU]/mL (ref 0.450–4.500)

## 2019-12-24 MED FILL — SPIRONOLACTONE 25 MG TABLET: 25 | 90 days supply | Qty: 90 | Fill #5

## 2020-01-06 MED FILL — METOPROLOL TARTRATE 25 MG T: 25 | 90 days supply | Qty: 180 | Fill #1

## 2020-01-06 MED FILL — DILTIAZEM 24HR ER 180 MG CA: 180 | 90 days supply | Qty: 90 | Fill #5

## 2020-01-30 ENCOUNTER — Other Ambulatory Visit: Payer: Self-pay | Admitting: Cardiology

## 2020-01-30 ENCOUNTER — Other Ambulatory Visit: Payer: Self-pay

## 2020-01-30 DIAGNOSIS — I422 Other hypertrophic cardiomyopathy: Secondary | ICD-10-CM

## 2020-01-30 DIAGNOSIS — I10 Essential (primary) hypertension: Secondary | ICD-10-CM

## 2020-01-30 DIAGNOSIS — I5189 Other ill-defined heart diseases: Secondary | ICD-10-CM

## 2020-01-30 MED ORDER — FUROSEMIDE 20 MG PO TABS: 20.0000 mg | ORAL_TABLET | Freq: Every day | ORAL | 3 refills | Status: DC | PRN

## 2020-02-19 ENCOUNTER — Telehealth: Payer: Self-pay

## 2020-02-19 ENCOUNTER — Encounter: Payer: Self-pay | Admitting: Emergency Medicine

## 2020-02-19 ENCOUNTER — Emergency Department (HOSPITAL_COMMUNITY)
Admission: EM | Admit: 2020-02-19 | Discharge: 2020-02-19 | Disposition: A | Discharge status: Home / Self Care | Payer: Self-pay | Attending: Emergency Medicine | Admitting: Emergency Medicine

## 2020-02-19 ENCOUNTER — Ambulatory Visit
Admission: EM | Admit: 2020-02-19 | Discharge: 2020-02-19 | Disposition: A | Discharge status: Home / Self Care | Payer: Self-pay | Attending: Physician Assistant | Admitting: Physician Assistant

## 2020-02-19 ENCOUNTER — Encounter (HOSPITAL_COMMUNITY): Payer: Self-pay | Admitting: Pediatrics

## 2020-02-19 ENCOUNTER — Other Ambulatory Visit: Payer: Self-pay

## 2020-02-19 ENCOUNTER — Emergency Department (HOSPITAL_COMMUNITY): Payer: Self-pay

## 2020-02-19 ENCOUNTER — Telehealth: Payer: Self-pay | Admitting: Physician Assistant

## 2020-02-19 DIAGNOSIS — I110 Hypertensive heart disease with heart failure: Secondary | ICD-10-CM | POA: Insufficient documentation

## 2020-02-19 DIAGNOSIS — R519 Headache, unspecified: Secondary | ICD-10-CM

## 2020-02-19 DIAGNOSIS — R110 Nausea: Secondary | ICD-10-CM

## 2020-02-19 DIAGNOSIS — R079 Chest pain, unspecified: Secondary | ICD-10-CM

## 2020-02-19 DIAGNOSIS — I509 Heart failure, unspecified: Secondary | ICD-10-CM | POA: Insufficient documentation

## 2020-02-19 DIAGNOSIS — Z7982 Long term (current) use of aspirin: Secondary | ICD-10-CM | POA: Insufficient documentation

## 2020-02-19 DIAGNOSIS — R0789 Other chest pain: Secondary | ICD-10-CM | POA: Insufficient documentation

## 2020-02-19 DIAGNOSIS — Z79899 Other long term (current) drug therapy: Secondary | ICD-10-CM | POA: Insufficient documentation

## 2020-02-19 LAB — BASIC METABOLIC PANEL
Anion gap: 9 (ref 5–15)
BUN: 14 mg/dL (ref 6–20)
CO2: 26 mmol/L (ref 22–32)
Calcium: 9.2 mg/dL (ref 8.9–10.3)
Chloride: 101 mmol/L (ref 98–111)
Creatinine, Ser: 0.91 mg/dL (ref 0.44–1.00)
GFR calc Af Amer: >60 mL/min (ref >60)
GFR calc non Af Amer: >60 mL/min (ref >60)
Glucose, Bld: 96 mg/dL (ref 70–99)
Potassium: 3.8 mmol/L (ref 3.5–5.1)
Sodium: 136 mmol/L (ref 135–145)

## 2020-02-19 LAB — I-STAT BETA HCG BLOOD, ED (MC, WL, AP ONLY): I-stat hCG, quantitative: <5 m[IU]/mL (ref <5)

## 2020-02-19 LAB — TROPONIN I (HIGH SENSITIVITY): Troponin I (High Sensitivity): 7 ng/L (ref <18)

## 2020-02-19 LAB — CBC
HCT: 41.2 % (ref 36.0–46.0)
Hemoglobin: 13.5 g/dL (ref 12.0–15.0)
MCH: 27.2 pg (ref 26.0–34.0)
MCHC: 32.8 g/dL (ref 30.0–36.0)
MCV: 83.1 fL (ref 80.0–100.0)
Platelets: 291 10*3/uL (ref 150–400)
RBC: 4.96 MIL/uL (ref 3.87–5.11)
RDW: 13.4 % (ref 11.5–15.5)
WBC: 10.6 10*3/uL — ABNORMAL HIGH (ref 4.0–10.5)
nRBC: 0 % (ref 0.0–0.2)

## 2020-02-19 MED ORDER — SODIUM CHLORIDE 0.9% FLUSH: 3.0000 mL | Freq: Once | INTRAVENOUS | Status: DC

## 2020-02-19 MED ORDER — ASPIRIN 81 MG PO CHEW
324.0000 mg | CHEWABLE_TABLET | Freq: Once | ORAL | Status: AC
  Administered 2020-02-19: 324 mg via ORAL

## 2020-02-19 NOTE — Telephone Encounter (Signed)
Spoke with patient in regards to recent symptoms of sharp chest pain, headaches, and nausea. Patient stated pain started 6/28. 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

## 2020-02-19 NOTE — Discharge Instructions (Signed)
Try zantac or pepcid twice a day.  Try to avoid things that may make this worse, most commonly these are spicy foods tomato based products fatty foods chocolate and peppermint.  Alcohol and tobacco can also make this worse.  Return to the emergency department for sudden worsening pain fever or inability to eat or drink. ° °

## 2020-02-19 NOTE — ED Provider Notes (Signed)
[facility] EMERGENCY DEPARTMENT Provider Note   CSN: [id number] Arrival date & time: 02/19/20  1302     History Chief Complaint  Patient presents with  . Chest Pain    Lydia Thompson is a 44 y.o. female.  64 yoF with a chief complaint of chest pain.  This occurred while she was at work.  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  Lasted for short period of time and then resolved.  No shortness of breath no diaphoresis no nausea or vomiting.  No exertional symptoms.  Has had some the same pain off and on but nothing since about 6 hours.  The history is provided by the patient.  Chest Pain Pain location:  L chest Pain quality: sharp   Pain radiates to:  Does not radiate Pain severity:  Moderate Onset quality:  Gradual Duration:  1 day Timing:  Intermittent Progression:  Resolved Chronicity:  New Relieved by:  Nothing Worsened by:  Certain positions Ineffective treatments:  None tried Associated symptoms: no dizziness, no fever, no headache, no nausea, no palpitations, no shortness of breath and no vomiting        Past Medical History:  Diagnosis Date  . CHF (congestive heart failure) (HCC)   . Hypertension     Patient Active Problem List   Diagnosis Date Noted  . Chronic heart failure with preserved ejection fraction (HCC) 11/08/2019  . H/O TIA (transient ischemic attack) and stroke 05/01/2019  . Hypertrophic cardiomyopathy (HCC) 04/28/2019  . SOB (shortness of breath) 09/26/2017  . HTN (hypertension) 09/26/2017  . Atypical chest pain 09/26/2017    Past Surgical History:  Procedure Laterality Date  . ABDOMINAL HYSTERECTOMY       OB History   No obstetric history on file.     Family History  Problem Relation Age of Onset  . Hypertension Mother   . Heart disease Paternal Uncle     Social History   Tobacco Use  . Smoking status: Never Smoker  .  Smokeless tobacco: Never Used  Vaping Use  . Vaping Use: Never used  Substance Use Topics  . Alcohol use: Yes    Alcohol/week: 0.0 standard drinks    Comment: social  . Drug use: No    Home Medications Prior to Admission medications   Medication Sig Start Date End Date Taking? Authorizing Provider  aspirin EC 81 MG tablet Take 1 tablet (81 mg total) by mouth daily. 07/26/19   Patwardhan, Anabel Bene, MD  Cholecalciferol (VITAMIN D3) 50 MCG (2000 UT) CHEW Chew 1 mcg by mouth daily.    [provider]  conjugated estrogens (PREMARIN) vaginal cream Place 1 Applicatorful vaginally daily. 09/13/19   Barbette Merino, NP  diltiazem (CARDIZEM CD) 180 MG 24 hr capsule Take 1 capsule (180 mg total) by mouth daily. 07/26/19   Patwardhan, Anabel Bene, MD  estradiol (ESTRACE) 0.1 MG/GM vaginal cream Place 1 g vaginally daily. 09/16/19 09/15/20  Barbette Merino, NP  furosemide (LASIX) 20 MG tablet Take 1 tablet (20 mg total) by mouth daily as needed for fluid or edema. 01/30/20   Patwardhan, Anabel Bene, MD  metoprolol tartrate (LOPRESSOR) 25 MG tablet Take 1 tablet (25 mg total) by mouth 2 (two) times daily. 07/26/19   Patwardhan, Anabel Bene, MD  spironolactone (ALDACTONE) 25 MG tablet Take 1 tablet (25 mg total) by mouth  daily. 07/26/19   Patwardhan, Anabel Bene, MD    Allergies    Patient has no known allergies.  Review of Systems   Review of Systems  Constitutional: Negative for chills and fever.  HENT: Negative for congestion and rhinorrhea.   Eyes: Negative for redness and visual disturbance.  Respiratory: Negative for shortness of breath and wheezing.   Cardiovascular: Positive for chest pain. Negative for palpitations.  Gastrointestinal: Negative for nausea and vomiting.  Genitourinary: Negative for dysuria and urgency.  Musculoskeletal: Negative for arthralgias and myalgias.  Skin: Negative for pallor and wound.  Neurological: Negative for dizziness and headaches.    Physical Exam Updated  Vital Signs BP 124/78 (BP Location: Right Arm)   Pulse 68   Temp 99.1 F (37.3 C) (Oral)   Resp 16   Ht 5\' 2"  (1.575 m)   Wt 104.8 kg   SpO2 100%   BMI 42.25 kg/m   Physical Exam Vitals and nursing note reviewed.  Constitutional:      General: She is not in acute distress.    Appearance: She is well-developed. She is not diaphoretic.  HENT:     Head: Normocephalic and atraumatic.  Eyes:     Pupils: Pupils are equal, round, and reactive to light.  Cardiovascular:     Rate and Rhythm: Normal rate and regular rhythm.     Heart sounds: No murmur heard.  No friction rub. No gallop.   Pulmonary:     Effort: Pulmonary effort is normal.     Breath sounds: No wheezing, rhonchi or rales.  Abdominal:     General: There is no distension.     Palpations: Abdomen is soft.     Tenderness: There is no abdominal tenderness.  Musculoskeletal:        General: No tenderness.     Cervical back: Normal range of motion and neck supple.  Skin:    General: Skin is warm and dry.  Neurological:     Mental Status: She is alert and oriented to person, place, and time.  Psychiatric:        Behavior: Behavior normal.     ED Results / Procedures / Treatments   Labs (all labs ordered are listed, but only abnormal results are displayed) Labs Reviewed  CBC - Abnormal; Notable for the following components:      Result Value   WBC 10.6 (*)    All other components within normal limits  BASIC METABOLIC PANEL  I-STAT BETA HCG BLOOD, ED ([facility], [facility], [facility] ONLY)  TROPONIN I (HIGH SENSITIVITY)  TROPONIN I (HIGH SENSITIVITY)    EKG EKG Interpretation  Date/Time:  Wednesday February 19 2020 13:44:51 EDT Ventricular Rate:  71 PR Interval:  160 QRS Duration: 102 QT Interval:  356 QTC Calculation: 386 R Axis:   30 Text Interpretation: Normal sinus rhythm Possible Left atrial enlargement Minimal voltage criteria for LVH, may be normal variant ( Sokolow-Lyon ) Nonspecific ST and T wave abnormality Abnormal  ECG No significant change since last tracing Confirmed by [id number] [phone]) on 02/19/2020 6:57:07 PM   Radiology DG Chest 2 View  Result Date: 02/19/2020 CLINICAL DATA:  Chest pain EXAM: CHEST - 2 VIEW COMPARISON:  05/03/2019 FINDINGS: The heart size and mediastinal contours are within normal limits. Both lungs are clear. The visualized skeletal structures are unremarkable. IMPRESSION: No active cardiopulmonary disease. Electronically Signed   By: 07/03/2019 M.D.   On: 02/19/2020 18:24    Procedures Procedures (including critical care time)  Medications Ordered in ED Medications  sodium chloride flush (NS) 0.9 % injection 3 mL (has no administration in time range)    ED Course  I have reviewed the triage vital signs and the nursing notes.  Pertinent labs & imaging results that were available during my care of the patient were reviewed by me and considered in my medical decision making (see chart for details).    MDM Rules/Calculators/A&P                          44 yo F with a chief complaints of chest pain.  Atypical in nature.  Negative troponin.  No pain in the past 6 hours.  Most likely this is reflux based on symptomatology when she bends over.  We will have her try Zantac or Pepcid.  PCP follow-up.  8:22 PM:  I have discussed the diagnosis/risks/treatment options with the patient and believe the pt to be eligible for discharge home to follow-up with PCP. We also discussed returning to the ED immediately if new or worsening sx occur. We discussed the sx which are most concerning (e.g., sudden worsening pain, fever, inability to tolerate by mouth) that necessitate immediate return. Medications administered to the patient during their visit and any new prescriptions provided to the patient are listed below.  Medications given during this visit Medications  sodium chloride flush (NS) 0.9 % injection 3 mL (has no administration in time range)     The patient appears reasonably  screen and/or stabilized for discharge and I doubt any other medical condition or other [facility] requiring further screening, evaluation, or treatment in the ED at this time prior to discharge.   Final Clinical Impression(s) / ED Diagnoses Final diagnoses:  Atypical chest pain    Rx / DC Orders ED Discharge Orders    None       Melene Plan, DO 02/19/20 2022

## 2020-02-19 NOTE — ED Notes (Signed)
Patient is being discharged from the Urgent Care and sent to the Emergency Department via private vehicle after refusing EMS transport . Per Linward Headland, APP, patient is in need of higher level of care due to complicated cardiac history, chest pain, and irregular EKG. Patient is aware and verbalizes understanding of plan of care.  Vitals:   02/19/20 1157  BP: 100/75  Pulse: 79  Resp: 18  Temp: 99.5 F (37.5 C)  SpO2: 98%

## 2020-02-19 NOTE — Discharge Instructions (Addendum)
44 year old female with history of CHF, HTN comes in for acute onset of left-sided chest pain after bending over at work.  Nausea without vomiting.  No significant shortness of breath, diaphoresis.  Denies orthopnea, leg swelling.  EKG NSR, 73bpm, T wave inversion to III, aVF, V4-6 that has changed from prior EKG.  Patient with current chest pain, 324mg  of aspirin given.

## 2020-02-19 NOTE — ED Triage Notes (Signed)
Pt presents to [facility] for assessment of headaches when waking x 3 days, nausea constant through out the day, and chest pain starting this morning when she was bending over at work.  Hx of CHF, sent here by cardiology.

## 2020-02-19 NOTE — Progress Notes (Signed)
Patient complains of HA located in center or forehead. Patient did take tylenol with relief and then the next morning the HA returns. Patient has not eaten today or taken medication today. Patient denies fevers but feels her body is warm. No HA at the moment. HA's are intermittently throughout the day. 131/88 this morning 156/96 yesterday morning

## 2020-02-19 NOTE — Progress Notes (Signed)
Established Patient Office Visit  Subjective:  Patient ID: Lydia Thompson, female    DOB: 1976-04-20  Age: 44 y.o. MRN: [id number]  CC:  Chief Complaint  Patient presents with  . Headache      Virtual Visit via Telephone Note  I connected with Fransisca Kaufmann on 02/19/20 at 11:00 AM EDT by telephone and verified that I am speaking with the correct person using two identifiers.  Location: Patient: In vehicle in parking lot of [facility]  Provider: Mobile Medicine Unit   I discussed the limitations, risks, security and privacy concerns of performing an evaluation and management service by telephone and the availability of in person appointments. I also discussed with the patient that there may be a patient responsible charge related to this service. The patient expressed understanding and agreed to proceed.   History of Present Illness: Reports that she started feeling poorly Monday, 2 days ago, states that she has been feeling "small sharp pains" in her chest, frontal headache and nausea without vomiting.  Reports that she has been using Tylenol with relief of headache, rested all day yesterday.  Has tried ginger ale without relief of nausea  Reports that she did eat seafood on Sunday, states that more sodium in it than she is normally allowed and has previously been affected by seafood.  Describes the chest pains as pounding and hitting, deep, not improving.  Reports that they are intermittent, states that she will do deep breathing with some relief.  Endorses blood pressure was elevated last night, approximately 150/80, this morning was 131/88.  Denies sick contacts, no new medications.      Observations/Objective: Patient history, current medications reviewed, no physical exam completed    Past Medical History:  Diagnosis Date  . CHF (congestive heart failure) (HCC)   . Hypertension     Past Surgical History:  Procedure Laterality Date  . ABDOMINAL  HYSTERECTOMY      Family History  Problem Relation Age of Onset  . Hypertension Mother   . Heart disease Paternal Uncle     Social History   Socioeconomic History  . Marital status: Single    Spouse name: Not on file  . Number of children: 0  . Years of education: Not on file  . Highest education level: Not on file  Occupational History  . Not on file  Tobacco Use  . Smoking status: Never Smoker  . Smokeless tobacco: Never Used  Vaping Use  . Vaping Use: Never used  Substance and Sexual Activity  . Alcohol use: Yes    Alcohol/week: 0.0 standard drinks    Comment: social  . Drug use: No  . Sexual activity: Not Currently  Other Topics Concern  . Not on file  Social History Narrative  . Not on file   Social Determinants of Health   Financial Resource Strain:   . Difficulty of Paying Living Expenses:   Food Insecurity:   . Worried About Programme researcher, broadcasting/film/video in the Last Year:   . Barista in the Last Year:   Transportation Needs:   . Freight forwarder (Medical):   Marland Kitchen Lack of Transportation (Non-Medical):   Physical Activity:   . Days of Exercise per Week:   . Minutes of Exercise per Session:   Stress:   . Feeling of Stress :   Social Connections:   . Frequency of Communication with Friends and Family:   . Frequency of Social Gatherings with  Friends and Family:   . Attends Religious Services:   . Active Member of Clubs or Organizations:   . Attends Banker Meetings:   Marland Kitchen Marital Status:   Intimate Partner Violence:   . Fear of Current or Ex-Partner:   . Emotionally Abused:   Marland Kitchen Physically Abused:   . Sexually Abused:     Outpatient Medications Prior to Visit  Medication Sig Dispense Refill  . aspirin EC 81 MG tablet Take 1 tablet (81 mg total) by mouth daily. 90 tablet 3  . Cholecalciferol (VITAMIN D3) 50 MCG (2000 UT) CHEW Chew 1 mcg by mouth daily.    Marland Kitchen conjugated estrogens (PREMARIN) vaginal cream Place 1 Applicatorful vaginally  daily. 42.5 g 12  . diltiazem (CARDIZEM CD) 180 MG 24 hr capsule Take 1 capsule (180 mg total) by mouth daily. 90 capsule 3  . estradiol (ESTRACE) 0.1 MG/GM vaginal cream Place 1 g vaginally daily. 42.5 g 1  . furosemide (LASIX) 20 MG tablet Take 1 tablet (20 mg total) by mouth daily as needed for fluid or edema. 60 tablet 3  . metoprolol tartrate (LOPRESSOR) 25 MG tablet Take 1 tablet (25 mg total) by mouth 2 (two) times daily. 180 tablet 3  . spironolactone (ALDACTONE) 25 MG tablet Take 1 tablet (25 mg total) by mouth daily. 90 tablet 3  . Naltrexone-buPROPion HCl ER (CONTRAVE) 8-90 MG TB12 TAKE 1 TABLET BY MOUTH DAILY FOR 7 DAYS THEN 1 TWICE A DAY FOR 7 DAYS, THEN 2 IN THE AM AND 1 IN THE PM 120 tablet 0   No facility-administered medications prior to visit.    No Known Allergies  ROS Review of Systems  Constitutional: Positive for fatigue. Negative for chills and fever.  HENT: Negative.   Eyes: Negative.   Respiratory: Positive for chest tightness. Negative for shortness of breath.   Cardiovascular: Positive for chest pain. Negative for palpitations.  Gastrointestinal: Positive for nausea. Negative for diarrhea and vomiting.  Endocrine: Negative.   Genitourinary: Negative.   Musculoskeletal: Negative.   Skin: Negative.   Allergic/Immunologic: Negative.   Neurological: Positive for headaches. Negative for dizziness, seizures and speech difficulty.  Hematological: Negative.   Psychiatric/Behavioral: Negative.       Objective:     There were no vitals taken for this visit. Wt Readings from Last 3 Encounters:  12/13/19 240 lb (108.9 kg)  11/08/19 236 lb (107 kg)  09/13/19 238 lb (108 kg)     Health Maintenance Due  Topic Date Due  . Hepatitis C Screening  Never done    There are no preventive care reminders to display for this patient.  Lab Results  Component Value Date   TSH 1.470 12/13/2019   Lab Results  Component Value Date   WBC 12.6 (H) 05/03/2019   HGB  12.8 05/03/2019   HCT 40.0 05/03/2019   MCV 85.1 05/03/2019   PLT 252 05/03/2019   Lab Results  Component Value Date   NA 140 12/13/2019   K 4.5 12/13/2019   CO2 24 05/03/2019   GLUCOSE 93 12/13/2019   BUN 19 12/13/2019   CREATININE 0.96 12/13/2019   BILITOT 0.3 12/13/2019   ALKPHOS 76 12/13/2019   AST 12 12/13/2019   ALT 14 09/26/2018   PROT 7.2 12/13/2019   ALBUMIN 4.0 12/13/2019   CALCIUM 9.5 12/13/2019   ANIONGAP 7 05/03/2019   Lab Results  Component Value Date   CHOL 154 12/13/2019   Lab Results  Component Value Date  HDL 35 (L) 12/13/2019   Lab Results  Component Value Date   LDLCALC 102 (H) 12/13/2019   Lab Results  Component Value Date   TRIG 88 12/13/2019   Lab Results  Component Value Date   CHOLHDL 4.4 12/13/2019   Lab Results  Component Value Date   HGBA1C 5.0 09/26/2018      Assessment & Plan:   Problem List Items Addressed This Visit    None    Visit Diagnoses    Chest pain, unspecified type    -  Primary   Nausea without vomiting       Acute nonintractable headache, unspecified headache type         Assessment and Plan: 1. Chest pain, unspecified type Patient was encouraged to present to urgent care for evaluation of chest pain with EKG.  Unable to perform EKG in mobile unit due to lack of supplies.  Patient was with home health care client and requested to drive herself to urgent care at [facility].  2. Nausea without vomiting   3. Acute nonintractable headache, unspecified headache type Currently resolved   Follow Up Instructions:    I discussed the assessment and treatment plan with the patient. The patient was provided an opportunity to ask questions and all were answered. The patient agreed with the plan and demonstrated an understanding of the instructions.   The patient was advised to call back or seek an in-person evaluation if the symptoms worsen or if the condition fails to improve as anticipated.  I provided  21  minutes of non-face-to-face time during this encounter.   Silas Muff S Mayers, PA-C   No orders of the defined types were placed in this encounter.   Follow-up: Return if symptoms worsen or fail to improve.    Kasandra Knudsen Mayers, PA-C

## 2020-02-19 NOTE — ED Provider Notes (Signed)
[facility]    CSN: [id number] Arrival date & time: 02/19/20  1150      History   Chief Complaint Chief Complaint  Patient presents with  . Chest Pain    HPI Lydia Thompson is a 44 y.o. female.   44 year old female with history of CHF, HTN comes in for acute onset of left-sided chest pain after bending over at work.  Nausea without vomiting.  No significant shortness of breath, diaphoresis.  Denies orthopnea, leg swelling.     Past Medical History:  Diagnosis Date  . CHF (congestive heart failure) (HCC)   . Hypertension     Patient Active Problem List   Diagnosis Date Noted  . Chronic heart failure with preserved ejection fraction (HCC) 11/08/2019  . H/O TIA (transient ischemic attack) and stroke 05/01/2019  . Hypertrophic cardiomyopathy (HCC) 04/28/2019  . SOB (shortness of breath) 09/26/2017  . HTN (hypertension) 09/26/2017  . Atypical chest pain 09/26/2017    Past Surgical History:  Procedure Laterality Date  . ABDOMINAL HYSTERECTOMY      OB History   No obstetric history on file.      Home Medications    Prior to Admission medications   Medication Sig Start Date End Date Taking? Authorizing Provider  aspirin EC 81 MG tablet Take 1 tablet (81 mg total) by mouth daily. 07/26/19   Patwardhan, Anabel Bene, MD  Cholecalciferol (VITAMIN D3) 50 MCG (2000 UT) CHEW Chew 1 mcg by mouth daily.    [provider]  conjugated estrogens (PREMARIN) vaginal cream Place 1 Applicatorful vaginally daily. 09/13/19   Barbette Merino, NP  diltiazem (CARDIZEM CD) 180 MG 24 hr capsule Take 1 capsule (180 mg total) by mouth daily. 07/26/19   Patwardhan, Anabel Bene, MD  estradiol (ESTRACE) 0.1 MG/GM vaginal cream Place 1 g vaginally daily. 09/16/19 09/15/20  Barbette Merino, NP  furosemide (LASIX) 20 MG tablet Take 1 tablet (20 mg total) by mouth daily as needed for fluid or edema. 01/30/20   Patwardhan, Anabel Bene, MD  metoprolol tartrate (LOPRESSOR) 25 MG tablet Take  1 tablet (25 mg total) by mouth 2 (two) times daily. 07/26/19   Patwardhan, Anabel Bene, MD  spironolactone (ALDACTONE) 25 MG tablet Take 1 tablet (25 mg total) by mouth daily. 07/26/19   Patwardhan, Anabel Bene, MD    Family History Family History  Problem Relation Age of Onset  . Hypertension Mother   . Heart disease Paternal Uncle     Social History Social History   Tobacco Use  . Smoking status: Never Smoker  . Smokeless tobacco: Never Used  Vaping Use  . Vaping Use: Never used  Substance Use Topics  . Alcohol use: Yes    Alcohol/week: 0.0 standard drinks    Comment: social  . Drug use: No     Allergies   Patient has no known allergies.   Review of Systems Review of Systems  Reason unable to perform ROS: See HPI as above.     Physical Exam Triage Vital Signs ED Triage Vitals  Enc Vitals Group     BP 02/19/20 1157 100/75     Pulse Rate 02/19/20 1157 79     Resp 02/19/20 1157 18     Temp 02/19/20 1157 99.5 F (37.5 C)     Temp Source 02/19/20 1157 Oral     SpO2 02/19/20 1157 98 %     Weight --      Height --  Head Circumference --      Peak Flow --      Pain Score 02/19/20 1159 7     Pain Loc --      Pain Edu? --      Excl. in GC? --    No data found.  Updated Vital Signs BP 100/75 (BP Location: Left Arm)   Pulse 79   Temp 99.5 F (37.5 C) (Oral)   Resp 18   SpO2 98%   Visual Acuity Right Eye Distance:   Left Eye Distance:   Bilateral Distance:    Right Eye Near:   Left Eye Near:    Bilateral Near:     Physical Exam Constitutional:      General: She is not in acute distress.    Appearance: Normal appearance. She is well-developed. She is not toxic-appearing or diaphoretic.  HENT:     Head: Normocephalic and atraumatic.  Eyes:     Conjunctiva/sclera: Conjunctivae normal.     Pupils: Pupils are equal, round, and reactive to light.  Cardiovascular:     Rate and Rhythm: Normal rate and regular rhythm.     Heart sounds: No murmur heard.    No friction rub. No gallop.   Pulmonary:     Effort: Pulmonary effort is normal. No respiratory distress.     Comments: Speaking in full sentences without difficulty. LCTAB Musculoskeletal:     Cervical back: Normal range of motion and neck supple.     Right lower leg: No edema.     Left lower leg: No edema.  Skin:    General: Skin is warm and dry.  Neurological:     Mental Status: She is alert and oriented to person, place, and time.      UC Treatments / Results  Labs (all labs ordered are listed, but only abnormal results are displayed) Labs Reviewed - No data to display  EKG   Radiology No results found.  Procedures Procedures (including critical care time)  Medications Ordered in UC Medications  aspirin chewable tablet 324 mg (has no administration in time range)    Initial Impression / Assessment and Plan / UC Course  I have reviewed the triage vital signs and the nursing notes.  Pertinent labs & imaging results that were available during my care of the patient were reviewed by me and considered in my medical decision making (see chart for details).    44 year old female with history of CHF, HTN comes in for acute onset of left-sided chest pain after bending over at work.  Nausea without vomiting.  No significant shortness of breath, diaphoresis.  Denies orthopnea, leg swelling.  EKG NSR, 73bpm, T wave inversion to III, aVF, V4-6 that has changed from prior EKG.  Patient with current chest pain, 324mg  of aspirin given. Discussed ED evaluation via EMS transport. Patient declined EMS transport, risks discussed. Patient to call 911 if symptoms worsens. Patient expresses understanding.  Final Clinical Impressions(s) / UC Diagnoses   Final diagnoses:  Chest pain, unspecified type   ED Prescriptions    None     PDMP not reviewed this encounter.   , PA-C 02/19/20 1222

## 2020-02-19 NOTE — ED Triage Notes (Signed)
From UC; c/o chest pain started today. Patient stated she's had some headache and nausea since Monday. Patient reported hx of HF.

## 2020-03-06 MED FILL — SPIRONOLACTONE 25 MG TABLET: 25 | 60 days supply | Qty: 60 | Fill #6

## 2020-03-13 ENCOUNTER — Ambulatory Visit: Payer: Self-pay | Admitting: Nurse Practitioner

## 2020-04-08 ENCOUNTER — Encounter: Payer: Self-pay | Admitting: Nurse Practitioner

## 2020-04-08 ENCOUNTER — Ambulatory Visit (INDEPENDENT_AMBULATORY_CARE_PROVIDER_SITE_OTHER): Payer: Self-pay | Admitting: Nurse Practitioner

## 2020-04-08 ENCOUNTER — Other Ambulatory Visit: Payer: Self-pay

## 2020-04-08 VITALS — BP 105/62 | HR 72 | Temp 97.1°F | Ht 62.0 in | Wt 228.2 lb

## 2020-04-08 DIAGNOSIS — R112 Nausea with vomiting, unspecified: Secondary | ICD-10-CM

## 2020-04-08 DIAGNOSIS — Z1159 Encounter for screening for other viral diseases: Secondary | ICD-10-CM

## 2020-04-08 DIAGNOSIS — N390 Urinary tract infection, site not specified: Secondary | ICD-10-CM

## 2020-04-08 LAB — POCT URINALYSIS DIP (CLINITEK)
Blood, UA: NEGATIVE
Glucose, UA: NEGATIVE mg/dL
Nitrite, UA: POSITIVE — AB
POC PROTEIN,UA: 100 — AB
Spec Grav, UA: 1.025 (ref 1.010–1.025)
Urobilinogen, UA: >=8 E.U./dL — AB
pH, UA: 5 (ref 5.0–8.0)

## 2020-04-08 MED ORDER — NITROFURANTOIN MONOHYD MACRO 100 MG PO CAPS
100.0000 mg | ORAL_CAPSULE | Freq: Two times a day (BID) | ORAL | 0 refills | Status: AC
Start: 2020-04-08 — End: 2020-04-15

## 2020-04-08 MED ORDER — PROMETHAZINE HCL 25 MG RE SUPP: 25.0000 mg | Freq: Four times a day (QID) | RECTAL | 0 refills | Status: DC | PRN

## 2020-04-08 NOTE — Patient Instructions (Signed)
Nausea and Vomiting, Adult Nausea is feeling sick to your stomach or feeling that you are about to throw up (vomit). Vomiting is when food in your stomach is thrown up and out of the mouth. Throwing up can make you feel weak. It can also make you lose too much water in your body (get dehydrated). If you lose too much water in your body, you may:  Feel tired.  Feel thirsty.  Have a dry mouth.  Have cracked lips.  Go pee (urinate) less often. Older adults and people with other diseases or a weak body defense system (immune system) are at higher risk for losing too much water in the body. If you feel sick to your stomach and you throw up, it is important to follow instructions from your doctor about how to take care of yourself. Follow these instructions at home: Watch your symptoms for any changes. Tell your doctor about them. Follow these instructions to care for yourself at home. Eating and drinking      Take an ORS (oral rehydration solution). This is a drink that is sold at pharmacies and stores.  Drink clear fluids in small amounts as you are able, such as: ? Water. ? Ice chips. ? Fruit juice that has water added (diluted fruit juice). ? Low-calorie sports drinks.  Eat bland, easy-to-digest foods in small amounts as you are able, such as: ? Bananas. ? Applesauce. ? Rice. ? Low-fat (lean) meats. ? Toast. ? Crackers.  Avoid drinking fluids that have a lot of sugar or caffeine in them. This includes energy drinks, sports drinks, and soda.  Avoid alcohol.  Avoid spicy or fatty foods. General instructions  Take over-the-counter and prescription medicines only as told by your doctor.  Drink enough fluid to keep your pee (urine) pale yellow.  Wash your hands often with soap and water. If you cannot use soap and water, use hand sanitizer.  Make sure that all people in your home wash their hands well and often.  Rest at home while you get better.  Watch your condition  for any changes.  Take slow and deep breaths when you feel sick to your stomach.  Keep all follow-up visits as told by your doctor. This is important. Contact a doctor if:  Your symptoms get worse.  You have new symptoms.  You have a fever.  You cannot drink fluids without throwing up.  You feel sick to your stomach for more than 2 days.  You feel light-headed or dizzy.  You have a headache.  You have muscle cramps.  You have a rash.  You have pain while peeing. Get help right away if:  You have pain in your chest, neck, arm, or jaw.  You feel very weak or you pass out (faint).  You throw up again and again.  You have throw up that is bright red or looks like black coffee grounds.  You have bloody or black poop (stools) or poop that looks like tar.  You have a very bad headache, a stiff neck, or both.  You have very bad pain, cramping, or bloating in your belly (abdomen).  You have trouble breathing.  You are breathing very quickly.  Your heart is beating very quickly.  Your skin feels cold and clammy.  You feel confused.  You have signs of losing too much water in your body, such as: ? Dark pee, very little pee, or no pee. ? Cracked lips. ? Dry mouth. ? Sunken eyes. ?   Sleepiness. ? Weakness. These symptoms may be an emergency. Do not wait to see if the symptoms will go away. Get medical help right away. Call your local emergency services (911 in the [place] Do not drive yourself to the hospital. Summary  Nausea is feeling sick to your stomach or feeling that you are about to throw up (vomit). Vomiting is when food in your stomach is thrown up and out of the mouth.  Follow instructions from your doctor about eating and drinking to keep from losing too much water in your body.  Take over-the-counter and prescription medicines only as told by your doctor.  Contact your doctor if your symptoms get worse or you have new symptoms.  Keep all follow-up  visits as told by your doctor. This is important. This information is not intended to replace advice given to you by your health care provider. Make sure you discuss any questions you have with your health care provider. Document Revised: 11/30/2018 Document Reviewed: 01/16/2018 Elsevier Patient Education  2020 [employer]  

## 2020-04-08 NOTE — Progress Notes (Signed)
[facility] Patient [facility] [address] [place], [place]  [address] Phone:  [phone]   Fax:  [phone]   Acute Office Visit  Subjective:    Patient ID: Lydia Thompson, female    DOB: Aug 13, 1976, 44 y.o.   MRN: [id number]  Chief Complaint  Patient presents with  . Headache    started thowing up, hasn't been eating a lot, body has felt drained.   Took Tylenol had low B\P reading this morning    HPI Patient is in today for acute pain. She  has a past medical history of CHF (congestive heart failure) (HCC) and Hypertension.   Nausea / Vomiting Patient complains of nausea and vomiting. Onset of symptoms was yesterday. Patient describes nausea as moderate. Vomiting has occurred 3 times over the past 2 days. Vomitus is described as normal gastric contents. Symptoms have been associated with fever to and chills . She admits that this started after eating a barbecue sandwich. Patient denies hematemesis and melena. Symptoms have gradually worsened. Evaluation to date has been none. Treatment to date has been none.  She is drinking fluids. She has been taking Tylenol, Gingerale and OJ. She is going to have COV-19 testing on tomorrow @ 1030.  Patient admits she did not want to go to emergency room  Past Medical History:  Diagnosis Date  . CHF (congestive heart failure) (HCC)   . Hypertension     Past Surgical History:  Procedure Laterality Date  . ABDOMINAL HYSTERECTOMY      Family History  Problem Relation Age of Onset  . Hypertension Mother   . Heart disease Paternal Uncle     Social History   Socioeconomic History  . Marital status: Single    Spouse name: Not on file  . Number of children: 0  . Years of education: Not on file  . Highest education level: Not on file  Occupational History  . Not on file  Tobacco Use  . Smoking status: Never Smoker  . Smokeless tobacco: Never Used  Vaping Use  . Vaping Use: Never used  Substance and Sexual Activity  . Alcohol  use: Yes    Alcohol/week: 0.0 standard drinks    Comment: social  . Drug use: No  . Sexual activity: Not Currently  Other Topics Concern  . Not on file  Social History Narrative  . Not on file   Social Determinants of Health   Financial Resource Strain:   . Difficulty of Paying Living Expenses:   Food Insecurity:   . Worried About Programme researcher, broadcasting/film/video in the Last Year:   . Barista in the Last Year:   Transportation Needs:   . Freight forwarder (Medical):   Marland Kitchen Lack of Transportation (Non-Medical):   Physical Activity:   . Days of Exercise per Week:   . Minutes of Exercise per Session:   Stress:   . Feeling of Stress :   Social Connections:   . Frequency of Communication with Friends and Family:   . Frequency of Social Gatherings with Friends and Family:   . Attends Religious Services:   . Active Member of Clubs or Organizations:   . Attends Banker Meetings:   Marland Kitchen Marital Status:   Intimate Partner Violence:   . Fear of Current or Ex-Partner:   . Emotionally Abused:   Marland Kitchen Physically Abused:   . Sexually Abused:     Outpatient Medications Prior to Visit  Medication Sig  Dispense Refill  . aspirin EC 81 MG tablet Take 1 tablet (81 mg total) by mouth daily. 90 tablet 3  . Cholecalciferol (VITAMIN D3) 50 MCG (2000 UT) CHEW Chew 1 mcg by mouth daily.    Marland Kitchen conjugated estrogens (PREMARIN) vaginal cream Place 1 Applicatorful vaginally daily. 42.5 g 12  . diltiazem (CARDIZEM CD) 180 MG 24 hr capsule Take 1 capsule (180 mg total) by mouth daily. 90 capsule 3  . estradiol (ESTRACE) 0.1 MG/GM vaginal cream Place 1 g vaginally daily. 42.5 g 1  . furosemide (LASIX) 20 MG tablet Take 1 tablet (20 mg total) by mouth daily as needed for fluid or edema. 60 tablet 3  . metoprolol tartrate (LOPRESSOR) 25 MG tablet Take 1 tablet (25 mg total) by mouth 2 (two) times daily. 180 tablet 3  . spironolactone (ALDACTONE) 25 MG tablet Take 1 tablet (25 mg total) by mouth daily.  90 tablet 3   No facility-administered medications prior to visit.    No Known Allergies  Review of Systems     Objective:    Physical Exam Constitutional:      Appearance: She is obese. She is not ill-appearing.  HENT:     Head: Normocephalic and atraumatic.  Eyes:     Extraocular Movements: Extraocular movements intact.  Cardiovascular:     Rate and Rhythm: Normal rate and regular rhythm.     Heart sounds: Normal heart sounds.  Pulmonary:     Effort: Pulmonary effort is normal.     Breath sounds: Normal breath sounds.  Abdominal:     General: Bowel sounds are normal.     Palpations: Abdomen is soft.  Musculoskeletal:        General: Normal range of motion.     Cervical back: Normal range of motion.  Skin:    Capillary Refill: Capillary refill takes less than 2 seconds.     Comments: clammy and cool  Neurological:     Mental Status: She is alert.  Psychiatric:        Mood and Affect: Mood normal.        Speech: Speech normal.        Behavior: Behavior normal.     BP 105/62   Pulse 72   Temp (!) 97.1 F (36.2 C) (Temporal)   Ht 5\' 2"  (1.575 m)   Wt 228 lb 3.2 oz (103.5 kg)   SpO2 98%   BMI 41.74 kg/m  Wt Readings from Last 3 Encounters:  04/08/20 228 lb 3.2 oz (103.5 kg)  02/19/20 231 lb (104.8 kg)  12/13/19 240 lb (108.9 kg)    Health Maintenance Due  Topic Date Due  . Hepatitis C Screening  Never done  . COVID-19 Vaccine (1) Never done    There are no preventive care reminders to display for this patient.   Lab Results  Component Value Date   TSH 1.470 12/13/2019   Lab Results  Component Value Date   WBC 10.6 (H) 02/19/2020   HGB 13.5 02/19/2020   HCT 41.2 02/19/2020   MCV 83.1 02/19/2020   PLT 291 02/19/2020   Lab Results  Component Value Date   NA 136 02/19/2020   K 3.8 02/19/2020   CO2 26 02/19/2020   GLUCOSE 96 02/19/2020   BUN 14 02/19/2020   CREATININE 0.91 02/19/2020   BILITOT 0.3 12/13/2019   ALKPHOS 76 12/13/2019    AST 12 12/13/2019   ALT 14 09/26/2018   PROT 7.2 12/13/2019   ALBUMIN  4.0 12/13/2019   CALCIUM 9.2 02/19/2020   ANIONGAP 9 02/19/2020   Lab Results  Component Value Date   CHOL 154 12/13/2019   Lab Results  Component Value Date   HDL 35 (L) 12/13/2019   Lab Results  Component Value Date   LDLCALC 102 (H) 12/13/2019   Lab Results  Component Value Date   TRIG 88 12/13/2019   Lab Results  Component Value Date   CHOLHDL 4.4 12/13/2019   Lab Results  Component Value Date   HGBA1C 5.0 09/26/2018       Assessment & Plan:   Problem List Items Addressed This Visit    None    Visit Diagnoses    Non-intractable vomiting with nausea, unspecified vomiting type    -  Primary   Relevant Orders   CBC with Differential/Platelet   Comp. Metabolic Panel (12)   POCT URINALYSIS DIP (CLINITEK) (Completed)   H. pylori breath test   Amylase   Lipase   POCT Urinalysis Dip Manual   Need for hepatitis C screening test       Relevant Orders   Hepatitis C antibody       No orders of the defined types were placed in this encounter.    Barbette Merino, NP

## 2020-04-09 ENCOUNTER — Encounter: Payer: Self-pay | Admitting: Nurse Practitioner

## 2020-04-09 LAB — COMP. METABOLIC PANEL (12)
AST: 87 IU/L — ABNORMAL HIGH (ref 0–40)
Albumin/Globulin Ratio: 1.3 (ref 1.2–2.2)
Albumin: 4 g/dL (ref 3.8–4.8)
Alkaline Phosphatase: 58 IU/L (ref 48–121)
BUN/Creatinine Ratio: 11 (ref 9–23)
BUN: 12 mg/dL (ref 6–24)
Bilirubin Total: 0.5 mg/dL (ref 0.0–1.2)
Calcium: 8.5 mg/dL — ABNORMAL LOW (ref 8.7–10.2)
Chloride: 102 mmol/L (ref 96–106)
Creatinine, Ser: 1.05 mg/dL — ABNORMAL HIGH (ref 0.57–1.00)
GFR calc Af Amer: 76 mL/min/{1.73_m2} (ref >59)
GFR calc non Af Amer: 66 mL/min/{1.73_m2} (ref >59)
Globulin, Total: 3.2 g/dL (ref 1.5–4.5)
Glucose: 99 mg/dL (ref 65–99)
Potassium: 3.7 mmol/L (ref 3.5–5.2)
Sodium: 139 mmol/L (ref 134–144)
Total Protein: 7.2 g/dL (ref 6.0–8.5)

## 2020-04-09 LAB — CBC WITH DIFFERENTIAL/PLATELET
Basophils Absolute: 0 10*3/uL (ref 0.0–0.2)
Basos: 0 %
EOS (ABSOLUTE): 0 10*3/uL (ref 0.0–0.4)
Eos: 0 %
Hematocrit: 41.2 % (ref 34.0–46.6)
Hemoglobin: 13.9 g/dL (ref 11.1–15.9)
Immature Grans (Abs): 0 10*3/uL (ref 0.0–0.1)
Immature Granulocytes: 1 %
Lymphocytes Absolute: 1.7 10*3/uL (ref 0.7–3.1)
Lymphs: 25 %
MCH: 27.4 pg (ref 26.6–33.0)
MCHC: 33.7 g/dL (ref 31.5–35.7)
MCV: 81 fL (ref 79–97)
Monocytes Absolute: 0.3 10*3/uL (ref 0.1–0.9)
Monocytes: 4 %
Neutrophils Absolute: 4.6 10*3/uL (ref 1.4–7.0)
Neutrophils: 70 %
Platelets: 219 10*3/uL (ref 150–450)
RBC: 5.07 x10E6/uL (ref 3.77–5.28)
RDW: 13.1 % (ref 11.7–15.4)
WBC: 6.5 10*3/uL (ref 3.4–10.8)

## 2020-04-09 LAB — AMYLASE: Amylase: 24 U/L — ABNORMAL LOW (ref 31–110)

## 2020-04-09 LAB — HEPATITIS C ANTIBODY: Hep C Virus Ab: <0.1 s/co ratio (ref 0.0–0.9)

## 2020-04-09 LAB — LIPASE: Lipase: 46 U/L (ref 14–72)

## 2020-04-09 MED FILL — DILTIAZEM 24HR ER 180 MG CA: 180 | 60 days supply | Qty: 60 | Fill #6

## 2020-04-10 ENCOUNTER — Telehealth: Payer: Self-pay | Admitting: Nurse Practitioner

## 2020-04-10 ENCOUNTER — Other Ambulatory Visit: Payer: Self-pay | Admitting: Nurse Practitioner

## 2020-04-10 DIAGNOSIS — B9681 Helicobacter pylori [H. pylori] as the cause of diseases classified elsewhere: Secondary | ICD-10-CM

## 2020-04-10 DIAGNOSIS — K2970 Gastritis, unspecified, without bleeding: Secondary | ICD-10-CM

## 2020-04-10 LAB — H. PYLORI BREATH TEST: H pylori Breath Test: POSITIVE — AB

## 2020-04-10 MED ORDER — OMEPRAZOLE 20 MG PO CPDR: 20.0000 mg | DELAYED_RELEASE_CAPSULE | Freq: Every day | ORAL | 0 refills | Status: DC

## 2020-04-10 MED ORDER — PEPTO-BISMOL 524 MG/30ML PO SUSP
30.0000 mL | Freq: Four times a day (QID) | ORAL | 0 refills | Status: DC | PRN
Start: 2020-04-10 — End: 2020-05-21

## 2020-04-10 MED ORDER — AMOXICILLIN 500 MG PO TABS: 1000.0000 mg | ORAL_TABLET | Freq: Two times a day (BID) | ORAL | 0 refills | Status: AC

## 2020-04-10 MED ORDER — CLARITHROMYCIN 500 MG PO TABS: 500.0000 mg | ORAL_TABLET | Freq: Two times a day (BID) | ORAL | 0 refills | Status: AC

## 2020-04-10 MED FILL — CLARITHROMYCIN 500 MG TAB: 500 | 14 days supply | Qty: 28 | Fill #0

## 2020-04-10 MED FILL — AMOXICILLIN 500 MG CAPSULE: 500 | 14 days supply | Qty: 56 | Fill #0

## 2020-04-10 MED FILL — OMEPRAZOLE 20 MG CAP: 20 | 14 days supply | Qty: 14 | Fill #0

## 2020-04-10 NOTE — Telephone Encounter (Signed)
Called and spoke to patient in detail about her recent lab results.  Treatment started for H. pylori.  Patient can hold nitrofurantoin at this time with the hopes that the amoxicillin and clindamycin will also treat her UTI.  All questions answered.

## 2020-04-28 ENCOUNTER — Other Ambulatory Visit: Payer: Self-pay

## 2020-04-29 ENCOUNTER — Other Ambulatory Visit: Payer: Self-pay

## 2020-05-11 MED FILL — SPIRONOLACTONE 25 MG TABLET: 25 | 90 days supply | Qty: 90 | Fill #0

## 2020-05-11 MED FILL — METOPROLOL TARTRATE 25 MG T: 25 | 90 days supply | Qty: 180 | Fill #2

## 2020-05-12 ENCOUNTER — Other Ambulatory Visit: Payer: Self-pay

## 2020-05-12 ENCOUNTER — Ambulatory Visit: Payer: Self-pay

## 2020-05-12 DIAGNOSIS — I422 Other hypertrophic cardiomyopathy: Secondary | ICD-10-CM

## 2020-05-13 MED FILL — FUROSEMIDE 20 MG TABS: 20 | 90 days supply | Qty: 90 | Fill #0

## 2020-05-15 ENCOUNTER — Ambulatory Visit: Payer: Self-pay | Admitting: Cardiology

## 2020-05-18 NOTE — Progress Notes (Signed)
Error

## 2020-05-20 ENCOUNTER — Ambulatory Visit: Payer: Self-pay | Admitting: Cardiology

## 2020-05-21 ENCOUNTER — Encounter: Payer: Self-pay | Admitting: Cardiology

## 2020-05-21 ENCOUNTER — Ambulatory Visit: Payer: Self-pay | Admitting: Cardiology

## 2020-05-21 ENCOUNTER — Other Ambulatory Visit: Payer: Self-pay

## 2020-05-21 ENCOUNTER — Other Ambulatory Visit: Payer: Self-pay | Admitting: Cardiology

## 2020-05-21 VITALS — BP 115/63 | HR 77 | Resp 16 | Ht 62.0 in | Wt 232.0 lb

## 2020-05-21 DIAGNOSIS — I422 Other hypertrophic cardiomyopathy: Secondary | ICD-10-CM

## 2020-05-21 DIAGNOSIS — I10 Essential (primary) hypertension: Secondary | ICD-10-CM

## 2020-05-21 DIAGNOSIS — Z8673 Personal history of transient ischemic attack (TIA), and cerebral infarction without residual deficits: Secondary | ICD-10-CM

## 2020-05-21 MED ORDER — ASPIRIN EC 81 MG PO TBEC: 81.0000 mg | DELAYED_RELEASE_TABLET | Freq: Every day | ORAL | 3 refills | Status: DC

## 2020-05-21 MED ORDER — FUROSEMIDE 20 MG PO TABS: 20.0000 mg | ORAL_TABLET | Freq: Every day | ORAL | 3 refills | Status: DC | PRN

## 2020-05-21 MED ORDER — DILTIAZEM HCL ER COATED BEADS 180 MG PO CP24: 180.0000 mg | ORAL_CAPSULE | Freq: Every day | ORAL | 3 refills | Status: DC

## 2020-05-21 MED ORDER — METOPROLOL SUCCINATE ER 50 MG PO TB24
50.0000 mg | ORAL_TABLET | Freq: Every day | ORAL | 3 refills | Status: DC
Start: 2020-05-21 — End: 2020-05-21

## 2020-05-21 MED FILL — METOPROLOL SUCCINATE ER 50: 50 | 90 days supply | Qty: 90 | Fill #0

## 2020-05-21 MED FILL — DILTIAZEM 24HR ER 180 MG CA: 180 | 30 days supply | Qty: 30 | Fill #0

## 2020-05-21 NOTE — Progress Notes (Signed)
Patient is here for follow up visit.  Subjective:   Lydia Thompson, female    DOB: 1976/03/13, 44 y.o.   MRN: [id number]   Chief Complaint  Patient presents with  . Congestive Heart Failure  . Cardiomyopathy    Hypertrophic  . Follow-up    6 month     HPI  44 year old African American female with hypertrophic cardiomyopathy, history of SVT, TIA.  Patient recently had Covid in August 2021, now completely recovered. She denies chest pain, shortness of breath, palpitations, leg edema, orthopnea, PND, TIA/syncope.  Reviewed recent echocardiogram with the patient, discussed with her.   Current Outpatient Medications on File Prior to Visit  Medication Sig Dispense Refill  . aspirin EC 81 MG tablet Take 1 tablet (81 mg total) by mouth daily. 90 tablet 3  . Cholecalciferol (VITAMIN D3) 50 MCG (2000 UT) CHEW Chew 1 mcg by mouth daily.    Marland Kitchen conjugated estrogens (PREMARIN) vaginal cream Place 1 Applicatorful vaginally daily. 42.5 g 12  . diltiazem (CARDIZEM CD) 180 MG 24 hr capsule Take 1 capsule (180 mg total) by mouth daily. 90 capsule 3  . estradiol (ESTRACE) 0.1 MG/GM vaginal cream Place 1 g vaginally daily. 42.5 g 1  . furosemide (LASIX) 20 MG tablet Take 1 tablet (20 mg total) by mouth daily as needed for fluid or edema. 60 tablet 3  . metoprolol tartrate (LOPRESSOR) 25 MG tablet Take 1 tablet (25 mg total) by mouth 2 (two) times daily. 180 tablet 3  . spironolactone (ALDACTONE) 25 MG tablet Take 1 tablet (25 mg total) by mouth daily. 90 tablet 3   No current facility-administered medications on file prior to visit.    Cardiovascular studies:  Echocardiogram 05/12/2020:  Left ventricle cavity is normal in size. Severe concentric hypertrophy of  the left ventricle. Normal global wall motion. Normal LV systolic function  with EF 56%. Indeterminate diastolic filling pattern.  Left atrial cavity is mildly dilated.  Posteriorly directed, moderate (Grade II) mitral  regurgitation.  Structurally normal trileaflet aortic valve. Resting LVOT max gradient 18  mmHg. No significant valvular stenosis. No regurgitation.  Mild tricuspid regurgitation.  No evidence of pulmonary hypertension.  No significant change compared to previous study on 02/01/2019.  EKG 11/08/2019: Sinus rhythm 52 bpm. Incomplete left bundle branch block.  Left atrial enlargement.   Echocardiogram 02/01/2019: Normal LV systolic function with EF 56%. Left ventricle cavity is normal in size. Severe asymmetric hypertrophy of the left ventricle. Resting LVOT max gradient 12 mmHg.  Normal global wall motion. Indeterminate diastolic filling pattern.  Left atrial cavity is severely dilated. Trileaflet aortic valve. No significant valvular stenosis. Trace aortic regurgitation. Moderate (Grade III) mitral regurgitation. Mild tricuspid regurgitation. Estimated pulmonary artery systolic pressure 24 mmHg. No significant change compared to previous study on 01/07/2018.  Coronary CT angiogram 10/17/2018: 1. Coronary calcium score of 0. This was 0 percentile for age and sex matched control. 2. Normal coronary origin with right dominance. 3. No evidence of CAD. 4. Moderately dilated pulmonary artery measuring 36 mm suggestive of pulmonary hypertension.   Exercise Treadmill Stress Test 01/12/2018:  Indication: SoB  The patient exercised on Bruce protocol for 4:00 min. Patient achieved  5.84 METS and reached HR  154 bpm, which is   43% of maximum age-predicted HR.  Stress test terminated due to 7/10 CP and Fatigue.  Exercise capacity was below average for age.  HR Response to Exercise: Exaggerated HR response. BP Response to Exercise: Normal resting BP-  appropriate response. Chest Pain: limiting. Arrhythmias: none. ST Changes:  Rest: Sinus rhythm 90 bpm. 1-2 mm T wave inversions leads II, III, aVF Stress: Sinus tachycardia. >2 mm T wave inversions leads II, III, aVF, V5,V 6. <1 mm ST  elevation lead aVR. ST changes persist 2 min into recovery.   Overall Impression: Positive stress test typical of diffuse subendocardial ischemia. Consider further cardiac workup.   Review of Systems  Cardiovascular: Negative for chest pain, dyspnea on exertion, leg swelling, palpitations and syncope.       Objective:    Vitals:   05/21/20 1143  BP: 115/63  Pulse: 77  Resp: 16  SpO2: 97%     Physical Exam Vitals and nursing note reviewed.  Constitutional:      General: She is not in acute distress. Neck:     Vascular: No JVD.  Cardiovascular:     Rate and Rhythm: Normal rate and regular rhythm.     Pulses: Intact distal pulses.     Heart sounds: Murmur (II/VI ESM RUSB) heard.   Pulmonary:     Effort: Pulmonary effort is normal.     Breath sounds: Normal breath sounds. No wheezing or rales.  Abdominal:     Tenderness: There is no rebound.         Assessment & Recommendations:    44 year old [place] American female with hypertrophic cardiomyopathy, history of SVT, TIA.   Hypertrophic cardiomyopathy (HCC) Septal thickness 2.1 cm. No indication for ICD. Continue metoprolol and diltiazem. Switch metoprolol tartrate 25 mg twice daily to metoprolol succinate 50 mg daily  Chronic heart failure with preserved ejection fraction (HCC) Due to #2. Currently stable.  Tolerating low ose spironolactone. Recommend daily weights. Discussed importance of low salt diet.   H/o TIA: No recurrence. Continue Aspirin 81 mg daily.  F/u in 1 year after repeat echocardiogram  Elder Negus, MD [facility] Pager: [phone] Office: [phone] If no answer Cell [phone]

## 2020-06-29 ENCOUNTER — Telehealth: Payer: Self-pay

## 2020-06-29 NOTE — Telephone Encounter (Signed)
Patient called asking if it would be a problem for her to have a cryoskin facial. Please advise.

## 2020-06-30 NOTE — Telephone Encounter (Signed)
I am not sure what that is. I am not aware of any cardiac contraindications

## 2020-07-01 NOTE — Telephone Encounter (Signed)
Called patient, she is aware

## 2020-07-07 MED FILL — DILTIAZEM 24HR ER 180 MG CA: 180 | 30 days supply | Qty: 30 | Fill #1

## 2020-07-09 ENCOUNTER — Encounter: Payer: Self-pay | Admitting: Nurse Practitioner

## 2020-07-09 ENCOUNTER — Other Ambulatory Visit: Payer: Self-pay

## 2020-07-09 ENCOUNTER — Ambulatory Visit (INDEPENDENT_AMBULATORY_CARE_PROVIDER_SITE_OTHER): Payer: Self-pay | Admitting: Nurse Practitioner

## 2020-07-09 VITALS — BP 112/76 | HR 81 | Temp 97.7°F | Resp 18 | Ht 63.0 in | Wt 240.4 lb

## 2020-07-09 DIAGNOSIS — Z13220 Encounter for screening for lipoid disorders: Secondary | ICD-10-CM

## 2020-07-09 DIAGNOSIS — Z131 Encounter for screening for diabetes mellitus: Secondary | ICD-10-CM

## 2020-07-09 DIAGNOSIS — I5032 Chronic diastolic (congestive) heart failure: Secondary | ICD-10-CM

## 2020-07-09 MED ORDER — TRULICITY 1.5 MG/0.5ML ~~LOC~~ SOAJ: 1.5000 mg | SUBCUTANEOUS | 0 refills | Status: DC

## 2020-07-09 MED ORDER — SAXENDA 18 MG/3ML ~~LOC~~ SOPN: PEN_INJECTOR | SUBCUTANEOUS | 2 refills | Status: DC

## 2020-07-09 NOTE — Progress Notes (Signed)
[facility] Patient [facility] [address] [place], [place]  [address] Phone:  [phone]   Fax:  [phone]   Established Patient Office Visit  Subjective:  Patient ID: Lydia Thompson, female    DOB: November 03, 1975  Age: 44 y.o. MRN: [id number]  CC:  Chief Complaint  Patient presents with  . Follow-up    HPI Lydia Thompson presents for follow up. She  has a past medical history of CHF (congestive heart failure) (HCC) and Hypertension.   She admits that she was diagnosed with Covid in August.  She was in the office within that timeframe.  She continues to have shortness of breath and chest discomfort however this is related to her heart failure. She continues to be concerned about her weight and her inability to use phentermine.  She admits that she use this several times when she was a lot younger i.e. teenager.  She has tried Contrave and did not feel like this was effective and she had side effects.  She has been prescribed Saxenda however because of the lack of insurance this was too expensive.  She has a desire for Trulicity which she knows is in the same price range.  Past Medical History:  Diagnosis Date  . CHF (congestive heart failure) (HCC)   . Hypertension     Past Surgical History:  Procedure Laterality Date  . ABDOMINAL HYSTERECTOMY      Family History  Problem Relation Age of Onset  . Hypertension Mother   . Heart disease Paternal Uncle     Social History   Socioeconomic History  . Marital status: Single    Spouse name: Not on file  . Number of children: 0  . Years of education: Not on file  . Highest education level: Not on file  Occupational History  . Not on file  Tobacco Use  . Smoking status: Never Smoker  . Smokeless tobacco: Never Used  Vaping Use  . Vaping Use: Never used  Substance and Sexual Activity  . Alcohol use: Yes    Alcohol/week: 0.0 standard drinks    Comment: social  . Drug use: No  . Sexual activity: Not Currently  Other  Topics Concern  . Not on file  Social History Narrative  . Not on file   Social Determinants of Health   Financial Resource Strain:   . Difficulty of Paying Living Expenses: Not on file  Food Insecurity:   . Worried About Programme researcher, broadcasting/film/video in the Last Year: Not on file  . Ran Out of Food in the Last Year: Not on file  Transportation Needs:   . Lack of Transportation (Medical): Not on file  . Lack of Transportation (Non-Medical): Not on file  Physical Activity:   . Days of Exercise per Week: Not on file  . Minutes of Exercise per Session: Not on file  Stress:   . Feeling of Stress : Not on file  Social Connections:   . Frequency of Communication with Friends and Family: Not on file  . Frequency of Social Gatherings with Friends and Family: Not on file  . Attends Religious Services: Not on file  . Active Member of Clubs or Organizations: Not on file  . Attends Banker Meetings: Not on file  . Marital Status: Not on file  Intimate Partner Violence:   . Fear of Current or Ex-Partner: Not on file  . Emotionally Abused: Not on file  . Physically Abused: Not on  file  . Sexually Abused: Not on file    Outpatient Medications Prior to Visit  Medication Sig Dispense Refill  . aspirin EC 81 MG tablet Take 1 tablet (81 mg total) by mouth daily. 90 tablet 3  . Cholecalciferol (VITAMIN D3) 50 MCG (2000 UT) CHEW Chew 1 mcg by mouth daily.    Marland Kitchen conjugated estrogens (PREMARIN) vaginal cream Place 1 Applicatorful vaginally daily. 42.5 g 12  . diltiazem (CARDIZEM CD) 180 MG 24 hr capsule Take 1 capsule (180 mg total) by mouth daily. 90 capsule 3  . estradiol (ESTRACE) 0.1 MG/GM vaginal cream Place 1 g vaginally daily. 42.5 g 1  . furosemide (LASIX) 20 MG tablet Take 1 tablet (20 mg total) by mouth daily as needed for fluid or edema. 60 tablet 3  . metoprolol succinate (TOPROL-XL) 50 MG 24 hr tablet Take 1 tablet (50 mg total) by mouth daily. Take with or immediately following a  meal. 90 tablet 3  . spironolactone (ALDACTONE) 25 MG tablet Take 1 tablet (25 mg total) by mouth daily. 90 tablet 3   No facility-administered medications prior to visit.    No Known Allergies  ROS Review of Systems  Respiratory: Positive for shortness of breath (daily with the heart failure).   Cardiovascular: Negative for chest pain.  Gastrointestinal: Positive for constipation. Negative for nausea.  Endocrine: Negative.   Genitourinary: Negative.   Musculoskeletal: Negative.   Skin: Negative.   Hematological: Negative.       Objective:    Physical Exam Constitutional:      General: She is not in acute distress.    Appearance: She is obese. She is not ill-appearing, toxic-appearing or diaphoretic.  Cardiovascular:     Rate and Rhythm: Normal rate.     Pulses: Normal pulses.  Pulmonary:     Effort: Pulmonary effort is normal.     Breath sounds: Normal breath sounds.  Abdominal:     General: Bowel sounds are normal.     Palpations: Abdomen is soft.  Musculoskeletal:        General: Normal range of motion.     Cervical back: Normal range of motion.  Skin:    General: Skin is warm and dry.     Capillary Refill: Capillary refill takes less than 2 seconds.  Neurological:     General: No focal deficit present.     Mental Status: She is alert and oriented to person, place, and time.  Psychiatric:        Mood and Affect: Mood normal.        Behavior: Behavior normal.        Thought Content: Thought content normal.        Judgment: Judgment normal.     BP 112/76 (BP Location: Left Arm, Patient Position: Sitting, Cuff Size: Large)   Pulse 81   Temp 97.7 F (36.5 C) (Temporal)   Resp 18   Ht 5\' 3"  (1.6 m)   Wt 240 lb 6.4 oz (109 kg)   SpO2 100%   BMI 42.58 kg/m  Wt Readings from Last 3 Encounters:  07/09/20 240 lb 6.4 oz (109 kg)  05/21/20 232 lb (105.2 kg)  04/08/20 228 lb 3.2 oz (103.5 kg)     Health Maintenance Due  Topic Date Due  . PAP  SMEAR-Modifier  07/24/2020    There are no preventive care reminders to display for this patient.  Lab Results  Component Value Date   TSH 1.470 12/13/2019   Lab  Results  Component Value Date   WBC 6.5 04/08/2020   HGB 13.9 04/08/2020   HCT 41.2 04/08/2020   MCV 81 04/08/2020   PLT 219 04/08/2020   Lab Results  Component Value Date   NA 136 07/09/2020   K 4.4 07/09/2020   CO2 26 02/19/2020   GLUCOSE 79 07/09/2020   BUN 12 07/09/2020   CREATININE 0.82 07/09/2020   BILITOT 0.4 07/09/2020   ALKPHOS 73 07/09/2020   AST 13 07/09/2020   ALT 14 09/26/2018   PROT 7.7 07/09/2020   ALBUMIN 4.4 07/09/2020   CALCIUM 9.6 07/09/2020   ANIONGAP 9 02/19/2020   Lab Results  Component Value Date   CHOL 172 07/09/2020   Lab Results  Component Value Date   HDL 37 (L) 07/09/2020   Lab Results  Component Value Date   LDLCALC 123 (H) 07/09/2020   Lab Results  Component Value Date   TRIG 59 07/09/2020   Lab Results  Component Value Date   CHOLHDL 4.6 (H) 07/09/2020   Lab Results  Component Value Date   HGBA1C 5.0 09/26/2018      Assessment & Plan:   Problem List Items Addressed This Visit      Cardiovascular and Mediastinum   Chronic heart failure with preserved ejection fraction (HCC)    Other Visit Diagnoses    Obesity, Class III, BMI 40-49.9 (morbid obesity) (HCC)    -  Primary Obesity with BMI and comorbidities as noted above.  Discussed proper diet (low fat, low sodium, high fiber) with patient.   Discussed need for regular exercise (3 times per week, 20 minutes per session) with patient.    Relevant Medications   Liraglutide -Weight Management (SAXENDA) 18 MG/3ML SOPN   Dulaglutide (TRULICITY) 1.5 MG/0.5ML SOPN   Other Relevant Orders   Ambulatory referral to Physical Therapy   Amb ref to Medical Nutrition Therapy-MNT   Screening for diabetes mellitus       Relevant Orders   Comp. Metabolic Panel (12) (Completed)   Screening for cholesterol level        Relevant Orders   Lipid panel (Completed)      Meds ordered this encounter  Medications  . Liraglutide -Weight Management (SAXENDA) 18 MG/3ML SOPN    Sig: Inject 0.6 mg into the skin daily for 7 days, THEN 1.2 mg daily for 7 days, THEN 1.8 mg daily.    Dispense:  3 mL    Refill:  2    Order Specific Question:   Supervising Provider    Answer:   Quentin Angst [id number]  . Dulaglutide (TRULICITY) 1.5 MG/0.5ML SOPN    Sig: Inject 1.5 mg into the skin once a week.    Dispense:  6 mL    Refill:  0    Order Specific Question:   Supervising Provider    Answer:   Quentin Angst [[id number]]    Follow-up: Return in about 3 months (around 10/09/2020).    Barbette Merino, NP

## 2020-07-10 LAB — COMP. METABOLIC PANEL (12)
AST: 13 IU/L (ref 0–40)
Albumin/Globulin Ratio: 1.3 (ref 1.2–2.2)
Albumin: 4.4 g/dL (ref 3.8–4.8)
Alkaline Phosphatase: 73 IU/L (ref 44–121)
BUN/Creatinine Ratio: 15 (ref 9–23)
BUN: 12 mg/dL (ref 6–24)
Bilirubin Total: 0.4 mg/dL (ref 0.0–1.2)
Calcium: 9.6 mg/dL (ref 8.7–10.2)
Chloride: 99 mmol/L (ref 96–106)
Creatinine, Ser: 0.82 mg/dL (ref 0.57–1.00)
GFR calc Af Amer: 101 mL/min/{1.73_m2} (ref >59)
GFR calc non Af Amer: 88 mL/min/{1.73_m2} (ref >59)
Globulin, Total: 3.3 g/dL (ref 1.5–4.5)
Glucose: 79 mg/dL (ref 65–99)
Potassium: 4.4 mmol/L (ref 3.5–5.2)
Sodium: 136 mmol/L (ref 134–144)
Total Protein: 7.7 g/dL (ref 6.0–8.5)

## 2020-07-10 LAB — LIPID PANEL
Chol/HDL Ratio: 4.6 ratio — ABNORMAL HIGH (ref 0.0–4.4)
Cholesterol, Total: 172 mg/dL (ref 100–199)
HDL: 37 mg/dL — ABNORMAL LOW (ref >39)
LDL Chol Calc (NIH): 123 mg/dL — ABNORMAL HIGH (ref 0–99)
Triglycerides: 59 mg/dL (ref 0–149)
VLDL Cholesterol Cal: 12 mg/dL (ref 5–40)

## 2020-07-12 ENCOUNTER — Encounter: Payer: Self-pay | Admitting: Nurse Practitioner

## 2020-08-03 ENCOUNTER — Ambulatory Visit: Payer: Self-pay | Attending: Nurse Practitioner | Admitting: Physical Therapy

## 2020-08-03 ENCOUNTER — Encounter: Payer: Self-pay | Admitting: Physical Therapy

## 2020-08-03 ENCOUNTER — Other Ambulatory Visit: Payer: Self-pay

## 2020-08-03 VITALS — BP 138/84 | HR 72

## 2020-08-03 DIAGNOSIS — R2689 Other abnormalities of gait and mobility: Secondary | ICD-10-CM | POA: Insufficient documentation

## 2020-08-04 NOTE — Patient Instructions (Signed)
Access Code: [email or web address]   URL: [email or web address]: 12/14/2021Prepared by: Victorino Dike PaaExercises  Supine Posterior Pelvic Tilt - 1 x daily - 7 x weekly - 2 sets - 10 reps - 5 hold  Supine March - 1 x daily - 7 x weekly - 2 sets - 10 reps - 5 hold  Supine 90/90 Alternating Toe Touch - 1 x daily - 7 x weekly - 2 sets - 10 reps - 5 hold  Supine Bridge - 1 x daily - 7 x weekly - 2 sets - 10 reps - 5 hold  Sidelying Hip Abduction - 1 x daily - 7 x weekly - 2 sets - 10 reps - 5 hold  Kneeling Plank with Feet on Ground - 1 x daily - 7 x weekly - 1 sets - 5 reps - 30 hold  Sit to Stand - 1 x daily - 7 x weekly - 2 sets - 10 reps - 5 hold

## 2020-08-04 NOTE — Therapy (Signed)
[facility] [address] [place], [place], [address] Phone: [phone]   Fax:  [phone]  Physical Therapy Evaluation  Patient Details  Name: Lydia Thompson MRN: [id number] Date of Birth: [birth year] Referring Provider (PT): Thad Ranger, NP   Encounter Date: 08/03/2020   PT End of Session - 08/03/20 1838    Visit Number 1    Number of Visits 4    Date for PT Re-Evaluation 09/28/20    Authorization Type self pay    PT Start Time 1530    PT Stop Time 1615    PT Time Calculation (min) 45 min    Activity Tolerance Patient tolerated treatment well    Behavior During Therapy [facility] for tasks assessed/performed           Past Medical History:  Diagnosis Date  . CHF (congestive heart failure) (HCC)   . Hypertension     Past Surgical History:  Procedure Laterality Date  . ABDOMINAL HYSTERECTOMY      Vitals:   08/03/20 1548 08/03/20 1553  BP: 124/84 138/84  Pulse: 69 72  SpO2: 97%       Subjective Assessment - 08/03/20 1532    Subjective Pt here for obesity and would like to be able to lose weight, manage her CHF.  She is interested in learning proper technique and exercises to do at home. She gets out of breath easily, has to take breaks, including housework. She has not been an exerciser in the past.  She has no pain .    Pertinent History CHR, HTN, obesity    Limitations Walking;House hold activities    Patient Stated Goals She would like to be able to go longer in her activity levels.    Currently in Pain? No/denies              [facility] PT Assessment - 08/04/20 0001      Assessment   Medical Diagnosis Obesity    Referring Provider (PT) Thad Ranger, NP    Prior Therapy No      Precautions   Precautions None      Restrictions   Weight Bearing Restrictions No      Balance Screen   Has the patient fallen in the past 6 months No      Home Environment   Living Environment Private residence    Living  Arrangements Spouse/significant other;Children      Prior Function   Level of Independence Independent;Independent with household mobility without device;Independent with community mobility without device      Cognition   Overall Cognitive Status Within Functional Limits for tasks assessed      Observation/Other Assessments   Focus on Therapeutic Outcomes (FOTO)  NT- 1 x visit      Sensation   Light Touch Appears Intact      Posture/Postural Control   Posture/Postural Control No significant limitations      AROM   Overall AROM Comments WFL in UE and LE      Strength   Overall Strength Comments shoulders 4/5, LE 5/5      Transfers   Five time sit to stand comments  13 sec      Ambulation/Gait   Gait Comments 2 min walk test: 418 feet             Objective measurements completed on examination: See above findings.      PT Education - 08/04/20 0940    Education Details PT/POC, HEP, options  for her regarding physical activity that are more cost effective    Person(s) Educated Patient    Methods Explanation;Demonstration;Handout    Comprehension Verbalized understanding;Returned demonstration          Access Code: [email or web address]: [email or web address]: 12/14/2021Prepared by: Victorino Dike PaaExercises  Supine Posterior Pelvic Tilt - 1 x daily - 7 x weekly - 2 sets - 10 reps - 5 hold  Supine March - 1 x daily - 7 x weekly - 2 sets - 10 reps - 5 hold  Supine 90/90 Alternating Toe Touch - 1 x daily - 7 x weekly - 2 sets - 10 reps - 5 hold  Supine Bridge - 1 x daily - 7 x weekly - 2 sets - 10 reps - 5 hold  Sidelying Hip Abduction - 1 x daily - 7 x weekly - 2 sets - 10 reps - 5 hold  Kneeling Plank with Feet on Ground - 1 x daily - 7 x weekly - 1 sets - 5 reps - 30 hold  Sit to Stand - 1 x daily - 7 x weekly - 2 sets - 10 reps - 5 hold   Reviewed and completed x 10 reps of these exercises above.      PT Long Term Goals - 08/04/20 0941      PT LONG TERM  GOAL #1   Title Pt will be I with HEP for core, hips and long term fitness, including cardio/walking    Time 4    Period --   visits   Status New    Target Date 08/28/20      PT LONG TERM GOAL #2   Title Pt will know how to take her pulse, use RPE to self monitor    Time 4    Period --   visits   Status New    Target Date 08/28/20                  Plan - 08/03/20 1839    Clinical Impression Statement Patient presents with diagnosis of obesity in the presence of cardiomyopathy.  She want to [facility] her endurance and improve her exercise capacity.  She had stable vital signs.  LE and UE strength is WNL.  She walked 418 feet in 2 min with "somewhat hard" RPE.  She was given a basic core program.  Due to her lack of insurance it is not feasible for her to come to PT as often as the NP recommends.  We spent time discussing other options for her that are more realistic.  She made an appt in 2 weeks in case she was successful in pursuing [employer] Asst or wants to have another session.    Personal Factors and Comorbidities Comorbidity 2    Comorbidities CHF, HTN    Examination-Activity Limitations Locomotion Level    Examination-Participation Restrictions Cleaning;Shop;Community Activity;Occupation;Interpersonal Relationship    Stability/Clinical Decision Making Stable/Uncomplicated    Clinical Decision Making Low    Rehab Potential Excellent    PT Frequency Biweekly    PT Duration 8 weeks    PT Treatment/Interventions Therapeutic exercise;ADLs/Self Care Home Management    PT Next Visit Plan advance HEP, cardio (machines)    PT Home Exercise Plan core: [email or web address]    Consulted and Agree with Plan of Care Patient           Patient will benefit from skilled therapeutic intervention in order to improve the following deficits and impairments:  Cardiopulmonary status limiting activity,Decreased activity  tolerance,Obesity,Difficulty walking  Visit Diagnosis: Other abnormalities of  gait and mobility     Problem List Patient Active Problem List   Diagnosis Date Noted  . Chronic heart failure with preserved ejection fraction (HCC) 11/08/2019  . H/O TIA (transient ischemic attack) and stroke 05/01/2019  . Hypertrophic cardiomyopathy (HCC) 04/28/2019  . SOB (shortness of breath) 09/26/2017  . Essential hypertension 09/26/2017  . Atypical chest pain 09/26/2017    Amere Iott 08/04/2020, 9:44 AM  [facility] [address] [place], [place], [address] Phone: [phone]   Fax:  [phone]  Name: Lydia Thompson MRN: [id number] Date of Birth: 1975-09-18   Karie Mainland, PT 08/04/20 9:45 AM Phone: [phone] Fax: [phone]

## 2020-08-06 MED FILL — FUROSEMIDE 20 MG TABS: 20 | 90 days supply | Qty: 90 | Fill #1

## 2020-08-11 ENCOUNTER — Encounter: Payer: Self-pay | Admitting: Dietician

## 2020-08-11 ENCOUNTER — Other Ambulatory Visit: Payer: Self-pay

## 2020-08-11 ENCOUNTER — Encounter: Payer: Self-pay | Attending: Nurse Practitioner | Admitting: Dietician

## 2020-08-11 VITALS — Ht 64.0 in | Wt 239.3 lb

## 2020-08-11 DIAGNOSIS — K219 Gastro-esophageal reflux disease without esophagitis: Secondary | ICD-10-CM | POA: Insufficient documentation

## 2020-08-11 MED FILL — DILTIAZEM 24HR ER 180 MG CA: 180 | 90 days supply | Qty: 90 | Fill #2

## 2020-08-11 NOTE — Progress Notes (Signed)
Medical Nutrition Therapy   Primary concerns today: General Nutrition  Referral diagnosis: E66.01 - Obesity, Class III Preferred learning style: Visual Learning readiness: Ready   NUTRITION ASSESSMENT   Anthropometrics  Wt: 239.4 lbs Ht: 5'4" BMI: 41.08 kg/m2  Clinical Medical Hx: HTN, hypertrophic cardiomyopathy Medications: Trulicity, Saxenda, Lasix, Metropolol, Spironolactone   Labs:  HDL - 37 (low) LDL - 123 (high), H. Pylori breath test -Positive Notable Signs/Symptoms: Chest pain, reflux, dry mouth  Lifestyle & Dietary Hx Pt reports taking on stress empathetically from her friends/family. 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 Pt had COVID in August, but has no lasting symptoms.Pt was admitted into the hospital in 2018 with heart failure, they drained over 10 pounds of fluid from her. Pt states that she has had to have fluid removed occasionally. States she knows it needs to be done when her chest feels a certain tightness. Pt reports being thirsty during the night time, drink ~64 oz of water in the evening. Pt reports having trouble enjoying foods without salt, her palette is used to salty foods. Pt states she eats foods she knows she shouldn't, knowing she will feel nauseas after. Pt wants to learn how to eat the right portions. Pt reports feeling nauseas every time she eats fatty, or energy dense foods. No symptoms if she eats salads or something light. Pt states that she wears a waist trainer. Pt visited PT last week, and got new exercises to try. Reports she likes to walk.   Estimated daily fluid intake: ~64 oz Supplements: Vitamin D3 Sleep: sleeps on her right side, 6-7 hours, gets up 3-4 times to pee Stress / self-care: 10/10, Watching her 45 year old step son stresses her out a lot. Pt gets massages for stress relief. Current average weekly physical activity: ADLs   24-Hr Dietary Recall First Meal: none Snack: none Second Meal: Cookout BLT,  fries, arnold palmer Snack: none Third Meal: [employer] quesadilla hamburger, a few fries, blondie with ice cream Snack: ~120 oz water Beverages: Water   NUTRITION DIAGNOSIS  NB-1.1 Food and nutrition-related knowledge deficit As related to GERD.  As evidenced by persistent reflux, chest pain, positive H. Pylori breat test, and dietary recall high in fatty foods..   NUTRITION INTERVENTION  Nutrition education (E-1) on the following topics:  Educated patient on the balanced plate eating model. Recommended lunch and dinner be 1/2 non-starchy vegetables, 1/4 starches, and 1/4 protein. Recommended breakfast be a balance of starch and protein with a piece of fruit. Discussed with patient the importance of working towards hitting the proportions of the balanced plate consistently. Counseled patient on ways to begin recognizing each of the food groups from the balanced plate in their own meals, and how close they are to fitting the recommended proportions of the balanced plate.  Educated patient on the nutritional value of each food group on the balanced plate model. Educate patient on the relationship between sodium, water, and fluid retention. Explain how that relates to hypertension.  Educate pt on symptoms of GERD, and foods/factors that may intensify those symptoms. Educate pt on the significance of positive H. Pylori breath test.   Handouts Provided Include   GERD Nutrition Therapy Nutrition Care Manual  Low- Sodium Nutrition Therapy Nutrition Care Manual  Balanced Plate  Learning Style & Readiness for Change Teaching method utilized: Visual & Auditory  Demonstrated degree of understanding via: Teach Back  Barriers to learning/adherence to lifestyle change: None  Goals  Established by Pt  Work towards eating three meals a day.  Use sugar-free mints or gum to help with dry mouth.  Begin to recognize food groups from the balanced plate.  View your food sources as starches, proteins,  ans vegetables  Begin to recognize sodium sources in your food choices, and considered reduced sodium options or alternative food choices.  Try sleeping on your left side, and not laying down within 2-3 hours of eating.  Aim for about 80-100 oz of water a day.   MONITORING & EVALUATION Dietary intake, weekly physical activity, and GERD symptoms in 1 month.  Next Steps  Patient is to work to identify dietary sources high in sodium, foods that trigger GERD, and follow up with dietitian.

## 2020-08-11 NOTE — Patient Instructions (Addendum)
Work towards eating three meals a day.  Use sugar-free mints or gum to help with dry mouth.  Begin to recognize food groups from the balanced plate. View your food sources as starches, proteins, ans vegetables  Begin to recognize sodium sources in your food choices, and considered reduced sodium options or alternative food choices.  Try sleeping on your left side, and not laying down within 2-3 hours of eating.  Aim for about 80-100 oz of water a day.

## 2020-08-21 ENCOUNTER — Other Ambulatory Visit: Payer: Self-pay | Admitting: Cardiology

## 2020-08-21 DIAGNOSIS — I5189 Other ill-defined heart diseases: Secondary | ICD-10-CM

## 2020-08-21 DIAGNOSIS — I422 Other hypertrophic cardiomyopathy: Secondary | ICD-10-CM

## 2020-08-21 MED FILL — METOPROLOL SUCCINATE ER 50: 50 | 30 days supply | Qty: 30 | Fill #1

## 2020-08-24 ENCOUNTER — Other Ambulatory Visit: Payer: Self-pay | Admitting: Cardiology

## 2020-08-24 ENCOUNTER — Other Ambulatory Visit: Payer: Self-pay

## 2020-08-24 DIAGNOSIS — I422 Other hypertrophic cardiomyopathy: Secondary | ICD-10-CM

## 2020-08-24 DIAGNOSIS — I10 Essential (primary) hypertension: Secondary | ICD-10-CM

## 2020-08-24 MED ORDER — FUROSEMIDE 20 MG PO TABS: 20.0000 mg | ORAL_TABLET | Freq: Every day | ORAL | 3 refills | Status: DC | PRN

## 2020-08-24 MED FILL — SPIRONOLACTONE 25 MG TABLET: 25 | 30 days supply | Qty: 30 | Fill #0

## 2020-08-25 ENCOUNTER — Ambulatory Visit: Payer: Self-pay | Admitting: Physical Therapy

## 2020-09-15 ENCOUNTER — Encounter: Payer: Self-pay | Admitting: Dietician

## 2020-09-18 ENCOUNTER — Telehealth: Payer: Self-pay

## 2020-09-18 ENCOUNTER — Encounter: Payer: Self-pay | Admitting: Cardiology

## 2020-09-18 ENCOUNTER — Ambulatory Visit: Payer: Self-pay | Admitting: Cardiology

## 2020-09-18 ENCOUNTER — Other Ambulatory Visit: Payer: Self-pay | Admitting: Cardiology

## 2020-09-18 ENCOUNTER — Other Ambulatory Visit: Payer: Self-pay

## 2020-09-18 VITALS — BP 136/82 | HR 96 | Temp 97.7°F | Resp 16 | Ht 64.0 in | Wt 238.0 lb

## 2020-09-18 DIAGNOSIS — I10 Essential (primary) hypertension: Secondary | ICD-10-CM

## 2020-09-18 DIAGNOSIS — R079 Chest pain, unspecified: Secondary | ICD-10-CM

## 2020-09-18 DIAGNOSIS — I422 Other hypertrophic cardiomyopathy: Secondary | ICD-10-CM

## 2020-09-18 MED ORDER — ISOSORBIDE MONONITRATE ER 30 MG PO TB24: 30.0000 mg | ORAL_TABLET | Freq: Every day | ORAL | 3 refills | Status: DC

## 2020-09-18 MED FILL — ISOSORBIDE MN ER 30 MG TAB: 30 | 30 days supply | Qty: 30 | Fill #0

## 2020-09-18 NOTE — Progress Notes (Signed)
Patient is here for follow up visit.  Subjective:   Lydia Thompson, female    DOB: 04/13/1976, 45 y.o.   MRN: [id number]   Chief Complaint  Patient presents with  . Chest Pain     Chest Pain  Pertinent negatives include no palpitations or syncope.    45 year old [place] American female with hypertrophic cardiomyopathy, history of SVT, TIA, h/o COVID  Patient has had retrosternal constant chest pain for last 3 days. Pain is worse after walking, as well as eating. She also has exertional dyspnea. She is compliant with her medical therapy. She denies presyncope or syncope. She denie fever, chills, cough, recent COVID contacts. She remains unvaccinated.    Current Outpatient Medications on File Prior to Visit  Medication Sig Dispense Refill  . aspirin EC 81 MG tablet Take 1 tablet (81 mg total) by mouth daily. 90 tablet 3  . Cholecalciferol (VITAMIN D3) 50 MCG (2000 UT) CHEW Chew 1 mcg by mouth daily.    Marland Kitchen conjugated estrogens (PREMARIN) vaginal cream Place 1 Applicatorful vaginally daily. 42.5 g 12  . diltiazem (CARDIZEM CD) 180 MG 24 hr capsule Take 1 capsule (180 mg total) by mouth daily. 90 capsule 3  . furosemide (LASIX) 20 MG tablet Take 1 tablet (20 mg total) by mouth daily as needed for fluid or edema. 60 tablet 3  . metoprolol succinate (TOPROL-XL) 50 MG 24 hr tablet Take 1 tablet (50 mg total) by mouth daily. Take with or immediately following a meal. 90 tablet 3  . spironolactone (ALDACTONE) 25 MG tablet Take 25 mg by mouth daily.     No current facility-administered medications on file prior to visit.    Cardiovascular studies:  EKG 09/18/2020: Sinus rhythm 86 bpm  Biatrial enlargement Left ventricular hypertrophy Nonspecific ST depression, likely due to LVH No change compared to previous EKG's  Echocardiogram 05/12/2020:  Left ventricle cavity is normal in size. Severe concentric hypertrophy of  the left ventricle. Normal global wall motion. Normal LV systolic  function  with EF 56%. Indeterminate diastolic filling pattern.  Left atrial cavity is mildly dilated.  Posteriorly directed, moderate (Grade II) mitral regurgitation.  Structurally normal trileaflet aortic valve. Resting LVOT max gradient 18  mmHg. No significant valvular stenosis. No regurgitation.  Mild tricuspid regurgitation.  No evidence of pulmonary hypertension.  No significant change compared to previous study on 02/01/2019.  EKG 11/08/2019: Sinus rhythm 52 bpm. Incomplete left bundle branch block.  Left atrial enlargement.   Echocardiogram 02/01/2019: Normal LV systolic function with EF 56%. Left ventricle cavity is normal in size. Severe asymmetric hypertrophy of the left ventricle. Resting LVOT max gradient 12 mmHg.  Normal global wall motion. Indeterminate diastolic filling pattern.  Left atrial cavity is severely dilated. Trileaflet aortic valve. No significant valvular stenosis. Trace aortic regurgitation. Moderate (Grade III) mitral regurgitation. Mild tricuspid regurgitation. Estimated pulmonary artery systolic pressure 24 mmHg. No significant change compared to previous study on 01/07/2018.  Coronary CT angiogram 10/17/2018: 1. Coronary calcium score of 0. This was 0 percentile for age and sex matched control. 2. Normal coronary origin with right dominance. 3. No evidence of CAD. 4. Moderately dilated pulmonary artery measuring 36 mm suggestive of pulmonary hypertension.   Exercise Treadmill Stress Test 01/12/2018:  Indication: SoB  The patient exercised on Bruce protocol for 4:00 min. Patient achieved  5.84 METS and reached HR  154 bpm, which is   44% of maximum age-predicted HR.  Stress test terminated due to 7/10 CP  and Fatigue.  Exercise capacity was below average for age.  HR Response to Exercise: Exaggerated HR response. BP Response to Exercise: Normal resting BP- appropriate response. Chest Pain: limiting. Arrhythmias: none. ST Changes:  Rest: Sinus  rhythm 90 bpm. 1-2 mm T wave inversions leads II, III, aVF Stress: Sinus tachycardia. >2 mm T wave inversions leads II, III, aVF, V5,V 6. <1 mm ST elevation lead aVR. ST changes persist 2 min into recovery.   Overall Impression: Positive stress test typical of diffuse subendocardial ischemia. Consider further cardiac workup.  Recent labs: 07/09/2020: Glucose 79, BUN/Cr 12/0.82. EGFR 101. Na/K 136/4.4. Rest of the CMP normal Chol 172, TG 59, HDL 37, LDL 123  03/2020: H/H 13/41. MCV 81. Platelets 219  11/2019: TSH 1.4  Review of Systems  Cardiovascular: Positive for chest pain. Negative for dyspnea on exertion, leg swelling, palpitations and syncope.       Objective:    Vitals:   09/18/20 1057  BP: 136/82  Pulse: 96  Resp: 16  Temp: 97.7 F (36.5 C)  SpO2: 98%   Filed Weights   09/18/20 1057  Weight: 238 lb (108 kg)      Physical Exam Vitals and nursing note reviewed.  Constitutional:      General: She is not in acute distress. Neck:     Vascular: No JVD.  Cardiovascular:     Rate and Rhythm: Normal rate and regular rhythm.     Pulses: Intact distal pulses.     Heart sounds: Murmur (II/VI ESM RUSB) heard.    Pulmonary:     Effort: Pulmonary effort is normal.     Breath sounds: Normal breath sounds. No wheezing or rales.  Abdominal:     Tenderness: There is no rebound.         Assessment & Recommendations:    45 year old [place] American female with hypertrophic cardiomyopathy, history of SVT, TIA, h/o COVID  Chest pain: Atypical. EKG unchanged compared to baseline.  I suspect this could be related to URI or underlying ischemia in the setting of hypertrophic cardiomyopathy.  She has normal coronaries on CCTA 09/2018. Started Imdur 30 mg. In addition, also monitor for any COVID symptoms and consider getting tested, if symptoms do not improve.   Hypertrophic cardiomyopathy (HCC) Septal thickness 2.1 cm. No indication for ICD. Continue metoprolol and  diltiazem. Continue metoprolol succinate 50 mg daily  Chronic heart failure with preserved ejection fraction (HCC) Due to #2. Currently stable.  Tolerating low dose spironolactone. Recommend daily weights. Discussed importance of low salt diet.   H/o TIA: No recurrence. Continue Aspirin 81 mg daily.  F/u in 4 weeks  [place], MD [facility] Pager: [phone] Office: [phone] If no answer Cell [phone]

## 2020-09-18 NOTE — Telephone Encounter (Signed)
I can see her this morning

## 2020-09-18 NOTE — Telephone Encounter (Signed)
Pt called stating she has had chest pain since Tuesday. She said she feels like someone is screwing something into her heart, and she feels more pressure in her heart than normal. She can feel her heart beating abnormal. She is gaining weight so she took an extra fluid pill. Every time she walks she can feel her heart beat really fast. Her PCP doesn't have an appointment until feb 17. She takes her medicine every morning. She said it hurts when she laid down. Pt wants to know if she should come in.

## 2020-09-24 ENCOUNTER — Ambulatory Visit: Payer: Self-pay | Admitting: Dietician

## 2020-10-07 MED FILL — METOPROLOL SUCCINATE ER 50: 50 | 30 days supply | Qty: 30 | Fill #2

## 2020-10-07 MED FILL — SPIRONOLACTONE 25 MG TABLET: 25 | 30 days supply | Qty: 30 | Fill #1

## 2020-10-08 ENCOUNTER — Encounter: Payer: Self-pay | Admitting: Nurse Practitioner

## 2020-10-08 ENCOUNTER — Ambulatory Visit (INDEPENDENT_AMBULATORY_CARE_PROVIDER_SITE_OTHER): Payer: Self-pay | Admitting: Nurse Practitioner

## 2020-10-08 ENCOUNTER — Other Ambulatory Visit: Payer: Self-pay

## 2020-10-08 VITALS — BP 102/74 | HR 72 | Temp 97.5°F | Ht 64.0 in | Wt 236.6 lb

## 2020-10-08 DIAGNOSIS — Z6841 Body Mass Index (BMI) 40.0 and over, adult: Secondary | ICD-10-CM

## 2020-10-08 DIAGNOSIS — E66813 Obesity, class 3: Secondary | ICD-10-CM

## 2020-10-08 DIAGNOSIS — T148XXA Other injury of unspecified body region, initial encounter: Secondary | ICD-10-CM

## 2020-10-08 DIAGNOSIS — Z13220 Encounter for screening for lipoid disorders: Secondary | ICD-10-CM

## 2020-10-08 DIAGNOSIS — F419 Anxiety disorder, unspecified: Secondary | ICD-10-CM

## 2020-10-08 DIAGNOSIS — I5032 Chronic diastolic (congestive) heart failure: Secondary | ICD-10-CM

## 2020-10-08 DIAGNOSIS — Z131 Encounter for screening for diabetes mellitus: Secondary | ICD-10-CM

## 2020-10-08 DIAGNOSIS — I10 Essential (primary) hypertension: Secondary | ICD-10-CM

## 2020-10-08 LAB — POCT URINALYSIS DIP (CLINITEK)
Bilirubin, UA: NEGATIVE
Blood, UA: NEGATIVE
Glucose, UA: NEGATIVE mg/dL
Ketones, POC UA: NEGATIVE mg/dL
Leukocytes, UA: NEGATIVE
Nitrite, UA: NEGATIVE
POC PROTEIN,UA: NEGATIVE
Spec Grav, UA: 1.015 (ref 1.010–1.025)
Urobilinogen, UA: 0.2 E.U./dL
pH, UA: 5.5 (ref 5.0–8.0)

## 2020-10-08 MED ORDER — SAXENDA 18 MG/3ML ~~LOC~~ SOPN
PEN_INJECTOR | SUBCUTANEOUS | 0 refills | Status: DC
Start: 2020-10-08 — End: 2021-08-06

## 2020-10-08 NOTE — Patient Instructions (Signed)
https://www.womenshealth.gov/menopause/menopause-basics"> https://www.clinicalkey.com">  Menopause Menopause is the normal time of a woman's life when menstrual periods stop completely. It marks the natural end to a woman's ability to become pregnant. It can be defined as the absence of a menstrual period for 12 months without another medical cause. The transition to menopause (perimenopause) most often happens between the ages of 45 and 55, and can last for many years. During perimenopause, hormone levels change in your body, which can cause symptoms and affect your health. Menopause may increase your risk for:  Weakened bones (osteoporosis), which causes fractures.  Depression.  Hardening and narrowing of the arteries (atherosclerosis), which can cause heart attacks and strokes. What are the causes? This condition is usually caused by a natural change in hormone levels that happens as you get older. The condition may also be caused by changes that are not natural, including:  Surgery to remove both ovaries (surgical menopause).  Side effects from some medicines, such as chemotherapy used to treat cancer (chemical menopause). What increases the risk? This condition is more likely to start at an earlier age if you have certain medical conditions or have undergone treatments, including:  A tumor of the pituitary gland in the brain.  A disease that affects the ovaries and hormones.  Certain cancer treatments, such as chemotherapy or hormone therapy, or radiation therapy on the pelvis.  Heavy smoking and excessive alcohol use.  Family history of early menopause. This condition is also more likely to develop earlier in women who are very thin. What are the signs or symptoms? Symptoms of this condition include:  Hot flashes.  Irregular menstrual periods.  Night sweats.  Changes in feelings about sex. This could be a decrease in sex drive or an increased discomfort around your  sexuality.  Vaginal dryness and thinning of the vaginal walls. This may cause painful sex.  Dryness of the skin and development of wrinkles.  Headaches.  Problems sleeping (insomnia).  Mood swings or irritability.  Memory problems.  Weight gain.  Hair growth on the face and chest.  Bladder infections or problems with urinating. How is this diagnosed? This condition is diagnosed based on your medical history, a physical exam, your age, your menstrual history, and your symptoms. Hormone tests may also be done. How is this treated? In some cases, no treatment is needed. You and your health care provider should make a decision together about whether treatment is necessary. Treatment will be based on your individual condition and preferences. Treatment for this condition focuses on managing symptoms. Treatment may include:  Menopausal hormone therapy (MHT).  Medicines to treat specific symptoms or complications.  Acupuncture.  Vitamin or herbal supplements. Before starting treatment, make sure to let your health care provider know if you have a personal or family history of these conditions:  Heart disease.  Breast cancer.  Blood clots.  Diabetes.  Osteoporosis. Follow these instructions at home: Lifestyle  Do not use any products that contain nicotine or tobacco, such as cigarettes, e-cigarettes, and chewing tobacco. If you need help quitting, ask your health care provider.  Get at least 30 minutes of physical activity on 5 or more days each week.  Avoid alcoholic and caffeinated beverages, as well as spicy foods. This may help prevent hot flashes.  Get 7-8 hours of sleep each night.  If you have hot flashes, try: ? Dressing in layers. ? Avoiding things that may trigger hot flashes, such as spicy food, warm places, or stress. ? Taking slow, deep   breaths when a hot flash starts. ? Keeping a fan in your home and office.  Find ways to manage stress, such as deep  breathing, meditation, or journaling.  Consider going to group therapy with other women who are having menopause symptoms. Ask your health care provider about recommended group therapy meetings. Eating and drinking  Eat a healthy, balanced diet that contains whole grains, lean protein, low-fat dairy, and plenty of fruits and vegetables.  Your health care provider may recommend adding more soy to your diet. Foods that contain soy include tofu, tempeh, and soy milk.  Eat plenty of foods that contain calcium and vitamin D for bone health. Items that are rich in calcium include low-fat milk, yogurt, beans, almonds, sardines, broccoli, and kale.   Medicines  Take over-the-counter and prescription medicines only as told by your health care provider.  Talk with your health care provider before starting any herbal supplements. If prescribed, take vitamins and supplements as told by your health care provider. General instructions  Keep track of your menstrual periods, including: ? When they occur. ? How heavy they are and how long they last. ? How much time passes between periods.  Keep track of your symptoms, noting when they start, how often you have them, and how long they last.  Use vaginal lubricants or moisturizers to help with vaginal dryness and improve comfort during sex.  Keep all follow-up visits. This is important. This includes any group therapy or counseling.   Contact a health care provider if:  You are still having menstrual periods after age 44.  You have pain during sex.  You have not had a period for 12 months and you develop vaginal bleeding. Get help right away if you have:  Severe depression.  Excessive vaginal bleeding.  Pain when you urinate.  A fast or irregular heartbeat (palpitations).  Severe headaches.  Abdominal pain or severe indigestion. Summary  Menopause is a normal time of life when menstrual periods stop completely. It is usually defined as  the absence of a menstrual period for 12 months without another medical cause.  The transition to menopause (perimenopause) most often happens between the ages of 1445 and 7755 and can last for several years.  Symptoms can be managed through medicines, lifestyle changes, and complementary therapies such as acupuncture.  Eat a balanced diet that is rich in nutrients to promote bone health and heart health and to manage symptoms during menopause. This information is not intended to replace advice given to you by your health care provider. Make sure you discuss any questions you have with your health care provider. Document Revised: 05/08/2020 Document Reviewed: 01/23/2020 Elsevier Patient Education  2021 [employer].   ElmiraBlack Cohosh, Cimicifuga racemosa oral dosage forms What is this medicine? BLACK COHOSH (blak KOH hosh) or Cimicifuga racemosa is a dietary supplement. It is promoted to relieve symptoms of menopause, such as hot flashes. The FDA has not approved this supplement for any medical use. This supplement may be used for other purposes; ask your health care provider or pharmacist if you have questions. This medicine may be used for other purposes; ask your health care provider or pharmacist if you have questions. What should I tell my health care provider before I take this medicine? They need to know if you have any of these conditions:  breast cancer  cervical, ovarian or uterine cancer  high blood pressure  infertility  liver disease  menstrual changes or irregular periods  unusual vaginal or  uterine bleeding  an unusual or allergic reaction to black cohosh, soybeans, tartrazine dye (yellow dye number 5), other medicines, foods, dyes, or preservatives  pregnant or trying to get pregnant  breast-feeding How should I use this medicine? Take this herb by mouth with a glass of water. Follow the directions on the package labeling, or talk to your health care professional. Do  not use for longer than 6 months without the advice of a health care professional. Do not use if you are pregnant or breast-feeding. Talk to your obstetrician-gynecologist or certified nurse-midwife. This herb is not for use in children under the age of 18 years. Overdosage: If you think you have taken too much of this medicine contact a poison control center or emergency room at once. NOTE: This medicine is only for you. Do not share this medicine with others. What if I miss a dose? If you miss a dose, take it as soon as you can. If it is almost time for your next dose, take only that dose. Do not take double or extra doses. What may interact with this medicine?  atorvastatin  cisplatin  fertility treatments This list may not describe all possible interactions. Give your health care provider a list of all the medicines, herbs, non-prescription drugs, or dietary supplements you use. Also tell them if you smoke, drink alcohol, or use illegal drugs. Some items may interact with your medicine. What should I watch for while using this medicine? Since this herb is derived from a plant, allergic reactions are possible. Stop using this herb if you develop a rash. You may need to see your health care professional, or inform them that this occurred. Report any unusual side effects promptly. If you are taking this herb for menstrual or menopausal symptoms, visit your doctor or health care professional for regular checks on your progress. You should have a complete check-up every 6 months. You will need a regular breast and pelvic exam while on this therapy. Follow the advice of your doctor or health care professional. Women should inform their doctor if they wish to become pregnant or think they might be pregnant. If you have any reason to think you are pregnant, stop taking this herb at once and contact your doctor or health care professional. Herbal or dietary supplements are not regulated like medicines.  Rigid quality control standards are not required for dietary supplements. The purity and strength of these products can vary. The safety and effect of this dietary supplement for a certain disease or illness is not well known. This product is not intended to diagnose, treat, cure or prevent any disease. The Food and Drug Administration suggests the following to help consumers protect themselves:  Always read product labels and follow directions.  Natural does not mean a product is safe for humans to take.  Look for products that include USP after the ingredient name. This means that the manufacturer followed the standards of the [place] Pharmacopoeia.  Supplements made or sold by a nationally known food or drug company are more likely to be made under tight controls. You can write to the company for more information about how the product was made. What side effects may I notice from receiving this medicine? Side effects that you should report to your doctor or health care professional as soon as possible:  allergic reactions like skin rash, itching or hives, swelling of the face, lips, or tongue  breathing problems  dizziness  palpitations  signs and symptoms of  liver injury like dark yellow or brown urine; general ill feeling or flu-like symptoms; light-colored stools; loss of appetite; nausea; right upper belly pain; unusually weak or tired; yellowing of the eyes or skin  unusual vaginal bleeding Side effects that usually do not require medical attention (report to your doctor or health care professional if they continue or are bothersome):  breast tenderness  headache  nausea  upset stomach This list may not describe all possible side effects. Call your doctor for medical advice about side effects. You may report side effects to FDA at 1-800-FDA-1088. Where should I keep my medicine? Keep out of the reach of children. Store at room temperature between 15 and 30 degrees C (59 and  86 degrees C). Throw away any unused herb after the expiration date. NOTE: This sheet is a summary. It may not cover all possible information. If you have questions about this medicine, talk to your doctor, pharmacist, or health care provider.  2021 Elsevier/Gold Standard (2016-02-17 14:35:09)  Soy Isoflavones oral dosage forms What is this medicine? SOY ISOFLAVONES (soi iso FLA vons) is an herbal product or dietary supplement. It is promoted to help support hormone balance and to relieve some symptoms of menopause. The FDA has not approved this herb for any medical use. This supplement may be used for other purposes; ask your health care provider or pharmacist if you have questions. This medicine may be used for other purposes; ask your health care provider or pharmacist if you have questions. COMMON BRAND NAME(S): IsoRel What should I tell my health care provider before I take this medicine? They need to know if you have any of these conditions:  cancer  kidney disease  liver disease  taking hormone therapy or told to not take hormone therapy  thyroid disease  an unusual or allergic reaction to soy, soybeans, peanuts or other legumes, other herbs or plants, other medicines, foods, dyes, or preservatives  pregnant or trying to get pregnant  breast-feeding How should I use this medicine? Take by mouth with a glass of water. Follow the directions on the package labeling or ask your health care professional. Take with food or meals. Do not take this supplement more often than directed. Talk to your pediatrician regarding the use of this supplement in children. Special care may be needed. Overdosage: If you think you have taken too much of this medicine contact a poison control center or emergency room at once. NOTE: This medicine is only for you. Do not share this medicine with others. What if I miss a dose? If you miss a dose, take it as soon as you can. If it is almost time for  your next dose, take only that dose. Do not take double or extra doses. What may interact with this medicine? Check with your doctor or healthcare professional if you are taking any of the following medications:  hormone therapy like birth control, fertility treatments, or hormone replacement medicines  medicines for cancer  other supplements like red clover This list may not describe all possible interactions. Give your health care provider a list of all the medicines, herbs, non-prescription drugs, or dietary supplements you use. Also tell them if you smoke, drink alcohol, or use illegal drugs. Some items may interact with your medicine. What should I watch for while using this medicine? Tell your doctor or healthcare professional if your symptoms do not start to get better or if they get worse. Follow a healthy diet. Taking a vitamin supplement  does not replace the need for a balanced diet. Some foods that have soy are soybeans, tofu, tempeh, miso, soy milk, and soy cheese. If you are scheduled for any medical or dental procedure, tell your healthcare provider that you are taking this supplement. You may need to stop taking this supplement before the procedure. Herbal or dietary supplements are not regulated like medicines. Rigid quality control standards are not required for dietary supplements. The purity and strength of these products can vary. The safety and effect of this dietary supplement for a certain disease or illness is not well known. This product is not intended to diagnose, treat, cure or prevent any disease. The Food and Drug Administration suggests the following to help consumers protect themselves:  Always read product labels and follow directions.  Natural does not mean a product is safe for humans to take.  Look for products that include USP after the ingredient name. This means that the manufacturer followed the standards of the [place] Pharmacopoeia.  Supplements made or  sold by a nationally known food or drug company are more likely to be made under tight controls. You can write to the company for more information about how the product was made. What side effects may I notice from receiving this medicine? Side effects that you should report to your doctor or health care professional as soon as possible:  allergic reactions like skin rash, itching or hives, swelling of the face, lips, or tongue  breathing problems Side effects that usually do not require medical attention (report to your doctor or health care professional if they continue or are bothersome):  constipation or diarrhea  gas  nausea  stomach upset This list may not describe all possible side effects. Call your doctor for medical advice about side effects. You may report side effects to FDA at 1-800-FDA-1088. Where should I keep my medicine? Keep out of the reach of children. Store at room temperature or as directed on the package label. Protect from moisture. Throw away any unused supplement after the expiration date. NOTE: This sheet is a summary. It may not cover all possible information. If you have questions about this medicine, talk to your doctor, pharmacist, or health care provider.  2021 Elsevier/Gold Standard (2008-04-10 16:22:49)

## 2020-10-08 NOTE — Progress Notes (Signed)
[facility] [address] [place], [place]  [address] Phone:  [phone]   Fax:  [phone]    Established Patient Office Visit  Subjective:  Patient ID: Lydia Thompson, female    DOB: 1976/05/05  Age: 45 y.o. MRN: [id number]  CC:  Chief Complaint  Patient presents with  . Follow-up    3 month follow up htn , had echo , given a new medication from cardiologist , still hasn't help, she has an appt on Monday to follow up , still having  anxiety     HPI CYTHIA BACHTEL presents for follow up. She  has a past medical history of CHF (congestive heart failure) (HCC) and Hypertension.   She is having chest pain, palpitations, worsening shortness of breath and fatigue. She admits that this have been going on for several weeks and it is getting worse. She admits that she was seen by cardiology, had an ECHO and was stated on Isosorbide 30 mg daily. The ECHO was normal per the patient. She was told that it maybe related to her anxiety. She has had some increased stress however does not feels like it has been this severe. She admits that she is not tolerating this that all it is causing her headaches. She anticipated this at first however she can not tolerate the headaches. She admits that she has stopped taking the Isosorbide. She has a follow up with cardiology on Monday. She has not had a recent stress test.   She admits that she did have COVID-19 again however she did not have any complications. She has chosen not to be vaccinated. She does have a school age stepchild in the home. She admits that she did got out of town to an amusement park and went on a child's ride that she did not tolerate this well.   She has also noted swelling in her left forearm. She denies any pain. She admits that she does have occasional incidents of hitting her extremities. She does tend ot bruise easy; left wrist and leg.   She is still trying loss weight. She has found an Liraglutide that she can  get at a reasonable price. She continues with lifestyle modifications and has lost 2 pounds. She has noticed more weight lose.  Past Medical History:  Diagnosis Date  . CHF (congestive heart failure) (HCC)   . Hypertension     Past Surgical History:  Procedure Laterality Date  . ABDOMINAL HYSTERECTOMY      Family History  Problem Relation Age of Onset  . Hypertension Mother   . Heart disease Paternal Uncle     Social History   Socioeconomic History  . Marital status: Single    Spouse name: Not on file  . Number of children: 0  . Years of education: Not on file  . Highest education level: Not on file  Occupational History  . Not on file  Tobacco Use  . Smoking status: Never Smoker  . Smokeless tobacco: Never Used  Vaping Use  . Vaping Use: Never used  Substance and Sexual Activity  . Alcohol use: Yes    Alcohol/week: 0.0 standard drinks    Comment: social  . Drug use: No  . Sexual activity: Not Currently  Other Topics Concern  . Not on file  Social History Narrative  . Not on file   Social Determinants of Health   Financial Resource Strain: Not on file  Food Insecurity: Not on file  Transportation Needs: Not on file  Physical Activity: Not on file  Stress: Not on file  Social Connections: Not on file  Intimate Partner Violence: Not on file    Outpatient Medications Prior to Visit  Medication Sig Dispense Refill  . aspirin EC 81 MG tablet Take 1 tablet (81 mg total) by mouth daily. 90 tablet 3  . Cholecalciferol (VITAMIN D3) 50 MCG (2000 UT) CHEW Chew 1 mcg by mouth daily.    Marland Kitchen conjugated estrogens (PREMARIN) vaginal cream Place 1 Applicatorful vaginally daily. 42.5 g 12  . diltiazem (CARDIZEM CD) 180 MG 24 hr capsule Take 1 capsule (180 mg total) by mouth daily. 90 capsule 3  . furosemide (LASIX) 20 MG tablet Take 1 tablet (20 mg total) by mouth daily as needed for fluid or edema. 60 tablet 3  . isosorbide mononitrate (IMDUR) 30 MG 24 hr tablet Take 1  tablet (30 mg total) by mouth daily. 30 tablet 3  . metoprolol succinate (TOPROL-XL) 50 MG 24 hr tablet Take 1 tablet (50 mg total) by mouth daily. Take with or immediately following a meal. 90 tablet 3  . spironolactone (ALDACTONE) 25 MG tablet Take 25 mg by mouth daily.     No facility-administered medications prior to visit.    No Known Allergies  ROS Review of Systems  Constitutional: Positive for fatigue.       She has been walking and trying to change her diet She has some home exercises  Respiratory: Positive for shortness of breath.   Cardiovascular: Positive for chest pain and palpitations.       This is a new change with her normal with the SOB   Psychiatric/Behavioral:       Little stress      Objective:    Physical Exam Constitutional:      General: She is not in acute distress.    Appearance: She is obese. She is not ill-appearing, toxic-appearing or diaphoretic.  HENT:     Head: Normocephalic and atraumatic.     Nose: Nose normal.     Mouth/Throat:     Mouth: Mucous membranes are moist.  Cardiovascular:     Rate and Rhythm: Normal rate and regular rhythm.     Pulses: Normal pulses.     Heart sounds: Normal heart sounds.  Pulmonary:     Effort: Pulmonary effort is normal.     Breath sounds: Normal breath sounds.  Abdominal:     Palpations: Abdomen is soft.  Musculoskeletal:        General: Swelling (left forearm) present.     Cervical back: Normal range of motion.     Right lower leg: No edema.     Left lower leg: No edema.  Skin:    General: Skin is warm and dry.     Capillary Refill: Capillary refill takes less than 2 seconds.  Neurological:     General: No focal deficit present.     Mental Status: She is alert and oriented to person, place, and time.  Psychiatric:        Mood and Affect: Mood normal.        Behavior: Behavior normal.        Thought Content: Thought content normal.        Judgment: Judgment normal.     BP 102/74 (BP  Location: Right Arm, Patient Position: Sitting, Cuff Size: Normal)   Pulse 72   Temp (!) 97.5 F (36.4 C) (Temporal)   Ht 5\' 4"  (  1.626 m)   Wt 236 lb 9.6 oz (107.3 kg)   BMI 40.61 kg/m  Wt Readings from Last 3 Encounters:  10/08/20 236 lb 9.6 oz (107.3 kg)  09/18/20 238 lb (108 kg)  08/11/20 239 lb 4.8 oz (108.5 kg)     There are no preventive care reminders to display for this patient.  There are no preventive care reminders to display for this patient.  Lab Results  Component Value Date   TSH 1.470 12/13/2019   Lab Results  Component Value Date   WBC 6.5 04/08/2020   HGB 13.9 04/08/2020   HCT 41.2 04/08/2020   MCV 81 04/08/2020   PLT 219 04/08/2020   Lab Results  Component Value Date   NA 136 07/09/2020   K 4.4 07/09/2020   CO2 26 02/19/2020   GLUCOSE 79 07/09/2020   BUN 12 07/09/2020   CREATININE 0.82 07/09/2020   BILITOT 0.4 07/09/2020   ALKPHOS 73 07/09/2020   AST 13 07/09/2020   ALT 14 09/26/2018   PROT 7.7 07/09/2020   ALBUMIN 4.4 07/09/2020   CALCIUM 9.6 07/09/2020   ANIONGAP 9 02/19/2020   Lab Results  Component Value Date   CHOL 172 07/09/2020   Lab Results  Component Value Date   HDL 37 (L) 07/09/2020   Lab Results  Component Value Date   LDLCALC 123 (H) 07/09/2020   Lab Results  Component Value Date   TRIG 59 07/09/2020   Lab Results  Component Value Date   CHOLHDL 4.6 (H) 07/09/2020   Lab Results  Component Value Date   HGBA1C 5.0 09/26/2018      Assessment & Plan:   Problem List Items Addressed This Visit      Cardiovascular and Mediastinum   Chronic heart failure with preserved ejection fraction (HCC)  Persistent follow up with cardiology as scheduled for further evaluation and additional treatment options   Essential hypertension Stable on current regimen. Continue to monitor at home    Relevant Orders   Comp. Metabolic Panel (12)     Other   Obesity, Class III, BMI 40-49.9 (morbid obesity) (HCC)   Relevant  Medications   Liraglutide -Weight Management (SAXENDA) 18 MG/3ML SOPN    Other Visit Diagnoses    Screening for cholesterol level       Relevant Orders   Lipid panel   Bruising     Evaluation   Relevant Orders   CBC with Differential/Platelet   Screening for diabetes mellitus (DM)       Relevant Orders   POCT URINALYSIS DIP (CLINITEK) (Completed)   Anxiety  Stable on no current treatment will continue to monitor if worsening will continue other treatment options      Meds ordered this encounter  Medications  . Liraglutide -Weight Management (SAXENDA) 18 MG/3ML SOPN    Sig: Inject 0.6 mg into the skin daily for 7 days, THEN 1.2 mg daily for 7 days, THEN 1.8 mg daily.    Dispense:  11.1 mL    Refill:  0    Order Specific Question:   Supervising Provider    Answer:   Quentin Angst [id number]    Follow-up: Return in about 3 months (around 01/05/2021) for Follow up HTN [id number].    Barbette Merino, NP

## 2020-10-09 LAB — COMP. METABOLIC PANEL (12)
AST: 15 IU/L (ref 0–40)
Albumin/Globulin Ratio: 1.5 (ref 1.2–2.2)
Albumin: 4.4 g/dL (ref 3.8–4.8)
Alkaline Phosphatase: 65 IU/L (ref 44–121)
BUN/Creatinine Ratio: 14 (ref 9–23)
BUN: 12 mg/dL (ref 6–24)
Bilirubin Total: 0.4 mg/dL (ref 0.0–1.2)
Calcium: 9.5 mg/dL (ref 8.7–10.2)
Chloride: 103 mmol/L (ref 96–106)
Creatinine, Ser: 0.88 mg/dL (ref 0.57–1.00)
GFR calc Af Amer: 93 mL/min/{1.73_m2} (ref >59)
GFR calc non Af Amer: 81 mL/min/{1.73_m2} (ref >59)
Globulin, Total: 2.9 g/dL (ref 1.5–4.5)
Glucose: 75 mg/dL (ref 65–99)
Potassium: 4.2 mmol/L (ref 3.5–5.2)
Sodium: 140 mmol/L (ref 134–144)
Total Protein: 7.3 g/dL (ref 6.0–8.5)

## 2020-10-09 LAB — CBC WITH DIFFERENTIAL/PLATELET
Basophils Absolute: 0 10*3/uL (ref 0.0–0.2)
Basos: 0 %
EOS (ABSOLUTE): 0.4 10*3/uL (ref 0.0–0.4)
Eos: 4 %
Hematocrit: 40.1 % (ref 34.0–46.6)
Hemoglobin: 13.2 g/dL (ref 11.1–15.9)
Immature Grans (Abs): 0 10*3/uL (ref 0.0–0.1)
Immature Granulocytes: 0 %
Lymphocytes Absolute: 3.1 10*3/uL (ref 0.7–3.1)
Lymphs: 29 %
MCH: 27 pg (ref 26.6–33.0)
MCHC: 32.9 g/dL (ref 31.5–35.7)
MCV: 82 fL (ref 79–97)
Monocytes Absolute: 0.7 10*3/uL (ref 0.1–0.9)
Monocytes: 7 %
Neutrophils Absolute: 6.4 10*3/uL (ref 1.4–7.0)
Neutrophils: 60 %
Platelets: 263 10*3/uL (ref 150–450)
RBC: 4.89 x10E6/uL (ref 3.77–5.28)
RDW: 13.2 % (ref 11.7–15.4)
WBC: 10.7 10*3/uL (ref 3.4–10.8)

## 2020-10-09 LAB — LIPID PANEL
Chol/HDL Ratio: 3.9 ratio (ref 0.0–4.4)
Cholesterol, Total: 154 mg/dL (ref 100–199)
HDL: 39 mg/dL — ABNORMAL LOW (ref >39)
LDL Chol Calc (NIH): 102 mg/dL — ABNORMAL HIGH (ref 0–99)
Triglycerides: 65 mg/dL (ref 0–149)
VLDL Cholesterol Cal: 13 mg/dL (ref 5–40)

## 2020-10-12 ENCOUNTER — Ambulatory Visit: Payer: Self-pay | Admitting: Cardiology

## 2020-10-12 NOTE — Progress Notes (Signed)
Rescheduled

## 2020-10-16 ENCOUNTER — Ambulatory Visit: Payer: Self-pay | Admitting: Cardiology

## 2020-10-22 NOTE — Progress Notes (Signed)
I can see her this mor    Patient is here for follow up visit.  Subjective:   Lydia Thompson, female    DOB: November 09, 1975, 45 y.o.   MRN: [id number]   Chief Complaint  Patient presents with  . Chest Pain    45 year old [place] American female with hypertrophic cardiomyopathy, history of SVT, TIA, h/o COVID  Patient's chest pain has resolved.  She stays active with regular walking, has lost weight, denies any exertional chest pain, shortness of breath symptoms.  She does take Lasix 20 mg twice daily, but with which her leg edema is well controlled.   Current Outpatient Medications on File Prior to Visit  Medication Sig Dispense Refill  . aspirin EC 81 MG tablet Take 1 tablet (81 mg total) by mouth daily. 90 tablet 3  . Cholecalciferol (VITAMIN D3) 50 MCG (2000 UT) CHEW Chew 1 mcg by mouth daily.    Marland Kitchen conjugated estrogens (PREMARIN) vaginal cream Place 1 Applicatorful vaginally daily. 42.5 g 12  . diltiazem (CARDIZEM CD) 180 MG 24 hr capsule Take 1 capsule (180 mg total) by mouth daily. 90 capsule 3  . furosemide (LASIX) 20 MG tablet Take 1 tablet (20 mg total) by mouth daily as needed for fluid or edema. 60 tablet 3  . isosorbide mononitrate (IMDUR) 30 MG 24 hr tablet Take 1 tablet (30 mg total) by mouth daily. 30 tablet 3  . Liraglutide -Weight Management (SAXENDA) 18 MG/3ML SOPN Inject 0.6 mg into the skin daily for 7 days, THEN 1.2 mg daily for 7 days, THEN 1.8 mg daily. 11.1 mL 0  . metoprolol succinate (TOPROL-XL) 50 MG 24 hr tablet Take 1 tablet (50 mg total) by mouth daily. Take with or immediately following a meal. 90 tablet 3  . spironolactone (ALDACTONE) 25 MG tablet Take 25 mg by mouth daily.     No current facility-administered medications on file prior to visit.    Cardiovascular studies:  EKG 09/18/2020: Sinus rhythm 86 bpm  Biatrial enlargement Left ventricular hypertrophy Nonspecific ST depression, likely due to LVH No change compared to previous  EKG's  Echocardiogram 05/12/2020:  Left ventricle cavity is normal in size. Severe concentric hypertrophy of  the left ventricle. Normal global wall motion. Normal LV systolic function  with EF 56%. Indeterminate diastolic filling pattern.  Left atrial cavity is mildly dilated.  Posteriorly directed, moderate (Grade II) mitral regurgitation.  Structurally normal trileaflet aortic valve. Resting LVOT max gradient 18  mmHg. No significant valvular stenosis. No regurgitation.  Mild tricuspid regurgitation.  No evidence of pulmonary hypertension.  No significant change compared to previous study on 02/01/2019.  EKG 11/08/2019: Sinus rhythm 52 bpm. Incomplete left bundle branch block.  Left atrial enlargement.   Echocardiogram 02/01/2019: Normal LV systolic function with EF 56%. Left ventricle cavity is normal in size. Severe asymmetric hypertrophy of the left ventricle. Resting LVOT max gradient 12 mmHg.  Normal global wall motion. Indeterminate diastolic filling pattern.  Left atrial cavity is severely dilated. Trileaflet aortic valve. No significant valvular stenosis. Trace aortic regurgitation. Moderate (Grade III) mitral regurgitation. Mild tricuspid regurgitation. Estimated pulmonary artery systolic pressure 24 mmHg. No significant change compared to previous study on 01/07/2018.  Coronary CT angiogram 10/17/2018: 1. Coronary calcium score of 0. This was 0 percentile for age and sex matched control. 2. Normal coronary origin with right dominance. 3. No evidence of CAD. 4. Moderately dilated pulmonary artery measuring 36 mm suggestive of pulmonary hypertension.   Exercise Treadmill Stress Test 01/12/2018:  Indication: SoB  The patient exercised on Bruce protocol for 4:00 min. Patient achieved  5.84 METS and reached HR  154 bpm, which is   85% of maximum age-predicted HR.  Stress test terminated due to 7/10 CP and Fatigue.  Exercise capacity was below average for age.  HR  Response to Exercise: Exaggerated HR response. BP Response to Exercise: Normal resting BP- appropriate response. Chest Pain: limiting. Arrhythmias: none. ST Changes:  Rest: Sinus rhythm 90 bpm. 1-2 mm T wave inversions leads II, III, aVF Stress: Sinus tachycardia. >2 mm T wave inversions leads II, III, aVF, V5,V 6. <1 mm ST elevation lead aVR. ST changes persist 2 min into recovery.   Overall Impression: Positive stress test typical of diffuse subendocardial ischemia. Consider further cardiac workup.  Recent labs: 07/09/2020: Glucose 79, BUN/Cr 12/0.82. EGFR 101. Na/K 136/4.4. Rest of the CMP normal Chol 172, TG 59, HDL 37, LDL 123  03/2020: H/H 13/41. MCV 81. Platelets 219  11/2019: TSH 1.4  Review of Systems  Cardiovascular: Positive for chest pain. Negative for dyspnea on exertion, leg swelling, palpitations and syncope.       Objective:    Vitals:   10/23/20 0838  BP: 117/82  Pulse: 75  Resp: 17  Temp: 97.9 F (36.6 C)  SpO2: 100%   Filed Weights   10/23/20 0838  Weight: 231 lb 6.4 oz (105 kg)      Physical Exam Vitals and nursing note reviewed.  Constitutional:      General: She is not in acute distress. Neck:     Vascular: No JVD.  Cardiovascular:     Rate and Rhythm: Normal rate and regular rhythm.     Pulses: Intact distal pulses.     Heart sounds: Murmur (II/VI ESM RUSB) heard.    Pulmonary:     Effort: Pulmonary effort is normal.     Breath sounds: Normal breath sounds. No wheezing or rales.  Abdominal:     Tenderness: There is no rebound.         Assessment & Recommendations:   45 year old [place] American female with hypertrophic cardiomyopathy, history of SVT, TIA, h/o COVID  Chest pain: Resolved.    Hypertrophic cardiomyopathy (HCC) Septal thickness 2.1 cm. No indication for ICD. Continue metoprolol and diltiazem. Continue metoprolol succinate 50 mg daily  Chronic heart failure with preserved ejection fraction (HCC) Due to  #2. Currently stable.  Tolerating low dose spironolactone. Recommend daily weights. Discussed importance of low salt diet.   H/o TIA: No recurrence. Continue Aspirin 81 mg daily.  F/u in 6 months  Bora Bost Esther Hardy, MD [address] [facility] Pager: [phone] Office: [phone] If no answer Cell [phone]

## 2020-10-23 ENCOUNTER — Ambulatory Visit: Payer: Self-pay | Admitting: Cardiology

## 2020-10-23 ENCOUNTER — Encounter: Payer: Self-pay | Admitting: Cardiology

## 2020-10-23 ENCOUNTER — Other Ambulatory Visit: Payer: Self-pay | Admitting: Cardiology

## 2020-10-23 ENCOUNTER — Other Ambulatory Visit: Payer: Self-pay

## 2020-10-23 VITALS — BP 117/82 | HR 75 | Temp 97.9°F | Resp 17 | Ht 64.0 in | Wt 231.4 lb

## 2020-10-23 DIAGNOSIS — I422 Other hypertrophic cardiomyopathy: Secondary | ICD-10-CM

## 2020-10-23 DIAGNOSIS — I5032 Chronic diastolic (congestive) heart failure: Secondary | ICD-10-CM

## 2020-10-23 DIAGNOSIS — I10 Essential (primary) hypertension: Secondary | ICD-10-CM

## 2020-10-23 MED ORDER — FUROSEMIDE 20 MG PO TABS: 20.0000 mg | ORAL_TABLET | Freq: Two times a day (BID) | ORAL | 3 refills | Status: DC

## 2020-10-23 MED FILL — FUROSEMIDE 20 MG TABS: 20 | 30 days supply | Qty: 60 | Fill #0

## 2020-11-21 ENCOUNTER — Other Ambulatory Visit: Payer: Self-pay

## 2020-12-07 ENCOUNTER — Other Ambulatory Visit: Payer: Self-pay

## 2020-12-07 ENCOUNTER — Other Ambulatory Visit: Payer: Self-pay | Admitting: Cardiology

## 2020-12-07 DIAGNOSIS — I422 Other hypertrophic cardiomyopathy: Secondary | ICD-10-CM

## 2020-12-07 DIAGNOSIS — I5189 Other ill-defined heart diseases: Secondary | ICD-10-CM

## 2020-12-07 MED FILL — Metoprolol Succinate Tab ER 24HR 50 MG (Tartrate Equiv): ORAL | 30 days supply | Qty: 30 | Fill #0 | Status: AC

## 2020-12-07 MED FILL — Furosemide Tab 20 MG: ORAL | 30 days supply | Qty: 60 | Fill #0 | Status: AC

## 2020-12-08 ENCOUNTER — Other Ambulatory Visit: Payer: Self-pay

## 2020-12-08 MED ORDER — SPIRONOLACTONE 25 MG PO TABS
25.0000 mg | ORAL_TABLET | Freq: Every day | ORAL | 3 refills | Status: DC
  Filled 2020-12-08: qty 30, 30d supply, fill #0
  Filled 2021-01-05: qty 90, 90d supply, fill #1
  Filled 2021-04-07: qty 90, 90d supply, fill #2

## 2021-01-05 ENCOUNTER — Other Ambulatory Visit: Payer: Self-pay

## 2021-01-05 MED FILL — Metoprolol Succinate Tab ER 24HR 50 MG (Tartrate Equiv): ORAL | 90 days supply | Qty: 90 | Fill #1 | Status: AC

## 2021-01-06 ENCOUNTER — Other Ambulatory Visit: Payer: Self-pay

## 2021-01-08 ENCOUNTER — Other Ambulatory Visit: Payer: Self-pay

## 2021-01-08 ENCOUNTER — Encounter: Payer: Self-pay | Admitting: Nurse Practitioner

## 2021-01-08 ENCOUNTER — Ambulatory Visit (INDEPENDENT_AMBULATORY_CARE_PROVIDER_SITE_OTHER): Payer: Self-pay | Admitting: Nurse Practitioner

## 2021-01-08 VITALS — BP 111/70 | HR 64 | Temp 97.3°F | Ht 64.0 in | Wt 233.0 lb

## 2021-01-08 DIAGNOSIS — I5032 Chronic diastolic (congestive) heart failure: Secondary | ICD-10-CM

## 2021-01-08 DIAGNOSIS — N952 Postmenopausal atrophic vaginitis: Secondary | ICD-10-CM

## 2021-01-08 DIAGNOSIS — I10 Essential (primary) hypertension: Secondary | ICD-10-CM

## 2021-01-08 DIAGNOSIS — Z6841 Body Mass Index (BMI) 40.0 and over, adult: Secondary | ICD-10-CM

## 2021-01-08 MED ORDER — PREMARIN 0.625 MG/GM VA CREA
1.0000 | TOPICAL_CREAM | Freq: Every day | VAGINAL | 12 refills | Status: DC
  Filled 2021-01-08: qty 30, 15d supply, fill #0

## 2021-01-08 NOTE — Patient Instructions (Addendum)
Black Cohosh, Cimicifuga racemosa oral dosage forms What is this medicine? BLACK COHOSH (blak KOH hosh) or Cimicifuga racemosa is a dietary supplement. It is promoted to relieve symptoms of menopause, such as hot flashes. The FDA has not approved this supplement for any medical use. This supplement may be used for other purposes; ask your health care provider or pharmacist if you have questions. This medicine may be used for other purposes; ask your health care provider or pharmacist if you have questions. What should I tell my health care provider before I take this medicine? They need to know if you have any of these conditions:  breast cancer  cervical, ovarian or uterine cancer  high blood pressure  infertility  liver disease  menstrual changes or irregular periods  unusual vaginal or uterine bleeding  an unusual or allergic reaction to black cohosh, soybeans, tartrazine dye (yellow dye number 5), other medicines, foods, dyes, or preservatives  pregnant or trying to get pregnant  breast-feeding How should I use this medicine? Take this herb by mouth with a glass of water. Follow the directions on the package labeling, or talk to your health care professional. Do not use for longer than 6 months without the advice of a health care professional. Do not use if you are pregnant or breast-feeding. Talk to your obstetrician-gynecologist or certified nurse-midwife. This herb is not for use in children under the age of 20 years. Overdosage: If you think you have taken too much of this medicine contact a poison control center or emergency room at once. NOTE: This medicine is only for you. Do not share this medicine with others. What if I miss a dose? If you miss a dose, take it as soon as you can. If it is almost time for your next dose, take only that dose. Do not take double or extra doses. What may interact with this medicine?  atorvastatin  cisplatin  fertility treatments This  list may not describe all possible interactions. Give your health care provider a list of all the medicines, herbs, non-prescription drugs, or dietary supplements you use. Also tell them if you smoke, drink alcohol, or use illegal drugs. Some items may interact with your medicine. What should I watch for while using this medicine? Since this herb is derived from a plant, allergic reactions are possible. Stop using this herb if you develop a rash. You may need to see your health care professional, or inform them that this occurred. Report any unusual side effects promptly. If you are taking this herb for menstrual or menopausal symptoms, visit your doctor or health care professional for regular checks on your progress. You should have a complete check-up every 6 months. You will need a regular breast and pelvic exam while on this therapy. Follow the advice of your doctor or health care professional. Women should inform their doctor if they wish to become pregnant or think they might be pregnant. If you have any reason to think you are pregnant, stop taking this herb at once and contact your doctor or health care professional. Herbal or dietary supplements are not regulated like medicines. Rigid quality control standards are not required for dietary supplements. The purity and strength of these products can vary. The safety and effect of this dietary supplement for a certain disease or illness is not well known. This product is not intended to diagnose, treat, cure or prevent any disease. The Food and Drug Administration suggests the following to help consumers protect themselves:  Always read product labels and follow directions.  Natural does not mean a product is safe for humans to take.  Look for products that include USP after the ingredient name. This means that the manufacturer followed the standards of the [place] Pharmacopoeia.  Supplements made or sold by a nationally known food or drug company  are more likely to be made under tight controls. You can write to the company for more information about how the product was made. What side effects may I notice from receiving this medicine? Side effects that you should report to your doctor or health care professional as soon as possible:  allergic reactions like skin rash, itching or hives, swelling of the face, lips, or tongue  breathing problems  dizziness  palpitations  signs and symptoms of liver injury like dark yellow or brown urine; general ill feeling or flu-like symptoms; light-colored stools; loss of appetite; nausea; right upper belly pain; unusually weak or tired; yellowing of the eyes or skin  unusual vaginal bleeding Side effects that usually do not require medical attention (report to your doctor or health care professional if they continue or are bothersome):  breast tenderness  headache  nausea  upset stomach This list may not describe all possible side effects. Call your doctor for medical advice about side effects. You may report side effects to FDA at 1-800-FDA-1088. Where should I keep my medicine? Keep out of the reach of children. Store at room temperature between 15 and 30 degrees C (59 and 86 degrees C). Throw away any unused herb after the expiration date. NOTE: This sheet is a summary. It may not cover all possible information. If you have questions about this medicine, talk to your doctor, pharmacist, or health care provider.  2021 Elsevier/Gold Standard (2016-02-17 14:35:09)   Diabetes Mellitus and Nutrition, Adult When you have diabetes, or diabetes mellitus, it is very important to have healthy eating habits because your blood sugar (glucose) levels are greatly affected by what you eat and drink. Eating healthy foods in the right amounts, at about the same times every day, can help you:  Control your blood glucose.  Lower your risk of heart disease.  Improve your blood pressure.  Reach or  maintain a healthy weight. What can affect my meal plan? Every person with diabetes is different, and each person has different needs for a meal plan. Your health care provider may recommend that you work with a dietitian to make a meal plan that is best for you. Your meal plan may vary depending on factors such as:  The calories you need.  The medicines you take.  Your weight.  Your blood glucose, blood pressure, and cholesterol levels.  Your activity level.  Other health conditions you have, such as heart or kidney disease. How do carbohydrates affect me? Carbohydrates, also called carbs, affect your blood glucose level more than any other type of food. Eating carbs naturally raises the amount of glucose in your blood. Carb counting is a method for keeping track of how many carbs you eat. Counting carbs is important to keep your blood glucose at a healthy level, especially if you use insulin or take certain oral diabetes medicines. It is important to know how many carbs you can safely have in each meal. This is different for every person. Your dietitian can help you calculate how many carbs you should have at each meal and for each snack. How does alcohol affect me? Alcohol can cause a sudden decrease in  blood glucose (hypoglycemia), especially if you use insulin or take certain oral diabetes medicines. Hypoglycemia can be a life-threatening condition. Symptoms of hypoglycemia, such as sleepiness, dizziness, and confusion, are similar to symptoms of having too much alcohol.  Do not drink alcohol if: ? Your health care provider tells you not to drink. ? You are pregnant, may be pregnant, or are planning to become pregnant.  If you drink alcohol: ? Do not drink on an empty stomach. ? Limit how much you use to:  0-1 drink a day for women.  0-2 drinks a day for men. ? Be aware of how much alcohol is in your drink. In the [place] one drink equals one 12 oz bottle of beer (355 mL), one 5 oz  glass of wine (148 mL), or one 1 oz glass of hard liquor (44 mL). ? Keep yourself hydrated with water, diet soda, or unsweetened iced tea.  Keep in mind that regular soda, juice, and other mixers may contain a lot of sugar and must be counted as carbs. What are tips for following this plan? Reading food labels  Start by checking the serving size on the "Nutrition Facts" label of packaged foods and drinks. The amount of calories, carbs, fats, and other nutrients listed on the label is based on one serving of the item. Many items contain more than one serving per package.  Check the total grams (g) of carbs in one serving. You can calculate the number of servings of carbs in one serving by dividing the total carbs by 15. For example, if a food has 30 g of total carbs per serving, it would be equal to 2 servings of carbs.  Check the number of grams (g) of saturated fats and trans fats in one serving. Choose foods that have a low amount or none of these fats.  Check the number of milligrams (mg) of salt (sodium) in one serving. Most people should limit total sodium intake to less than 2,300 mg per day.  Always check the nutrition information of foods labeled as "low-fat" or "nonfat." These foods may be higher in added sugar or refined carbs and should be avoided.  Talk to your dietitian to identify your daily goals for nutrients listed on the label. Shopping  Avoid buying canned, pre-made, or processed foods. These foods tend to be high in fat, sodium, and added sugar.  Shop around the outside edge of the grocery store. This is where you will most often find fresh fruits and vegetables, bulk grains, fresh meats, and fresh dairy. Cooking  Use low-heat cooking methods, such as baking, instead of high-heat cooking methods like deep frying.  Cook using healthy oils, such as olive, canola, or sunflower oil.  Avoid cooking with butter, cream, or high-fat meats. Meal planning  Eat meals and  snacks regularly, preferably at the same times every day. Avoid going long periods of time without eating.  Eat foods that are high in fiber, such as fresh fruits, vegetables, beans, and whole grains. Talk with your dietitian about how many servings of carbs you can eat at each meal.  Eat 4-6 oz (112-168 g) of lean protein each day, such as lean meat, chicken, fish, eggs, or tofu. One ounce (oz) of lean protein is equal to: ? 1 oz (28 g) of meat, chicken, or fish. ? 1 egg. ?  cup (62 g) of tofu.  Eat some foods each day that contain healthy fats, such as avocado, nuts, seeds, and fish.  What foods should I eat? Fruits Berries. Apples. Oranges. Peaches. Apricots. Plums. Grapes. Mango. Papaya. Pomegranate. Kiwi. Cherries. Vegetables Lettuce. Spinach. Leafy greens, including kale, chard, collard greens, and mustard greens. Beets. Cauliflower. Cabbage. Broccoli. Carrots. Green beans. Tomatoes. Peppers. Onions. Cucumbers. Brussels sprouts. Grains Whole grains, such as whole-wheat or whole-grain bread, crackers, tortillas, cereal, and pasta. Unsweetened oatmeal. Quinoa. Brown or wild rice. Meats and other proteins Seafood. Poultry without skin. Lean cuts of poultry and beef. Tofu. Nuts. Seeds. Dairy Low-fat or fat-free dairy products such as milk, yogurt, and cheese. The items listed above may not be a complete list of foods and beverages you can eat. Contact a dietitian for more information. What foods should I avoid? Fruits Fruits canned with syrup. Vegetables Canned vegetables. Frozen vegetables with butter or cream sauce. Grains Refined white flour and flour products such as bread, pasta, snack foods, and cereals. Avoid all processed foods. Meats and other proteins Fatty cuts of meat. Poultry with skin. Breaded or fried meats. Processed meat. Avoid saturated fats. Dairy Full-fat yogurt, cheese, or milk. Beverages Sweetened drinks, such as soda or iced tea. The items listed above  may not be a complete list of foods and beverages you should avoid. Contact a dietitian for more information. Questions to ask a health care provider  Do I need to meet with a diabetes educator?  Do I need to meet with a dietitian?  What number can I call if I have questions?  When are the best times to check my blood glucose? Where to find more information:  American Diabetes Association: diabetes.org  Academy of Nutrition and Dietetics: www.eatright.[employer] of Diabetes and Digestive and Kidney Diseases: CarFlippers.tn  Association of Diabetes Care and Education Specialists: www.diabeteseducator.org Summary  It is important to have healthy eating habits because your blood sugar (glucose) levels are greatly affected by what you eat and drink.  A healthy meal plan will help you control your blood glucose and maintain a healthy lifestyle.  Your health care provider may recommend that you work with a dietitian to make a meal plan that is best for you.  Keep in mind that carbohydrates (carbs) and alcohol have immediate effects on your blood glucose levels. It is important to count carbs and to use alcohol carefully. This information is not intended to replace advice given to you by your health care provider. Make sure you discuss any questions you have with your health care provider. Document Revised: 07/16/2019 Document Reviewed: 07/16/2019 Elsevier Patient Education  2021 Elsevier Inc.   Type 2 Diabetes Mellitus, Diagnosis, Adult Type 2 diabetes (type 2 diabetes mellitus) is a long-term disease. It may happen when there is one or both of these problems:  The pancreas does not make enough insulin.  The body does not react in a normal way to insulin that it makes. Insulin lets sugars go into cells in your body. If you have type 2 diabetes, sugars cannot get into your cells. Sugars build up in the blood. This causes high blood sugar. What are the causes? The  exact cause of this condition is not known. What increases the risk? The following factors may make you more likely to develop this condition:  Having type 2 diabetes in your family.  Being overweight or very overweight.  Not being active.  Your body not reacting in a normal way to the insulin it makes.  Having higher than normal blood sugar over time.  Having a type of diabetes when you were  pregnant.  Having a condition that causes small fluid-filled sacs on your ovaries. What are the signs or symptoms? At first, you may have no symptoms. You will get symptoms slowly. They may include:  More thirst than normal.  More hunger than normal.  Needing to pee more than normal.  Losing weight without trying.  Feeling tired.  Feeling weak.  Seeing things blurry.  Dark patches on your skin. How is this treated? This condition may be treated by a diabetes expert. You may need to:  Follow an eating plan made by a food expert (dietitian).  Get regular exercise.  Find ways to deal with stress.  Check blood sugar as often as told.  Take medicines. Your doctor will set treatment goals for you. Your blood sugar should be at these levels:  Before meals: 80-130 mg/dL (9.8-1.14.4-7.2 mmol/L).  After meals: below 180 mg/dL (10 mmol/L).  Over the last 2-3 months: less than 7%. Follow these instructions at home: Medicines  Take your diabetes medicines or insulin every day.  Take medicines to help you not get other problems caused by this condition. You may need: ? Aspirin. ? Medicine to lower cholesterol. ? Medicine to control blood pressure. Questions to ask your doctor  Should I meet with a diabetes educator?  What medicines do I need, and when should I take them?  What will I need to treat my condition at home?  When should I check my blood sugar?  Where can I find a support group?  Who can I call if I have questions?  When is my next doctor visit? General  instructions  Take over-the-counter and prescription medicines only as told by your doctor.  Keep all follow-up visits as told by your doctor. This is important. Where to find more information  American Diabetes Association (ADA): www.diabetes.org  American Association of Diabetes Care and Education Specialists (ADCES): www.diabeteseducator.org  International Diabetes Federation (IDF): DCOnly.dkwww.idf.org Contact a doctor if:  Your blood sugar is at or above 240 mg/dL (91.413.3 mmol/L) for 2 days in a row.  You have been sick for 2 days or more, and you are not getting better.  You have had a fever for 2 days or more, and you are not getting better.  You have any of these problems for more than 6 hours: ? You cannot eat or drink. ? You feel like you may vomit. ? You vomit. ? You have watery poop (diarrhea). Get help right away if:  Your blood sugar is very low. This means it is lower than 54 mg/dL (3 mmol/L).  You feel mixed up (confused).  You have trouble thinking clearly.  You have trouble breathing.  You have medium or large ketone levels in your pee. These symptoms may be an emergency. Do not wait to see if the symptoms will go away. Get medical help right away. Call your local emergency services (911 in the [place] Do not drive yourself to the hospital. Summary  Type 2 diabetes is a long-term disease. Your pancreas may not make enough insulin, or your body may not react in a normal way to insulin that it makes.  This condition is treated with an eating plan, lifestyle changes, and medicines.  Your doctor will set treatment goals for you. These will help you keep your blood sugar in a healthy range.  Keep all follow-up visits as told by your doctor. This is important. This information is not intended to replace advice given to you by your health  care provider. Make sure you discuss any questions you have with your health care provider. Document Revised: 03/04/2020 Document  Reviewed: 03/04/2020 Elsevier Patient Education  2021 Elsevier Inc.   Blood Glucose Monitoring, Adult Monitoring your blood sugar (glucose) is an important part of managing your diabetes. Blood glucose monitoring involves checking your blood glucose as often as directed and keeping a log or record of your results over time. Checking your blood glucose regularly and keeping a blood glucose log can:  Help you and your health care provider adjust your diabetes management plan as needed, including your medicines or insulin.  Help you understand how food, exercise, illnesses, and medicines affect your blood glucose.  Let you know what your blood glucose is at any time. You can quickly find out if you have low blood glucose (hypoglycemia) or high blood glucose (hyperglycemia). Your health care provider will set individualized treatment goals for you. Your goals will be based on your age, other medical conditions you have, and how you respond to diabetes treatment. Generally, the goal of treatment is to maintain the following blood glucose levels:  Before meals (preprandial): 80-130 mg/dL (3.7-1.0 mmol/L).  After meals (postprandial): below 180 mg/dL (10 mmol/L).  A1C level: less than 7%. Supplies needed:  Blood glucose meter.  Test strips for your meter. Each meter has its own strips. You must use the strips that came with your meter.  A needle to prick your finger (lancet). Do not use a lancet more than one time.  A device that holds the lancet (lancing device).  A journal or log book to write down your results. How to check your blood glucose Checking your blood glucose 1. Wash your hands for at least 20 seconds with soap and water. 2. Prick the side of your finger (not the tip) with the lancet. Do not use the same finger consecutively. 3. Gently rub the finger until a small drop of blood appears. 4. Follow instructions that come with your meter for inserting the test strip, applying  blood to the strip, and using your blood glucose meter. 5. Write down your result and any notes in your log.   Using alternative sites Some meters allow you to use areas of your body other than your finger (alternative sites) to test your blood. The most common alternative sites are the forearm, the thigh, and the palm of your hand. Alternative sites may not be as accurate as the fingers because blood flow is slower in those areas. This means that the result you get may be delayed, and it may be different from the result that you would get from your finger. Use the finger only, and do not use alternative sites, if:  You think you have hypoglycemia.  You sometimes do not know that your blood glucose is getting low (hypoglycemia unawareness). General tips and recommendations Blood glucose log  Every time you check your blood glucose, write down your result. Also write down any notes about things that may be affecting your blood glucose, such as your diet and exercise for the day. This information can help you and your health care provider: ? Look for patterns in your blood glucose over time. ? Adjust your diabetes management plan as needed.  Check if your meter allows you to download your records to a computer or if there is an app for the meter. Most glucose meters store a record of glucose readings in the meter.   If you have type 1 diabetes:  Check  your blood glucose 4 or more times a day if you are on intensive insulin therapy with multiple daily injections (MDI) or if you are using an insulin pump. Check your blood glucose: ? Before every meal and snack. ? Before bedtime.  Also check your blood glucose: ? If you have symptoms of hypoglycemia. ? After treating low blood glucose. ? Before doing activities that create a risk for injury, like driving or using machinery. ? Before and after exercise. ? Two hours after a meal. ? Occasionally between 2:00 a.m. and 3:00 a.m., as  directed.  You may need to check your blood glucose more often, 6-10 times per day, if: ? You have diabetes that is not well controlled. ? You are ill. ? You have a history of severe hypoglycemia. ? You have hypoglycemia unawareness. If you have type 2 diabetes:  Check your blood glucose 2 or more times a day if you take insulin or other diabetes medicines.  Check your blood glucose 4 or more times a day if you are on intensive insulin therapy. Occasionally, you may also need to check your glucose between 2:00 a.m. and 3:00 a.m., as directed.  Also check your blood glucose: ? Before and after exercise. ? Before doing activities that create a risk for injury, like driving or using machinery.  You may need to check your blood glucose more often if: ? Your medicine is being adjusted. ? Your diabetes is not well controlled. ? You are ill. General tips  Make sure you always have your supplies with you.  After you use a few boxes of test strips, adjust (calibrate) your blood glucose meter by following instructions that came with your meter.  If you have questions or need help, all blood glucose meters have a 24-hour hotline phone number available that you can call. Also contact your health care provider with questions or concerns you may have. Where to find more information  The American Diabetes Association: www.diabetes.org  The Association of Diabetes Care & Education Specialists: www.diabeteseducator.org Contact a health care provider if:  Your blood glucose is at or above 240 mg/dL (08.1 mmol/L) for 2 days in a row.  You have been sick or have had a fever for 2 days or longer, and you are not getting better.  You have any of the following problems for more than 6 hours: ? You cannot eat or drink. ? You have nausea or vomiting. ? You have diarrhea. Get help right away if:  Your blood glucose is lower than 54 mg/dL (3 mmol/L).  You become confused, or you have trouble  thinking clearly.  You have difficulty breathing.  You have moderate or large ketone levels in your urine. These symptoms may represent a serious problem that is an emergency. Do not wait to see if the symptoms will go away. Get medical help right away. Call your local emergency services (911 in the [place] Do not drive yourself to the hospital. Summary  Monitoring your blood glucose is an important part of managing your diabetes.  Blood glucose monitoring involves checking your blood glucose as often as directed and keeping a log or record of your results over time.  Your health care provider will set individualized treatment goals for you. Your goals will be based on your age, other medical conditions you have, and how you respond to diabetes treatment.  Every time you check your blood glucose, write down your result. Also, write down any notes about things that may be  affecting your blood glucose, such as your diet and exercise for the day. This information is not intended to replace advice given to you by your health care provider. Make sure you discuss any questions you have with your health care provider. Document Revised: 05/06/2020 Document Reviewed: 05/06/2020 Elsevier Patient Education  2021 [employer].

## 2021-01-08 NOTE — Progress Notes (Signed)
[facility] Patient [facility] [address] [place], [place]  [address] Phone:  [phone]   Fax:  [phone]   Established Patient Office Visit  Subjective:  Patient ID: Lydia Thompson, female    DOB: July 13, 1976  Age: 45 y.o. MRN: [id number]  CC:  Chief Complaint  Patient presents with  . Follow-up    3 month follow up, hypertension    HPI Lydia Thompson presents for follow up. She  has a past medical history of CHF (congestive heart failure) (HCC) and Hypertension.   Hypertension Patient is here for follow-up of elevated blood pressure. She is not exercising and is adherent to a low-salt diet. Blood pressure is well controlled at home. Cardiac symptoms: exertional chest pressure/discomfort and irregular heart beat. Patient denies chest pain, chest pressure/discomfort, claudication, dyspnea, irregular heart beat, near-syncope, orthopnea, palpitations, paroxysmal nocturnal dyspnea, syncope and tachypnea. Cardiovascular risk factors: hypertension, obesity (BMI >= 30 kg/m2) and sedentary lifestyle. Use of agents associated with hypertension: none. History of target organ damage: heart failure.  Past Medical History:  Diagnosis Date  . CHF (congestive heart failure) (HCC)   . Hypertension     Past Surgical History:  Procedure Laterality Date  . ABDOMINAL HYSTERECTOMY      Family History  Problem Relation Age of Onset  . Hypertension Mother   . Heart disease Paternal Uncle     Social History   Socioeconomic History  . Marital status: Single    Spouse name: Not on file  . Number of children: 0  . Years of education: Not on file  . Highest education level: Not on file  Occupational History  . Not on file  Tobacco Use  . Smoking status: Never Smoker  . Smokeless tobacco: Never Used  Vaping Use  . Vaping Use: Never used  Substance and Sexual Activity  . Alcohol use: Yes    Alcohol/week: 0.0 standard drinks    Comment: social  . Drug use: No  . Sexual  activity: Not Currently  Other Topics Concern  . Not on file  Social History Narrative  . Not on file   Social Determinants of Health   Financial Resource Strain: Not on file  Food Insecurity: Not on file  Transportation Needs: Not on file  Physical Activity: Not on file  Stress: Not on file  Social Connections: Not on file  Intimate Partner Violence: Not on file    Outpatient Medications Prior to Visit  Medication Sig Dispense Refill  . Cholecalciferol (VITAMIN D3) 50 MCG (2000 UT) CHEW Chew 1 mcg by mouth daily.    Marland Kitchen diltiazem (CARDIZEM CD) 180 MG 24 hr capsule TAKE 1 CAPSULE (180 MG TOTAL) BY MOUTH DAILY. 90 capsule 3  . furosemide (LASIX) 20 MG tablet TAKE 1 TABLET (20 MG TOTAL) BY MOUTH 2 (TWO) TIMES DAILY. 180 tablet 3  . metoprolol succinate (TOPROL-XL) 50 MG 24 hr tablet TAKE 1 TABLET (50 MG TOTAL) BY MOUTH DAILY. TAKE WITH OR IMMEDIATELY FOLLOWING A MEAL. 90 tablet 3  . spironolactone (ALDACTONE) 25 MG tablet Take 1 tablet (25 mg total) by mouth daily. 90 tablet 3  . conjugated estrogens (PREMARIN) vaginal cream Place 1 Applicatorful vaginally daily. 42.5 g 12  . aspirin EC 81 MG tablet Take 1 tablet (81 mg total) by mouth daily. 90 tablet 3   No facility-administered medications prior to visit.    No Known Allergies  ROS Review of Systems    Objective:    Physical  Exam Constitutional:      Appearance: She is obese.  HENT:     Head: Normocephalic and atraumatic.  Cardiovascular:     Rate and Rhythm: Normal rate and regular rhythm.     Pulses: Normal pulses.     Heart sounds: Normal heart sounds.  Pulmonary:     Effort: Pulmonary effort is normal.     Breath sounds: Normal breath sounds.  Musculoskeletal:     Cervical back: Normal range of motion.     Right lower leg: No edema.     Left lower leg: No edema.  Skin:    General: Skin is warm and dry.     Capillary Refill: Capillary refill takes less than 2 seconds.  Neurological:     General: No focal  deficit present.     Mental Status: She is alert and oriented to person, place, and time.  Psychiatric:        Mood and Affect: Mood normal.        Behavior: Behavior normal.        Thought Content: Thought content normal.        Judgment: Judgment normal.     BP 111/70 (BP Location: Right Arm, Patient Position: Sitting, Cuff Size: Large)   Pulse 64   Temp (!) 97.3 F (36.3 C)   Ht 5\' 4"  (1.626 m)   Wt 233 lb 0.4 oz (105.7 kg)   SpO2 100%   BMI 40.00 kg/m  Wt Readings from Last 3 Encounters:  01/08/21 233 lb 0.4 oz (105.7 kg)  10/23/20 231 lb 6.4 oz (105 kg)  10/08/20 236 lb 9.6 oz (107.3 kg)     There are no preventive care reminders to display for this patient.  There are no preventive care reminders to display for this patient.  Lab Results  Component Value Date   TSH 1.470 12/13/2019   Lab Results  Component Value Date   WBC 10.7 10/08/2020   HGB 13.2 10/08/2020   HCT 40.1 10/08/2020   MCV 82 10/08/2020   PLT 263 10/08/2020   Lab Results  Component Value Date   NA 140 10/08/2020   K 4.2 10/08/2020   CO2 26 02/19/2020   GLUCOSE 75 10/08/2020   BUN 12 10/08/2020   CREATININE 0.88 10/08/2020   BILITOT 0.4 10/08/2020   ALKPHOS 65 10/08/2020   AST 15 10/08/2020   ALT 14 09/26/2018   PROT 7.3 10/08/2020   ALBUMIN 4.4 10/08/2020   CALCIUM 9.5 10/08/2020   ANIONGAP 9 02/19/2020   Lab Results  Component Value Date   CHOL 154 10/08/2020   Lab Results  Component Value Date   HDL 39 (L) 10/08/2020   Lab Results  Component Value Date   LDLCALC 102 (H) 10/08/2020   Lab Results  Component Value Date   TRIG 65 10/08/2020   Lab Results  Component Value Date   CHOLHDL 3.9 10/08/2020   Lab Results  Component Value Date   HGBA1C 5.0 09/26/2018      Assessment & Plan:   Problem List Items Addressed This Visit      Cardiovascular and Mediastinum   Chronic heart failure with preserved ejection fraction (HCC) Stable we will follow-up with  cardiology as scheduled   Essential hypertension - Primary Controlled with current regimen will continue     Other   Obesity, Class III, BMI 40-49.9 (morbid obesity) (HCC) Persistent maintaining weight down 3 pounds over the months 3 months    Other Visit Diagnoses  Atrophic vaginitis     Persistent refill on Premarin cream Encouraged by cohosh or soy for hot flashes   Relevant Medications   conjugated estrogens (PREMARIN) vaginal cream   Class 3 severe obesity with body mass index (BMI) of 45.0 to 49.9 in adult, unspecified obesity type, unspecified whether serious comorbidity present (HCC)          Meds ordered this encounter  Medications  . conjugated estrogens (PREMARIN) vaginal cream    Sig: Place 1 Applicatorful vaginally daily.    Dispense:  42.5 g    Refill:  12    Order Specific Question:   Supervising Provider    Answer:   Quentin Angst [[id number]]    Follow-up: Return in about 3 months (around 04/10/2021) for Physcial VISIT,EST,40-64 [[id number]].    Barbette Merino, NP

## 2021-01-11 ENCOUNTER — Encounter: Payer: Self-pay | Admitting: Nurse Practitioner

## 2021-01-11 ENCOUNTER — Other Ambulatory Visit: Payer: Self-pay

## 2021-01-13 ENCOUNTER — Other Ambulatory Visit: Payer: Self-pay

## 2021-02-10 ENCOUNTER — Other Ambulatory Visit: Payer: Self-pay

## 2021-02-10 MED FILL — Furosemide Tab 20 MG: ORAL | 90 days supply | Qty: 180 | Fill #1 | Status: AC

## 2021-02-10 MED FILL — Diltiazem HCl Coated Beads Cap ER 24HR 180 MG: ORAL | 88 days supply | Qty: 88 | Fill #0 | Status: AC

## 2021-02-10 MED FILL — Diltiazem HCl Coated Beads Cap ER 24HR 180 MG: ORAL | 2 days supply | Qty: 2 | Fill #0 | Status: AC

## 2021-02-11 ENCOUNTER — Other Ambulatory Visit: Payer: Self-pay

## 2021-02-12 ENCOUNTER — Other Ambulatory Visit: Payer: Self-pay

## 2021-04-07 ENCOUNTER — Other Ambulatory Visit: Payer: Self-pay

## 2021-04-07 MED FILL — Metoprolol Succinate Tab ER 24HR 50 MG (Tartrate Equiv): ORAL | 30 days supply | Qty: 30 | Fill #2 | Status: AC

## 2021-04-08 ENCOUNTER — Other Ambulatory Visit: Payer: Self-pay

## 2021-04-09 ENCOUNTER — Encounter: Payer: Self-pay | Admitting: Nurse Practitioner

## 2021-04-09 ENCOUNTER — Ambulatory Visit (INDEPENDENT_AMBULATORY_CARE_PROVIDER_SITE_OTHER): Payer: Self-pay | Admitting: Nurse Practitioner

## 2021-04-09 ENCOUNTER — Other Ambulatory Visit: Payer: Self-pay

## 2021-04-09 ENCOUNTER — Ambulatory Visit: Payer: Self-pay | Admitting: Nurse Practitioner

## 2021-04-09 VITALS — BP 104/77 | HR 58 | Temp 97.4°F | Ht 64.0 in | Wt 234.0 lb

## 2021-04-09 DIAGNOSIS — Z01411 Encounter for gynecological examination (general) (routine) with abnormal findings: Secondary | ICD-10-CM

## 2021-04-09 DIAGNOSIS — N952 Postmenopausal atrophic vaginitis: Secondary | ICD-10-CM

## 2021-04-09 DIAGNOSIS — R6882 Decreased libido: Secondary | ICD-10-CM

## 2021-04-09 DIAGNOSIS — Z1239 Encounter for other screening for malignant neoplasm of breast: Secondary | ICD-10-CM

## 2021-04-09 DIAGNOSIS — Z124 Encounter for screening for malignant neoplasm of cervix: Secondary | ICD-10-CM

## 2021-04-09 DIAGNOSIS — I10 Essential (primary) hypertension: Secondary | ICD-10-CM

## 2021-04-09 DIAGNOSIS — N898 Other specified noninflammatory disorders of vagina: Secondary | ICD-10-CM

## 2021-04-09 DIAGNOSIS — I5032 Chronic diastolic (congestive) heart failure: Secondary | ICD-10-CM

## 2021-04-09 LAB — POCT URINALYSIS DIPSTICK
Bilirubin, UA: NEGATIVE
Blood, UA: NEGATIVE
Glucose, UA: NEGATIVE
Ketones, UA: NEGATIVE
Nitrite, UA: NEGATIVE
Protein, UA: NEGATIVE
Spec Grav, UA: 1.02 (ref 1.010–1.025)
Urobilinogen, UA: 0.2 E.U./dL
pH, UA: 5.5 (ref 5.0–8.0)

## 2021-04-09 NOTE — Patient Instructions (Signed)
You were seen today for your annual pap smear and follow up. We collected an estrogen level, pap smear and urinalysis. I will be messaging you with medication for your symptoms pending your estrogen results. Please maintain follow up with cardiology next month. Please continue taking all medications as prescribed.

## 2021-04-09 NOTE — Progress Notes (Addendum)
[facility] Patient [facility] [address] [place], [place]  [address] Phone:  [phone]   Fax:  [phone] Subjective:   Patient ID: Lydia Thompson, female    DOB: 27-Dec-1975, 45 y.o.   MRN: [id number]  Chief Complaint  Patient presents with   Gynecologic Exam   Lydia Thompson 45 y.o. female presents with  Hypertension This is a chronic problem. The current episode started more than 1 year ago. The problem is unchanged. Associated symptoms include chest pain and shortness of breath. Pertinent negatives include no headaches, malaise/fatigue, palpitations or peripheral edema. (Chest pain and shortness of breath occurs intermittently and is chronic, related to heart failure.) There are no associated agents to hypertension. Risk factors for coronary artery disease include family history, obesity and stress. Past treatments include beta blockers, diuretics and calcium channel blockers. The current treatment provides moderate improvement. There are no compliance problems.  Hypertensive end-organ damage includes heart failure.  Gynecologic Exam The patient's pertinent negatives include no genital itching, genital lesions, genital odor, pelvic pain or vaginal discharge. Primary symptoms comment: Patient has not had a menstrual cycle since hysterectomy in 2015. The patient is experiencing no pain. Pertinent negatives include no abdominal pain, back pain, dysuria, fever, flank pain, headaches or painful intercourse. Associated symptoms comments: Continues to endorse vaginal dryness that improves sometimes with prescribed Premarin. There has been no bleeding. She is not sexually active (States that she has not been sexually active in 6 mths due to decreased libido and vaginal dryness). No, her partner does not have an STD. Her past medical history is significant for a gynecological surgery.  Congestive Heart Failure Presents for follow-up visit. Associated symptoms include chest pain and  shortness of breath. Pertinent negatives include no abdominal pain or palpitations. The symptoms have been stable. The patient is experiencing no pain. Compliance with total regimen is 76-100%. Compliance with medications is 76-100%. Treatment side effects: No verbalized treatment side effects.  Weighs herself daily, has had a weight gain of 4 lbs within the past week, which she indicates is within her weekly weight goal.  Patient also endorses having intermittent periods of small amount of thick white discharge from nipples, in addition to have breast tenderness. She also complains of intermittent hot flashes, has tried OTC medications with no improvement in symptoms    Past Medical History:  Diagnosis Date   CHF (congestive heart failure) (HCC)    Hypertension     Past Surgical History:  Procedure Laterality Date   ABDOMINAL HYSTERECTOMY      Family History  Problem Relation Age of Onset   Hypertension Mother    Heart disease Paternal Uncle     Social History   Socioeconomic History   Marital status: Single    Spouse name: Not on file   Number of children: 0   Years of education: Not on file   Highest education level: Not on file  Occupational History   Not on file  Tobacco Use   Smoking status: Never   Smokeless tobacco: Never  Vaping Use   Vaping Use: Never used  Substance and Sexual Activity   Alcohol use: Yes    Alcohol/week: 0.0 standard drinks    Comment: social   Drug use: No   Sexual activity: Not Currently  Other Topics Concern   Not on file  Social History Narrative   Not on file   Social Determinants of Health   Financial Resource Strain: Not on file  Food Insecurity: Not on file  Transportation Needs: Not on file  Physical Activity: Not on file  Stress: Not on file  Social Connections: Not on file  Intimate Partner Violence: Not on file    Outpatient Medications Prior to Visit  Medication Sig Dispense Refill   Cholecalciferol (VITAMIN D3) 50  MCG (2000 UT) CHEW Chew 1 mcg by mouth daily.     conjugated estrogens (PREMARIN) vaginal cream Place 1 Applicatorful vaginally daily. 42.5 g 12   diltiazem (CARDIZEM CD) 180 MG 24 hr capsule TAKE 1 CAPSULE (180 MG TOTAL) BY MOUTH DAILY. 90 capsule 3   furosemide (LASIX) 20 MG tablet TAKE 1 TABLET (20 MG TOTAL) BY MOUTH 2 (TWO) TIMES DAILY. 180 tablet 3   metoprolol succinate (TOPROL-XL) 50 MG 24 hr tablet TAKE 1 TABLET (50 MG TOTAL) BY MOUTH DAILY. TAKE WITH OR IMMEDIATELY FOLLOWING A MEAL. 90 tablet 3   spironolactone (ALDACTONE) 25 MG tablet Take 1 tablet (25 mg total) by mouth daily. 90 tablet 3   No facility-administered medications prior to visit.    No Known Allergies  Review of Systems  Constitutional:  Negative for fever and malaise/fatigue.  Respiratory:  Positive for shortness of breath. Negative for cough.   Cardiovascular:  Positive for chest pain. Negative for palpitations and leg swelling.  Gastrointestinal:  Negative for abdominal pain.  Genitourinary:  Negative for dysuria, flank pain, pelvic pain and vaginal discharge.  Musculoskeletal:  Negative for back pain.  Neurological:  Negative for dizziness, tingling, weakness and headaches.  Psychiatric/Behavioral:  Negative for depression. The patient is not nervous/anxious.   All other systems reviewed and are negative.     Objective:    Physical Exam Exam conducted with a chaperone present.  Constitutional:      General: She is not in acute distress.    Appearance: Normal appearance.     Comments: Patient somewhat tearful when discussing decreased libido  HENT:     Head: Normocephalic.  Eyes:     Extraocular Movements: Extraocular movements intact.     Conjunctiva/sclera: Conjunctivae normal.     Pupils: Pupils are equal, round, and reactive to light.  Cardiovascular:     Rate and Rhythm: Normal rate and regular rhythm.     Pulses: Normal pulses.     Heart sounds: Normal heart sounds.     Comments: No obvious  peripheral edema Pulmonary:     Effort: Pulmonary effort is normal.     Breath sounds: Normal breath sounds.  Chest:  Breasts:    Breasts are symmetrical.     Right: Normal. No swelling, mass, nipple discharge, skin change or tenderness.     Left: Normal. No swelling, mass, nipple discharge, skin change or tenderness.  Genitourinary:    General: Normal vulva.     Labia:        Right: No rash, tenderness, lesion or injury.        Left: No rash, tenderness, lesion or injury.      Urethra: No prolapse, urethral pain, urethral swelling or urethral lesion.     Vagina: Vaginal discharge present.     Cervix: Discharge present.     Comments: Small amount of thick white discharge Musculoskeletal:        General: No swelling.  Lymphadenopathy:     Upper Body:     Right upper body: No axillary adenopathy.     Left upper body: No axillary adenopathy.  Skin:    General: Skin is warm and dry.  Capillary Refill: Capillary refill takes less than 2 seconds.  Neurological:     Mental Status: She is alert.  Psychiatric:        Mood and Affect: Mood normal.        Behavior: Behavior normal.        Thought Content: Thought content normal.        Judgment: Judgment normal.    BP 104/77 (BP Location: Right Arm, Patient Position: Sitting)   Pulse (!) 58   Temp (!) 97.4 F (36.3 C)   Ht 5\' 4"  (1.626 m)   Wt 234 lb (106.1 kg)   SpO2 99%   BMI 40.17 kg/m  Wt Readings from Last 3 Encounters:  04/09/21 234 lb (106.1 kg)  01/08/21 233 lb 0.4 oz (105.7 kg)  10/23/20 231 lb 6.4 oz (105 kg)    Immunization History  Administered Date(s) Administered   Tdap 10/18/2017    Diabetic Foot Exam - Simple   No data filed     Lab Results  Component Value Date   TSH 1.470 12/13/2019   Lab Results  Component Value Date   WBC 10.7 10/08/2020   HGB 13.2 10/08/2020   HCT 40.1 10/08/2020   MCV 82 10/08/2020   PLT 263 10/08/2020   Lab Results  Component Value Date   NA 140 10/08/2020   K  4.2 10/08/2020   CO2 26 02/19/2020   GLUCOSE 75 10/08/2020   BUN 12 10/08/2020   CREATININE 0.88 10/08/2020   BILITOT 0.4 10/08/2020   ALKPHOS 65 10/08/2020   AST 15 10/08/2020   ALT 14 09/26/2018   PROT 7.3 10/08/2020   ALBUMIN 4.4 10/08/2020   CALCIUM 9.5 10/08/2020   ANIONGAP 9 02/19/2020   Lab Results  Component Value Date   CHOL 154 10/08/2020   CHOL 172 07/09/2020   CHOL 154 12/13/2019   Lab Results  Component Value Date   HDL 39 (L) 10/08/2020   HDL 37 (L) 07/09/2020   HDL 35 (L) 12/13/2019   Lab Results  Component Value Date   LDLCALC 102 (H) 10/08/2020   LDLCALC 123 (H) 07/09/2020   LDLCALC 102 (H) 12/13/2019   Lab Results  Component Value Date   TRIG 65 10/08/2020   TRIG 59 07/09/2020   TRIG 88 12/13/2019   Lab Results  Component Value Date   CHOLHDL 3.9 10/08/2020   CHOLHDL 4.6 (H) 07/09/2020   CHOLHDL 4.4 12/13/2019   Lab Results  Component Value Date   HGBA1C 5.0 09/26/2018   HGBA1C 5.4 10/18/2017   HGBA1C 5.1 01/13/2015       Assessment & Plan:   Problem List Items Addressed This Visit       Cardiovascular and Mediastinum   Essential hypertension   Chronic heart failure with preserved ejection fraction (HCC)  Patient to maintain treatment goal established by cardiology  Maintain follow up appointment with cardiology next month   Discussed importance of completing vaccinations    Encouraged continued diet and exercise efforts    Encouraged continued compliance with medication            Other Visit Diagnoses     Vaginal leukorrhea    -  Primary   Screening for malignant neoplasm of cervix       Breast screening       Encounter for gynecological examination with abnormal finding       Relevant Orders   GC/Chlamydia Probe Amp (genital or urine) ([facility] / [facility])   IGP,  rfx Aptima HPV ASCU   Decreased libido       Relevant Orders   Estrogens, Total Gyn referral completed    Atrophic vaginitis      Estrogen level collected    Continue taking Premarin until otherwise advised, likely will change/ adjust within the next week, pending lab results  Consultation completed with patient primary cardiologist, Dr. Rosemary Holms, verbalizes no contraindication to estrogen therapy at this time Gyn referral completed   Patient to follow up in 3 mths for reevaluation of symptoms addressed today in clinic    I am having Lydia Thompson. Lydia Thompson maintain her Vitamin D3, furosemide, metoprolol succinate, diltiazem, spironolactone, and Premarin.  No orders of the defined types were placed in this encounter.    Kathrynn Speed, NP

## 2021-04-14 ENCOUNTER — Other Ambulatory Visit: Payer: Self-pay | Admitting: Nurse Practitioner

## 2021-04-14 ENCOUNTER — Other Ambulatory Visit: Payer: Self-pay

## 2021-04-14 LAB — ESTROGENS, TOTAL: Estrogen: 60 pg/mL

## 2021-04-14 LAB — GC/CHLAMYDIA PROBE AMP
Chlamydia trachomatis, NAA: NEGATIVE
Neisseria Gonorrhoeae by PCR: NEGATIVE

## 2021-04-14 MED ORDER — ESTRADIOL 0.025 MG/24HR TD PTWK
0.0250 mg | MEDICATED_PATCH | TRANSDERMAL | 0 refills | Status: DC
  Filled 2021-04-14: qty 4, 28d supply, fill #0

## 2021-04-14 NOTE — Addendum Note (Signed)
Addended by: Orion Crook on: 04/14/2021 08:20 AM   Modules accepted: Orders

## 2021-04-15 ENCOUNTER — Other Ambulatory Visit: Payer: Self-pay

## 2021-04-15 LAB — IGP, RFX APTIMA HPV ASCU

## 2021-04-19 ENCOUNTER — Ambulatory Visit: Payer: Self-pay | Admitting: Obstetrics and Gynecology

## 2021-04-27 ENCOUNTER — Ambulatory Visit (INDEPENDENT_AMBULATORY_CARE_PROVIDER_SITE_OTHER): Payer: Self-pay | Admitting: Obstetrics and Gynecology

## 2021-04-27 ENCOUNTER — Encounter: Payer: Self-pay | Admitting: Obstetrics and Gynecology

## 2021-04-27 ENCOUNTER — Other Ambulatory Visit: Payer: Self-pay

## 2021-04-27 ENCOUNTER — Other Ambulatory Visit (HOSPITAL_COMMUNITY)
Admission: RE | Admit: 2021-04-27 | Discharge: 2021-04-27 | Disposition: A | Discharge status: Home / Self Care | Payer: Self-pay | Source: Ambulatory Visit | Attending: Obstetrics and Gynecology | Admitting: Obstetrics and Gynecology

## 2021-04-27 VITALS — BP 130/78 | HR 70 | Resp 14 | Ht 65.0 in | Wt 235.0 lb

## 2021-04-27 DIAGNOSIS — N760 Acute vaginitis: Secondary | ICD-10-CM

## 2021-04-27 DIAGNOSIS — N951 Menopausal and female climacteric states: Secondary | ICD-10-CM

## 2021-04-27 NOTE — Patient Instructions (Addendum)
You still have your cervix, and I believe you likely still have your uterus based on your hospital pathology report from your surgery in 2004.  I recommend you stop using the estrogen patch and even the estrogen cream.  Try vaginal vitamin E suppositories to treat vaginal dryness.  You may also use a cooking oil such as coconut, canola or olive oil.  Please schedule a mammogram and ask about scholarship to pay for this.  You have the phone numbers on the paper I gave you.   Lydia Simmonds, MD   Atrophic Vaginitis Atrophic vaginitis is a condition in which the tissues that line the vagina become dry and thin. This condition is most common in women who have stopped having regular menstrual periods (are in menopause). This usually starts when a woman is 34 to 44 years old. That is the time when a woman's estrogen levels begin to decrease. Estrogen is a female hormone. It helps to keep the tissues of the vagina moist. It stimulates the vagina to produce a clear fluid that lubricates the vagina for sex. This fluid also protects the vagina from infection. Lack of estrogen can cause the lining of the vagina to get thinner and dryer. The vagina may also shrink in size. It may become less elastic. Atrophic vaginitis tends to get worse over time as a woman's estrogen level drops. What are the causes? This condition is caused by the normal drop in estrogen that happens around the time of menopause. What increases the risk? Certain conditions or situations may lower a woman's estrogen level, leading to a higher risk for atrophic vaginitis. You are more likely to develop this condition if: You are taking medicines that block estrogen. You have had your ovaries removed. You are being treated for cancer with radiation or medicines (chemotherapy). You have given birth or are breastfeeding. You are older than age 75. You smoke. What are the signs or symptoms? Symptoms of this condition include: Pain,  soreness, a feeling of pressure, or bleeding during sex (dyspareunia). Vaginal burning, irritation, or itching. Pain or bleeding when a speculum is used in a vaginal exam. Having burning pain while urinating. Vaginal discharge. In some cases, there are no symptoms. How is this diagnosed? This condition is diagnosed based on your medical history and a physical exam. This will include a pelvic exam that checks the vaginal tissues. Though rare, you may also have other tests, including: A urine test. A test that checks the acid balance in your vagina (acid balance test). How is this treated? Treatment for this condition depends on how severe your symptoms are. Treatment may include: Using an over-the-counter vaginal lubricant before sex. Using a long-acting vaginal moisturizer. Using low-dose estrogen for moderate to severe symptoms that do not respond to other treatments. Options include creams, tablets, and inserts (vaginal rings). Before you use a vaginal estrogen, tell your health care provider if you have a history of: Breast cancer. Endometrial cancer. Blood clots. If you are not sexually active and your symptoms are very mild, you may not need treatment. Follow these instructions at home: Medicines Take over-the-counter and prescription medicines only as told by your health care provider. Do not use herbal or alternative medicines unless your health care provider says that you can. Use over-the-counter creams, lubricants, or moisturizers for dryness only as told by your health care provider. General instructions If your atrophic vaginitis is caused by menopause, discuss all of your menopause symptoms and treatment options with your health care  provider. Do not douche. Do not use products that can make your vagina dry. These include: Scented feminine sprays. Scented tampons. Scented soaps. Vaginal sex can help to improve blood flow and elasticity of vaginal tissue. If you choose to  have sex and it hurts, try using a water-soluble lubricant or moisturizer right before having sex. Contact a health care provider if: Your discharge looks different than normal. Your vagina has an unusual smell. You have new symptoms. Your symptoms do not improve with treatment. Your symptoms get worse. Summary Atrophic vaginitis is a condition in which the tissues that line the vagina become dry and thin. It is most common in women who have stopped having regular menstrual periods (are in menopause). Treatment options include using vaginal lubricants and low-dose vaginal estrogen. Contact a health care provider if your vagina has an unusual smell, or if your symptoms get worse or do not improve after treatment. This information is not intended to replace advice given to you by your health care provider. Make sure you discuss any questions you have with your health care provider. Document Revised: 02/06/2020 Document Reviewed: 02/06/2020 Elsevier Patient Education  2022 [employer].

## 2021-04-27 NOTE — Progress Notes (Signed)
GYNECOLOGY  VISIT   HPI: 45 y.o.   Single  African American  female   [id number] with No LMP recorded. Patient has had a hysterectomy.   here for atrophic vaginitis, hot flashes and mood swings.   Last period was 10 years ago.   Vaginal dryness for a long time per patient.  She tried Premarin vaginal estrogen cream which is helpful. She is using this once a day and she is filling the entire syringe.  This is from her PCP, [employer].   Her PCP just started her on ERT.  Her estradiol level was 60 on 04/09/21.  Patient tried OTC possible Estroven and this did not work.   Neg GC/CT.  Hx TIA,  SVT, hypertrophic cardiomyopathy  GYNECOLOGIC HISTORY: No LMP recorded. Patient has had a hysterectomy. Contraception:  Hyst Menopausal hormone therapy:  Estradiol patch Last mammogram: NEVER Last pap smear:   04-09-21 Neg        OB History     Gravida  0   Para  0   Term  0   Preterm  0   AB  0   Living  0      SAB  0   IAB  0   Ectopic  0   Multiple  0   Live Births  0              Patient Active Problem List   Diagnosis Date Noted   Obesity, Class III, BMI 40-49.9 (morbid obesity) (HCC) 10/08/2020   Chronic heart failure with preserved ejection fraction (HCC) 11/08/2019   H/O TIA (transient ischemic attack) and stroke 05/01/2019   Hypertrophic cardiomyopathy (HCC) 04/28/2019   SOB (shortness of breath) 09/26/2017   Essential hypertension 09/26/2017   Chest pain of uncertain etiology 09/26/2017   Elevated blood-pressure reading without diagnosis of hypertension 03/18/2015    Past Medical History:  Diagnosis Date   Anxiety    CHF (congestive heart failure) (HCC)    Hypertension     Past Surgical History:  Procedure Laterality Date   ABDOMINAL HYSTERECTOMY  08/23/2003   dermoid cyst    Current Outpatient Medications  Medication Sig Dispense Refill   Cholecalciferol (VITAMIN D3) 50 MCG (2000 UT) CHEW Chew 1 mcg by mouth daily.     diltiazem  (CARDIZEM CD) 180 MG 24 hr capsule TAKE 1 CAPSULE (180 MG TOTAL) BY MOUTH DAILY. 90 capsule 3   furosemide (LASIX) 20 MG tablet TAKE 1 TABLET (20 MG TOTAL) BY MOUTH 2 (TWO) TIMES DAILY. 180 tablet 3   metoprolol succinate (TOPROL-XL) 50 MG 24 hr tablet TAKE 1 TABLET (50 MG TOTAL) BY MOUTH DAILY. TAKE WITH OR IMMEDIATELY FOLLOWING A MEAL. 90 tablet 3   spironolactone (ALDACTONE) 25 MG tablet Take 1 tablet (25 mg total) by mouth daily. 90 tablet 3   No current facility-administered medications for this visit.     ALLERGIES: Patient has no known allergies.  Family History  Problem Relation Age of Onset   Cancer Mother 23       bone cancer   Hypertension Mother    Other Sister        Pipolar   Heart disease Paternal Uncle    Cancer Maternal Grandmother        Lung cancer    Social History   Socioeconomic History   Marital status: Single    Spouse name: Not on file   Number of children: 0   Years of education: Not on file  Highest education level: Not on file  Occupational History   Not on file  Tobacco Use   Smoking status: Never   Smokeless tobacco: Never  Vaping Use   Vaping Use: Never used  Substance and Sexual Activity   Alcohol use: Yes    Comment: 1 glass of wine every 3-4 months   Drug use: No   Sexual activity: Yes    Birth control/protection: Surgical    Comment: Hyst  Other Topics Concern   Not on file  Social History Narrative   Not on file   Social Determinants of Health   Financial Resource Strain: Not on file  Food Insecurity: Not on file  Transportation Needs: Not on file  Physical Activity: Not on file  Stress: Not on file  Social Connections: Not on file  Intimate Partner Violence: Not on file    Review of Systems  Genitourinary:  Positive for vaginal pain (vaginal dryness/?atrophic vaginitis due to menopause).  Psychiatric/Behavioral:  Positive for agitation.        Mood swings due to menopause  All other systems reviewed and are  negative.  PHYSICAL EXAMINATION:    BP 130/78   Pulse 70   Resp 14   Ht 5\' 5"  (1.651 m)   Wt 235 lb (106.6 kg)   BMI 39.11 kg/m     General appearance: alert, cooperative and appears stated age Head: Normocephalic, without obvious abnormality, atraumatic Lungs: clear to auscultation bilaterally Heart: regular rate and rhythm Abdomen: soft, non-tender, no masses,  no organomegaly Extremities: extremities normal, atraumatic, no cyanosis or edema Skin: Skin color, texture, turgor normal. No rashes or lesions No abnormal inguinal nodes palpated  Pelvic: External genitalia:  no lesions              Urethra:  normal appearing urethra with no masses, tenderness or lesions              Bartholins and Skenes: normal                 Vagina: normal appearing vagina with normal color and discharge, no lesions              Cervix: no lesions                Bimanual Exam:  Uterus:  normal size, contour, position, consistency, mobility, non-tender              Adnexa: no mass, fullness, tenderness               Chaperone was present for exam:  , CMA.  ASSESSMENT  Status post bilateral ovarian cystectomies for dermoid cysts.  Pathology report does not indicated hysterectomy.  Normal estradiol level on 04/09/21. Acute vaginitis.  Vaginal atrophy.  Overusing vaginal estrogen cream. Menopausal symptoms.  Recent ERT initiation.  Hx TIA. Cardiomyopathy.   PLAN  Stop ERT.  We discussed that she has risk of stroke, DVT, and PE.  Stop vaginal estrogen cream.   Try vitamin E vaginal suppositories or cooking oils for vaginal dryness.  Vaginitis testing.  Will get her operative reports from her ovarian surgery. She will update her mammogram. FU in 4 weeks.    An After Visit Summary was printed and given to the patient.

## 2021-04-28 LAB — CERVICOVAGINAL ANCILLARY ONLY
Bacterial Vaginitis (gardnerella): NEGATIVE
Candida Glabrata: NEGATIVE
Candida Vaginitis: NEGATIVE
Comment: NEGATIVE
Comment: NEGATIVE
Comment: NEGATIVE
Comment: NEGATIVE
Trichomonas: NEGATIVE

## 2021-05-10 ENCOUNTER — Other Ambulatory Visit: Payer: Self-pay

## 2021-05-10 MED FILL — Furosemide Tab 20 MG: ORAL | 90 days supply | Qty: 180 | Fill #2 | Status: AC

## 2021-05-10 MED FILL — Metoprolol Succinate Tab ER 24HR 50 MG (Tartrate Equiv): ORAL | 30 days supply | Qty: 30 | Fill #3 | Status: AC

## 2021-05-10 MED FILL — Diltiazem HCl Coated Beads Cap ER 24HR 180 MG: ORAL | 30 days supply | Qty: 30 | Fill #1 | Status: AC

## 2021-05-11 ENCOUNTER — Other Ambulatory Visit: Payer: Self-pay

## 2021-05-11 ENCOUNTER — Ambulatory Visit: Payer: Self-pay

## 2021-05-11 DIAGNOSIS — I422 Other hypertrophic cardiomyopathy: Secondary | ICD-10-CM

## 2021-05-12 ENCOUNTER — Ambulatory Visit: Payer: Self-pay | Admitting: Nurse Practitioner

## 2021-05-13 ENCOUNTER — Other Ambulatory Visit: Payer: Self-pay

## 2021-05-14 ENCOUNTER — Ambulatory Visit (INDEPENDENT_AMBULATORY_CARE_PROVIDER_SITE_OTHER): Payer: Self-pay | Admitting: Nurse Practitioner

## 2021-05-14 ENCOUNTER — Other Ambulatory Visit: Payer: Self-pay

## 2021-05-14 ENCOUNTER — Encounter: Payer: Self-pay | Admitting: Nurse Practitioner

## 2021-05-14 VITALS — BP 107/61 | HR 68 | Temp 97.9°F | Ht 64.0 in | Wt 232.0 lb

## 2021-05-14 DIAGNOSIS — Z6841 Body Mass Index (BMI) 40.0 and over, adult: Secondary | ICD-10-CM

## 2021-05-14 DIAGNOSIS — R6882 Decreased libido: Secondary | ICD-10-CM

## 2021-05-14 LAB — POCT GLYCOSYLATED HEMOGLOBIN (HGB A1C): Hemoglobin A1C: 5.2 % (ref 4.0–5.6)

## 2021-05-14 MED ORDER — ESTRADIOL 0.025 MG/24HR TD PTWK
0.0250 mg | MEDICATED_PATCH | TRANSDERMAL | 2 refills | Status: DC
  Filled 2021-05-14 – 2021-06-08 (×3): qty 4, 28d supply, fill #0
  Filled 2021-08-04: qty 4, 28d supply, fill #1

## 2021-05-14 NOTE — Patient Instructions (Signed)
You were seen today in the [facility] for reevaluation of symptoms. Labs were collected, results will be available via MyChart or, if abnormal, you will be contacted by clinic staff. You were prescribed medications, please take as directed. Please follow up in 3 mths for reevaluation.

## 2021-05-14 NOTE — Progress Notes (Signed)
[facility] Patient [facility] [address] [place], [place]  [address] Phone:  [phone]   Fax:  [phone] Subjective:   Patient ID: Lydia Thompson, female    DOB: 13-Feb-1976, 45 y.o.   MRN: [id number]  Chief Complaint  Patient presents with   Follow-up    4 week follow up;    HPI Lydia Thompson 45 y.o. female presents with history anxiety, CHF, decreased libido and hypertension to the [facility] for reevaluation of symptoms discussed in previous visit. Patient states that she completed follow up with Ob/Gyn and estrogen was discontinued. States that prior to discontinuation, she was noticing an improvement in libido and vaginal dryness after 2 doses. Requesting to restart medication, in addition to refills. Attempted cooking oil as recommended by Ob/Gyn, but it was ineffective. States that she continues to have vaginal dryness with decreased libido.   Denies adhering to a specific diet, but walks daily. Was prescribed Trulicity in the past to assist with weight loss, but never completed treatment due to cost. Informed by her cardiologist that weight fluctuation is related to water retention, has been making efforts to reduce salt intake.  Denies any chest pain, shortness of breath, HA or dizziness. Denies any blurred vision, nausea or vomiting. Denies any other complaints today.    Past Medical History:  Diagnosis Date   Anxiety    CHF (congestive heart failure) (HCC)    Hypertension     Past Surgical History:  Procedure Laterality Date   ABDOMINAL HYSTERECTOMY  08/23/2003   dermoid cyst    Family History  Problem Relation Age of Onset   Cancer Mother 46       bone cancer   Hypertension Mother    Other Sister        Pipolar   Heart disease Paternal Uncle    Cancer Maternal Grandmother        Lung cancer    Social History   Socioeconomic History   Marital status: Single    Spouse name: Not on file   Number of children: 0   Years of education: Not on file    Highest education level: Not on file  Occupational History   Not on file  Tobacco Use   Smoking status: Never   Smokeless tobacco: Never  Vaping Use   Vaping Use: Never used  Substance and Sexual Activity   Alcohol use: Yes    Comment: 1 glass of wine every 3-4 months   Drug use: No   Sexual activity: Yes    Birth control/protection: Surgical    Comment: Hyst  Other Topics Concern   Not on file  Social History Narrative   Not on file   Social Determinants of Health   Financial Resource Strain: Not on file  Food Insecurity: Not on file  Transportation Needs: Not on file  Physical Activity: Not on file  Stress: Not on file  Social Connections: Not on file  Intimate Partner Violence: Not on file    Outpatient Medications Prior to Visit  Medication Sig Dispense Refill   Cholecalciferol (VITAMIN D3) 50 MCG (2000 UT) CHEW Chew 1 mcg by mouth daily.     diltiazem (CARDIZEM CD) 180 MG 24 hr capsule TAKE 1 CAPSULE (180 MG TOTAL) BY MOUTH DAILY. 90 capsule 3   furosemide (LASIX) 20 MG tablet TAKE 1 TABLET (20 MG TOTAL) BY MOUTH 2 (TWO) TIMES DAILY. 180 tablet 3   metoprolol succinate (TOPROL-XL) 50 MG 24 hr tablet  TAKE 1 TABLET (50 MG TOTAL) BY MOUTH DAILY. TAKE WITH OR IMMEDIATELY FOLLOWING A MEAL. 90 tablet 3   spironolactone (ALDACTONE) 25 MG tablet Take 1 tablet (25 mg total) by mouth daily. 90 tablet 3   No facility-administered medications prior to visit.    No Known Allergies  Review of Systems  Constitutional:  Negative for chills, fever and malaise/fatigue.  Respiratory:  Negative for cough and shortness of breath.   Cardiovascular:  Negative for chest pain, palpitations and leg swelling.  Gastrointestinal:  Negative for abdominal pain, blood in stool, constipation, diarrhea, nausea and vomiting.  Genitourinary:        Vaginal dryness and decreased libido   Skin: Negative.   Neurological: Negative.   Psychiatric/Behavioral:  Negative for depression. The patient is  not nervous/anxious.   All other systems reviewed and are negative.     Objective:    Physical Exam Vitals reviewed.  Constitutional:      General: She is not in acute distress.    Appearance: Normal appearance.  HENT:     Head: Normocephalic.  Cardiovascular:     Rate and Rhythm: Normal rate and regular rhythm.     Pulses: Normal pulses.     Heart sounds: Normal heart sounds.     Comments: No obvious peripheral edema Pulmonary:     Effort: Pulmonary effort is normal.     Breath sounds: Normal breath sounds.  Genitourinary:    Comments: Exam deferred  Musculoskeletal:     Cervical back: Normal range of motion and neck supple.  Skin:    General: Skin is warm and dry.     Capillary Refill: Capillary refill takes less than 2 seconds.  Neurological:     General: No focal deficit present.     Mental Status: She is alert and oriented to person, place, and time.  Psychiatric:        Mood and Affect: Mood normal.        Behavior: Behavior normal.        Thought Content: Thought content normal.        Judgment: Judgment normal.    BP 107/61 (BP Location: Right Arm, Patient Position: Sitting)   Pulse 68   Temp 97.9 F (36.6 C)   Ht 5\' 4"  (1.626 m)   Wt 232 lb 0.4 oz (105.2 kg)   SpO2 98%   BMI 39.83 kg/m  Wt Readings from Last 3 Encounters:  05/14/21 232 lb 0.4 oz (105.2 kg)  04/27/21 235 lb (106.6 kg)  04/09/21 234 lb (106.1 kg)    Immunization History  Administered Date(s) Administered   Tdap 10/18/2017    Diabetic Foot Exam - Simple   No data filed     Lab Results  Component Value Date   TSH 1.470 12/13/2019   Lab Results  Component Value Date   WBC 10.7 10/08/2020   HGB 13.2 10/08/2020   HCT 40.1 10/08/2020   MCV 82 10/08/2020   PLT 263 10/08/2020   Lab Results  Component Value Date   NA 140 10/08/2020   K 4.2 10/08/2020   CO2 26 02/19/2020   GLUCOSE 75 10/08/2020   BUN 12 10/08/2020   CREATININE 0.88 10/08/2020   BILITOT 0.4 10/08/2020    ALKPHOS 65 10/08/2020   AST 15 10/08/2020   ALT 14 09/26/2018   PROT 7.3 10/08/2020   ALBUMIN 4.4 10/08/2020   CALCIUM 9.5 10/08/2020   ANIONGAP 9 02/19/2020   Lab Results  Component Value Date  CHOL 154 10/08/2020   CHOL 172 07/09/2020   CHOL 154 12/13/2019   Lab Results  Component Value Date   HDL 39 (L) 10/08/2020   HDL 37 (L) 07/09/2020   HDL 35 (L) 12/13/2019   Lab Results  Component Value Date   LDLCALC 102 (H) 10/08/2020   LDLCALC 123 (H) 07/09/2020   LDLCALC 102 (H) 12/13/2019   Lab Results  Component Value Date   TRIG 65 10/08/2020   TRIG 59 07/09/2020   TRIG 88 12/13/2019   Lab Results  Component Value Date   CHOLHDL 3.9 10/08/2020   CHOLHDL 4.6 (H) 07/09/2020   CHOLHDL 4.4 12/13/2019   Lab Results  Component Value Date   HGBA1C 5.2 05/14/2021   HGBA1C 5.0 09/26/2018   HGBA1C 5.4 10/18/2017       Assessment & Plan:   Problem List Items Addressed This Visit   None Visit Diagnoses     Decreased libido    -  Primary   Relevant Medications   estradiol (CLIMARA) 0.025 mg/24hr patch  Discussed at length risk of taking medications, patient demonstrated understanding and requested to restart   Discussed potential red flag adverse affects of medication that would require immediate discontinuation, demonstrated understanding  Encouraged continued diet and exercise efforts  Encouraged continued compliance with medication     Class 3 severe obesity with body mass index (BMI) of 45.0 to 49.9 in adult, unspecified obesity type, unspecified whether serious comorbidity present (HCC)       Relevant Orders   POCT glycosylated hemoglobin (Hb A1C) (Completed): 5.2, reassuring results.    Follow up in 3 mths for reevaluation of symptoms.    I am having Lydia Thompson maintain her Vitamin D3, furosemide, metoprolol succinate, diltiazem, spironolactone, and estradiol.  Meds ordered this encounter  Medications   estradiol (CLIMARA) 0.025 mg/24hr patch     Sig: Place 1 patch (0.025 mg total) onto the skin once a week.    Dispense:  4 patch    Refill:  2     Kathrynn Speed, NP

## 2021-05-17 ENCOUNTER — Other Ambulatory Visit: Payer: Self-pay

## 2021-05-18 ENCOUNTER — Ambulatory Visit: Payer: Self-pay | Admitting: Cardiology

## 2021-05-21 ENCOUNTER — Ambulatory Visit: Payer: Self-pay | Admitting: Cardiology

## 2021-05-24 ENCOUNTER — Other Ambulatory Visit: Payer: Self-pay

## 2021-05-27 NOTE — Progress Notes (Signed)
I can see her this mor    Patient is here for follow up visit.  Subjective:   Lydia Thompson, female    DOB: 01-25-1976, 45 y.o.   MRN: [id number]   Chief Complaint  Patient presents with   Cardiomyopathy   Chronic heart failure with preserved ejection fraction   Follow-up    6 month   Results    echo    45 year old African American female with hypertrophic cardiomyopathy, history of SVT, TIA, h/o COVID  She is doing well.  She she does not have any overt exertional chest pain or dyspnea symptoms.  Weight is more or less stable.  Blood pressure is well controlled.  Reviewed recent echocardiogram results with the patient, details below.   Current Outpatient Medications on File Prior to Visit  Medication Sig Dispense Refill   Cholecalciferol (VITAMIN D3) 50 MCG (2000 UT) CHEW Chew 1 mcg by mouth daily.     diltiazem (CARDIZEM CD) 180 MG 24 hr capsule TAKE 1 CAPSULE (180 MG TOTAL) BY MOUTH DAILY. 90 capsule 3   estradiol (CLIMARA) 0.025 mg/24hr patch Place 1 patch (0.025 mg total) onto the skin once a week. 4 patch 2   furosemide (LASIX) 20 MG tablet TAKE 1 TABLET (20 MG TOTAL) BY MOUTH 2 (TWO) TIMES DAILY. 180 tablet 3   metoprolol succinate (TOPROL-XL) 50 MG 24 hr tablet TAKE 1 TABLET (50 MG TOTAL) BY MOUTH DAILY. TAKE WITH OR IMMEDIATELY FOLLOWING A MEAL. 90 tablet 3   spironolactone (ALDACTONE) 25 MG tablet Take 1 tablet (25 mg total) by mouth daily. 90 tablet 3   [DISCONTINUED] isosorbide mononitrate (IMDUR) 30 MG 24 hr tablet Take 1 tablet (30 mg total) by mouth daily. 30 tablet 3   No current facility-administered medications on file prior to visit.    Cardiovascular studies:  EKG 05/28/2021: Sinus rhythm 81 bpm with sinus arrhtymia Left atrial enlargement Left ventricular hypertrophy Nonspecific ST depression, likely due to LVH  Echocardiogram 05/11/2021:  Normal LV systolic function with visual EF 60-65%. Left ventricle cavity  is normal in size. Severe left  ventricular hypertrophy. Peak LVOT gradient  71mHG. Normal global wall motion. Indeterminate diastolic filling  pattern, normal LAP.  Left atrial cavity is severely dilated.  Moderate (Grade II) mitral regurgitation, jet directed posteriorly.  Mild tricuspid regurgitation. No evidence of pulmonary hypertension.  Compared to study 05/12/2020 no significant change.   Coronary CT angiogram 10/17/2018: 1. Coronary calcium score of 0. This was 0 percentile for age and sex matched control. 2. Normal coronary origin with right dominance. 3. No evidence of CAD. 4. Moderately dilated pulmonary artery measuring 36 mm suggestive of pulmonary hypertension.    Exercise Treadmill Stress Test 01/12/2018:  Indication: SoB  The patient exercised on Bruce protocol for 4:00 min. Patient achieved  5.84 METS and reached HR  154 bpm, which is   85% of maximum age-predicted HR.  Stress test terminated due to 7/10 CP and Fatigue.  Exercise capacity was below average for age.  HR Response to Exercise: Exaggerated HR response. BP Response to Exercise: Normal resting BP- appropriate response. Chest Pain: limiting. Arrhythmias: none. ST Changes:  Rest: Sinus rhythm 90 bpm. 1-2 mm T wave inversions leads II, III, aVF Stress: Sinus tachycardia. >2 mm T wave inversions leads II, III, aVF, V5,V 6. <1 mm ST elevation lead aVR. ST changes persist 2 min into recovery.   Overall Impression: Positive stress test typical of diffuse subendocardial ischemia. Consider further cardiac workup.  Recent  labs: 07/09/2020: Glucose 79, BUN/Cr 12/0.82. EGFR 101. Na/K 136/4.4. Rest of the CMP normal Chol 172, TG 59, HDL 37, LDL 123  03/2020: H/H 13/41. MCV 81. Platelets 219  11/2019: TSH 1.4  Review of Systems  Cardiovascular:  Negative for chest pain, dyspnea on exertion, leg swelling, palpitations and syncope.      Objective:    Vitals:   05/28/21 1053  BP: 101/83  Pulse: 85  Resp: 16  Temp: (!) 85 F (29.4  C)  SpO2: 100%   Filed Weights   05/28/21 1053  Weight: 236 lb (107 kg)      Physical Exam Vitals and nursing note reviewed.  Constitutional:      General: She is not in acute distress. Neck:     Vascular: No JVD.  Cardiovascular:     Rate and Rhythm: Normal rate and regular rhythm.     Pulses: Intact distal pulses.     Heart sounds: Murmur (II/VI ESM RUSB) heard.  Pulmonary:     Effort: Pulmonary effort is normal.     Breath sounds: Normal breath sounds. No wheezing or rales.  Abdominal:     Tenderness: There is no rebound.  Musculoskeletal:     Right lower leg: No edema.     Left lower leg: No edema.        Assessment & Recommendations:   45 year old African American female with hypertrophic cardiomyopathy, history of SVT, TIA, h/o COVID  Hypertrophic cardiomyopathy (HCC) Septal thickness 2.1 cm. No indication for ICD. Continue metoprolol and diltiazem.  Chronic heart failure with preserved ejection fraction (HCC) Due to #2. Currently stable.  Tolerating low dose spironolactone. Recommend daily weights. Discussed importance of low salt diet.   H/o TIA: No recurrence. Continue Aspirin 81 mg daily.  F/u in 1 year  Nigel Mormon, MD [facility] Pager: [phone] Office: [phone] If no answer Cell [phone]

## 2021-05-28 ENCOUNTER — Other Ambulatory Visit: Payer: Self-pay

## 2021-05-28 ENCOUNTER — Encounter: Payer: Self-pay | Admitting: Cardiology

## 2021-05-28 ENCOUNTER — Ambulatory Visit: Payer: Self-pay | Admitting: Cardiology

## 2021-05-28 VITALS — BP 101/83 | HR 85 | Temp 98.5°F | Resp 16 | Ht 64.0 in | Wt 236.0 lb

## 2021-05-28 DIAGNOSIS — I422 Other hypertrophic cardiomyopathy: Secondary | ICD-10-CM

## 2021-05-28 DIAGNOSIS — Z8673 Personal history of transient ischemic attack (TIA), and cerebral infarction without residual deficits: Secondary | ICD-10-CM

## 2021-05-28 DIAGNOSIS — I10 Essential (primary) hypertension: Secondary | ICD-10-CM

## 2021-05-28 DIAGNOSIS — I5032 Chronic diastolic (congestive) heart failure: Secondary | ICD-10-CM

## 2021-05-28 MED ORDER — DILTIAZEM HCL ER COATED BEADS 180 MG PO CP24
ORAL_CAPSULE | ORAL | 3 refills | Status: DC
Start: 2021-05-28 — End: 2021-08-06
  Filled 2021-05-28 – 2021-05-31 (×2): qty 90, fill #0
  Filled 2021-06-13: qty 30, 30d supply, fill #0
  Filled 2021-07-12: qty 90, 90d supply, fill #1

## 2021-05-28 MED ORDER — FUROSEMIDE 20 MG PO TABS
20.0000 mg | ORAL_TABLET | Freq: Two times a day (BID) | ORAL | 3 refills | Status: DC | PRN
  Filled 2021-05-28 – 2021-11-02 (×4): qty 180, 90d supply, fill #0
  Filled 2022-02-24: qty 180, 90d supply, fill #1
  Filled 2022-04-11: qty 180, 90d supply, fill #2

## 2021-05-28 MED ORDER — SPIRONOLACTONE 25 MG PO TABS
25.0000 mg | ORAL_TABLET | Freq: Every day | ORAL | 3 refills | Status: DC
  Filled 2021-05-28 – 2021-05-31 (×2): qty 90, 90d supply, fill #0
  Filled 2021-06-13: qty 30, 30d supply, fill #0
  Filled 2021-08-04: qty 90, 90d supply, fill #1

## 2021-05-28 MED ORDER — ASPIRIN EC 81 MG PO TBEC
81.0000 mg | DELAYED_RELEASE_TABLET | Freq: Every day | ORAL | 3 refills | Status: DC
  Filled 2021-05-28 – 2021-05-31 (×2): qty 90, 90d supply, fill #0

## 2021-05-28 MED ORDER — METOPROLOL SUCCINATE ER 50 MG PO TB24
ORAL_TABLET | Freq: Every day | ORAL | 3 refills | Status: DC
  Filled 2021-05-28 – 2021-05-31 (×2): qty 90, fill #0
  Filled 2021-06-13: qty 30, 30d supply, fill #0
  Filled 2021-07-12: qty 90, 90d supply, fill #1

## 2021-05-31 ENCOUNTER — Other Ambulatory Visit: Payer: Self-pay

## 2021-06-03 ENCOUNTER — Ambulatory Visit: Payer: Self-pay | Admitting: Obstetrics and Gynecology

## 2021-06-07 ENCOUNTER — Other Ambulatory Visit: Payer: Self-pay

## 2021-06-08 ENCOUNTER — Other Ambulatory Visit: Payer: Self-pay

## 2021-06-14 ENCOUNTER — Other Ambulatory Visit: Payer: Self-pay

## 2021-07-12 ENCOUNTER — Other Ambulatory Visit: Payer: Self-pay

## 2021-07-12 ENCOUNTER — Ambulatory Visit: Payer: Self-pay | Admitting: Nurse Practitioner

## 2021-07-13 ENCOUNTER — Other Ambulatory Visit: Payer: Self-pay

## 2021-07-19 ENCOUNTER — Other Ambulatory Visit: Payer: Self-pay

## 2021-08-04 ENCOUNTER — Other Ambulatory Visit: Payer: Self-pay

## 2021-08-05 ENCOUNTER — Other Ambulatory Visit: Payer: Self-pay

## 2021-08-06 ENCOUNTER — Ambulatory Visit (INDEPENDENT_AMBULATORY_CARE_PROVIDER_SITE_OTHER): Payer: Self-pay | Admitting: Nurse Practitioner

## 2021-08-06 ENCOUNTER — Encounter: Payer: Self-pay | Admitting: Nurse Practitioner

## 2021-08-06 ENCOUNTER — Other Ambulatory Visit: Payer: Self-pay

## 2021-08-06 VITALS — BP 105/76 | HR 57 | Temp 98.0°F | Ht 64.0 in | Wt 231.6 lb

## 2021-08-06 DIAGNOSIS — Z6841 Body Mass Index (BMI) 40.0 and over, adult: Secondary | ICD-10-CM

## 2021-08-06 DIAGNOSIS — I422 Other hypertrophic cardiomyopathy: Secondary | ICD-10-CM

## 2021-08-06 DIAGNOSIS — R6882 Decreased libido: Secondary | ICD-10-CM

## 2021-08-06 MED ORDER — SAXENDA 18 MG/3ML ~~LOC~~ SOPN: PEN_INJECTOR | SUBCUTANEOUS | 0 refills | Status: AC

## 2021-08-06 MED ORDER — DILTIAZEM HCL ER COATED BEADS 180 MG PO CP24
ORAL_CAPSULE | ORAL | 3 refills | Status: DC
  Filled 2021-08-06: qty 90, fill #0
  Filled 2021-10-18: qty 87, 87d supply, fill #0
  Filled 2021-10-18: qty 3, 3d supply, fill #0
  Filled 2022-01-09: qty 90, 90d supply, fill #1
  Filled 2022-02-24: qty 90, 90d supply, fill #2
  Filled 2022-04-11: qty 30, 30d supply, fill #2
  Filled 2022-05-11: qty 30, 30d supply, fill #3

## 2021-08-06 MED ORDER — SPIRONOLACTONE 25 MG PO TABS
25.0000 mg | ORAL_TABLET | Freq: Every day | ORAL | 3 refills | Status: DC
  Filled 2021-11-02: qty 90, 90d supply, fill #0
  Filled 2022-01-31: qty 90, 90d supply, fill #1
  Filled 2022-04-11: qty 90, 90d supply, fill #2
  Filled 2022-05-11: qty 30, 30d supply, fill #3

## 2021-08-06 MED ORDER — METOPROLOL SUCCINATE ER 50 MG PO TB24
ORAL_TABLET | Freq: Every day | ORAL | 3 refills | Status: DC
  Filled 2021-08-06: qty 90, fill #0
  Filled 2021-10-18: qty 90, 90d supply, fill #0
  Filled 2022-01-09: qty 90, 90d supply, fill #1
  Filled 2022-04-11: qty 90, 90d supply, fill #2
  Filled 2022-05-11: qty 30, 30d supply, fill #3

## 2021-08-06 MED ORDER — ESTRADIOL 0.025 MG/24HR TD PTWK
0.0250 mg | MEDICATED_PATCH | TRANSDERMAL | 2 refills | Status: DC
  Filled 2021-10-18: qty 4, 28d supply, fill #0
  Filled 2022-01-25: qty 4, 28d supply, fill #1
  Filled 2022-05-11: qty 4, 28d supply, fill #2

## 2021-08-06 NOTE — Patient Instructions (Signed)
You were seen today in the [facility] for reevaluation of libido and weight loss.  You were prescribed medications, please take as directed. Please follow up in 3 mths for reevaluation.

## 2021-08-06 NOTE — Progress Notes (Signed)
[facility] Patient [name] [address] [place], [place]  [address] Phone:  [phone]   Fax:  [phone] Subjective:   Patient ID: Lydia Thompson, female    DOB: [birth year], 44 y.o.   MRN: [id number]  Chief Complaint  Patient presents with   Follow-up    3 month follow up,    HPI Lydia Thompson 45 y.o. female  has a past medical history of Anxiety, CHF (congestive heart failure) (HCC), and Hypertension. To the [facility] for reevaluation of chronic illness.  Patient states that she has been compliant with all medications. Has felt less anxious recently and more rested. States that she recently traveled to [place] for vacation. States estrogen replacement has been effective in managing vaginal dryness, patient has been able have intercourse with husband x 2, since starting medication. Denies any pain or other side effects since starting medication. Requesting refill.  Patient also requesting new prescription for Saxenda. Was prescribed medication in the past , but never took medication due to cost . States that she monitors diet at home and exercises regularly.   Denies any other complaints today. Denies any fever. Denies any fatigue, chest pain, shortness of breath, HA or dizziness. Denies any blurred vision, numbness or tingling.  Past Medical History:  Diagnosis Date   Anxiety    CHF (congestive heart failure) (HCC)    Hypertension     Past Surgical History:  Procedure Laterality Date   ABDOMINAL HYSTERECTOMY  08/23/2003   dermoid cyst    Family History  Problem Relation Age of Onset   Cancer Mother 31       bone cancer   Hypertension Mother    Other Sister        Pipolar   Heart disease Paternal Uncle    Cancer Maternal Grandmother        Lung cancer    Social History   Socioeconomic History   Marital status: Single    Spouse name: Not on file   Number of children: 0   Years of education: Not on file   Highest education level: Not on file  Occupational  History   Not on file  Tobacco Use   Smoking status: Never   Smokeless tobacco: Never  Vaping Use   Vaping Use: Never used  Substance and Sexual Activity   Alcohol use: Yes    Comment: 1 glass of wine every 3-4 months   Drug use: No   Sexual activity: Yes    Birth control/protection: Surgical    Comment: Hyst  Other Topics Concern   Not on file  Social History Narrative   Not on file   Social Determinants of Health   Financial Resource Strain: Not on file  Food Insecurity: Not on file  Transportation Needs: Not on file  Physical Activity: Not on file  Stress: Not on file  Social Connections: Not on file  Intimate Partner Violence: Not on file    Outpatient Medications Prior to Visit  Medication Sig Dispense Refill   aspirin EC 81 MG tablet Take 1 tablet (81 mg total) by mouth daily. 90 tablet 3   Cholecalciferol (VITAMIN D3) 50 MCG (2000 UT) CHEW Chew 1 mcg by mouth daily.     furosemide (LASIX) 20 MG tablet Take 1 tablet (20 mg total) by mouth 2 (two) times daily as needed. 180 tablet 3   diltiazem (CARDIZEM CD) 180 MG 24 hr capsule TAKE 1 CAPSULE (180 MG TOTAL) BY MOUTH DAILY.  90 capsule 3   estradiol (CLIMARA) 0.025 mg/24hr patch Place 1 patch (0.025 mg total) onto the skin once a week. 4 patch 2   metoprolol succinate (TOPROL-XL) 50 MG 24 hr tablet TAKE 1 TABLET (50 MG TOTAL) BY MOUTH DAILY. TAKE WITH OR IMMEDIATELY FOLLOWING A MEAL. 90 tablet 3   spironolactone (ALDACTONE) 25 MG tablet Take 1 tablet (25 mg total) by mouth daily. 90 tablet 3   No facility-administered medications prior to visit.    No Known Allergies  Review of Systems  Constitutional:  Negative for chills, fever and malaise/fatigue.  Respiratory:  Negative for cough and shortness of breath.   Cardiovascular:  Negative for chest pain, palpitations and leg swelling.  Gastrointestinal:  Negative for abdominal pain, blood in stool, constipation, diarrhea, nausea and vomiting.  Genitourinary:         See HPI  Musculoskeletal: Negative.   Skin: Negative.   Psychiatric/Behavioral:  Negative for depression. The patient is not nervous/anxious.   All other systems reviewed and are negative.     Objective:    Physical Exam Vitals reviewed.  Constitutional:      General: She is not in acute distress.    Appearance: Normal appearance. She is obese.  HENT:     Head: Normocephalic.  Cardiovascular:     Rate and Rhythm: Normal rate and regular rhythm.     Pulses: Normal pulses.     Heart sounds: Normal heart sounds.     Comments: No obvious peripheral edema Pulmonary:     Effort: Pulmonary effort is normal.     Breath sounds: Normal breath sounds.  Skin:    General: Skin is warm and dry.     Capillary Refill: Capillary refill takes less than 2 seconds.  Neurological:     General: No focal deficit present.     Mental Status: She is alert and oriented to person, place, and time.  Psychiatric:        Mood and Affect: Mood normal.        Behavior: Behavior normal.        Thought Content: Thought content normal.        Judgment: Judgment normal.    BP 105/76 (BP Location: Right Arm, Patient Position: Sitting, Cuff Size: Large)    Pulse (!) 57    Temp 98 F (36.7 C) (Temporal)    Ht 5\' 4"  (1.626 m)    Wt 231 lb 9.6 oz (105.1 kg)    SpO2 100%    BMI 39.75 kg/m  Wt Readings from Last 3 Encounters:  08/06/21 231 lb 9.6 oz (105.1 kg)  05/28/21 236 lb (107 kg)  05/14/21 232 lb 0.4 oz (105.2 kg)    Immunization History  Administered Date(s) Administered   Tdap 10/18/2017    Diabetic Foot Exam - Simple   No data filed     Lab Results  Component Value Date   TSH 1.470 12/13/2019   Lab Results  Component Value Date   WBC 10.7 10/08/2020   HGB 13.2 10/08/2020   HCT 40.1 10/08/2020   MCV 82 10/08/2020   PLT 263 10/08/2020   Lab Results  Component Value Date   NA 140 10/08/2020   K 4.2 10/08/2020   CO2 26 02/19/2020   GLUCOSE 75 10/08/2020   BUN 12 10/08/2020    CREATININE 0.88 10/08/2020   BILITOT 0.4 10/08/2020   ALKPHOS 65 10/08/2020   AST 15 10/08/2020   ALT 14 09/26/2018   PROT 7.3  10/08/2020   ALBUMIN 4.4 10/08/2020   CALCIUM 9.5 10/08/2020   ANIONGAP 9 02/19/2020   Lab Results  Component Value Date   CHOL 154 10/08/2020   CHOL 172 07/09/2020   CHOL 154 12/13/2019   Lab Results  Component Value Date   HDL 39 (L) 10/08/2020   HDL 37 (L) 07/09/2020   HDL 35 (L) 12/13/2019   Lab Results  Component Value Date   LDLCALC 102 (H) 10/08/2020   LDLCALC 123 (H) 07/09/2020   LDLCALC 102 (H) 12/13/2019   Lab Results  Component Value Date   TRIG 65 10/08/2020   TRIG 59 07/09/2020   TRIG 88 12/13/2019   Lab Results  Component Value Date   CHOLHDL 3.9 10/08/2020   CHOLHDL 4.6 (H) 07/09/2020   CHOLHDL 4.4 12/13/2019   Lab Results  Component Value Date   HGBA1C 5.2 05/14/2021   HGBA1C 5.0 09/26/2018   HGBA1C 5.4 10/18/2017       Assessment & Plan:   Problem List Items Addressed This Visit       Cardiovascular and Mediastinum   Hypertrophic cardiomyopathy (HCC)   Relevant Medications   diltiazem (CARDIZEM CD) 180 MG 24 hr capsule   metoprolol succinate (TOPROL-XL) 50 MG 24 hr tablet   spironolactone (ALDACTONE) 25 MG tablet All medications refilled   Other Visit Diagnoses     Decreased libido    -  Primary   Relevant Medications   estradiol (CLIMARA) 0.025 mg/24hr patch Discussed potential side effects   Class 3 severe obesity with body mass index (BMI) of 45.0 to 49.9 in adult, unspecified obesity type, unspecified whether serious comorbidity present (HCC)       Long discussion about weight patient wants to move forward with medication Encouraged continued diet and exercise efforts   Relevant Medications   Liraglutide -Weight Management (SAXENDA) 18 MG/3ML SOPN   Follow up in 3 mths for reevaluation of weight loss, vaginal dryness and decreased libido, sooner as needed    I am having Maryan M. Depolo maintain  her Vitamin D3, aspirin EC, furosemide, estradiol, Saxenda, diltiazem, metoprolol succinate, and spironolactone.  Meds ordered this encounter  Medications   estradiol (CLIMARA) 0.025 mg/24hr patch    Sig: Place 1 patch (0.025 mg total) onto the skin once a week.    Dispense:  4 patch    Refill:  2   Liraglutide -Weight Management (SAXENDA) 18 MG/3ML SOPN    Sig: Inject 0.6 mg into the skin daily for 7 days, THEN 1.2 mg daily for 7 days, THEN 1.8 mg daily.    Dispense:  11.1 mL    Refill:  0   diltiazem (CARDIZEM CD) 180 MG 24 hr capsule    Sig: TAKE 1 CAPSULE (180 MG TOTAL) BY MOUTH DAILY.    Dispense:  90 capsule    Refill:  3   metoprolol succinate (TOPROL-XL) 50 MG 24 hr tablet    Sig: TAKE 1 TABLET (50 MG TOTAL) BY MOUTH DAILY. TAKE WITH OR IMMEDIATELY FOLLOWING A MEAL.    Dispense:  90 tablet    Refill:  3   spironolactone (ALDACTONE) 25 MG tablet    Sig: Take 1 tablet (25 mg total) by mouth daily.    Dispense:  90 tablet    Refill:  3     Kathrynn Speed, NP

## 2021-08-13 ENCOUNTER — Ambulatory Visit: Payer: Self-pay | Admitting: Nurse Practitioner

## 2021-10-18 ENCOUNTER — Other Ambulatory Visit: Payer: Self-pay

## 2021-10-19 ENCOUNTER — Other Ambulatory Visit: Payer: Self-pay

## 2021-10-20 ENCOUNTER — Other Ambulatory Visit: Payer: Self-pay

## 2021-11-02 ENCOUNTER — Other Ambulatory Visit: Payer: Self-pay

## 2021-11-04 ENCOUNTER — Other Ambulatory Visit: Payer: Self-pay

## 2021-11-05 ENCOUNTER — Ambulatory Visit: Payer: Self-pay | Admitting: Nurse Practitioner

## 2022-01-10 ENCOUNTER — Other Ambulatory Visit: Payer: Self-pay

## 2022-01-14 ENCOUNTER — Other Ambulatory Visit: Payer: Self-pay

## 2022-01-19 ENCOUNTER — Other Ambulatory Visit: Payer: Self-pay

## 2022-01-25 ENCOUNTER — Other Ambulatory Visit: Payer: Self-pay

## 2022-01-25 ENCOUNTER — Other Ambulatory Visit: Payer: Self-pay | Admitting: Nurse Practitioner

## 2022-01-25 DIAGNOSIS — N952 Postmenopausal atrophic vaginitis: Secondary | ICD-10-CM

## 2022-01-26 ENCOUNTER — Other Ambulatory Visit: Payer: Self-pay

## 2022-01-31 ENCOUNTER — Other Ambulatory Visit: Payer: Self-pay

## 2022-02-01 ENCOUNTER — Other Ambulatory Visit: Payer: Self-pay

## 2022-02-24 ENCOUNTER — Other Ambulatory Visit: Payer: Self-pay

## 2022-03-01 ENCOUNTER — Other Ambulatory Visit: Payer: Self-pay

## 2022-03-31 ENCOUNTER — Ambulatory Visit: Payer: Self-pay | Admitting: Nurse Practitioner

## 2022-04-12 ENCOUNTER — Other Ambulatory Visit: Payer: Self-pay

## 2022-04-13 ENCOUNTER — Other Ambulatory Visit: Payer: Self-pay

## 2022-05-11 ENCOUNTER — Other Ambulatory Visit: Payer: Self-pay

## 2022-05-13 ENCOUNTER — Other Ambulatory Visit: Payer: Self-pay

## 2022-05-25 ENCOUNTER — Ambulatory Visit: Payer: Self-pay

## 2022-05-25 DIAGNOSIS — I422 Other hypertrophic cardiomyopathy: Secondary | ICD-10-CM

## 2022-05-27 ENCOUNTER — Other Ambulatory Visit: Payer: Self-pay

## 2022-06-02 ENCOUNTER — Ambulatory Visit: Payer: Self-pay | Admitting: Cardiology

## 2022-06-02 ENCOUNTER — Encounter: Payer: Self-pay | Admitting: Cardiology

## 2022-06-02 ENCOUNTER — Other Ambulatory Visit: Payer: Self-pay

## 2022-06-02 VITALS — BP 123/86 | HR 82 | Temp 98.0°F | Resp 16 | Ht 64.0 in | Wt 236.0 lb

## 2022-06-02 DIAGNOSIS — I10 Essential (primary) hypertension: Secondary | ICD-10-CM

## 2022-06-02 DIAGNOSIS — I422 Other hypertrophic cardiomyopathy: Secondary | ICD-10-CM

## 2022-06-02 DIAGNOSIS — Z8673 Personal history of transient ischemic attack (TIA), and cerebral infarction without residual deficits: Secondary | ICD-10-CM

## 2022-06-02 MED ORDER — SPIRONOLACTONE 25 MG PO TABS
25.0000 mg | ORAL_TABLET | Freq: Every day | ORAL | 3 refills | Status: DC
  Filled 2022-06-02 – 2022-08-04 (×3): qty 90, 90d supply, fill #0

## 2022-06-02 MED ORDER — DILTIAZEM HCL ER 180 MG PO CP24
180.0000 mg | ORAL_CAPSULE | Freq: Every day | ORAL | 3 refills | Status: DC
  Filled 2022-06-02 – 2022-06-14 (×2): qty 90, 90d supply, fill #0
  Filled 2022-06-14: qty 60, 60d supply, fill #0

## 2022-06-02 MED ORDER — METOPROLOL SUCCINATE ER 50 MG PO TB24
ORAL_TABLET | Freq: Every day | ORAL | 3 refills | Status: DC
  Filled 2022-06-02: qty 90, fill #0
  Filled 2022-07-19: qty 90, 90d supply, fill #0

## 2022-06-02 MED ORDER — ASPIRIN 81 MG PO TBEC: 81.0000 mg | DELAYED_RELEASE_TABLET | Freq: Every day | ORAL | 3 refills | Status: DC

## 2022-06-02 NOTE — Progress Notes (Signed)
Patient is here for follow up visit.  Subjective:   Lydia Thompson, female    DOB: 22-Jun-1976, 46 y.o.   MRN: [id number]   Chief Complaint  Patient presents with   Cardiomyopathy   Follow-up    46 year old African American female with hypertrophic cardiomyopathy, history of SVT, TIA, h/o COVID  Patient is here with her friend today.  Few weeks ago, she experienced exertional dyspnea with walking more than usual-+ 3 flights of stairs and walking up to 20 minutes.  With her usual activity, she has not had any dyspnea symptoms.  Discussed recent echocardiogram results with the patient, discussed with the patient in detail.   Current Outpatient Medications:    aspirin EC 81 MG tablet, Take 1 tablet (81 mg total) by mouth daily., Disp: 90 tablet, Rfl: 3   Cholecalciferol (VITAMIN D3) 50 MCG (2000 UT) CHEW, Chew 1 mcg by mouth daily., Disp: , Rfl:    diltiazem (CARDIZEM CD) 180 MG 24 hr capsule, TAKE 1 CAPSULE (180 MG TOTAL) BY MOUTH DAILY., Disp: 90 capsule, Rfl: 3   estradiol (CLIMARA) 0.025 mg/24hr patch, Place 1 patch (0.025 mg total) onto the skin once a week., Disp: 4 patch, Rfl: 2   furosemide (LASIX) 20 MG tablet, Take 1 tablet (20 mg total) by mouth 2 (two) times daily as needed., Disp: 180 tablet, Rfl: 3   metoprolol succinate (TOPROL-XL) 50 MG 24 hr tablet, TAKE 1 TABLET (50 MG TOTAL) BY MOUTH DAILY. TAKE WITH OR IMMEDIATELY FOLLOWING A MEAL., Disp: 90 tablet, Rfl: 3   spironolactone (ALDACTONE) 25 MG tablet, Take 1 tablet (25 mg total) by mouth daily., Disp: 90 tablet, Rfl: 3  Cardiovascular studies:  EKG 06/02/2022: Sinus rhythm 76 bpm Incomplete left bundle branch block Left atrial enlargement  Echocardiogram 05/25/2022:  Normal LV systolic function with EF 61%. Left ventricle cavity is normal  in size. Severe concentric remodeling of the left ventricle. Normal global  wall motion. Normal diastolic filling pattern. Calculated EF 61%.  Left atrial cavity is  moderately dilated.  Moderate (Grade II) mitral regurgitation. Mild mitral valve leaflet  thickening.  Structurally normal tricuspid valve.  Mild tricuspid regurgitation. No  evidence of pulmonary hypertension.  Compared to the study done on 05/11/2021, no significant change.    Previously noted LVOT gradient of 13 mmHg is not present.   Coronary CT angiogram 10/17/2018: 1. Coronary calcium score of 0. This was 0 percentile for age and sex matched control. 2. Normal coronary origin with right dominance. 3. No evidence of CAD. 4. Moderately dilated pulmonary artery measuring 36 mm suggestive of pulmonary hypertension.    Exercise Treadmill Stress Test 01/12/2018:  Indication: SoB  The patient exercised on Bruce protocol for 4:00 min. Patient achieved  5.84 METS and reached HR  154 bpm, which is   85% of maximum age-predicted HR.  Stress test terminated due to 7/10 CP and Fatigue.  Exercise capacity was below average for age.  HR Response to Exercise: Exaggerated HR response. BP Response to Exercise: Normal resting BP- appropriate response. Chest Pain: limiting. Arrhythmias: none. ST Changes:  Rest: Sinus rhythm 90 bpm. 1-2 mm T wave inversions leads II, III, aVF Stress: Sinus tachycardia. >2 mm T wave inversions leads II, III, aVF, V5,V 6. <1 mm ST elevation lead aVR. ST changes persist 2 min into recovery.   Overall Impression: Positive stress test typical of diffuse subendocardial ischemia. Consider further cardiac workup.  Recent labs: 07/09/2020: Glucose 79, BUN/Cr 12/0.82. EGFR  101. Na/K 136/4.4. Rest of the CMP normal Chol 172, TG 59, HDL 37, LDL 123  03/2020: H/H 13/41. MCV 81. Platelets 219  11/2019: TSH 1.4  Review of Systems  Cardiovascular:  Negative for chest pain, dyspnea on exertion, leg swelling, palpitations and syncope.       Objective:    Vitals:   06/02/22 1142  BP: 123/86  Pulse: 82  Resp: 16  Temp: 98 F (36.7 C)  SpO2: 100%   Filed  Weights   06/02/22 1142  Weight: 236 lb (107 kg)      Physical Exam Vitals and nursing note reviewed.  Constitutional:      General: She is not in acute distress. Neck:     Vascular: No JVD.  Cardiovascular:     Rate and Rhythm: Normal rate and regular rhythm.     Pulses: Intact distal pulses.     Heart sounds: Murmur (II/VI ESM RUSB) heard.  Pulmonary:     Effort: Pulmonary effort is normal.     Breath sounds: Normal breath sounds. No wheezing or rales.  Abdominal:     Tenderness: There is no rebound.  Musculoskeletal:     Right lower leg: No edema.     Left lower leg: No edema.         Assessment & Recommendations:   46 year old African American female with hypertrophic cardiomyopathy, history of SVT, TIA, h/o COVID  Hypertrophic cardiomyopathy ([place]) Well controlled.  Continue metoprolol and diltiazem. Recent episode of dyspnea on exertion likely due to deconditioning. If any worsening, will check BNP and treadmill stress test.   Chronic heart failure with preserved ejection fraction (HCC) Due to #2. Currently stable.  Tolerating low dose spironolactone. Recommend daily weights. Discussed importance of low salt diet.   H/o TIA: No recurrence. Continue Aspirin 81 mg daily.  F/u in 1 year  Nigel Mormon, MD [facility] Pager: [phone] Office: [phone] If no answer Cell [phone]

## 2022-06-14 ENCOUNTER — Other Ambulatory Visit: Payer: Self-pay

## 2022-06-16 ENCOUNTER — Other Ambulatory Visit: Payer: Self-pay

## 2022-06-22 ENCOUNTER — Other Ambulatory Visit (HOSPITAL_COMMUNITY): Payer: Self-pay

## 2022-06-22 ENCOUNTER — Other Ambulatory Visit: Payer: Self-pay

## 2022-06-22 DIAGNOSIS — J209 Acute bronchitis, unspecified: Secondary | ICD-10-CM | POA: Insufficient documentation

## 2022-06-22 DIAGNOSIS — R059 Cough, unspecified: Secondary | ICD-10-CM | POA: Insufficient documentation

## 2022-06-22 DIAGNOSIS — N952 Postmenopausal atrophic vaginitis: Secondary | ICD-10-CM | POA: Insufficient documentation

## 2022-06-22 MED ORDER — BENZONATATE 100 MG PO CAPS
100.0000 mg | ORAL_CAPSULE | Freq: Three times a day (TID) | ORAL | 0 refills | Status: DC | PRN
  Filled 2022-06-22 (×2): qty 15, 5d supply, fill #0

## 2022-06-22 MED ORDER — PREMARIN 0.625 MG/GM VA CREA
0.5000 g | TOPICAL_CREAM | Freq: Every day | VAGINAL | 3 refills | Status: DC
  Filled 2022-07-27 – 2022-09-22 (×3): qty 30, 60d supply, fill #0
  Filled 2022-10-06: qty 30, 15d supply, fill #0

## 2022-06-22 MED ORDER — ALBUTEROL SULFATE HFA 108 (90 BASE) MCG/ACT IN AERS
2.0000 | INHALATION_SPRAY | RESPIRATORY_TRACT | 0 refills | Status: DC | PRN
  Filled 2022-06-22: qty 6.7, 16d supply, fill #0
  Filled 2022-06-22: qty 6.7, 17d supply, fill #0

## 2022-06-22 MED ORDER — AZITHROMYCIN 250 MG PO TABS
ORAL_TABLET | ORAL | 0 refills | Status: DC
  Filled 2022-06-22 (×2): qty 6, 5d supply, fill #0

## 2022-07-19 ENCOUNTER — Other Ambulatory Visit: Payer: Self-pay

## 2022-07-22 ENCOUNTER — Other Ambulatory Visit: Payer: Self-pay

## 2022-07-27 ENCOUNTER — Other Ambulatory Visit: Payer: Self-pay | Admitting: Cardiology

## 2022-07-27 ENCOUNTER — Other Ambulatory Visit: Payer: Self-pay

## 2022-07-27 ENCOUNTER — Other Ambulatory Visit (HOSPITAL_COMMUNITY): Payer: Self-pay

## 2022-07-27 DIAGNOSIS — I5032 Chronic diastolic (congestive) heart failure: Secondary | ICD-10-CM

## 2022-07-27 DIAGNOSIS — I10 Essential (primary) hypertension: Secondary | ICD-10-CM

## 2022-07-27 DIAGNOSIS — I422 Other hypertrophic cardiomyopathy: Secondary | ICD-10-CM

## 2022-07-27 MED ORDER — FUROSEMIDE 20 MG PO TABS
20.0000 mg | ORAL_TABLET | Freq: Two times a day (BID) | ORAL | 3 refills | Status: DC | PRN
  Filled 2022-07-27 – 2022-09-22 (×2): qty 180, 90d supply, fill #0
  Filled 2023-01-10: qty 180, 90d supply, fill #1
  Filled 2023-02-16 – 2023-03-25 (×2): qty 60, 30d supply, fill #2
  Filled 2023-05-05: qty 60, 30d supply, fill #3
  Filled 2023-06-14: qty 60, 30d supply, fill #4
  Filled ????-??-??: fill #5

## 2022-08-02 ENCOUNTER — Other Ambulatory Visit: Payer: Self-pay

## 2022-08-04 ENCOUNTER — Other Ambulatory Visit: Payer: Self-pay

## 2022-08-05 ENCOUNTER — Other Ambulatory Visit (HOSPITAL_COMMUNITY): Payer: Self-pay

## 2022-08-26 ENCOUNTER — Telehealth: Payer: Self-pay | Admitting: Family Medicine

## 2022-08-26 DIAGNOSIS — Z91199 Patient's noncompliance with other medical treatment and regimen due to unspecified reason: Secondary | ICD-10-CM

## 2022-08-26 NOTE — Progress Notes (Signed)
The patient no-showed for appointment despite this provider sending direct link, reaching out via phone with no response and waiting for at least 10 minutes from appointment time for patient to join. They will be marked as a NS for this appointment/time.   Lydia Thompson M Lydia Tulloch, NP    

## 2022-08-30 ENCOUNTER — Ambulatory Visit (INDEPENDENT_AMBULATORY_CARE_PROVIDER_SITE_OTHER): Payer: Self-pay | Admitting: Internal Medicine

## 2022-08-30 ENCOUNTER — Encounter: Payer: Self-pay | Admitting: Internal Medicine

## 2022-08-30 ENCOUNTER — Other Ambulatory Visit: Payer: Self-pay

## 2022-08-30 VITALS — BP 116/75 | HR 60 | Temp 97.7°F | Ht 64.0 in | Wt 232.0 lb

## 2022-08-30 DIAGNOSIS — I422 Other hypertrophic cardiomyopathy: Secondary | ICD-10-CM

## 2022-08-30 DIAGNOSIS — Z5989 Other problems related to housing and economic circumstances: Secondary | ICD-10-CM

## 2022-08-30 DIAGNOSIS — Z8673 Personal history of transient ischemic attack (TIA), and cerebral infarction without residual deficits: Secondary | ICD-10-CM

## 2022-08-30 DIAGNOSIS — I5189 Other ill-defined heart diseases: Secondary | ICD-10-CM | POA: Insufficient documentation

## 2022-08-30 DIAGNOSIS — I2721 Secondary pulmonary arterial hypertension: Secondary | ICD-10-CM | POA: Insufficient documentation

## 2022-08-30 DIAGNOSIS — R42 Dizziness and giddiness: Secondary | ICD-10-CM

## 2022-08-30 DIAGNOSIS — N951 Menopausal and female climacteric states: Secondary | ICD-10-CM

## 2022-08-30 DIAGNOSIS — R6882 Decreased libido: Secondary | ICD-10-CM

## 2022-08-30 DIAGNOSIS — R110 Nausea: Secondary | ICD-10-CM

## 2022-08-30 HISTORY — DX: Decreased libido: R68.82

## 2022-08-30 HISTORY — DX: Menopausal and female climacteric states: N95.1

## 2022-08-30 MED ORDER — ESTRADIOL 0.1 MG/GM VA CREA
TOPICAL_CREAM | VAGINAL | 1 refills | Status: DC
  Filled 2022-08-30: qty 42.5, 30d supply, fill #0
  Filled 2022-10-06: qty 42.5, 30d supply, fill #1

## 2022-08-30 MED ORDER — MECLIZINE HCL 25 MG PO TABS
25.0000 mg | ORAL_TABLET | Freq: Three times a day (TID) | ORAL | 1 refills | Status: DC | PRN
  Filled 2022-08-30: qty 30, 10d supply, fill #0

## 2022-08-30 MED ORDER — DULOXETINE HCL 30 MG PO CPEP
30.0000 mg | ORAL_CAPSULE | Freq: Every day | ORAL | 3 refills | Status: DC
  Filled 2022-08-30: qty 90, 90d supply, fill #0

## 2022-08-30 MED ORDER — ONDANSETRON 4 MG PO TBDP
4.0000 mg | ORAL_TABLET | Freq: Three times a day (TID) | ORAL | 0 refills | Status: DC | PRN
  Filled 2022-08-30: qty 20, 7d supply, fill #0

## 2022-08-30 NOTE — Assessment & Plan Note (Signed)
Encouraged continued C

## 2022-08-30 NOTE — Assessment & Plan Note (Signed)
Gave multiple handouts, suspect was transient benign paroxysmal positional vertigo (BPPV) Explained use of meclizine and Zofran and gave handouts and prescription for those, in case it returns.

## 2022-08-30 NOTE — Patient Instructions (Addendum)
It was a pleasure seeing you today!  Your health and satisfaction are my top priorities. If you believe your experience today was worthy of a 5-star rating, I'd be grateful for your feedback! Loralee Pacas, MD   CHECKOUT CHECKLIST  []    Schedule next appointment(s):    No follow-ups on file.  Any requested lab visits should be scheduled as appointments too  If you are not doing well:  Return to the office sooner Please bring all your medicine bottles to each appointment If your condition begins to worsen or become severe:  go to the emergency room or even call 911  []    Sign release of information authorizations: Any records we need for your care and to be your medical home  []    Recommmend take 1000 mcg B12 daily due to vertigo and heart failure.  []   (Optional):  Review your clinical notes on MyChart after they are completed.     Today's draft of the physician documented plan for today's visit: (final revisions will be visible on MyChart chart later) Dizziness    QUESTIONS & CONCERNS: CLINICAL: please contact us via phone [phone] OR MyChart messaging  LAB & IMAGING:   We will call you if the results are significantly abnormal or you don't use MyChart.  Most normal results will be posted to MyChart immediately and have a clinical review message by Dr. Randol Kern posted within 2-3 business days.   If you have not heard from [place] regarding the results in 2 weeks OR if you need priority reporting, please contact this office. MYCHART:  The fastest way to get your results and easiest way to stay in touch with [place] is by activating your My Chart account. Instructions are located on the last page of this paperwork.  BILLING: xray and lab orders are billed from separate companies and questions./concerns should be directed to the [place].  For visit charges please discuss with our administrative services COMPLAINTS:  please let Dr. Randol Kern know or see the [place], by asking at the front desk: we want you to be satisfied with every experience and we would be grateful for the opportunity to address any problems

## 2022-08-30 NOTE — Progress Notes (Signed)
[place]  Phone: [phone]  New patient visit  Visit Date: 08/30/2022 Patient: Lydia Thompson   DOB: 1975-09-14   47 y.o. Female  MRN: [id number]  Today's healthcare provider: Loralee Pacas, MD  Assessment and Plan:   This is new patient establish visit in patient who has lost insurance and hasn't been able to do regular follow up as a result.  Also, without insurance, it was not affordable to do a comprehensive wellness exam- this would be 100% covered if she is able to get insurance and so she was encouraged to do so.  Pailynn was seen today for wellness check, dizziness, establish care and hot flashes.  Dizziness Overview: Patient reported dizziness and nausea with spins presumably vertigo all day Thursday jan 5.  Seemed to go away when she slept that night.  Patient denies taking new medication.   It worsened/aggravated with positional changes.  Assessment & Plan: Gave multiple handouts, suspect was transient benign paroxysmal positional vertigo (BPPV) Explained use of meclizine and Zofran and gave handouts and prescription for those, in case it returns.   Orders: -     Meclizine HCl; Take 1 tablet (25 mg total) by mouth 3 (three) times daily as needed for dizziness.  Dispense: 30 tablet; Refill: 1  Hypertrophic cardiomyopathy ([place]) Overview: With history of heart failure hospitalization   Assessment & Plan: Encouraged continued C    Pulmonary artery hypertension ([place]) Overview: Likely transient history of this because  Noted on 2019 CT : Prominence of the main pulmonary outflow tract, a finding felt to be indicative of pulmonary arterial hypertension.    Echocardiogram 05/2022 noted No  evidence of pulmonary hypertension.    Hot flashes, menopausal Overview: Tried OTC medications without help  Assessment & Plan: With history heart failure with pulmonary hypertension transient and tia  I'm worried for pulmonary embolism with her  hyperestrogenic appearance and elevated BMI- so did not offer estrogen. But she wanted something for this and vaginal dryness.  She doesn't take estrace vaginal cream due to price.   Orders: -     Estradiol; Limit use to minimum amount to control symptoms.  Dispense: 42.5 g; Refill: 1 -     DULoxetine HCl; Take 1 capsule (30 mg total) by mouth daily.  Dispense: 90 capsule; Refill: 3  Does not have health insurance  Low libido Assessment & Plan: We discussed testosterone replacement but I decided not to provide unless her cardiologist would approve due to her hypertrophic cardiomyopathy    Nausea -     Ondansetron; Take 1 tablet (4 mg total) by mouth every 8 (eight) hours as needed for nausea or vomiting (for nausea from wegovy or other source).  Dispense: 20 tablet; Refill: 0    Medical decision making: 2 or more stable chronic illnesses Prescription drug management  Diagnosis or treatment significantly limited by social determinants of health (lack of health insurance, dizziness and hot flashes)     Subjective:  Patient presents today to establish care.  Chief Complaint  Patient presents with   Wellness check    Patient is self pay.   Dizziness    Thursday for the first time for the whole day and felt nauseous.   Establish Care   Hot Flashes    Requests medication-going through menopause (since age of 66).    Problem-oriented charting was used to develop and update her medical history: Problem  Dizziness   Patient reported dizziness and nausea with spins presumably  vertigo all day Thursday jan 5.  Seemed to go away when she slept that night.  Patient denies taking new medication.   It worsened/aggravated with positional changes.   Pulmonary Artery Hypertension (Hcc)   Likely transient history of this because  Noted on 2019 CT : Prominence of the main pulmonary outflow tract, a finding felt to be indicative of pulmonary arterial hypertension.    Echocardiogram 05/2022  noted No  evidence of pulmonary hypertension.    Hot Flashes, Menopausal   Tried OTC medications without help   Low Libido  Nausea  Hypertrophic Cardiomyopathy (Hcc)   With history of heart failure hospitalization    Acute Bronchitis (Resolved)  Cough (Resolved)  Sob (Shortness of Breath) (Resolved)  Elevated Blood-Pressure Reading Without Diagnosis of Hypertension (Resolved)   Last Assessment & Plan:  Formatting of this note might be different from the original. Reports blood pressure elevation on occasion. Normal reading today. Continue to monitor closely for now. She will be returning in the near future for CPE and will recheck it at that time. She will also continue checking it periodically at home.      Depression Screen    08/30/2022    9:05 AM 08/06/2021    9:18 AM 08/11/2020    4:37 PM 07/09/2020    9:40 AM  PHQ 2/9 Scores  PHQ - 2 Score 0 0 0 0   No results found for any visits on 08/30/22.  The following were reviewed and entered/updated into her MEDICAL RECORD NUMBERPast Medical History:  Diagnosis Date   Anxiety    CHF (congestive heart failure) (HCC)    Hot flashes, menopausal 08/30/2022   Tried OTC medications without help   Hypertension    Low libido 08/30/2022   Past Surgical History:  Procedure Laterality Date   ABDOMINAL HYSTERECTOMY  08/23/2003   dermoid cyst   Family History  Problem Relation Age of Onset   Cancer Mother 53       bone cancer   Hypertension Mother    Other Sister        Pipolar   Heart disease Paternal Uncle    Arthritis Paternal Uncle    Asthma Paternal Uncle    Hypertension Paternal Uncle    Cancer Maternal Grandmother        Lung cancer   Outpatient Medications Prior to Visit  Medication Sig Dispense Refill   aspirin EC 81 MG tablet Take 1 tablet (81 mg total) by mouth daily. Swallow whole. 90 tablet 3   conjugated estrogens (PREMARIN) vaginal cream Place 0.5 gm vaginally daily for 3 weeks, 1 week off, then repeat 30 g 3    diltiazem (DILACOR XR) 180 MG 24 hr capsule Take 1 capsule (180 mg total) by mouth daily. 90 capsule 3   estradiol (CLIMARA) 0.025 mg/24hr patch Place 1 patch (0.025 mg total) onto the skin once a week. 4 patch 2   furosemide (LASIX) 20 MG tablet Take 1 tablet (20 mg total) by mouth 2 (two) times daily as needed. 180 tablet 3   metoprolol succinate (TOPROL-XL) 50 MG 24 hr tablet TAKE 1 TABLET (50 MG TOTAL) BY MOUTH DAILY. TAKE WITH OR IMMEDIATELY FOLLOWING A MEAL. 90 tablet 3   metoprolol tartrate (LOPRESSOR) 25 MG tablet Take 1 tablet by mouth 2 (two) times daily.     spironolactone (ALDACTONE) 25 MG tablet Take 1 tablet (25 mg total) by mouth daily. 90 tablet 3   albuterol (VENTOLIN HFA) 108 (90 Base) MCG/ACT inhaler  Inhale 2 puffs into the lungs every 4 (four) hours as needed. (Patient not taking: Reported on 08/30/2022) 6.7 g 0   azithromycin (ZITHROMAX Z-PAK) 250 MG tablet TAKE 2 TABLETS (500 MG) BY MOUTH ONCE DAILY FOR 1 DAY THEN 1 TABLET (250 MG) BY MOUTH ONCE DAILY FOR 4 DAYS (Patient not taking: Reported on 08/30/2022) 6 tablet 0   benzonatate (TESSALON) 100 MG capsule Take 1 capsule (100 mg total) by mouth 3 (three) times daily as needed for 5 days (Patient not taking: Reported on 08/30/2022) 15 capsule 0   Cholecalciferol (VITAMIN D3) 50 MCG (2000 UT) CHEW Chew 1 mcg by mouth daily. (Patient not taking: Reported on 08/30/2022)     estradiol (ESTRACE) 0.1 MG/GM vaginal cream  (Patient not taking: Reported on 08/30/2022)     No facility-administered medications prior to visit.    No Known Allergies Social History   Tobacco Use   Smoking status: Never   Smokeless tobacco: Never  Vaping Use   Vaping Use: Never used  Substance Use Topics   Alcohol use: Yes    Comment: 1 glass of wine every 3-4 months   Drug use: No    Immunization History  Administered Date(s) Administered   Tdap 10/18/2017    Objective:  BP 116/75 (BP Location: Left Arm, Patient Position: Sitting)   Pulse 60   Temp  97.7 F (36.5 C) (Temporal)   Ht 5\' 4"  (1.626 m)   Wt 232 lb (105.2 kg)   SpO2 98%   BMI 39.82 kg/m  Body mass index is 39.82 kg/m. indicates this is an Obese female , but waist circumference is a better indicator of healthy body composition. Physical Exam  Vital signs reviewed.  Nursing notes reviewed. General Appearance/Constitutional:  polite female in no acute distress Musculoskeletal: All extremities are intact.  Neurological:  Awake, alert,  No obvious focal neurological deficits or cognitive impairments Psychiatric:  Appropriate mood, pleasant demeanor Problem-specific findings:  skin smooth, hyperestrogenic adipose composition.    Results Reviewed: Results for orders placed or performed in visit on 05/14/21  POCT glycosylated hemoglobin (Hb A1C)  Result Value Ref Range   Hemoglobin A1C 5.2 4.0 - 5.6 %   HbA1c POC (<> result, manual entry)     HbA1c, POC (prediabetic range)     HbA1c, POC (controlled diabetic range)

## 2022-08-30 NOTE — Assessment & Plan Note (Signed)
With history heart failure with pulmonary hypertension transient and tia  I'm worried for pulmonary embolism with her hyperestrogenic appearance and elevated BMI- so did not offer estrogen. But she wanted something for this and vaginal dryness.  She doesn't take estrace vaginal cream due to price.

## 2022-08-30 NOTE — Assessment & Plan Note (Signed)
We discussed testosterone replacement but I decided not to provide unless her cardiologist would approve due to her hypertrophic cardiomyopathy

## 2022-08-31 ENCOUNTER — Other Ambulatory Visit: Payer: Self-pay

## 2022-09-01 ENCOUNTER — Encounter: Payer: Self-pay | Admitting: Cardiology

## 2022-09-01 ENCOUNTER — Other Ambulatory Visit: Payer: Self-pay

## 2022-09-01 ENCOUNTER — Ambulatory Visit: Payer: No Typology Code available for payment source | Admitting: Cardiology

## 2022-09-01 DIAGNOSIS — Z8673 Personal history of transient ischemic attack (TIA), and cerebral infarction without residual deficits: Secondary | ICD-10-CM

## 2022-09-01 DIAGNOSIS — I422 Other hypertrophic cardiomyopathy: Secondary | ICD-10-CM

## 2022-09-01 MED ORDER — METOPROLOL SUCCINATE ER 50 MG PO TB24
50.0000 mg | ORAL_TABLET | Freq: Every day | ORAL | 3 refills | Status: DC
  Filled 2022-09-01: qty 90, fill #0
  Filled 2022-10-06 (×2): qty 90, 90d supply, fill #0
  Filled 2022-12-29: qty 90, 90d supply, fill #1
  Filled 2023-03-25: qty 30, 30d supply, fill #2
  Filled 2023-04-28: qty 30, 30d supply, fill #3
  Filled 2023-05-25: qty 30, 30d supply, fill #4
  Filled 2023-06-23: qty 30, 30d supply, fill #5

## 2022-09-01 MED ORDER — ASPIRIN 81 MG PO TBEC: 81.0000 mg | DELAYED_RELEASE_TABLET | Freq: Every day | ORAL | 3 refills | Status: AC

## 2022-09-01 MED ORDER — DILTIAZEM HCL ER COATED BEADS 180 MG PO CP24
180.0000 mg | ORAL_CAPSULE | Freq: Every day | ORAL | 3 refills | Status: DC
  Filled 2022-09-01: qty 90, 90d supply, fill #0
  Filled 2022-09-22: qty 30, 30d supply, fill #0
  Filled 2022-11-01: qty 90, 90d supply, fill #1
  Filled 2023-01-25: qty 90, 90d supply, fill #2
  Filled 2023-02-09 – 2023-03-25 (×2): qty 90, 90d supply, fill #3
  Filled 2023-04-24: qty 30, 30d supply, fill #3
  Filled 2023-05-24: qty 30, 30d supply, fill #4
  Filled 2023-06-23: qty 30, 30d supply, fill #5
  Filled 2023-07-24: qty 30, 30d supply, fill #6

## 2022-09-01 MED ORDER — SPIRONOLACTONE 25 MG PO TABS
25.0000 mg | ORAL_TABLET | Freq: Every day | ORAL | 3 refills | Status: DC
  Filled 2022-09-01 – 2022-11-01 (×2): qty 90, 90d supply, fill #0
  Filled 2023-01-25: qty 90, 90d supply, fill #1
  Filled 2023-03-25: qty 90, 90d supply, fill #2
  Filled 2023-04-24: qty 30, 30d supply, fill #2
  Filled 2023-05-24: qty 30, 30d supply, fill #3
  Filled 2023-06-23: qty 30, 30d supply, fill #4
  Filled 2023-07-24: qty 30, 30d supply, fill #5

## 2022-09-01 NOTE — Progress Notes (Signed)
Patient is here for follow up visit.  Subjective:   Lydia Thompson, female    DOB: July 27, 1976, 47 y.o.   MRN: [id number]   Chief Complaint  Patient presents with   Hypertension   Shortness of Breath   Follow-up    3 month    47 year old African American female with hypertrophic cardiomyopathy, history of SVT, TIA, h/o COVID  Patient is doing well, denies chest pain, shortness of breath, palpitations, leg edema, orthopnea, PND, TIA/syncope.  Few days ago, he had vertigo symptoms that improved with Meclizine.     Current Outpatient Medications:    aspirin EC 81 MG tablet, Take 1 tablet (81 mg total) by mouth daily. Swallow whole., Disp: 90 tablet, Rfl: 3   benzonatate (TESSALON) 100 MG capsule, Take 1 capsule (100 mg total) by mouth 3 (three) times daily as needed for 5 days (Patient not taking: Reported on 08/30/2022), Disp: 15 capsule, Rfl: 0   conjugated estrogens (PREMARIN) vaginal cream, Place 0.5 gm vaginally daily for 3 weeks, 1 week off, then repeat, Disp: 30 g, Rfl: 3   diltiazem (DILACOR XR) 180 MG 24 hr capsule, Take 1 capsule (180 mg total) by mouth daily., Disp: 90 capsule, Rfl: 3   DULoxetine (CYMBALTA) 30 MG capsule, Take 1 capsule (30 mg total) by mouth daily., Disp: 90 capsule, Rfl: 3   estradiol (CLIMARA) 0.025 mg/24hr patch, Place 1 patch (0.025 mg total) onto the skin once a week., Disp: 4 patch, Rfl: 2   estradiol (ESTRACE) 0.1 MG/GM vaginal cream, Limit use to minimum amount to control symptoms., Disp: 42.5 g, Rfl: 1   furosemide (LASIX) 20 MG tablet, Take 1 tablet (20 mg total) by mouth 2 (two) times daily as needed., Disp: 180 tablet, Rfl: 3   meclizine (ANTIVERT) 25 MG tablet, Take 1 tablet (25 mg total) by mouth 3 (three) times daily as needed for dizziness., Disp: 30 tablet, Rfl: 1   metoprolol succinate (TOPROL-XL) 50 MG 24 hr tablet, TAKE 1 TABLET (50 MG TOTAL) BY MOUTH DAILY. TAKE WITH OR IMMEDIATELY FOLLOWING A MEAL., Disp: 90 tablet, Rfl: 3    metoprolol tartrate (LOPRESSOR) 25 MG tablet, Take 1 tablet by mouth 2 (two) times daily., Disp: , Rfl:    ondansetron (ZOFRAN-ODT) 4 MG disintegrating tablet, Take 1 tablet (4 mg total) by mouth every 8 (eight) hours as needed for nausea or vomiting (for nausea from wegovy or other source)., Disp: 20 tablet, Rfl: 0   spironolactone (ALDACTONE) 25 MG tablet, Take 1 tablet (25 mg total) by mouth daily., Disp: 90 tablet, Rfl: 3  Cardiovascular studies:  EKG 06/02/2022: Sinus rhythm 76 bpm Incomplete left bundle branch block Left atrial enlargement  Echocardiogram 05/25/2022:  Normal LV systolic function with EF 61%. Left ventricle cavity is normal  in size. Severe concentric remodeling of the left ventricle. Normal global  wall motion. Normal diastolic filling pattern. Calculated EF 61%.  Left atrial cavity is moderately dilated.  Moderate (Grade II) mitral regurgitation. Mild mitral valve leaflet  thickening.  Structurally normal tricuspid valve.  Mild tricuspid regurgitation. No  evidence of pulmonary hypertension.  Compared to the study done on 05/11/2021, no significant change.    Previously noted LVOT gradient of 13 mmHg is not present.   Coronary CT angiogram 10/17/2018: 1. Coronary calcium score of 0. This was 0 percentile for age and sex matched control. 2. Normal coronary origin with right dominance. 3. No evidence of CAD. 4. Moderately dilated pulmonary artery measuring 36  mm suggestive of pulmonary hypertension.    Exercise Treadmill Stress Test 01/12/2018:  Indication: SoB  The patient exercised on Bruce protocol for 4:00 min. Patient achieved  5.84 METS and reached HR  154 bpm, which is   85% of maximum age-predicted HR.  Stress test terminated due to 7/10 CP and Fatigue.  Exercise capacity was below average for age.  HR Response to Exercise: Exaggerated HR response. BP Response to Exercise: Normal resting BP- appropriate response. Chest Pain: limiting. Arrhythmias:  none. ST Changes:  Rest: Sinus rhythm 90 bpm. 1-2 mm T wave inversions leads II, III, aVF Stress: Sinus tachycardia. >2 mm T wave inversions leads II, III, aVF, V5,V 6. <1 mm ST elevation lead aVR. ST changes persist 2 min into recovery.   Overall Impression: Positive stress test typical of diffuse subendocardial ischemia. Consider further cardiac workup.  Recent labs: 07/09/2020: Glucose 79, BUN/Cr 12/0.82. EGFR 101. Na/K 136/4.4. Rest of the CMP normal Chol 172, TG 59, HDL 37, LDL 123  03/2020: H/H 13/41. MCV 81. Platelets 219  11/2019: TSH 1.4  Review of Systems  Cardiovascular:  Negative for chest pain, dyspnea on exertion, leg swelling, palpitations and syncope.       Objective:    Vitals:   09/01/22 0836  BP: 124/84  Pulse: 78  Resp: 16  SpO2: 97%   Filed Weights   09/01/22 0836  Weight: 234 lb (106.1 kg)      Physical Exam Vitals and nursing note reviewed.  Constitutional:      General: She is not in acute distress. Neck:     Vascular: No JVD.  Cardiovascular:     Rate and Rhythm: Normal rate and regular rhythm.     Pulses: Intact distal pulses.     Heart sounds: Murmur (I/VI ESM RUSB) heard.  Pulmonary:     Effort: Pulmonary effort is normal.     Breath sounds: Normal breath sounds. No wheezing or rales.  Abdominal:     Tenderness: There is no rebound.  Musculoskeletal:     Right lower leg: No edema.     Left lower leg: No edema.         Assessment & Recommendations:   46 year old [place] American female with hypertrophic cardiomyopathy, history of SVT, TIA, h/o COVID  Hypertrophic cardiomyopathy ([place]) Well controlled.  Continue metoprolol and diltiazem.  Chronic heart failure with preserved ejection fraction (HCC) Due to #2. Currently stable.  Tolerating low dose spironolactone. Recommend daily weights. Discussed importance of low salt diet.   H/o TIA: No recurrence. Continue Aspirin 81 mg daily.  F/u in 1 year   Nigel Mormon, MD Pager: [phone] Office: [phone]

## 2022-09-07 ENCOUNTER — Other Ambulatory Visit: Payer: Self-pay

## 2022-09-16 ENCOUNTER — Telehealth: Payer: Self-pay

## 2022-09-16 NOTE — Telephone Encounter (Signed)
Patient calling because she can feel fluid on her lungs when she coughs. She normally has to go to ED to have it drained, but is wanting to know if there are any other options as she doesn't want to go wait at the ED. After consulting with Dr. Virgina Jock, he instructed the patient to take her lasix and if it doesn't improve he will see her in the upcoming week. If symptoms worsen over the weekend, to go to the ED.

## 2022-09-19 ENCOUNTER — Encounter: Payer: Self-pay | Admitting: Cardiology

## 2022-09-19 ENCOUNTER — Ambulatory Visit: Payer: Self-pay | Admitting: Cardiology

## 2022-09-19 VITALS — BP 129/79 | HR 79 | Resp 16 | Ht 64.0 in | Wt 229.2 lb

## 2022-09-19 DIAGNOSIS — Z8673 Personal history of transient ischemic attack (TIA), and cerebral infarction without residual deficits: Secondary | ICD-10-CM

## 2022-09-19 DIAGNOSIS — I422 Other hypertrophic cardiomyopathy: Secondary | ICD-10-CM

## 2022-09-19 NOTE — Progress Notes (Signed)
Patient is here for follow up visit.  Subjective:   Lydia Thompson, female    DOB: 1976/05/26, 47 y.o.   MRN: [id number]   Chief Complaint  Patient presents with   Cardiomyopathy   Hypertension   Follow-up    47 year old African American female with hypertrophic cardiomyopathy, history of SVT, TIA, h/o COVID  I just saw the patient two weeks ago. Recently, she had sick contact through her son who had the flu. Patient has noticed wheezing and runny nose, but no other symptoms. She took two extra pills of lasix. She has no complaints today.   Current Outpatient Medications:    aspirin EC 81 MG tablet, Take 1 tablet (81 mg total) by mouth daily. Swallow whole., Disp: 90 tablet, Rfl: 3   conjugated estrogens (PREMARIN) vaginal cream, Place 0.5 gm vaginally daily for 3 weeks, 1 week off, then repeat, Disp: 30 g, Rfl: 3   diltiazem (CARDIZEM CD) 180 MG 24 hr capsule, Take 1 capsule (180 mg total) by mouth daily., Disp: 90 capsule, Rfl: 3   DULoxetine (CYMBALTA) 30 MG capsule, Take 1 capsule (30 mg total) by mouth daily., Disp: 90 capsule, Rfl: 3   estradiol (ESTRACE) 0.1 MG/GM vaginal cream, Limit use to minimum amount to control symptoms., Disp: 42.5 g, Rfl: 1   furosemide (LASIX) 20 MG tablet, Take 1 tablet (20 mg total) by mouth 2 (two) times daily as needed., Disp: 180 tablet, Rfl: 3   meclizine (ANTIVERT) 25 MG tablet, Take 1 tablet (25 mg total) by mouth 3 (three) times daily as needed for dizziness., Disp: 30 tablet, Rfl: 1   metoprolol succinate (TOPROL-XL) 50 MG 24 hr tablet, TAKE 1 TABLET (50 MG TOTAL) BY MOUTH DAILY. TAKE WITH OR IMMEDIATELY FOLLOWING A MEAL., Disp: 90 tablet, Rfl: 3   ondansetron (ZOFRAN-ODT) 4 MG disintegrating tablet, Take 1 tablet (4 mg total) by mouth every 8 (eight) hours as needed for nausea or vomiting (for nausea from wegovy or other source)., Disp: 20 tablet, Rfl: 0   spironolactone (ALDACTONE) 25 MG tablet, Take 1 tablet (25 mg total) by mouth  daily., Disp: 90 tablet, Rfl: 3  Cardiovascular studies:  EKG 06/02/2022: Sinus rhythm 76 bpm Incomplete left bundle branch block Left atrial enlargement  Echocardiogram 05/25/2022:  Normal LV systolic function with EF 61%. Left ventricle cavity is normal  in size. Severe concentric remodeling of the left ventricle. Normal global  wall motion. Normal diastolic filling pattern. Calculated EF 61%.  Left atrial cavity is moderately dilated.  Moderate (Grade II) mitral regurgitation. Mild mitral valve leaflet  thickening.  Structurally normal tricuspid valve.  Mild tricuspid regurgitation. No  evidence of pulmonary hypertension.  Compared to the study done on 05/11/2021, no significant change.    Previously noted LVOT gradient of 13 mmHg is not present.   Coronary CT angiogram 10/17/2018: 1. Coronary calcium score of 0. This was 0 percentile for age and sex matched control. 2. Normal coronary origin with right dominance. 3. No evidence of CAD. 4. Moderately dilated pulmonary artery measuring 36 mm suggestive of pulmonary hypertension.    Exercise Treadmill Stress Test 01/12/2018:  Indication: SoB  The patient exercised on Bruce protocol for 4:00 min. Patient achieved  5.84 METS and reached HR  154 bpm, which is   85% of maximum age-predicted HR.  Stress test terminated due to 7/10 CP and Fatigue.  Exercise capacity was below average for age.  HR Response to Exercise: Exaggerated HR response. BP Response  to Exercise: Normal resting BP- appropriate response. Chest Pain: limiting. Arrhythmias: none. ST Changes:  Rest: Sinus rhythm 90 bpm. 1-2 mm T wave inversions leads II, III, aVF Stress: Sinus tachycardia. >2 mm T wave inversions leads II, III, aVF, V5,V 6. <1 mm ST elevation lead aVR. ST changes persist 2 min into recovery.   Overall Impression: Positive stress test typical of diffuse subendocardial ischemia. Consider further cardiac workup.  Recent  labs: 07/09/2020: Glucose 79, BUN/Cr 12/0.82. EGFR 101. Na/K 136/4.4. Rest of the CMP normal Chol 172, TG 59, HDL 37, LDL 123  03/2020: H/H 13/41. MCV 81. Platelets 219  11/2019: TSH 1.4  Review of Systems  Cardiovascular:  Negative for chest pain, dyspnea on exertion, leg swelling, palpitations and syncope.       Objective:    Vitals:   09/19/22 1420  BP: 129/79  Pulse: 79  Resp: 16  SpO2: 99%   Filed Weights   09/19/22 1420  Weight: 229 lb 3.2 oz (104 kg)      Physical Exam Vitals and nursing note reviewed.  Constitutional:      General: She is not in acute distress. Neck:     Vascular: No JVD.  Cardiovascular:     Rate and Rhythm: Normal rate and regular rhythm.     Pulses: Intact distal pulses.     Heart sounds: Murmur (I/VI ESM RUSB) heard.  Pulmonary:     Effort: Pulmonary effort is normal.     Breath sounds: Normal breath sounds. No wheezing or rales.  Abdominal:     Tenderness: There is no rebound.  Musculoskeletal:     Right lower leg: No edema.     Left lower leg: No edema.         Assessment & Recommendations:   47 year old [place] American female with hypertrophic cardiomyopathy, history of SVT, TIA, h/o COVID  Hypertrophic cardiomyopathy ([place]) Well controlled.  Continue metoprolol and diltiazem.  Chronic heart failure with preserved ejection fraction (HCC) Due to #2. Currently stable.  Tolerating low dose spironolactone. Recommend daily weights. Discussed importance of low salt diet.   No volume overload on exam. I cannot rule put the possibility that she in fact may have had mild version of flu herself. She is not in heart failure decompensation at this time.   H/o TIA: No recurrence. Continue Aspirin 81 mg daily.  F/u in 1 year   Nigel Mormon, MD Pager: [phone] Office: [phone]

## 2022-09-22 ENCOUNTER — Other Ambulatory Visit: Payer: Self-pay

## 2022-09-26 ENCOUNTER — Other Ambulatory Visit: Payer: Self-pay

## 2022-09-29 ENCOUNTER — Other Ambulatory Visit: Payer: Self-pay

## 2022-09-30 ENCOUNTER — Ambulatory Visit: Payer: Self-pay | Admitting: Internal Medicine

## 2022-10-06 ENCOUNTER — Encounter: Payer: Self-pay | Admitting: Internal Medicine

## 2022-10-06 ENCOUNTER — Other Ambulatory Visit: Payer: Self-pay

## 2022-10-06 ENCOUNTER — Ambulatory Visit (INDEPENDENT_AMBULATORY_CARE_PROVIDER_SITE_OTHER): Payer: No Typology Code available for payment source | Admitting: Internal Medicine

## 2022-10-06 VITALS — BP 95/62 | HR 60 | Temp 97.6°F | Ht 64.0 in | Wt 228.4 lb

## 2022-10-06 DIAGNOSIS — Z1211 Encounter for screening for malignant neoplasm of colon: Secondary | ICD-10-CM

## 2022-10-06 DIAGNOSIS — I5032 Chronic diastolic (congestive) heart failure: Secondary | ICD-10-CM

## 2022-10-06 DIAGNOSIS — R42 Dizziness and giddiness: Secondary | ICD-10-CM

## 2022-10-06 DIAGNOSIS — N951 Menopausal and female climacteric states: Secondary | ICD-10-CM

## 2022-10-06 DIAGNOSIS — F419 Anxiety disorder, unspecified: Secondary | ICD-10-CM

## 2022-10-06 DIAGNOSIS — F418 Other specified anxiety disorders: Secondary | ICD-10-CM | POA: Insufficient documentation

## 2022-10-06 DIAGNOSIS — I2721 Secondary pulmonary arterial hypertension: Secondary | ICD-10-CM

## 2022-10-06 DIAGNOSIS — N952 Postmenopausal atrophic vaginitis: Secondary | ICD-10-CM

## 2022-10-06 DIAGNOSIS — Z5989 Other problems related to housing and economic circumstances: Secondary | ICD-10-CM

## 2022-10-06 MED ORDER — ZEPBOUND 2.5 MG/0.5ML ~~LOC~~ SOAJ
2.5000 mg | SUBCUTANEOUS | 3 refills | Status: DC
  Filled 2022-10-06 (×2): qty 0.5, 7d supply, fill #0

## 2022-10-06 MED ORDER — VEOZAH 45 MG PO TABS
1.0000 | ORAL_TABLET | Freq: Every day | ORAL | 11 refills | Status: DC
  Filled 2022-10-06 (×2): qty 30, 30d supply, fill #0
  Filled 2022-11-01: qty 30, 30d supply, fill #1
  Filled 2022-12-21 – 2022-12-28 (×2): qty 30, 30d supply, fill #2
  Filled 2023-01-25: qty 30, 30d supply, fill #3
  Filled 2023-02-23: qty 90, 90d supply, fill #4

## 2022-10-06 MED ORDER — ZEPBOUND 2.5 MG/0.5ML ~~LOC~~ SOAJ
2.5000 mg | SUBCUTANEOUS | 3 refills | Status: DC
  Filled 2022-10-06 – 2022-12-21 (×2): qty 2, 28d supply, fill #0

## 2022-10-06 MED ORDER — DULOXETINE HCL 60 MG PO CPEP
60.0000 mg | ORAL_CAPSULE | Freq: Every day | ORAL | 3 refills | Status: DC
  Filled 2022-10-06: qty 90, 90d supply, fill #0
  Filled 2022-12-29: qty 90, 90d supply, fill #1
  Filled 2023-02-10 – 2023-03-29 (×2): qty 30, 30d supply, fill #2
  Filled 2023-04-28: qty 30, 30d supply, fill #3
  Filled 2023-05-25: qty 30, 30d supply, fill #4
  Filled 2023-06-23: qty 30, 30d supply, fill #5
  Filled 2023-07-24: qty 30, 30d supply, fill #6

## 2022-10-06 NOTE — Assessment & Plan Note (Signed)
    10/06/2022   10:04 AM 07/09/2020    9:40 AM 02/19/2020   11:14 AM  GAD 7 : Generalized Anxiety Score  Nervous, Anxious, on Edge 2 1 0  Control/stop worrying 2 0 0  Worry too much - different things 2 0 0  Trouble relaxing 0 0 0  Restless 3 0 0  Easily annoyed or irritable 3 1 0  Afraid - awful might happen 0 0 0  Total GAD 7 Score 12 2 0  Anxiety Difficulty Somewhat difficult Somewhat difficult     Encouraged continuing with duloxetine but increase dose.

## 2022-10-06 NOTE — Assessment & Plan Note (Signed)
Currently improved after 2-3 weeks of lasix daily 40 mg Also limiting salt. Avoids alcohol

## 2022-10-06 NOTE — Assessment & Plan Note (Signed)
I encouraged her to to ask her cardiologist about whether she would be a good candidate for bariatric surgery I encouraged her to try getting this up bound with the savings card which is a super great weight loss medication

## 2022-10-06 NOTE — Progress Notes (Signed)
[facility]: [phone]   Routine Medical Office Visit  Patient:  Lydia Thompson      Age: 47 y.o.       Sex:  female  Date:   10/06/2022  PCP:    Loralee Pacas, [place] Provider: Loralee Pacas, MD   Problem Focused Charting:   Medical Decision Making per Assessment/Plan   Amilah was seen today for 1 month follow-up.  Colon cancer screening -     Cologuard  Chronic heart failure with preserved ejection fraction (HCC) Overview: Associated with pulmonary hypertension and hypertrophic cardiomyopathy   Assessment & Plan: Currently improved after 2-3 weeks of lasix daily 40 mg Also limiting salt. Avoids alcohol   Dizziness Overview: Patient reported dizziness and nausea with spins presumably vertigo all day  jan 5.  Seemed to go away when she slept that night.  Patient denies taking new medication.   It worsened/aggravated with positional changes. Associated with history pulmonary hypertension and CHF and hypertrophic cardiomyopathy  Has not returned   Anxiety disorder, unspecified type Overview: Cymbalta is helping anxiety and was chose to possibly help vasomotor hot flashes as well.  Assessment & Plan:    10/06/2022   10:04 AM 07/09/2020    9:40 AM 02/19/2020   11:14 AM  GAD 7 : Generalized Anxiety Score  Nervous, Anxious, on Edge 2 1 0  Control/stop worrying 2 0 0  Worry too much - different things 2 0 0  Trouble relaxing 0 0 0  Restless 3 0 0  Easily annoyed or irritable 3 1 0  Afraid - awful might happen 0 0 0  Total GAD 7 Score 12 2 0  Anxiety Difficulty Somewhat difficult Somewhat difficult     Encouraged continuing with duloxetine but increase dose.  Orders: -     DULoxetine HCl; Take 1 capsule (60 mg total) by mouth daily. Replaces 30 mg dose  Dispense: 90 capsule; Refill: 3  Hot flashes, menopausal Overview: Tried OTC medications without help Premarin went up to 430$  Orders: -     Veozah; Take 1  tablet (45 mg total) by mouth daily at 6 (six) AM.  Dispense: 30 tablet; Refill: 11  Insurance coverage problems  Pulmonary artery hypertension (HCC) Overview: Likely transient history of this because  Noted on 2019 CT : Prominence of the main pulmonary outflow tract, a finding felt to be indicative of pulmonary arterial hypertension.    Echocardiogram 05/2022 noted No  evidence of pulmonary hypertension.   Assessment & Plan: Investigated whether she hadd ever seen pulmonary and she reported she had not Arranged referral to further evaluate this - maybe secondary to hypertrophic cardiomyopathy but would like expert evaluate and confirmation   Orders: -     Ambulatory referral to Pulmonology  Obesity, Class III, BMI 40-49.9 (morbid obesity) ([place]) Overview: Not currently on medication 10/06/22  Assessment & Plan: I encouraged her to to ask her cardiologist about whether she would be a good candidate for bariatric surgery I encouraged her to try getting this up bound with the savings card which is a super great weight loss medication  Orders: -     Zepbound; Inject 2.5 mg into the skin once a week.  Dispense: 2 mL; Refill: 3  Post-menopause atrophic vaginitis Overview: Premarin went to 430$ History transient ishemic attack (TIA)  so estrogen tablet contraindicated         Subjective - Clinical Presentation:   Lydia Thompson  is a 47 y.o. female  Patient Active Problem List   Diagnosis Date Noted   Anxiety disorder 10/06/2022   Dizziness 08/30/2022   Pulmonary artery hypertension (HCC) 08/30/2022   Hot flashes, menopausal 08/30/2022   Low libido 08/30/2022   Nausea 0000000   Diastolic dysfunction 0000000   Post-menopause atrophic vaginitis 06/22/2022   Obesity, Class III, BMI 40-49.9 (morbid obesity) ([place]) 10/08/2020   Chronic heart failure with preserved ejection fraction ([place]) 11/08/2019   H/O TIA (transient ischemic attack) and stroke 05/01/2019    Hypertrophic cardiomyopathy ([place]) 04/28/2019   Essential hypertension 09/26/2017   Past Medical History:  Diagnosis Date   Anxiety    Chest pain of uncertain etiology [id number]   CHF (congestive heart failure) (HCC)    Hot flashes, menopausal 08/30/2022   Tried OTC medications without help   Hypertension    Low libido 08/30/2022    Outpatient Medications Prior to Visit  Medication Sig   aspirin EC 81 MG tablet Take 1 tablet (81 mg total) by mouth daily. Swallow whole.   conjugated estrogens (PREMARIN) vaginal cream Place 0.5 gm vaginally daily for 3 weeks, 1 week off, then repeat   diltiazem (CARDIZEM CD) 180 MG 24 hr capsule Take 1 capsule (180 mg total) by mouth daily.   estradiol (ESTRACE) 0.1 MG/GM vaginal cream Limit use to minimum amount to control symptoms.   furosemide (LASIX) 20 MG tablet Take 1 tablet (20 mg total) by mouth 2 (two) times daily as needed.   meclizine (ANTIVERT) 25 MG tablet Take 1 tablet (25 mg total) by mouth 3 (three) times daily as needed for dizziness.   metoprolol succinate (TOPROL-XL) 50 MG 24 hr tablet TAKE 1 TABLET (50 MG TOTAL) BY MOUTH DAILY. TAKE WITH OR IMMEDIATELY FOLLOWING A MEAL.   ondansetron (ZOFRAN-ODT) 4 MG disintegrating tablet Take 1 tablet (4 mg total) by mouth every 8 (eight) hours as needed for nausea or vomiting (for nausea from wegovy or other source).   spironolactone (ALDACTONE) 25 MG tablet Take 1 tablet (25 mg total) by mouth daily.   [DISCONTINUED] DULoxetine (CYMBALTA) 30 MG capsule Take 1 capsule (30 mg total) by mouth daily.   No facility-administered medications prior to visit.    Chief Complaint  Patient presents with   1 month follow-up    HPI  1 month follow up for dizziness spell that seems to have self resolvedc and not returned Went over other medical issues instead and updated those since nothing acute or dizziness follow up needed- although I did suggest dizziness may have been related to her hypertrophic  cardiomyopathy  or pulmonary hypertension   Problem  Anxiety Disorder   Cymbalta is helping anxiety and was chose to possibly help vasomotor hot flashes as well.   Dizziness   Patient reported dizziness and nausea with spins presumably vertigo all day  jan 5.  Seemed to go away when she slept that night.  Patient denies taking new medication.   It worsened/aggravated with positional changes. Associated with history pulmonary hypertension and CHF and hypertrophic cardiomyopathy  Has not returned   Pulmonary Artery Hypertension (Hcc)   Likely transient history of this because  Noted on 2019 CT : Prominence of the main pulmonary outflow tract, a finding felt to be indicative of pulmonary arterial hypertension.    Echocardiogram 05/2022 noted No  evidence of pulmonary hypertension.    Hot Flashes, Menopausal   Tried OTC medications without help Premarin went up to 430$   Post-Menopause Atrophic Vaginitis  Premarin went to 430$ History transient ishemic attack (TIA)  so estrogen tablet contraindicated    Obesity, Class III, Bmi 40-49.9 (Morbid Obesity) (Hcc)   Not currently on medication 10/06/22   Chronic Heart Failure With Preserved Ejection Fraction (Hcc)   Associated with pulmonary hypertension and hypertrophic cardiomyopathy    Chest Pain of Uncertain Etiology (Resolved)            Objective:  Physical Exam  BP 95/62 (BP Location: Left Arm, Patient Position: Sitting)   Pulse 60   Temp 97.6 F (36.4 C) (Temporal)   Ht 5' 4"$  (1.626 m)   Wt 228 lb 6.4 oz (103.6 kg)   SpO2 94%   BMI 39.20 kg/m  Obese  by BMI criteria but truncal adiposity (waist circumference or caliper) should be used instead. Wt Readings from Last 10 Encounters:  10/06/22 228 lb 6.4 oz (103.6 kg)  09/19/22 229 lb 3.2 oz (104 kg)  09/01/22 234 lb (106.1 kg)  08/30/22 232 lb (105.2 kg)  06/02/22 236 lb (107 kg)  08/06/21 231 lb 9.6 oz (105.1 kg)  05/28/21 236 lb (107 kg)  05/14/21 232 lb 0.4  oz (105.2 kg)  04/27/21 235 lb (106.6 kg)  04/09/21 234 lb (106.1 kg)   Vital signs reviewed.  Nursing notes reviewed. Weight trend reviewed. General Appearance:  Well developed, well nourished female in no acute distress.   Normal work of breathing at rest Musculoskeletal: All extremities are intact.  Neurological:  Awake, alert,  No obvious focal neurological deficits or cognitive impairments Psychiatric:  Appropriate mood, pleasant demeanor Problem-specific findings:  very nice lady   Results Reviewed: No results found for any visits on 10/06/22.  No results found for this or any previous visit (from the past 2160 hour(s)).        Signed: Loralee Pacas, MD 10/06/2022 7:57 PM

## 2022-10-06 NOTE — Assessment & Plan Note (Signed)
Investigated whether she hadd ever seen pulmonary and she reported she had not Arranged referral to further evaluate this - maybe secondary to hypertrophic cardiomyopathy but would like expert evaluate and confirmation

## 2022-10-07 ENCOUNTER — Telehealth: Payer: Self-pay

## 2022-10-07 ENCOUNTER — Other Ambulatory Visit: Payer: Self-pay

## 2022-10-07 NOTE — Telephone Encounter (Signed)
LM with Dr.Patwardhan response to her question.

## 2022-10-07 NOTE — Telephone Encounter (Signed)
Patient wants to know if you will give clearance for her to have bariatric surgery. Her PCP Berniece Pap has suggested it for her .

## 2022-10-07 NOTE — Telephone Encounter (Signed)
Yes. Please let me know the surgeon name etc  Thanks MJP

## 2022-11-01 ENCOUNTER — Other Ambulatory Visit: Payer: Self-pay

## 2022-11-02 ENCOUNTER — Other Ambulatory Visit: Payer: Self-pay

## 2022-11-29 ENCOUNTER — Ambulatory Visit: Payer: No Typology Code available for payment source

## 2022-12-06 ENCOUNTER — Ambulatory Visit (INDEPENDENT_AMBULATORY_CARE_PROVIDER_SITE_OTHER): Payer: No Typology Code available for payment source | Admitting: Internal Medicine

## 2022-12-06 ENCOUNTER — Encounter: Payer: Self-pay | Admitting: Internal Medicine

## 2022-12-06 VITALS — BP 100/78 | HR 63 | Temp 97.9°F | Ht 64.0 in | Wt 226.0 lb

## 2022-12-06 DIAGNOSIS — Z0000 Encounter for general adult medical examination without abnormal findings: Secondary | ICD-10-CM

## 2022-12-06 DIAGNOSIS — I5032 Chronic diastolic (congestive) heart failure: Secondary | ICD-10-CM

## 2022-12-06 DIAGNOSIS — Z119 Encounter for screening for infectious and parasitic diseases, unspecified: Secondary | ICD-10-CM

## 2022-12-06 DIAGNOSIS — R6882 Decreased libido: Secondary | ICD-10-CM | POA: Diagnosis not present

## 2022-12-06 LAB — MAGNESIUM: Magnesium: 2.2 mg/dL (ref 1.5–2.5)

## 2022-12-06 LAB — LIPID PANEL
Cholesterol: 182 mg/dL (ref 0–200)
HDL: 34.8 mg/dL — ABNORMAL LOW (ref >39.00)
LDL Cholesterol: 128 mg/dL — ABNORMAL HIGH (ref 0–99)
NonHDL: 146.81
Total CHOL/HDL Ratio: 5
Triglycerides: 95 mg/dL (ref 0.0–149.0)
VLDL: 19 mg/dL (ref 0.0–40.0)

## 2022-12-06 LAB — CBC WITH DIFFERENTIAL/PLATELET
Basophils Absolute: 0.1 10*3/uL (ref 0.0–0.1)
Basophils Relative: 0.6 % (ref 0.0–3.0)
Eosinophils Absolute: 0.3 10*3/uL (ref 0.0–0.7)
Eosinophils Relative: 2.8 % (ref 0.0–5.0)
HCT: 41.9 % (ref 36.0–46.0)
Hemoglobin: 14.1 g/dL (ref 12.0–15.0)
Lymphocytes Relative: 24 % (ref 12.0–46.0)
Lymphs Abs: 2.5 10*3/uL (ref 0.7–4.0)
MCHC: 33.6 g/dL (ref 30.0–36.0)
MCV: 82.5 fl (ref 78.0–100.0)
Monocytes Absolute: 0.7 10*3/uL (ref 0.1–1.0)
Monocytes Relative: 6.6 % (ref 3.0–12.0)
Neutro Abs: 6.9 10*3/uL (ref 1.4–7.7)
Neutrophils Relative %: 66 % (ref 43.0–77.0)
Platelets: 315 10*3/uL (ref 150.0–400.0)
RBC: 5.08 Mil/uL (ref 3.87–5.11)
RDW: 14.6 % (ref 11.5–15.5)
WBC: 10.5 10*3/uL (ref 4.0–10.5)

## 2022-12-06 LAB — COMPREHENSIVE METABOLIC PANEL
ALT: 22 U/L (ref 0–35)
AST: 16 U/L (ref 0–37)
Albumin: 4.4 g/dL (ref 3.5–5.2)
Alkaline Phosphatase: 70 U/L (ref 39–117)
BUN: 17 mg/dL (ref 6–23)
CO2: 29 mEq/L (ref 19–32)
Calcium: 9.4 mg/dL (ref 8.4–10.5)
Chloride: 98 mEq/L (ref 96–112)
Creatinine, Ser: 0.87 mg/dL (ref 0.40–1.20)
GFR: 80.42 mL/min (ref >60.00)
Glucose, Bld: 101 mg/dL — ABNORMAL HIGH (ref 70–99)
Potassium: 4 mEq/L (ref 3.5–5.1)
Sodium: 135 mEq/L (ref 135–145)
Total Bilirubin: 0.7 mg/dL (ref 0.2–1.2)
Total Protein: 7.8 g/dL (ref 6.0–8.3)

## 2022-12-06 LAB — HEMOGLOBIN A1C: Hgb A1c MFr Bld: 5.5 % (ref 4.6–6.5)

## 2022-12-06 LAB — TSH: TSH: 2.38 u[IU]/mL (ref 0.35–5.50)

## 2022-12-06 NOTE — Patient Instructions (Signed)
[facility] Department of Health and [employer] ([place]): The [employer] website might have information on mammogram screening programs throughout the state. Search for "free mammograms" or "breast cancer screening" on their website. [employer] ([employer]): The NCI has a Furniture conservator/restorer tool that can help you find low-cost cancer screening programs in your area. You can access it here: cancer screening programs locator: [invalid URL removed]

## 2022-12-06 NOTE — Progress Notes (Signed)
Phone [phone]  Routine Medical Office Visit  Date:    12/06/2022   Patient Name:  CAMESHA FAROOQ  Healthcare Provider:  Lula Olszewski, MD   Chief Complaint:   Chief Complaint  Patient presents with   Annual Exam     Assessment/Plan:   Symphany was seen today for annual exam.  Encounter for preventive care  Chronic heart failure with preserved ejection fraction Overview: Associated with pulmonary hypertension and hypertrophic cardiomyopathy   Orders: -     Urine cytology ancillary only -     Ambulatory referral to Integrative Medicine -     Lipid panel -     TSH -     Comprehensive metabolic panel -     CBC with Differential/Platelet -     Hemoglobin A1c -     Magnesium  Obesity, Class III, BMI 40-49.9 (morbid obesity) Overview: Not currently on medication 10/06/22  Orders: -     TSH -     Hemoglobin A1c   Today's preventive care visit included comprehensive health maintenance evaluation and gap closure alongside extensive anticipatory guidance, as follows: #  Eye exams: recommended every 1-2 years.   She voiced understanding and intent to adhere to that plan. Already does #  Dental health: recommended regular tooth brushing, flossing, and dental visits every 6 months.  She voiced understanding and doesn't have dental insurance yet so I showed her the new descaler. #  Sinus health: nightly SimplySaline or similar recommended now.  She voiced understanding and intent to adhere to that plan. #  Diet/Exercise:   recommended regular exercise (150 min) and diet rich and fruits and vegetables and fiber and healthy fats to reduce risk of heart attack and stroke. She voiced understanding and intent to adhere to this plan. We did motivational interviewing and she reported 8/10 interest in adopting positive lifestyle goals, in part due to her friend. #  Sexuality: recommended STD prevention via partner selection & condoms.  STD checks / genital wart treatment if appropriate.  I ordered sexual transmitted infection screening incase insurance covers as preventive care... She will check with her insurance about that. #  Cervical cancer screening: Recommended pap smear with hpv every 5 yrs from 25-65. Dr. Skeet Latch did and negative 2022 #  Breast cancer screening:   encouraged compliance with recommended screening guidelines.  last breast exam 2022 and last mammogram never- she wasn't able to update it due to price. I looked up the following info for her; [facility] Department of Health and Health and safety inspector ([place]): The [facility] website might have information on mammogram screening programs throughout the state. Search for "free mammograms" or "breast cancer screening" on their website. [employer] ([employer]): The NCI has a Furniture conservator/restorer tool that can help you find low-cost cancer screening programs in your area. You can access it here: cancer screening programs locator: [invalid URL removed] #  Uterine/ovarian cancer screening:  discussed current lack of screening guidelines or insurance support for testing.  Explained signs of endometrial cancer are irregular bleeding, postmenopausal bleeding, or pelvic pain.  Signs of ovarian cancer might be a frequent swollen tummy or feeling bloated. pain or tenderness in your tummy or the area between the hips (pelvis), no appetite or feeling full quickly after eating, or an urgent need to pee or needing to pee more often. #  Thyroid cancer screening:  discussed need to palpate thyroid about every 6 months for nodules, we palpated her thyroid today and no  nodules felt. #  Colon cancer screening:  denies family history of colon cancer or any GI bleeding,  Cologuard is at her house I explained that it will be free just sent it in. #  Skin cancer screening: recommended regular sunscreen use. she denies worrisome, changing, or new skin lesions. Showed images of melanoma to help clarify what to look for. #  Osteoporosis prevention:   recommended to maintain a good source of calcium and vitamin D in diet.   #  Substance use: recommended absolute abstinence from all vaped or smoked recreational or illicit substances of abuse such as tobacco, nicotine, alcohol, illicit drugs, even sugar.  She does use ice cream. #  Safety:  recommended avoiding high risk activities, wear seat belts, use smoke detectors #  Health maintenance and immunizations reviewed and she was encouraged to complete any incomplete and release any missing immunization records for [place] Immunization History  Administered Date(s) Administered   Tdap 10/18/2017   #  We attempted to update vaccination records and provide any missing vaccines that are recommended. Health Maintenance Due  Topic Date Due   COLONOSCOPY (Pts 45-24yrs Insurance coverage will need to be confirmed)  Never done   Colonoscopy  not needed if Cologuard completed # Incomplete health maintenance issues listed above were brought up for discussion and she was encouraged to complete with our assistance. # Recommended follow up:  continued annual preventive health maintenance exams    Subjective:   MAECI KALBFLEISCH is a 47 y.o. female who presents today for her annual comprehensive physical exam.   She has the following specific preventive medicine concerns for today: none She has the following specific medical problems she would like to discuss:  low libido since menopause. Concerned about her husband reaction to this.  ROS totally negative per patient. A comprehensive ROS was completed by worksheet today and was negative for all of the following Constitutional:   chills, diaphoresis, fever, malaise/fatigue and weight loss.  HENT:  congestion, ear discharge, ear pain, hearing loss, nosebleeds, sinus pain, sore throat and tinnitus.   Eyes:  blurred vision, double vision, photophobia, pain, discharge and redness.  Respiratory:  cough, hemoptysis, sputum production, shortness of breath, wheezing and  stridor.   Cardiovascular:  chest pain, palpitations, orthopnea, claudication, leg swelling and PND.  Gastrointestinal:  abdominal pain, blood in stool, constipation, diarrhea, heartburn, melena, nausea and vomiting.  Genitourinary:  dysuria, flank pain, frequency, hematuria and urgency.  Musculoskeletal:   back pain, falls, joint pain, myalgias and neck pain.  Skin:   itching and rash.  Neurological:  dizziness, tingling, tremors, sensory change, speech change, focal weakness, seizures, loss of consciousness, weakness and headaches.  Endo/Heme/Allergies: environmental allergies and polydipsia. Does not bruise/bleed easily.  Psychiatric/Behavioral:  depression, hallucinations, memory loss, substance abuse and suicidal ideas. The patient is not nervous/anxious and does not have insomnia.      PROBLEMS,PMH, PSH, FH, prior meds, allergies, and SH were each reviewed and updated Patient Active Problem List   Diagnosis Date Noted   Anxiety disorder 10/06/2022   Dizziness 08/30/2022   Pulmonary artery hypertension 08/30/2022   Hot flashes, menopausal 08/30/2022   Low libido 08/30/2022   Nausea 08/30/2022   Diastolic dysfunction 08/30/2022   Post-menopause atrophic vaginitis 06/22/2022   Obesity, Class III, BMI 40-49.9 (morbid obesity) 10/08/2020   Chronic heart failure with preserved ejection fraction 11/08/2019   H/O TIA (transient ischemic attack) and stroke 05/01/2019   Hypertrophic cardiomyopathy 04/28/2019   Essential hypertension 09/26/2017  Past Medical History:  Diagnosis Date   Anxiety    Chest pain of uncertain etiology 09/26/2017   CHF (congestive heart failure)    Hot flashes, menopausal 08/30/2022   Tried OTC medications without help   Hypertension    Low libido 08/30/2022    Past Surgical History:  Procedure Laterality Date   ABDOMINAL HYSTERECTOMY  08/23/2003   dermoid cyst   Family History  Problem Relation Age of Onset   Cancer Mother 55       bone cancer    Hypertension Mother    Other Sister        Pipolar   Heart disease Paternal Uncle    Arthritis Paternal Uncle    Asthma Paternal Uncle    Hypertension Paternal Uncle    Cancer Maternal Grandmother        Lung cancer   Outpatient Medications Prior to Visit  Medication Sig Dispense Refill   aspirin EC 81 MG tablet Take 1 tablet (81 mg total) by mouth daily. Swallow whole. 90 tablet 3   conjugated estrogens (PREMARIN) vaginal cream Place 0.5 gm vaginally daily for 3 weeks, 1 week off, then repeat 30 g 3   diltiazem (CARDIZEM CD) 180 MG 24 hr capsule Take 1 capsule (180 mg total) by mouth daily. 90 capsule 3   DULoxetine (CYMBALTA) 60 MG capsule Take 1 capsule (60 mg total) by mouth daily. Replaces 30 mg dose 90 capsule 3   estradiol (ESTRACE) 0.1 MG/GM vaginal cream Limit use to minimum amount to control symptoms. 42.5 g 1   Fezolinetant (VEOZAH) 45 MG TABS Take 1 tablet (45 mg total) by mouth daily at 6 (six) AM. 30 tablet 11   furosemide (LASIX) 20 MG tablet Take 1 tablet (20 mg total) by mouth 2 (two) times daily as needed. 180 tablet 3   meclizine (ANTIVERT) 25 MG tablet Take 1 tablet (25 mg total) by mouth 3 (three) times daily as needed for dizziness. 30 tablet 1   metoprolol succinate (TOPROL-XL) 50 MG 24 hr tablet TAKE 1 TABLET (50 MG TOTAL) BY MOUTH DAILY. TAKE WITH OR IMMEDIATELY FOLLOWING A MEAL. 90 tablet 3   ondansetron (ZOFRAN-ODT) 4 MG disintegrating tablet Take 1 tablet (4 mg total) by mouth every 8 (eight) hours as needed for nausea or vomiting (for nausea from wegovy or other source). 20 tablet 0   spironolactone (ALDACTONE) 25 MG tablet Take 1 tablet (25 mg total) by mouth daily. 90 tablet 3   tirzepatide (ZEPBOUND) 2.5 MG/0.5ML Pen Inject 2.5 mg into the skin once a week. 2 mL 3   No facility-administered medications prior to visit.   I attest the medication list was reviewed and reconciled with her in accordance with the requirement.. No Known Allergies  Social History    Tobacco Use   Smoking status: Never   Smokeless tobacco: Never  Vaping Use   Vaping Use: Never used  Substance Use Topics   Alcohol use: Yes    Comment: 1 glass of wine every 3-4 months   Drug use: No        Objective:  BP 100/78 (BP Location: Left Arm, Patient Position: Sitting)   Pulse 63   Temp 97.9 F (36.6 C) (Temporal)   Ht  (1.626 m)   Wt 226 lb (102.5 kg)   SpO2 97%   BMI 38.79 kg/m  Body mass index is 38.79 kg/m.  Wt Readings from Last 3 Encounters:  12/06/22 226 lb (102.5 kg)  10/06/22 228  lb 6.4 oz (103.6 kg)  09/19/22 229 lb 3.2 oz (104 kg)    This is a polite, friendly, and genuine person  Constitutional: NAD, AAO, not ill-appearing  HENT:  NCAT, normal nose, mucous membranes moist.  Tympanic membrane evaluated and normal appearing bilaterally, oropharynx evaluated and normal appearing Eyes:  sclera nonicteric, no injection CV:  RRR, no murmurs/rubs/gallops Lung: CTAB, normal WOB, no audible wheezing or stridor Abd: soft, nondistended, no guarding, no palpable tumor Skin: warm, dry, no lesions of concern Neuro: alert, no focal deficit obvious, articulate speech, patellar reflexes equal Psych: normal mood, behavior, thought content  We considered pelvic exam but she completed just 2 years ago with gynecology.

## 2022-12-06 NOTE — Assessment & Plan Note (Signed)
Explained this is normal menopause If interested she could discuss with integrative medication(s) bio-identical hormone replacement, and can refer without addressing this as a medical issue today .

## 2022-12-21 ENCOUNTER — Other Ambulatory Visit: Payer: Self-pay

## 2022-12-21 ENCOUNTER — Encounter: Payer: Self-pay | Admitting: Internal Medicine

## 2022-12-21 NOTE — Telephone Encounter (Signed)
Pt called and wanted to add - since she has insurance now, she would like all the referrals that was talked about at her physical to be sent. Any questions, please call pt back with details. Please advise.

## 2022-12-26 NOTE — Telephone Encounter (Signed)
Patient called for an update on request.

## 2022-12-28 ENCOUNTER — Other Ambulatory Visit: Payer: Self-pay

## 2022-12-28 NOTE — Telephone Encounter (Signed)
Patient requests to be called for status of MyChart/telephone messages

## 2022-12-29 ENCOUNTER — Other Ambulatory Visit: Payer: Self-pay | Admitting: Internal Medicine

## 2022-12-29 ENCOUNTER — Telehealth: Payer: Self-pay

## 2022-12-29 ENCOUNTER — Other Ambulatory Visit (HOSPITAL_COMMUNITY): Payer: Self-pay

## 2022-12-29 ENCOUNTER — Other Ambulatory Visit: Payer: Self-pay

## 2022-12-29 DIAGNOSIS — I5032 Chronic diastolic (congestive) heart failure: Secondary | ICD-10-CM

## 2022-12-29 DIAGNOSIS — Z9189 Other specified personal risk factors, not elsewhere classified: Secondary | ICD-10-CM

## 2022-12-29 DIAGNOSIS — I422 Other hypertrophic cardiomyopathy: Secondary | ICD-10-CM

## 2022-12-29 DIAGNOSIS — Z8673 Personal history of transient ischemic attack (TIA), and cerebral infarction without residual deficits: Secondary | ICD-10-CM

## 2022-12-29 MED ORDER — TIRZEPATIDE 10 MG/0.5ML ~~LOC~~ SOAJ
10.0000 mg | SUBCUTANEOUS | 0 refills | Status: DC
Start: 2023-03-23 — End: 2023-01-03
  Filled 2022-12-29: qty 2, 28d supply, fill #0

## 2022-12-29 MED ORDER — TIRZEPATIDE 15 MG/0.5ML ~~LOC~~ SOAJ
15.0000 mg | SUBCUTANEOUS | 5 refills | Status: DC
Start: 2023-05-18 — End: 2023-01-03
  Filled 2022-12-29: qty 6, 84d supply, fill #0

## 2022-12-29 MED ORDER — TIRZEPATIDE 5 MG/0.5ML ~~LOC~~ SOAJ
5.0000 mg | SUBCUTANEOUS | 0 refills | Status: DC
Start: 2023-01-26 — End: 2023-01-03
  Filled 2022-12-29: qty 2, 28d supply, fill #0

## 2022-12-29 MED ORDER — TIRZEPATIDE 12.5 MG/0.5ML ~~LOC~~ SOAJ
12.5000 mg | SUBCUTANEOUS | 0 refills | Status: DC
Start: 2023-04-20 — End: 2023-01-03
  Filled 2022-12-29: qty 2, 28d supply, fill #0

## 2022-12-29 MED ORDER — TIRZEPATIDE 2.5 MG/0.5ML ~~LOC~~ SOAJ
2.5000 mg | SUBCUTANEOUS | 0 refills | Status: DC
Start: 2022-12-29 — End: 2023-01-03
  Filled 2022-12-29: qty 2, 28d supply, fill #0

## 2022-12-29 MED ORDER — TIRZEPATIDE 7.5 MG/0.5ML ~~LOC~~ SOAJ
7.5000 mg | SUBCUTANEOUS | 0 refills | Status: DC
Start: 2023-02-23 — End: 2023-01-03
  Filled 2022-12-29: qty 2, 28d supply, fill #0

## 2022-12-29 NOTE — Telephone Encounter (Signed)
Pharmacy Patient Advocate Encounter   Received notification that prior authorization for Mounjaro 2.5MG /0.5ML pen-injectors is required/requested.   PA submitted on 12/29/22 to (ins) [employer] via [employer] or [facility]) confirmation # [id number] Status is pending

## 2022-12-29 NOTE — Progress Notes (Unsigned)
Patient requests Greggory Keen or Ozempic stating insurance covers these with prior authorization, so ordered Mounjaro.

## 2022-12-30 ENCOUNTER — Other Ambulatory Visit: Payer: Self-pay

## 2022-12-30 NOTE — Telephone Encounter (Signed)
Pharmacy Patient Advocate Encounter  Received notification from [employer] that the request for prior authorization for Mounjaro 2.5MG /0.5ML pen-injectors has been denied due to trying the required number of formulary alternatives. Formulary alternative(s) are Victoza, Trulicity. Requirement:  2 in a class with 2 alternatives.   Please be advised we currently do not have a Pharmacist to review denials, therefore you will need to process appeals accordingly as needed. Thanks for your support at this time.   You may call [phone] or fax [phone], to appeal.

## 2023-01-02 ENCOUNTER — Other Ambulatory Visit: Payer: Self-pay

## 2023-01-03 ENCOUNTER — Other Ambulatory Visit: Payer: Self-pay

## 2023-01-03 DIAGNOSIS — I5032 Chronic diastolic (congestive) heart failure: Secondary | ICD-10-CM

## 2023-01-03 DIAGNOSIS — I422 Other hypertrophic cardiomyopathy: Secondary | ICD-10-CM

## 2023-01-03 DIAGNOSIS — Z9189 Other specified personal risk factors, not elsewhere classified: Secondary | ICD-10-CM

## 2023-01-03 DIAGNOSIS — Z8673 Personal history of transient ischemic attack (TIA), and cerebral infarction without residual deficits: Secondary | ICD-10-CM

## 2023-01-03 MED ORDER — TRULICITY 0.75 MG/0.5ML ~~LOC~~ SOAJ
0.7500 mg | SUBCUTANEOUS | 0 refills | Status: DC
Start: 2023-01-03 — End: 2023-02-16
  Filled 2023-01-03 – 2023-01-04 (×2): qty 2, 28d supply, fill #0

## 2023-01-03 MED ORDER — TRULICITY 4.5 MG/0.5ML ~~LOC~~ SOAJ
4.5000 mg | SUBCUTANEOUS | 0 refills | Status: DC
Start: 2023-03-27 — End: 2023-07-09
  Filled 2023-01-03: qty 2, 28d supply, fill #0

## 2023-01-03 MED ORDER — TRULICITY 1.5 MG/0.5ML ~~LOC~~ SOAJ
1.5000 mg | SUBCUTANEOUS | 0 refills | Status: DC
Start: 2023-01-30 — End: 2023-03-24
  Filled 2023-01-03 – 2023-02-27 (×4): qty 2, 28d supply, fill #0

## 2023-01-03 MED ORDER — TRULICITY 3 MG/0.5ML ~~LOC~~ SOAJ
3.0000 mg | SUBCUTANEOUS | 0 refills | Status: DC
Start: 2023-02-27 — End: 2023-07-09
  Filled 2023-01-03 – 2023-03-25 (×2): qty 2, 28d supply, fill #0

## 2023-01-03 NOTE — Telephone Encounter (Signed)
Sent in all Trulicity dosages to pharmacy on file and informed patient.

## 2023-01-04 ENCOUNTER — Other Ambulatory Visit: Payer: Self-pay

## 2023-01-09 ENCOUNTER — Other Ambulatory Visit: Payer: Self-pay

## 2023-01-10 ENCOUNTER — Other Ambulatory Visit: Payer: Self-pay

## 2023-01-11 ENCOUNTER — Other Ambulatory Visit: Payer: Self-pay

## 2023-01-12 ENCOUNTER — Other Ambulatory Visit: Payer: Self-pay

## 2023-01-12 ENCOUNTER — Telehealth: Payer: Self-pay

## 2023-01-12 NOTE — Telephone Encounter (Signed)
PA initiated via Covermymeds; KEY: [id number]. Awaiting determination.

## 2023-01-13 NOTE — Telephone Encounter (Signed)
PA approved.

## 2023-01-17 ENCOUNTER — Other Ambulatory Visit: Payer: Self-pay

## 2023-01-17 NOTE — Telephone Encounter (Signed)
Patient called requesting an update on PA. Requesting a callback to [phone].

## 2023-01-19 NOTE — Telephone Encounter (Signed)
FYI,  I called pt and let her know PA was approved, LVM.

## 2023-01-26 ENCOUNTER — Other Ambulatory Visit: Payer: Self-pay

## 2023-01-30 ENCOUNTER — Other Ambulatory Visit: Payer: Self-pay

## 2023-02-09 ENCOUNTER — Other Ambulatory Visit: Payer: Self-pay

## 2023-02-10 ENCOUNTER — Other Ambulatory Visit: Payer: Self-pay

## 2023-02-16 ENCOUNTER — Other Ambulatory Visit: Payer: Self-pay | Admitting: Internal Medicine

## 2023-02-16 DIAGNOSIS — Z9189 Other specified personal risk factors, not elsewhere classified: Secondary | ICD-10-CM

## 2023-02-16 DIAGNOSIS — I5032 Chronic diastolic (congestive) heart failure: Secondary | ICD-10-CM

## 2023-02-16 DIAGNOSIS — Z8673 Personal history of transient ischemic attack (TIA), and cerebral infarction without residual deficits: Secondary | ICD-10-CM

## 2023-02-16 DIAGNOSIS — I422 Other hypertrophic cardiomyopathy: Secondary | ICD-10-CM

## 2023-02-17 ENCOUNTER — Other Ambulatory Visit: Payer: Self-pay

## 2023-02-18 ENCOUNTER — Other Ambulatory Visit (HOSPITAL_COMMUNITY): Payer: Self-pay

## 2023-02-18 MED ORDER — TRULICITY 0.75 MG/0.5ML ~~LOC~~ SOAJ
0.7500 mg | SUBCUTANEOUS | 0 refills | Status: DC
Start: 2023-02-18 — End: 2023-07-09
  Filled 2023-02-18 – 2023-07-06 (×4): qty 2, 28d supply, fill #0

## 2023-02-24 ENCOUNTER — Other Ambulatory Visit: Payer: Self-pay

## 2023-02-27 ENCOUNTER — Other Ambulatory Visit: Payer: Self-pay

## 2023-02-28 ENCOUNTER — Other Ambulatory Visit (HOSPITAL_COMMUNITY): Payer: Self-pay

## 2023-03-02 ENCOUNTER — Other Ambulatory Visit (HOSPITAL_COMMUNITY): Payer: Self-pay

## 2023-03-03 ENCOUNTER — Other Ambulatory Visit (HOSPITAL_COMMUNITY): Payer: Self-pay

## 2023-03-08 ENCOUNTER — Other Ambulatory Visit (HOSPITAL_COMMUNITY): Payer: Self-pay

## 2023-03-22 ENCOUNTER — Encounter (INDEPENDENT_AMBULATORY_CARE_PROVIDER_SITE_OTHER): Payer: Self-pay

## 2023-03-24 ENCOUNTER — Other Ambulatory Visit: Payer: Self-pay

## 2023-03-24 ENCOUNTER — Other Ambulatory Visit: Payer: Self-pay | Admitting: Internal Medicine

## 2023-03-24 DIAGNOSIS — I5032 Chronic diastolic (congestive) heart failure: Secondary | ICD-10-CM

## 2023-03-24 DIAGNOSIS — Z9189 Other specified personal risk factors, not elsewhere classified: Secondary | ICD-10-CM

## 2023-03-24 DIAGNOSIS — I422 Other hypertrophic cardiomyopathy: Secondary | ICD-10-CM

## 2023-03-24 DIAGNOSIS — Z8673 Personal history of transient ischemic attack (TIA), and cerebral infarction without residual deficits: Secondary | ICD-10-CM

## 2023-03-24 MED ORDER — TRULICITY 1.5 MG/0.5ML ~~LOC~~ SOAJ
1.5000 mg | SUBCUTANEOUS | 0 refills | Status: DC
  Filled 2023-03-24 – 2023-04-13 (×3): qty 2, 28d supply, fill #0

## 2023-03-27 ENCOUNTER — Other Ambulatory Visit: Payer: Self-pay | Admitting: Oncology

## 2023-03-27 ENCOUNTER — Other Ambulatory Visit: Payer: Self-pay

## 2023-03-27 DIAGNOSIS — Z006 Encounter for examination for normal comparison and control in clinical research program: Secondary | ICD-10-CM

## 2023-03-29 ENCOUNTER — Other Ambulatory Visit: Payer: Self-pay

## 2023-03-29 MED ORDER — TRULICITY 3 MG/0.5ML ~~LOC~~ SOAJ
SUBCUTANEOUS | 1 refills | Status: DC
  Filled 2023-03-29: qty 2, 28d supply, fill #0

## 2023-03-31 ENCOUNTER — Other Ambulatory Visit: Payer: Self-pay

## 2023-04-03 ENCOUNTER — Other Ambulatory Visit: Payer: Self-pay

## 2023-04-12 ENCOUNTER — Other Ambulatory Visit: Payer: Self-pay

## 2023-04-14 ENCOUNTER — Other Ambulatory Visit: Payer: Self-pay

## 2023-04-14 ENCOUNTER — Encounter: Payer: Self-pay | Admitting: Internal Medicine

## 2023-04-14 ENCOUNTER — Ambulatory Visit (INDEPENDENT_AMBULATORY_CARE_PROVIDER_SITE_OTHER): Payer: 59 | Admitting: Internal Medicine

## 2023-04-14 VITALS — BP 100/80 | HR 74 | Temp 97.8°F | Ht 64.0 in | Wt 225.0 lb

## 2023-04-14 DIAGNOSIS — I422 Other hypertrophic cardiomyopathy: Secondary | ICD-10-CM

## 2023-04-14 DIAGNOSIS — Z6838 Body mass index (BMI) 38.0-38.9, adult: Secondary | ICD-10-CM

## 2023-04-14 DIAGNOSIS — N951 Menopausal and female climacteric states: Secondary | ICD-10-CM | POA: Diagnosis not present

## 2023-04-14 MED ORDER — TOPIRAMATE 25 MG PO TABS
25.0000 mg | ORAL_TABLET | Freq: Two times a day (BID) | ORAL | 3 refills | Status: DC
Start: 2023-04-14 — End: 2023-07-09
  Filled 2023-04-14 – 2023-05-16 (×5): qty 60, 30d supply, fill #0

## 2023-04-14 MED ORDER — SEMAGLUTIDE-WEIGHT MANAGEMENT 0.25 MG/0.5ML ~~LOC~~ SOAJ
0.2500 mg | SUBCUTANEOUS | 0 refills | Status: AC
Start: 2023-04-14 — End: 2023-05-12

## 2023-04-14 MED ORDER — VEOZAH 45 MG PO TABS
1.0000 | ORAL_TABLET | Freq: Every day | ORAL | 11 refills | Status: DC
Start: 2023-04-14 — End: 2023-07-31
  Filled 2023-04-14 – 2023-07-06 (×5): qty 30, 30d supply, fill #0

## 2023-04-14 MED ORDER — ZEPBOUND 2.5 MG/0.5ML ~~LOC~~ SOAJ: SUBCUTANEOUS | 3 refills | Status: DC

## 2023-04-14 MED ORDER — METFORMIN HCL 500 MG PO TABS
500.0000 mg | ORAL_TABLET | Freq: Two times a day (BID) | ORAL | 3 refills | Status: DC
Start: 2023-04-14 — End: 2023-07-09
  Filled 2023-04-14 – 2023-05-16 (×5): qty 60, 30d supply, fill #0

## 2023-04-14 MED ORDER — SEMAGLUTIDE-WEIGHT MANAGEMENT 0.5 MG/0.5ML ~~LOC~~ SOAJ
0.5000 mg | SUBCUTANEOUS | 0 refills | Status: AC
Start: 2023-05-13 — End: 2023-06-10

## 2023-04-14 MED ORDER — SEMAGLUTIDE-WEIGHT MANAGEMENT 1 MG/0.5ML ~~LOC~~ SOAJ
1.0000 mg | SUBCUTANEOUS | 0 refills | Status: DC
Start: 2023-06-11 — End: 2023-07-09

## 2023-04-14 MED ORDER — SEMAGLUTIDE-WEIGHT MANAGEMENT 1.7 MG/0.75ML ~~LOC~~ SOAJ
1.7000 mg | SUBCUTANEOUS | 0 refills | Status: DC
Start: 2023-07-10 — End: 2023-07-09

## 2023-04-14 MED ORDER — SEMAGLUTIDE-WEIGHT MANAGEMENT 2.4 MG/0.75ML ~~LOC~~ SOAJ: 2.4000 mg | SUBCUTANEOUS | 0 refills | Status: DC

## 2023-04-14 NOTE — Assessment & Plan Note (Signed)
Weight Management We discussed various weight loss medications, including Trulicity, Wegovy, and Zepbound. She expressed concern about the cost of Trulicity and the potential for obtaining Zepbound through a compounding pharmacy. We will write a prescription for [facility] for weight loss and provide a blank prescription for Zepbound for her to use with an online pharmacy.   She also taking will trial off label topiramate metformin as nondiabetic.

## 2023-04-14 NOTE — Assessment & Plan Note (Addendum)
She is having some ctrl but incomplete with Veozah. Due to estrogen not option with stroke history, advised patient stay on veozah

## 2023-04-14 NOTE — Patient Instructions (Signed)
VISIT SUMMARY:  During our appointment, we discussed your weight management, hot flashes and tried to find affordable solutions to these complex medical issues.   YOUR PLAN:  -WEIGHT MANAGEMENT: We discussed different weight loss medications. We will prescribe Wegovy for weight loss and provide a prescription for Zepbound that you can use with an online pharmacy. These medications can help you manage your weight. We have ordered topiramate and metformin as well for you to try.  -COLON CANCER SCREENING: We discussed the importance of using your home colon cancer screening kit. This test can help detect colon cancer early. We encourage you to complete this test.  -HOT FLASHES: We discussed the use of Veozah for managing your hot flashes. We will continue  this medication to help reduce your symptoms.  INSTRUCTIONS:  Please start taking the new medications as prescribed. Use the home colon cancer screening kit as soon as possible.

## 2023-04-14 NOTE — Assessment & Plan Note (Signed)
We discussed today and she reports this was likely due to years of phentermine so we will avoid that for weight loss.

## 2023-04-14 NOTE — Progress Notes (Signed)
Marland KitchenMarland KitchenMarland KitchenMarland KitchenMarland KitchenMarland KitchenMarland Kitchen [facility]: [phone]   -- Routine Medical Office Visit --  Patient:  Lydia Thompson      Age: 47 y.o.       Sex:  female  Date:   04/14/2023 Patient Care Team: Lula Olszewski, MD as PCP - General (Internal Medicine) Elder Negus, MD as Consulting Physician (Cardiology) Lynnell Catalan, MD (Gynecology) Drema Dallas, DO as Consulting Physician (Neurology) Yates Decamp, MD as Consulting Physician (Cardiology) Today's Healthcare Provider: Lula Olszewski, MD      Assessment & Plan Obesity, Class III, BMI 40-49.9 (morbid obesity) (HCC) Weight Management We discussed various weight loss medications, including Trulicity, Wegovy, and Zepbound. She expressed concern about the cost of Trulicity and the potential for obtaining Zepbound through a compounding pharmacy. We will write a prescription for [facility] for weight loss and provide a blank prescription for Zepbound for her to use with an online pharmacy.   She also taking will trial off label topiramate metformin as nondiabetic. Hypertrophic cardiomyopathy (HCC) We discussed today and she reports this was likely due to years of phentermine so we will avoid that for weight loss. Hot flashes, menopausal She is having some ctrl but incomplete with Veozah. Due to estrogen not option with stroke history, advised patient stay on veozah   Colon Cancer Screening She has a home colon cancer screening kit but has not used it. We discussed the process of using the kit. We encourage her to complete the home colon cancer screening test.         ED Discharge Orders          Ordered    Semaglutide-Weight Management 2.4 MG/0.75ML SOAJ  Weekly        04/14/23 1006    Semaglutide-Weight Management 1.7 MG/0.75ML SOAJ  Weekly        04/14/23 1006    Semaglutide-Weight Management 1 MG/0.5ML SOAJ  Weekly        04/14/23 1006    Semaglutide-Weight Management 0.5 MG/0.5ML SOAJ  Weekly        04/14/23  1006    tirzepatide (ZEPBOUND) 2.5 MG/0.5ML Pen  Weekly        04/14/23 1006    Semaglutide-Weight Management 0.25 MG/0.5ML SOAJ  Weekly        04/14/23 1006    topiramate (TOPAMAX) 25 MG tablet  2 times daily        04/14/23 1018    metFORMIN (GLUCOPHAGE) 500 MG tablet  2 times daily with meals        04/14/23 1018    Fezolinetant (VEOZAH) 45 MG TABS  Daily       Note to Pharmacy: Ok to convert to 90 tab 3 refill if preferred Ok to use savings card if insurance refuses   04/14/23 1035          Recommended follow up: No follow-ups on file. Future Appointments  Date Time Provider Department Center  09/01/2023  9:30 AM Elder Negus, MD [facility] None  12/08/2023  8:40 AM Lula Olszewski, MD [facility]            Subjective   47 y.o. female who has Essential hypertension; Hypertrophic cardiomyopathy (HCC); H/O TIA (transient ischemic attack) and stroke; Chronic heart failure with preserved ejection fraction (HCC); Obesity, Class III, BMI 40-49.9 (morbid obesity) (HCC); Post-menopause atrophic vaginitis; Dizziness; Pulmonary artery hypertension (HCC); Hot flashes, menopausal; Low libido; Nausea; Diastolic dysfunction; and Anxiety disorder on their problem list. Her  reasons/main concerns/chief complaints for today's office visit are 4 month follow-up (Discuss replacement for Trulicity.)   ------------------------------------------------------------------------------------------------------------------------ AI-Extracted: Discussed the use of AI scribe software for clinical note transcription with the patient, who gave verbal consent to proceed.  History of Present Illness   The patient, with a history of hypertension and hyperlipidemia, presents for a consultation regarding weight management and hot flashes. The patient has been on Trulicity, a medication typically used for diabetes, for weight loss purposes. However, due to the high cost and the patient's non-diabetic status,  discontinuation of Trulicity is being considered. The patient reports no history of diabetes or prediabetes.  The patient has been experiencing hot flashes, which have been managed with Veozah, a medication that the patient reports as effective. The patient also takes venlafaxine (Effexor XR) 150mg , which she describes as calming, although she is unsure of its specific benefits. The patient reports a noticeable difference when the medication is not taken.  The patient also mentions a history of transient ischemic attack (TIA), which has led to the continued use of low-dose aspirin despite associated bruising. The patient acknowledges the importance of aspirin in reducing the risk of a full stroke.       ------------------------------------------------------------------------------------------------------------------------ She has a past medical history of Anxiety, Chest pain of uncertain etiology (09/26/2017), CHF (congestive heart failure) (HCC), Hot flashes, menopausal (08/30/2022), Hypertension, and Low libido (08/30/2022).  Problem list overviews that were updated at today's visit: Problem  Hot Flashes, Menopausal   Tried OTC medications without help Premarin went up to 430$   Hypertrophic Cardiomyopathy (Hcc)   With history of heart failure hospitalization...........    Current Outpatient Medications on File Prior to Visit  Medication Sig   aspirin EC 81 MG tablet Take 1 tablet (81 mg total) by mouth daily. Swallow whole.   conjugated estrogens (PREMARIN) vaginal cream Place 0.5 gm vaginally daily for 3 weeks, 1 week off, then repeat   diltiazem (CARDIZEM CD) 180 MG 24 hr capsule Take 1 capsule (180 mg total) by mouth daily.   DULoxetine (CYMBALTA) 60 MG capsule Take 1 capsule (60 mg total) by mouth daily. Replaces 30 mg dose   estradiol (ESTRACE) 0.1 MG/GM vaginal cream Limit use to minimum amount to control symptoms.   furosemide (LASIX) 20 MG tablet Take 1 tablet (20 mg total) by  mouth 2 (two) times daily as needed.   meclizine (ANTIVERT) 25 MG tablet Take 1 tablet (25 mg total) by mouth 3 (three) times daily as needed for dizziness.   metoprolol succinate (TOPROL-XL) 50 MG 24 hr tablet TAKE 1 TABLET (50 MG TOTAL) BY MOUTH DAILY. TAKE WITH OR IMMEDIATELY FOLLOWING A MEAL.   ondansetron (ZOFRAN-ODT) 4 MG disintegrating tablet Take 1 tablet (4 mg total) by mouth every 8 (eight) hours as needed for nausea or vomiting (for nausea from wegovy or other source).   spironolactone (ALDACTONE) 25 MG tablet Take 1 tablet (25 mg total) by mouth daily.   Dulaglutide (TRULICITY) 0.75 MG/0.5ML SOPN Inject 0.75 mg into the skin once a week. (Patient not taking: Reported on 04/14/2023)   Dulaglutide (TRULICITY) 3 MG/0.5ML SOPN Inject 3 mg as directed once a week. (Patient not taking: Reported on 04/14/2023)   Dulaglutide (TRULICITY) 4.5 MG/0.5ML SOPN Inject 4.5 mg as directed once a week. (Patient not taking: Reported on 04/14/2023)   [DISCONTINUED] diltiazem (DILACOR XR) 180 MG 24 hr capsule Take 1 capsule (180 mg total) by mouth daily.   [DISCONTINUED] isosorbide mononitrate (IMDUR) 30 MG 24 hr tablet  Take 1 tablet (30 mg total) by mouth daily.   No current facility-administered medications on file prior to visit.   Medications Discontinued During This Encounter  Medication Reason   Fezolinetant (VEOZAH) 45 MG TABS Reorder   Dulaglutide (TRULICITY) 1.5 MG/0.5ML SOPN Not covered by the pt's insurance        Objective    Physical Exam  BP 100/80 (BP Location: Left Arm, Patient Position: Sitting)   Pulse 74   Temp 97.8 F (36.6 C) (Temporal)   Ht 5\' 4"  (1.626 m)   Wt 225 lb (102.1 kg)   SpO2 98%   BMI 38.62 kg/m  Wt Readings from Last 10 Encounters:  04/14/23 225 lb (102.1 kg)  12/06/22 226 lb (102.5 kg)  10/06/22 228 lb 6.4 oz (103.6 kg)  09/19/22 229 lb 3.2 oz (104 kg)  09/01/22 234 lb (106.1 kg)  08/30/22 232 lb (105.2 kg)  06/02/22 236 lb (107 kg)  08/06/21 231 lb 9.6  oz (105.1 kg)  05/28/21 236 lb (107 kg)  05/14/21 232 lb 0.4 oz (105.2 kg)   Vital signs reviewed.  Nursing notes reviewed. Weight trend reviewed. Abnormalities and Problem-Specific physical exam findings:  slight weight improvement noted this year.  General Appearance:  No acute distress appreciable.   Well-groomed, healthy-appearing female.  Well proportioned with no abnormal fat distribution.  Good muscle tone. Pulmonary:  Normal work of breathing at rest, no respiratory distress apparent. SpO2: 98 %  Musculoskeletal: All extremities are intact.  Neurological:  Awake, alert, oriented, and engaged.  No obvious focal neurological deficits or cognitive impairments.  Sensorium seems unclouded.   Speech is clear and coherent with logical content. Psychiatric:  Appropriate mood, pleasant and cooperative demeanor, thoughtful and engaged during the exam  Results            No results found for any visits on 04/14/23.  Office Visit on 12/06/2022  Component Date Value   Cholesterol 12/06/2022 182    Triglycerides 12/06/2022 95.0    HDL 12/06/2022 34.80 (L)    VLDL 12/06/2022 19.0    LDL Cholesterol 12/06/2022 128 (H)    Total CHOL/HDL Ratio 12/06/2022 5    NonHDL 12/06/2022 146.81    TSH 12/06/2022 2.38    Sodium 12/06/2022 135    Potassium 12/06/2022 4.0    Chloride 12/06/2022 98    CO2 12/06/2022 29    Glucose, Bld 12/06/2022 101 (H)    BUN 12/06/2022 17    Creatinine, Ser 12/06/2022 0.87    Total Bilirubin 12/06/2022 0.7    Alkaline Phosphatase 12/06/2022 70    AST 12/06/2022 16    ALT 12/06/2022 22    Total Protein 12/06/2022 7.8    Albumin 12/06/2022 4.4    GFR 12/06/2022 80.42    Calcium 12/06/2022 9.4    WBC 12/06/2022 10.5    RBC 12/06/2022 5.08    Hemoglobin 12/06/2022 14.1    HCT 12/06/2022 41.9    MCV 12/06/2022 82.5    MCHC 12/06/2022 33.6    RDW 12/06/2022 14.6    Platelets 12/06/2022 315.0    Neutrophils Relative % 12/06/2022 66.0    Lymphocytes Relative  12/06/2022 24.0    Monocytes Relative 12/06/2022 6.6    Eosinophils Relative 12/06/2022 2.8    Basophils Relative 12/06/2022 0.6    Neutro Abs 12/06/2022 6.9    Lymphs Abs 12/06/2022 2.5    Monocytes Absolute 12/06/2022 0.7    Eosinophils Absolute 12/06/2022 0.3    Basophils Absolute 12/06/2022 0.1  Hgb A1c MFr Bld 12/06/2022 5.5    Magnesium 12/06/2022 2.2    No image results found.   No results found.  No results found.      Additional Notes:   This encounter employed real-time, collaborative documentation. The patient actively reviewed and updated their medical record on a shared screen, ensuring transparency and facilitating joint problem-solving for the problem list, overview, and plan. This approach promotes accurate, informed care. The treatment plan was discussed and reviewed in detail, including medication safety, potential side effects, and all patient questions. We confirmed understanding and comfort with the plan. Follow-up instructions were established, including contacting the office for any concerns, returning if symptoms worsen, persist, or new symptoms develop, and precautions for potential emergency department visits. ----------------------------------------------------- Lula Olszewski, MD  04/14/2023 7:36 PM  [facility]:  [phone]

## 2023-04-17 ENCOUNTER — Other Ambulatory Visit: Payer: Self-pay

## 2023-04-21 ENCOUNTER — Other Ambulatory Visit: Payer: Self-pay

## 2023-04-25 ENCOUNTER — Other Ambulatory Visit: Payer: Self-pay

## 2023-05-01 ENCOUNTER — Other Ambulatory Visit: Payer: Self-pay

## 2023-05-02 ENCOUNTER — Other Ambulatory Visit: Payer: Self-pay

## 2023-05-03 DIAGNOSIS — Z1231 Encounter for screening mammogram for malignant neoplasm of breast: Secondary | ICD-10-CM | POA: Diagnosis not present

## 2023-05-03 LAB — HM MAMMOGRAPHY

## 2023-05-08 ENCOUNTER — Other Ambulatory Visit: Payer: Self-pay

## 2023-05-09 ENCOUNTER — Other Ambulatory Visit: Payer: Self-pay

## 2023-05-11 ENCOUNTER — Other Ambulatory Visit: Payer: Self-pay

## 2023-05-15 ENCOUNTER — Other Ambulatory Visit: Payer: Self-pay

## 2023-05-16 ENCOUNTER — Other Ambulatory Visit: Payer: Self-pay

## 2023-05-22 ENCOUNTER — Other Ambulatory Visit: Payer: Self-pay

## 2023-05-25 ENCOUNTER — Other Ambulatory Visit: Payer: Self-pay

## 2023-05-31 ENCOUNTER — Other Ambulatory Visit: Payer: Self-pay

## 2023-06-01 ENCOUNTER — Other Ambulatory Visit: Payer: Self-pay

## 2023-06-01 ENCOUNTER — Telehealth: Payer: Self-pay

## 2023-06-01 ENCOUNTER — Telehealth: Payer: Self-pay | Admitting: Internal Medicine

## 2023-06-01 ENCOUNTER — Other Ambulatory Visit (HOSPITAL_COMMUNITY): Payer: Self-pay

## 2023-06-01 NOTE — Telephone Encounter (Signed)
Pt states the RX  Fezolinetant (VEOZAH) 45 MG TABS  Needs a PA. Please advise.

## 2023-06-01 NOTE — Telephone Encounter (Signed)
Pharmacy Patient Advocate Encounter   Received notification from Pt Calls Messages that prior authorization for Veozah 45mg  tabs is required/requested.   Insurance verification completed.   The patient is insured through [facility] .   Per test claim: PA required; PA submitted to [facility] via CoverMyMeds Key/confirmation #/EOC [id number] Status is pending

## 2023-06-05 NOTE — Telephone Encounter (Signed)
Pharmacy Patient Advocate Encounter  Received notification from [facility] that Prior Authorization for Veozah 45mg  tabs has been APPROVED from 06/01/23 to 05/31/24   PA #/Case ID/Reference #: [id number]  Approval letter indexed to media tab

## 2023-06-12 ENCOUNTER — Other Ambulatory Visit: Payer: Self-pay

## 2023-06-13 ENCOUNTER — Other Ambulatory Visit: Payer: Self-pay

## 2023-06-14 ENCOUNTER — Encounter (INDEPENDENT_AMBULATORY_CARE_PROVIDER_SITE_OTHER): Payer: 59 | Admitting: Internal Medicine

## 2023-06-20 ENCOUNTER — Other Ambulatory Visit: Payer: Self-pay

## 2023-06-20 DIAGNOSIS — N951 Menopausal and female climacteric states: Secondary | ICD-10-CM

## 2023-06-20 NOTE — Telephone Encounter (Signed)
MyChart Encounter Documentation Date: 06/20/2023  ENCOUNTER TYPE: MyChart secure digital messaging clinical encounter  CHIEF COMPLAINT: Request for alternative treatment options for menopausal hot flashes due to cost concerns with Veozah  RELEVANT HISTORY: - 47 year old female with history of TIA/stroke - Current hot flash management with Veozah showing partial control - Contraindication to estrogen therapy due to stroke history - Multiple comorbidities including hypertrophic cardiomyopathy, pulmonary hypertension, CHF - Currently on Cymbalta 60mg  daily  ASSESSMENT: Menopausal hot flashes with: 1. Cost barrier to current therapy Allyne Gee) 2. Contraindication to estrogen therapy due to stroke history 3. Partial response to current treatment  PLAN: 1. Provided comprehensive options for hot flash management:    - Primary recommendation: Pursue Veozah savings program    - Secondary options: Consider paroxetine or Cymbalta dose adjustment    - Reviewed non-pharmacological management strategies 2. Offered nurse assistance with [employer]  savings program application 3. Provided ER precautions given complex cardiovascular history  PATIENT EDUCATION: - Reviewed treatment options and contraindications - Discussed lifestyle modifications - Provided emergency warning signs - Explained medication cost reduction strategies  FOLLOW-UP: - Patient to respond with preference for treatment approach - Recommended routine follow-up in 3 months or sooner if symptoms worsen  MEDICAL DECISION MAKING: Moderate complexity due to: - Multiple treatment options requiring careful consideration of stroke history - Need to balance efficacy with cost and safety - Coordination with pharmacy benefits and manufacturer programs  Please see the MyChart message reply(ies) for my assessment and plan.  This patient gave consent for this Medical Advice Message and is aware that it may result in a bill to [employer], as well as the possibility of receiving a bill for a co-payment or deductible. They are an established patient, but are not seeking medical advice exclusively about a problem treated during an in-person or video visit in the last seven days. I did not recommend an in-person or video visit within seven days of my reply.  I spent a total of 12 minutes cumulative time within 7 days through [employer].  Lula Olszewski, MD

## 2023-06-30 ENCOUNTER — Other Ambulatory Visit: Payer: Self-pay

## 2023-07-05 ENCOUNTER — Encounter: Payer: Self-pay | Admitting: Cardiology

## 2023-07-05 ENCOUNTER — Telehealth: Payer: Self-pay | Admitting: Cardiology

## 2023-07-05 NOTE — Telephone Encounter (Signed)
Was able to make contact with the pt.   Pt states since this past Sunday she has been experiencing fast heart rate when she exerts.  She states this causes her to have chest discomfort.   She states when she walks to and from the mailbox her HR on her apple watch can get to 130 bpm.  She reports at rest it is in the high 90s.  She states she has no lower extremity edema or acute weight gain. She does monitor this daily.  She voiced no increased sob with exertion.   She denied orthopnea, dizziness, N/V, diaphoresis, pre-syncope or syncope.   She confirmed she is taking all her cardiac meds as prescribed.  She mentioned she has to utilize her PRN lasix occasionally, when she experiences swelling.  Again she confirmed no bilateral LEE today.   Pt states her PCP has been working with her on managing her hot flashes and starting her on new meds for this. She reports since starting those meds, that's around the time her symptoms began.    Pt states she would like to be seen by our office for complaints.   Scheduled the pt an appt to see Ronie Spies PA-C for this Friday 11/15 at 1:55 pm.  Advised her to arrive 15 mins prior to this appt.  Pt aware of new office location/address.   ED precautions provided to the pt if symptoms were to worsen/persist between now and her appt Friday.  Advised her to continue monitoring her HR/BP/Weights at home, and log these.  Advised her to bring those recordings into the office visit with Dayna on 11/15.   Advised her to continue her current med regimen and PRN lasix.  Also advised her to start wearing compression stockings during the day to promote good circulation and help with swelling, if that occurs.  Pt is aware that I will send this message to Dr. Rosemary Holms to make him aware of this plan.  Pt verbalized understanding and agrees with this plan.  Pt was more than gracious for all the assistance provided.

## 2023-07-05 NOTE — Telephone Encounter (Signed)
See telephone encounter for further details about this message.  Pt will see Ronie Spies PA-C this Friday 11/15 at 1:55 pm, for complaints.

## 2023-07-05 NOTE — Telephone Encounter (Signed)
   Pt c/o of Chest Pain: STAT if active CP, including tightness, pressure, jaw pain, radiating pain to shoulder/upper arm/back, CP unrelieved by Nitro. Symptoms reported of SOB, nausea, vomiting, sweating.  1. Are you having CP right now?   No   2. Are you experiencing any other symptoms (ex. SOB, nausea, vomiting, sweating)?   SOB  3. Is your CP continuous or coming and going?   Coming and going  4. Have you taken Nitroglycerin?   No  5. How long have you been experiencing CP?   Since Sunday   6. If NO CP at time of call then end call with telling Pt to call back or call 911 if Chest pain returns prior to return call from triage team.   Patient stated she is concerned about the chest pains she has been having.  Patient stated her HR is 130 (per her Apple Watch) and she has taken an extra fluid pill.  Patient wants advice on next steps.

## 2023-07-05 NOTE — Telephone Encounter (Signed)
Attempted phone call to [phone].  Call went directly to voicemail.  No voicemail message left at this time.

## 2023-07-05 NOTE — Telephone Encounter (Signed)
Attempted phone call to pt and left voicemail to contact triage at [phone]. 

## 2023-07-05 NOTE — Progress Notes (Deleted)
   Cardiology Office Note    Date:  07/05/2023  ID:  Lydia Thompson, DOB February 04, 1976, MRN [id number] PCP:  Lula Olszewski, MD  Cardiologist:  None  Electrophysiologist:  None   Chief Complaint: ***  History of Present Illness: .    Lydia Thompson is a 47 y.o. female with visit-pertinent history of hypertrophic cardiomyopathy, chronic HFpEF, SVT, TIA, ILBBB, Covid, anxiety, moderate MR, mild TR, HTN seen for evaluation of elevated heart rate.  Echo in 2019 demonstrated EF 65-70% with severe LVH, G2DD + SAM. ETT in 12/2017 was abnormal with >2 mm T wave inversions leads II, III, aVF, V5,V 6. <1 mm ST elevation lead aVR. F/u cor CT 2020 showed CAC 0, no evidence of CAD, moderately dilated PA. Remote monitor 2019 for TIA reportedly showed brief PSVT but no afib or flutter. Dr Rosemary Holms has followed her for hyprtrophic cardiomyopathy and HFpEF. She had a prior admission requiring IV Lasix. Last echo 05/2022 showed EF 61%, severe concentric remodeling of the LVH, moderate MR, mild TR, previously noted LVOT gradient no longer present.  Labs cmri  Chest pain, palpitations Chronic HFpEF, moderate MR with HCM H/o PSVT  Labwork independently reviewed: ED today K 3.7, Cr 0.90, hsTroponin 45, WBC 17.6, Hgb 13.4, PLt ok, BNP*** 11/2022 Mg 2.2, CBC wnl, K 4.0, Cr 0.87, LFTs ok, TSH OK, LDL 128, trig 128 (PCP)  ROS: .    Please see the history of present illness. Otherwise, review of systems is positive for ***.  All other systems are reviewed and otherwise negative.  Studies Reviewed: Marland Kitchen    EKG:  EKG is ordered today, personally reviewed, demonstrating ***  CV Studies: Cardiac studies reviewed are outlined and summarized above. Otherwise please see EMR for full report.   Current Reported Medications:.    No outpatient medications have been marked as taking for the 07/07/23 encounter (Appointment) with Laurann Montana, PA-C.    Physical Exam:    VS:  There were no vitals taken for this visit.    Wt Readings from Last 3 Encounters:  04/14/23 225 lb (102.1 kg)  12/06/22 226 lb (102.5 kg)  10/06/22 228 lb 6.4 oz (103.6 kg)    GEN: Well nourished, well developed in no acute distress NECK: No JVD; No carotid bruits CARDIAC: ***RRR, no murmurs, rubs, gallops RESPIRATORY:  Clear to auscultation without rales, wheezing or rhonchi  ABDOMEN: Soft, non-tender, non-distended EXTREMITIES:  No edema; No acute deformity   Asessement and Plan:.     ***     Disposition: F/u with ***  Signed, Laurann Montana, PA-C

## 2023-07-06 NOTE — Telephone Encounter (Signed)
Sent my chart message informing patient of this approval.

## 2023-07-07 ENCOUNTER — Emergency Department (HOSPITAL_BASED_OUTPATIENT_CLINIC_OR_DEPARTMENT_OTHER): Payer: 59 | Admitting: Radiology

## 2023-07-07 ENCOUNTER — Other Ambulatory Visit: Payer: Self-pay

## 2023-07-07 ENCOUNTER — Inpatient Hospital Stay (HOSPITAL_BASED_OUTPATIENT_CLINIC_OR_DEPARTMENT_OTHER)
Admission: EM | Admit: 2023-07-07 | Discharge: 2023-07-09 | DRG: 291 | Disposition: A | Discharge status: Home / Self Care | Payer: 59 | Attending: Internal Medicine | Admitting: Internal Medicine

## 2023-07-07 ENCOUNTER — Telehealth: Payer: Self-pay | Admitting: Cardiology

## 2023-07-07 ENCOUNTER — Ambulatory Visit: Payer: 59 | Admitting: Physician Assistant

## 2023-07-07 ENCOUNTER — Encounter (HOSPITAL_BASED_OUTPATIENT_CLINIC_OR_DEPARTMENT_OTHER): Payer: Self-pay

## 2023-07-07 DIAGNOSIS — Z825 Family history of asthma and other chronic lower respiratory diseases: Secondary | ICD-10-CM

## 2023-07-07 DIAGNOSIS — D72829 Elevated white blood cell count, unspecified: Secondary | ICD-10-CM | POA: Diagnosis present

## 2023-07-07 DIAGNOSIS — J9601 Acute respiratory failure with hypoxia: Secondary | ICD-10-CM | POA: Diagnosis not present

## 2023-07-07 DIAGNOSIS — E876 Hypokalemia: Secondary | ICD-10-CM

## 2023-07-07 DIAGNOSIS — Z801 Family history of malignant neoplasm of trachea, bronchus and lung: Secondary | ICD-10-CM

## 2023-07-07 DIAGNOSIS — I5032 Chronic diastolic (congestive) heart failure: Secondary | ICD-10-CM

## 2023-07-07 DIAGNOSIS — Z8249 Family history of ischemic heart disease and other diseases of the circulatory system: Secondary | ICD-10-CM

## 2023-07-07 DIAGNOSIS — I110 Hypertensive heart disease with heart failure: Secondary | ICD-10-CM | POA: Diagnosis not present

## 2023-07-07 DIAGNOSIS — Z90710 Acquired absence of both cervix and uterus: Secondary | ICD-10-CM

## 2023-07-07 DIAGNOSIS — I5033 Acute on chronic diastolic (congestive) heart failure: Secondary | ICD-10-CM | POA: Diagnosis not present

## 2023-07-07 DIAGNOSIS — E66813 Obesity, class 3: Secondary | ICD-10-CM | POA: Diagnosis not present

## 2023-07-07 DIAGNOSIS — I2489 Other forms of acute ischemic heart disease: Secondary | ICD-10-CM | POA: Diagnosis present

## 2023-07-07 DIAGNOSIS — R0609 Other forms of dyspnea: Secondary | ICD-10-CM | POA: Diagnosis not present

## 2023-07-07 DIAGNOSIS — I10 Essential (primary) hypertension: Secondary | ICD-10-CM | POA: Diagnosis present

## 2023-07-07 DIAGNOSIS — I340 Nonrheumatic mitral (valve) insufficiency: Secondary | ICD-10-CM | POA: Diagnosis not present

## 2023-07-07 DIAGNOSIS — I471 Supraventricular tachycardia, unspecified: Secondary | ICD-10-CM

## 2023-07-07 DIAGNOSIS — Z7984 Long term (current) use of oral hypoglycemic drugs: Secondary | ICD-10-CM | POA: Diagnosis not present

## 2023-07-07 DIAGNOSIS — F418 Other specified anxiety disorders: Secondary | ICD-10-CM | POA: Diagnosis present

## 2023-07-07 DIAGNOSIS — Z6841 Body Mass Index (BMI) 40.0 and over, adult: Secondary | ICD-10-CM

## 2023-07-07 DIAGNOSIS — Z7982 Long term (current) use of aspirin: Secondary | ICD-10-CM | POA: Diagnosis not present

## 2023-07-07 DIAGNOSIS — Z8673 Personal history of transient ischemic attack (TIA), and cerebral infarction without residual deficits: Secondary | ICD-10-CM | POA: Diagnosis not present

## 2023-07-07 DIAGNOSIS — J811 Chronic pulmonary edema: Secondary | ICD-10-CM | POA: Diagnosis not present

## 2023-07-07 DIAGNOSIS — F419 Anxiety disorder, unspecified: Secondary | ICD-10-CM | POA: Diagnosis present

## 2023-07-07 DIAGNOSIS — Z79899 Other long term (current) drug therapy: Secondary | ICD-10-CM | POA: Diagnosis not present

## 2023-07-07 DIAGNOSIS — I509 Heart failure, unspecified: Principal | ICD-10-CM | POA: Insufficient documentation

## 2023-07-07 DIAGNOSIS — I421 Obstructive hypertrophic cardiomyopathy: Secondary | ICD-10-CM | POA: Diagnosis not present

## 2023-07-07 DIAGNOSIS — I5031 Acute diastolic (congestive) heart failure: Secondary | ICD-10-CM | POA: Diagnosis not present

## 2023-07-07 DIAGNOSIS — I422 Other hypertrophic cardiomyopathy: Secondary | ICD-10-CM

## 2023-07-07 DIAGNOSIS — R079 Chest pain, unspecified: Secondary | ICD-10-CM | POA: Diagnosis not present

## 2023-07-07 LAB — CBC
HCT: 41 % (ref 36.0–46.0)
Hemoglobin: 13.4 g/dL (ref 12.0–15.0)
MCH: 27.4 pg (ref 26.0–34.0)
MCHC: 32.7 g/dL (ref 30.0–36.0)
MCV: 83.8 fL (ref 80.0–100.0)
Platelets: 288 10*3/uL (ref 150–400)
RBC: 4.89 MIL/uL (ref 3.87–5.11)
RDW: 13.9 % (ref 11.5–15.5)
WBC: 17.6 10*3/uL — ABNORMAL HIGH (ref 4.0–10.5)
nRBC: 0 % (ref 0.0–0.2)

## 2023-07-07 LAB — BASIC METABOLIC PANEL
Anion gap: 10 (ref 5–15)
BUN: 20 mg/dL (ref 6–20)
CO2: 25 mmol/L (ref 22–32)
Calcium: 8.9 mg/dL (ref 8.9–10.3)
Chloride: 102 mmol/L (ref 98–111)
Creatinine, Ser: 0.9 mg/dL (ref 0.44–1.00)
GFR, Estimated: >60 mL/min (ref >60)
Glucose, Bld: 125 mg/dL — ABNORMAL HIGH (ref 70–99)
Potassium: 3.7 mmol/L (ref 3.5–5.1)
Sodium: 137 mmol/L (ref 135–145)

## 2023-07-07 LAB — BRAIN NATRIURETIC PEPTIDE: B Natriuretic Peptide: 468 pg/mL — ABNORMAL HIGH (ref 0.0–100.0)

## 2023-07-07 LAB — TROPONIN I (HIGH SENSITIVITY)
Troponin I (High Sensitivity): 45 ng/L — ABNORMAL HIGH (ref <18)
Troponin I (High Sensitivity): 41 ng/L — ABNORMAL HIGH (ref <18)

## 2023-07-07 LAB — HIV ANTIBODY (ROUTINE TESTING W REFLEX): HIV Screen 4th Generation wRfx: NONREACTIVE

## 2023-07-07 MED ORDER — FUROSEMIDE 10 MG/ML IJ SOLN
40.0000 mg | Freq: Once | INTRAMUSCULAR | Status: AC
  Administered 2023-07-07: 40 mg via INTRAVENOUS
  Filled 2023-07-07: qty 4

## 2023-07-07 MED ORDER — SENNOSIDES-DOCUSATE SODIUM 8.6-50 MG PO TABS
1.0000 | ORAL_TABLET | Freq: Every evening | ORAL | Status: DC | PRN
  Administered 2023-07-09: 1 via ORAL
  Filled 2023-07-07: qty 1

## 2023-07-07 MED ORDER — METOPROLOL SUCCINATE ER 50 MG PO TB24
50.0000 mg | ORAL_TABLET | Freq: Every day | ORAL | Status: DC
  Administered 2023-07-08 – 2023-07-09 (×2): 50 mg via ORAL
  Filled 2023-07-07 (×2): qty 1

## 2023-07-07 MED ORDER — ACETAMINOPHEN 500 MG PO TABS
1000.0000 mg | ORAL_TABLET | Freq: Once | ORAL | Status: AC
  Administered 2023-07-07: 1000 mg via ORAL
  Filled 2023-07-07: qty 2

## 2023-07-07 MED ORDER — FUROSEMIDE 10 MG/ML IJ SOLN
40.0000 mg | Freq: Two times a day (BID) | INTRAMUSCULAR | Status: DC
  Administered 2023-07-08: 40 mg via INTRAVENOUS
  Filled 2023-07-07 (×2): qty 4

## 2023-07-07 MED ORDER — SPIRONOLACTONE 25 MG PO TABS
25.0000 mg | ORAL_TABLET | Freq: Every day | ORAL | Status: DC
  Administered 2023-07-08 – 2023-07-09 (×2): 25 mg via ORAL
  Filled 2023-07-07 (×2): qty 1

## 2023-07-07 MED ORDER — ONDANSETRON HCL 4 MG/2ML IJ SOLN: 4.0000 mg | Freq: Four times a day (QID) | INTRAMUSCULAR | Status: DC | PRN

## 2023-07-07 MED ORDER — ACETAMINOPHEN 325 MG PO TABS
650.0000 mg | ORAL_TABLET | Freq: Four times a day (QID) | ORAL | Status: DC | PRN
Start: 2023-07-07 — End: 2023-07-09
  Administered 2023-07-08 (×2): 650 mg via ORAL
  Filled 2023-07-07 (×2): qty 2

## 2023-07-07 MED ORDER — DILTIAZEM HCL ER COATED BEADS 180 MG PO CP24
180.0000 mg | ORAL_CAPSULE | Freq: Every day | ORAL | Status: DC
  Administered 2023-07-08 – 2023-07-09 (×2): 180 mg via ORAL
  Filled 2023-07-07 (×2): qty 1

## 2023-07-07 MED ORDER — ACETAMINOPHEN 650 MG RE SUPP: 650.0000 mg | Freq: Four times a day (QID) | RECTAL | Status: DC | PRN

## 2023-07-07 MED ORDER — ENOXAPARIN SODIUM 40 MG/0.4ML IJ SOSY
40.0000 mg | PREFILLED_SYRINGE | INTRAMUSCULAR | Status: DC
  Administered 2023-07-07 – 2023-07-08 (×2): 40 mg via SUBCUTANEOUS
  Filled 2023-07-07 (×2): qty 0.4

## 2023-07-07 MED ORDER — DULOXETINE HCL 60 MG PO CPEP
60.0000 mg | ORAL_CAPSULE | Freq: Every day | ORAL | Status: DC
  Administered 2023-07-08 – 2023-07-09 (×2): 60 mg via ORAL
  Filled 2023-07-07 (×2): qty 1

## 2023-07-07 MED ORDER — ASPIRIN 81 MG PO TBEC
81.0000 mg | DELAYED_RELEASE_TABLET | Freq: Every day | ORAL | Status: DC
  Administered 2023-07-08 – 2023-07-09 (×2): 81 mg via ORAL
  Filled 2023-07-07 (×2): qty 1

## 2023-07-07 MED ORDER — ONDANSETRON HCL 4 MG PO TABS: 4.0000 mg | ORAL_TABLET | Freq: Four times a day (QID) | ORAL | Status: DC | PRN

## 2023-07-07 NOTE — Consult Note (Signed)
CONSULTATION NOTE   Patient Name: Lydia Thompson Date of Encounter: 07/07/2023 Cardiologist: Elder Negus, MD Electrophysiologist: None Advanced Heart Failure: None   Chief Complaint   Shortness of breath, chest pressure and racing heart  Patient Profile   47 yo female with a history of hypertrophic cardiomyopathy, SVT, TIA and congestive heart failure who was scheduled to be seen today however called the office and noted she was having progressive shortness of breath, chest tightness and racing heart  HPI   Lydia Thompson is a 47 y.o. female who is being seen today for the evaluation of shortness of breath at the request of Dr. Kirke Corin. This is a 47 year old female patient followed by Dr. Rosemary Holms with a history of hypertrophic cardiomyopathy, SVT, TIA and heart failure with preserved ejection fraction.  She notes that her symptoms started about a week prior to her arrival.  She has had progressive shortness of breath, cough with frothy sputum, and she is also felt a racing heart.  She has denied any edema but noted about 2 to 4 pound weight gain recently.  She took additional Lasix yesterday and was down about 2 pounds today.  She was scheduled to be seen in the office but called and was directed to the emergency department for further evaluation.  Workup revealed mild elevated troponins at 45 and 41 which were flat, elevated BNP at 468, leukocytosis with a white blood cell count of 17,600.  Chest x-ray consistent with pulmonary edema.  EKG shows sinus rhythm with LVH by voltage, but no ischemic changes.  She was given 40 mg of IV Lasix and admitted to the hospital service with cardiology consultation.  Of note, her last echo was in 2023 which showed LVEF 61% and severe concentric LVH, but no evidence of previously noted LVOT gradient which was 13 mmHg previously.  Echoes have not shown systolic dysfunction in the past rather continue to show significant LV wall thickening with up  to moderate mitral regurgitation.  She did have a coronary CT angiogram back in February 2020 which showed 0 coronary calcium and no evidence of coronary artery disease however dilated pulmonary artery suggestive of pulmonary hypertension.  PMHx   Past Medical History:  Diagnosis Date   Anxiety    Chest pain of uncertain etiology 09/26/2017   CHF (congestive heart failure) (HCC)    Hot flashes, menopausal 08/30/2022   Tried OTC medications without help   Hypertension    Low libido 08/30/2022    Past Surgical History:  Procedure Laterality Date   ABDOMINAL HYSTERECTOMY  08/23/2003   dermoid cyst    FAMHx   Family History  Problem Relation Age of Onset   Cancer Mother 36       bone cancer   Hypertension Mother    Other Sister        Pipolar   Heart disease Paternal Uncle    Arthritis Paternal Uncle    Asthma Paternal Uncle    Hypertension Paternal Uncle    Cancer Maternal Grandmother        Lung cancer    SOCHx    reports that she has never smoked. She has never used smokeless tobacco. She reports current alcohol use. She reports that she does not use drugs.  Outpatient Medications   No current facility-administered medications on file prior to encounter.   Current Outpatient Medications on File Prior to Encounter  Medication Sig Dispense Refill   aspirin EC 81 MG tablet Take  1 tablet (81 mg total) by mouth daily. Swallow whole. 90 tablet 3   diltiazem (CARDIZEM CD) 180 MG 24 hr capsule Take 1 capsule (180 mg total) by mouth daily. 90 capsule 3   DULoxetine (CYMBALTA) 60 MG capsule Take 1 capsule (60 mg total) by mouth daily. Replaces 30 mg dose 90 capsule 3   estradiol (ESTRACE) 0.1 MG/GM vaginal cream Limit use to minimum amount to control symptoms. (Patient taking differently: Place 1 Applicatorful vaginally as needed (dryness).) 42.5 g 1   Fezolinetant (VEOZAH) 45 MG TABS Take 1 tablet (45 mg total) by mouth daily at 6 (six) AM. 30 tablet 11   furosemide (LASIX)  20 MG tablet Take 1 tablet (20 mg total) by mouth 2 (two) times daily as needed. 180 tablet 3   metoprolol succinate (TOPROL-XL) 50 MG 24 hr tablet TAKE 1 TABLET (50 MG TOTAL) BY MOUTH DAILY. TAKE WITH OR IMMEDIATELY FOLLOWING A MEAL. 90 tablet 3   ondansetron (ZOFRAN-ODT) 4 MG disintegrating tablet Take 1 tablet (4 mg total) by mouth every 8 (eight) hours as needed for nausea or vomiting (for nausea from wegovy or other source). (Patient taking differently: Take 4 mg by mouth as needed for nausea or vomiting.) 20 tablet 0   spironolactone (ALDACTONE) 25 MG tablet Take 1 tablet (25 mg total) by mouth daily. 90 tablet 3   conjugated estrogens (PREMARIN) vaginal cream Place 0.5 gm vaginally daily for 3 weeks, 1 week off, then repeat (Patient not taking: Reported on 07/07/2023) 30 g 3   Dulaglutide (TRULICITY) 0.75 MG/0.5ML SOAJ Inject 0.75 mg into the skin once a week. (Patient not taking: Reported on 04/14/2023) 2 mL 0   Dulaglutide (TRULICITY) 3 MG/0.5ML SOPN Inject 3 mg as directed once a week. (Patient not taking: Reported on 04/14/2023) 2 mL 0   Dulaglutide (TRULICITY) 4.5 MG/0.5ML SOPN Inject 4.5 mg as directed once a week. (Patient not taking: Reported on 04/14/2023) 2 mL 0   meclizine (ANTIVERT) 25 MG tablet Take 1 tablet (25 mg total) by mouth 3 (three) times daily as needed for dizziness. (Patient not taking: Reported on 07/07/2023) 30 tablet 1   metFORMIN (GLUCOPHAGE) 500 MG tablet Take 1 tablet (500 mg total) by mouth 2 (two) times daily with a meal. To start: just half a tablet daily for 2 weeks. (Patient not taking: Reported on 07/07/2023) 180 tablet 3   Semaglutide-Weight Management 1 MG/0.5ML SOAJ Inject 1 mg into the skin once a week for 28 days. (Patient not taking: Reported on 07/07/2023) 2 mL 0   [START ON 07/10/2023] Semaglutide-Weight Management 1.7 MG/0.75ML SOAJ Inject 1.7 mg into the skin once a week for 28 days. (Patient not taking: Reported on 07/07/2023) 3 mL 0   [START ON  08/08/2023] Semaglutide-Weight Management 2.4 MG/0.75ML SOAJ Inject 2.4 mg into the skin once a week for 28 days. (Patient not taking: Reported on 07/07/2023) 3 mL 0   tirzepatide (ZEPBOUND) 2.5 MG/0.5ML Pen Inject 2.5 mg into the skin once a week for 28 days, THEN 5 mg once a week for 28 days, THEN 7.5 mg once a week for 28 days, THEN 10 mg once a week for 28 days, THEN 12.5 mg once a week for 28 days, THEN 15 mg once a week for 28 days. Body mass index is 38.62 kg/m.Marland Kitchen Patient is not diabetic. If not covered, check for savings card online.. (Patient not taking: Reported on 07/07/2023) 0.5 mL 3   topiramate (TOPAMAX) 25 MG tablet Take 1 tablet (  25 mg total) by mouth 2 (two) times daily. To start: just 1 tablet daily for a week (Patient not taking: Reported on 07/07/2023) 180 tablet 3   [DISCONTINUED] diltiazem (DILACOR XR) 180 MG 24 hr capsule Take 1 capsule (180 mg total) by mouth daily. 90 capsule 3   [DISCONTINUED] isosorbide mononitrate (IMDUR) 30 MG 24 hr tablet Take 1 tablet (30 mg total) by mouth daily. 30 tablet 3    Inpatient Medications    Scheduled Meds:   Continuous Infusions:   PRN Meds:    ALLERGIES   No Known Allergies  ROS   Pertinent items noted in HPI and remainder of comprehensive ROS otherwise negative.  Vitals   Vitals:   07/07/23 1115 07/07/23 1130 07/07/23 1213 07/07/23 1334  BP: 120/72 121/76  102/73  Pulse: 76 76  73  Resp: 16 13  18   Temp:   98.6 F (37 C) 98.8 F (37.1 C)  TempSrc:   Oral Oral  SpO2: 97% 100%  99%  Weight:    102.5 kg  Height:    5\' 2"  (1.575 m)   No intake or output data in the 24 hours ending 07/07/23 1519 Filed Weights   07/07/23 1610 07/07/23 1334  Weight: 102.1 kg 102.5 kg    Physical Exam   General appearance: alert and no distress Neck: JVD - 4 cm above sternal notch, no carotid bruit, and thyroid not enlarged, symmetric, no tenderness/mass/nodules Lungs: clear to auscultation bilaterally Heart: regular rate and  rhythm, S1, S2 normal, no murmur, click, rub or gallop Abdomen: soft, non-tender; bowel sounds normal; no masses,  no organomegaly and obese Extremities: extremities normal, atraumatic, no cyanosis or edema Pulses: 2+ and symmetric Skin: Skin color, texture, turgor normal. No rashes or lesions Neurologic: Grossly normal Psych: Pleasant  Labs   Results for orders placed or performed during the hospital encounter of 07/07/23 (from the past 48 hour(s))  Basic metabolic panel     Status: Abnormal   Collection Time: 07/07/23  6:37 AM  Result Value Ref Range   Sodium 137 135 - 145 mmol/L   Potassium 3.7 3.5 - 5.1 mmol/L   Chloride 102 98 - 111 mmol/L   CO2 25 22 - 32 mmol/L   Glucose, Bld 125 (H) 70 - 99 mg/dL    Comment: Glucose reference range applies only to samples taken after fasting for at least 8 hours.   BUN 20 6 - 20 mg/dL   Creatinine, Ser 9.60 0.44 - 1.00 mg/dL   Calcium 8.9 8.9 - 45.4 mg/dL   GFR, Estimated >09 >81 mL/min    Comment: (NOTE) Calculated using the CKD-EPI Creatinine Equation (2021)    Anion gap 10 5 - 15    Comment: Performed at [facility], [address] [place], [place] [address]  CBC     Status: Abnormal   Collection Time: 07/07/23  6:37 AM  Result Value Ref Range   WBC 17.6 (H) 4.0 - 10.5 K/uL   RBC 4.89 3.87 - 5.11 MIL/uL   Hemoglobin 13.4 12.0 - 15.0 g/dL   HCT 82.9 56.2 - 13.0 %   MCV 83.8 80.0 - 100.0 fL   MCH 27.4 26.0 - 34.0 pg   MCHC 32.7 30.0 - 36.0 g/dL   RDW 86.5 78.4 - 69.6 %   Platelets 288 150 - 400 K/uL   nRBC 0.0 0.0 - 0.2 %    Comment: Performed at [facility], [address] [place], [place] [address]  Troponin I (High Sensitivity)     Status: Abnormal   Collection Time: 07/07/23  6:37 AM  Result Value Ref Range   Troponin I (High Sensitivity) 45 (H) <18 ng/L    Comment: (NOTE) Elevated high sensitivity troponin I (hsTnI) values and significant  changes across serial measurements may  suggest ACS but many other  chronic and acute conditions are known to elevate hsTnI results.  Refer to the "Links" section for chest pain algorithms and additional  guidance. Performed at [facility], [address], [place], [place] [address]   Brain natriuretic peptide     Status: Abnormal   Collection Time: 07/07/23  6:37 AM  Result Value Ref Range   B Natriuretic Peptide 468.0 (H) 0.0 - 100.0 pg/mL    Comment: Performed at [facility], [address] [place], [place] [address]  Troponin I (High Sensitivity)     Status: Abnormal   Collection Time: 07/07/23  8:52 AM  Result Value Ref Range   Troponin I (High Sensitivity) 41 (H) <18 ng/L    Comment: (NOTE) Elevated high sensitivity troponin I (hsTnI) values and significant  changes across serial measurements may suggest ACS but many other  chronic and acute conditions are known to elevate hsTnI results.  Refer to the "Links" section for chest pain algorithms and additional  guidance. Performed at [facility], [address] [place], [place] [address]     ECG   Sinus rhythm with LVH by voltage - Personally Reviewed  Telemetry   Sinus rhythm - Personally Reviewed  Radiology   DG Chest 2 View  Result Date: 07/07/2023 CLINICAL DATA:  Chest pain that started on Sunday.  History of CHF EXAM: CHEST - 2 VIEW COMPARISON:  02/19/2020 FINDINGS: Diffuse interstitial opacity with Kerley lines, fissure thickening, airway cuffing. Symmetric hazy airspace disease. Borderline heart size. Upper mediastinal contours are stable. IMPRESSION: Pulmonary edema. Electronically Signed   By: Tiburcio Pea M.D.   On: 07/07/2023 07:06    Cardiac Studies   N/A  Impression   Principal Problem:   Acute exacerbation of congestive heart failure (HCC)   Recommendation   Acute diastolic heart failure Progressive dyspnea on exertion, possible dietary indiscretions but compliant  with medications.  BNP elevated and chest x-ray consistent with pulmonary edema.  This is likely acute on chronic diastolic heart failure given her severe LVH.  She is already improved with IV Lasix.  Will give additional IV Lasix tonight and continue with diuresis, 40 mg IV twice daily, monitor output and renal function.  Will plan a repeat echo tomorrow since her last study was in 2023.  Would continue home heart failure medications including metoprolol and spironolactone.  Would consider addition of SGLT2 inhibitor for benefit as well. Chest pain Seems nonanginal in nature, worse with taking a deep breath and may be related to heart failure.  Troponin flat mildly elevated more consistent with demand ischemia.  She had no coronary disease by coronary CT in 2020. HOCM This is a diagnosis although may be considered global variant because she had no LVOT obstruction.  No family history of early onset heart failure or sudden cardiac death.  Overall etiology of her LV wall thickening is unclear.  She has not had previous genetic testing or cardiac MRI.  May consider these modalities when she is more compensated. Tachycardia She reports recent tachycardia with heart rates in the 110-120 range.  This is worse when exerting herself and I suspect this is a  sinus tachycardia although she has had a history of SVT in the past.  She will be monitored here to see if she is having any paroxysmal tachyarrhythmias.  Thanks for the consultation.  Cardiology will follow with you.  Time Spent Directly with Patient:  I have spent a total of 45 minutes with the patient reviewing hospital notes, telemetry, EKGs, labs and examining the patient as well as establishing an assessment and plan that was discussed personally with the patient.  > 50% of time was spent in direct patient care.  Length of Stay:  LOS: 0 days   Chrystie Nose, MD, [facility], FACP  [place]  [facility]  Medical Director of the Advanced Lipid  Disorders [facility]  Cardiovascular Risk Reduction [facility] Diplomate of the American Board of Clinical Lipidology Attending Cardiologist  Direct Dial: [phone]  Fax: [phone]  Website:  [email or web address] Nicely Jason Hauge 07/07/2023, 3:19 PM

## 2023-07-07 NOTE — ED Triage Notes (Signed)
Chest pain in the center of the chest that started on Sunday. Has been getting progressively worse. She also states that she is rattling when she breaths so she thinks that she has fluid on the lungs.

## 2023-07-07 NOTE — ED Notes (Signed)
Report given to Catrina RN at [facility] for room 3 East bed 26.

## 2023-07-07 NOTE — Telephone Encounter (Signed)
Dr. Anitra Lauth is requesting to speak to DOD.

## 2023-07-07 NOTE — ED Provider Notes (Addendum)
[place] EMERGENCY DEPARTMENT AT [facility] Provider Note   CSN: [id number] Arrival date & time: 07/07/23  9147     History  Chief Complaint  Patient presents with   Chest Pain    Lydia Thompson is a 47 y.o. female.  Pt is a 47y/o female with hx of hypertrophic cardiomyopathy, history of SVT, TIA who is on spironolactone, Lasix, Imdur, metoprolol and Cardizem daily with preserved EF of 61% in 10/23 who is presenting today with complaints of chest tightness, shortness of breath and intermittent racing heart.  Patient reports her symptoms started on Sunday 6 days prior to arrival.  She reports every day it is getting progressively worse.  She called her cardiologist and has an appointment for later today but when she woke up this morning her symptoms were severe.  Patient reports that she has had a cough that today was productive of frothy pink sputum.  She is also felt a rattle in her chest and has shortness of breath at rest but worse with exertion.  Pain in her chest is not made worse with deep breathing.  She has not noticed any lower extremity swelling but checks her weight daily and reports she has been up 2 to 4 pounds.  She did take an additional Lasix yesterday and she was down 2 pounds this morning.  She is compliant with her medications.  She denies any abdominal pain, nausea, vomiting, fever.  She has no history of lung disease and does not use inhalers.  She has been placed on medications for hot flashes which are nonestrogen related but does use estradiol cream.  She has no prior history of blood clots.  She is not on antianticoagulation other than an aspirin daily.   Chest Pain      Home Medications Prior to Admission medications   Medication Sig Start Date End Date Taking? Authorizing Provider  aspirin EC 81 MG tablet Take 1 tablet (81 mg total) by mouth daily. Swallow whole. 09/01/22   Patwardhan, Anabel Bene, MD  conjugated estrogens (PREMARIN) vaginal cream Place  0.5 gm vaginally daily for 3 weeks, 1 week off, then repeat 06/22/22     diltiazem (CARDIZEM CD) 180 MG 24 hr capsule Take 1 capsule (180 mg total) by mouth daily. 09/01/22   Patwardhan, Manish J, MD  Dulaglutide (TRULICITY) 0.75 MG/0.5ML SOAJ Inject 0.75 mg into the skin once a week. Patient not taking: Reported on 04/14/2023 02/18/23   Lula Olszewski, MD  Dulaglutide (TRULICITY) 3 MG/0.5ML SOPN Inject 3 mg as directed once a week. Patient not taking: Reported on 04/14/2023 02/27/23   Lula Olszewski, MD  Dulaglutide (TRULICITY) 4.5 MG/0.5ML SOPN Inject 4.5 mg as directed once a week. Patient not taking: Reported on 04/14/2023 03/27/23   Lula Olszewski, MD  DULoxetine (CYMBALTA) 60 MG capsule Take 1 capsule (60 mg total) by mouth daily. Replaces 30 mg dose 10/06/22   Lula Olszewski, MD  estradiol (ESTRACE) 0.1 MG/GM vaginal cream Limit use to minimum amount to control symptoms. 08/30/22   Lula Olszewski, MD  Fezolinetant [facility]) 45 MG TABS Take 1 tablet (45 mg total) by mouth daily at 6 (six) AM. 04/14/23   Lula Olszewski, MD  furosemide (LASIX) 20 MG tablet Take 1 tablet (20 mg total) by mouth 2 (two) times daily as needed. 07/27/22 07/27/23  Patwardhan, Anabel Bene, MD  meclizine (ANTIVERT) 25 MG tablet Take 1 tablet (25 mg total) by mouth 3 (three) times daily as  needed for dizziness. 08/30/22   Lula Olszewski, MD  metFORMIN (GLUCOPHAGE) 500 MG tablet Take 1 tablet (500 mg total) by mouth 2 (two) times daily with a meal. To start: just half a tablet daily for 2 weeks. 04/14/23   Lula Olszewski, MD  metoprolol succinate (TOPROL-XL) 50 MG 24 hr tablet TAKE 1 TABLET (50 MG TOTAL) BY MOUTH DAILY. TAKE WITH OR IMMEDIATELY FOLLOWING A MEAL. 09/01/22   Patwardhan, Manish J, MD  ondansetron (ZOFRAN-ODT) 4 MG disintegrating tablet Take 1 tablet (4 mg total) by mouth every 8 (eight) hours as needed for nausea or vomiting (for nausea from wegovy or other source). 08/30/22   Lula Olszewski, MD  Semaglutide-Weight  Management 1 MG/0.5ML SOAJ Inject 1 mg into the skin once a week for 28 days. 06/11/23 07/09/23  Lula Olszewski, MD  Semaglutide-Weight Management 1.7 MG/0.75ML SOAJ Inject 1.7 mg into the skin once a week for 28 days. 07/10/23 08/07/23  Lula Olszewski, MD  Semaglutide-Weight Management 2.4 MG/0.75ML SOAJ Inject 2.4 mg into the skin once a week for 28 days. 08/08/23 09/05/23  Lula Olszewski, MD  spironolactone (ALDACTONE) 25 MG tablet Take 1 tablet (25 mg total) by mouth daily. 09/01/22   Patwardhan, Anabel Bene, MD  tirzepatide (ZEPBOUND) 2.5 MG/0.5ML Pen Inject 2.5 mg into the skin once a week for 28 days, THEN 5 mg once a week for 28 days, THEN 7.5 mg once a week for 28 days, THEN 10 mg once a week for 28 days, THEN 12.5 mg once a week for 28 days, THEN 15 mg once a week for 28 days. Body mass index is 38.62 kg/m.Marland Kitchen Patient is not diabetic. If not covered, check for savings card online.. 04/14/23 09/29/23  Lula Olszewski, MD  topiramate (TOPAMAX) 25 MG tablet Take 1 tablet (25 mg total) by mouth 2 (two) times daily. To start: just 1 tablet daily for a week 04/14/23   Lula Olszewski, MD  diltiazem (DILACOR XR) 180 MG 24 hr capsule Take 1 capsule (180 mg total) by mouth daily. 06/02/22 09/01/22  Patwardhan, Anabel Bene, MD  isosorbide mononitrate (IMDUR) 30 MG 24 hr tablet Take 1 tablet (30 mg total) by mouth daily. 09/18/20 10/23/20  Elder Negus, MD      Allergies    Patient has no known allergies.    Review of Systems   Review of Systems  Cardiovascular:  Positive for chest pain.    Physical Exam Updated Vital Signs BP 110/75   Pulse 77   Temp 98.7 F (37.1 C) (Oral)   Resp 18   Ht 5\' 4"  (1.626 m)   Wt 102.1 kg   SpO2 93%   BMI 38.62 kg/m  Physical Exam Vitals and nursing note reviewed.  Constitutional:      General: She is not in acute distress.    Appearance: She is well-developed.  HENT:     Head: Normocephalic and atraumatic.     Mouth/Throat:     Mouth: Mucous  membranes are moist.  Eyes:     Pupils: Pupils are equal, round, and reactive to light.  Cardiovascular:     Rate and Rhythm: Normal rate and regular rhythm.     Heart sounds: Murmur heard.     Systolic murmur is present with a grade of 3/6.     No friction rub.  Pulmonary:     Effort: Pulmonary effort is normal. Tachypnea present.     Breath sounds: Rales present.  No wheezing.  Abdominal:     General: Bowel sounds are normal. There is no distension.     Palpations: Abdomen is soft.     Tenderness: There is no abdominal tenderness. There is no guarding or rebound.  Musculoskeletal:        General: No tenderness. Normal range of motion.     Right lower leg: No edema.     Left lower leg: No edema.     Comments: No edema  Skin:    General: Skin is warm and dry.     Findings: No rash.  Neurological:     Mental Status: She is alert and oriented to person, place, and time. Mental status is at baseline.     Cranial Nerves: No cranial nerve deficit.  Psychiatric:        Behavior: Behavior normal.     ED Results / Procedures / Treatments   Labs (all labs ordered are listed, but only abnormal results are displayed) Labs Reviewed  BASIC METABOLIC PANEL - Abnormal; Notable for the following components:      Result Value   Glucose, Bld 125 (*)    All other components within normal limits  CBC - Abnormal; Notable for the following components:   WBC 17.6 (*)    All other components within normal limits  BRAIN NATRIURETIC PEPTIDE - Abnormal; Notable for the following components:   B Natriuretic Peptide 468.0 (*)    All other components within normal limits  TROPONIN I (HIGH SENSITIVITY) - Abnormal; Notable for the following components:   Troponin I (High Sensitivity) 45 (*)    All other components within normal limits  TROPONIN I (HIGH SENSITIVITY)    EKG EKG Interpretation Date/Time:  Friday July 07 2023 06:34:51 EST Ventricular Rate:  88 PR Interval:  168 QRS  Duration:  95 QT Interval:  368 QTC Calculation: 446 R Axis:   65  Text Interpretation: Sinus rhythm Right atrial enlargement Probable LVH with secondary repol abnrm No significant change since 02/19/2020 Confirmed by Geoffery Lyons ([id number]) on 07/07/2023 6:38:20 AM  Radiology DG Chest 2 View  Result Date: 07/07/2023 CLINICAL DATA:  Chest pain that started on Sunday.  History of CHF EXAM: CHEST - 2 VIEW COMPARISON:  02/19/2020 FINDINGS: Diffuse interstitial opacity with Kerley lines, fissure thickening, airway cuffing. Symmetric hazy airspace disease. Borderline heart size. Upper mediastinal contours are stable. IMPRESSION: Pulmonary edema. Electronically Signed   By: Tiburcio Pea M.D.   On: 07/07/2023 07:06    Procedures Procedures    Medications Ordered in ED Medications  furosemide (LASIX) injection 40 mg (has no administration in time range)    ED Course/ Medical Decision Making/ A&P                                 Medical Decision Making Amount and/or Complexity of Data Reviewed External Data Reviewed: notes. Labs: ordered. Decision-making details documented in ED Course. Radiology: ordered and independent interpretation performed. Decision-making details documented in ED Course. ECG/medicine tests: ordered and independent interpretation performed. Decision-making details documented in ED Course.  Risk Prescription drug management. Decision regarding hospitalization.   Pt with multiple medical problems and comorbidities and presenting today with a complaint that caries a high risk for morbidity and mortality.  Here today with symptoms concerning for CHF, ACS, lower suspicion for PE and pneumonia/viral process.  Patient is not displaying symptoms concerning for GI pathology.  I independently interpreted  patient's EKG and labs.  Patient's EKG without acute findings today, BMP within normal limits, CBC with leukocytosis of 17.6 today with normal hemoglobin, troponin elevated  at 45 which is elevated above patient's baseline from 2021, BNP 468. I have independently visualized and interpreted pt's images today.  Chest x-ray today with significant pulmonary edema.  Upon arrival patient was hypoxic at 87% on room air.  She is requiring 2 L of oxygen to maintain O2 sats of greater than 90%. Patient given IV Lasix for diuresis. Pt will need admission for diuresis.  Consulted cardiology.  Dr. Elease Hashimoto is covering for [facility] while he is on vacation and recommends hospitalist admission and cards with consult.  Consulted hospitalist for admission.  CRITICAL CARE Performed by: Indya Oliveria Total critical care time: 30 minutes Critical care time was exclusive of separately billable procedures and treating other patients. Critical care was necessary to treat or prevent imminent or life-threatening deterioration. Critical care was time spent personally by me on the following activities: development of treatment plan with patient and/or surrogate as well as nursing, discussions with consultants, evaluation of patient's response to treatment, examination of patient, obtaining history from patient or surrogate, ordering and performing treatments and interventions, ordering and review of laboratory studies, ordering and review of radiographic studies, pulse oximetry and re-evaluation of patient's condition.         Final Clinical Impression(s) / ED Diagnoses Final diagnoses:  Acute on chronic congestive heart failure, unspecified heart failure type (HCC)  Acute respiratory failure with hypoxia [facility])    Rx / DC Orders ED Discharge Orders     None         Gwyneth Sprout, MD 07/07/23 [id number]    Gwyneth Sprout, MD 07/07/23 [phone]

## 2023-07-07 NOTE — ED Notes (Signed)
Called [facility] to transport the patient to [facility] Rm#[id number]

## 2023-07-07 NOTE — ED Notes (Signed)
Rt Note: Patient on 2lpm [place] to maintain oxygen saturation at 94%. Tolerating well at this time

## 2023-07-07 NOTE — Plan of Care (Signed)
Patient ID: Lydia Thompson, female   DOB: July 29, 1976, 47 y.o.   MRN: [id number]  Problem: Education: Goal: Knowledge of General Education information will improve Description: Including pain rating scale, medication(s)/side effects and non-pharmacologic comfort measures Outcome: Progressing   Problem: Health Behavior/Discharge Planning: Goal: Ability to manage health-related needs will improve Outcome: Progressing   Problem: Clinical Measurements: Goal: Ability to maintain clinical measurements within normal limits will improve Outcome: Progressing Goal: Will remain free from infection Outcome: Progressing Goal: Diagnostic test results will improve Outcome: Progressing Goal: Respiratory complications will improve Outcome: Progressing Goal: Cardiovascular complication will be avoided Outcome: Progressing   Problem: Activity: Goal: Risk for activity intolerance will decrease Outcome: Progressing   Problem: Nutrition: Goal: Adequate nutrition will be maintained Outcome: Progressing   Problem: Coping: Goal: Level of anxiety will decrease Outcome: Progressing   Problem: Elimination: Goal: Will not experience complications related to bowel motility Outcome: Progressing Goal: Will not experience complications related to urinary retention Outcome: Progressing   Problem: Pain Management: Goal: General experience of comfort will improve Outcome: Progressing   Problem: Safety: Goal: Ability to remain free from injury will improve Outcome: Progressing   Problem: Skin Integrity: Goal: Risk for impaired skin integrity will decrease Outcome: Progressing   Problem: Education: Goal: Ability to demonstrate management of disease process will improve Outcome: Progressing Goal: Ability to verbalize understanding of medication therapies will improve Outcome: Progressing Goal: Individualized Educational Video(s) Outcome: Progressing   Problem: Activity: Goal: Capacity to carry  out activities will improve Outcome: Progressing   Problem: Cardiac: Goal: Ability to achieve and maintain adequate cardiopulmonary perfusion will improve Outcome: Progressing    [name], RN

## 2023-07-07 NOTE — ED Notes (Signed)
Patient returned from xray. Xray asking about O2 amount. None documented. Placed on 2 LPM via [place] due to low SPO2.

## 2023-07-07 NOTE — H&P (Signed)
History and Physical    Patient: Lydia Thompson ZOX:[id number] DOB: September 14, 1975 DOA: 07/07/2023 DOS: the patient was seen and examined on 07/07/2023 PCP: Lula Olszewski, MD  Patient coming from: [facility]  Chief Complaint:  Chief Complaint  Patient presents with   Chest Pain   HPI: Lydia Thompson is a 47 y.o. female with medical history significant of hypertrophic cardiomyopathy, SVTs, TIA and HFpEF who presented to the [facility] ED for evaluation of shortness of breath, chest pressure and elevated heart rate.  Patient reports that since Sunday, she has had worsening shortness of breath and dyspnea on exertion.  She has had shortness of breath when she lays down and this improves when she sits up. She reports associated cough with frothy sputum as well as some chest pressure and elevated heart rate to the 120s. She reports about a 4 pounds weight gain which caused her to take additional dose of her Lasix yesterday.  Patient has been advised to take extra Lasix if her weight goes up to 230 or more. She reports taking her Lasix as prescribed but endorses some dietary indiscretion. States her husband was recently diagnosed with cancer and he is often the 1 that cooks for her. He usually cooks with seasoning without additional salt however patient states she loves salty foods such as potato chips and fries. She admits to not sticking to a low-salt diet over the last week.  She denies any fevers, chills, headaches, leg swelling or abdominal pain.  [facility] ED course: Mildly tachypneic and hypoxic on arrival requiring 2 L [place] otherwise normal vitals. Labs showed creatinine 0.9, potassium 3.7, WBC 17.6, Hgb 13.4, troponin 45->41, BNP 468 CXR shows evidence of pulmonary edema Received IV Lasix 40 mg x 1 Cardiology was consulted for evaluation Admitted to [facility] service and transferred to [facility]  Review of Systems: As mentioned in the history of present illness. All other systems reviewed and are  negative. Past Medical History:  Diagnosis Date   Anxiety    Chest pain of uncertain etiology 09/26/2017   CHF (congestive heart failure) (HCC)    Hot flashes, menopausal 08/30/2022   Tried OTC medications without help   Hypertension    Low libido 08/30/2022   Past Surgical History:  Procedure Laterality Date   ABDOMINAL HYSTERECTOMY  08/23/2003   dermoid cyst   Social History:  reports that she has never smoked. She has never used smokeless tobacco. She reports current alcohol use. She reports that she does not use drugs.  No Known Allergies  Family History  Problem Relation Age of Onset   Cancer Mother 71       bone cancer   Hypertension Mother    Other Sister        Pipolar   Heart disease Paternal Uncle    Arthritis Paternal Uncle    Asthma Paternal Uncle    Hypertension Paternal Uncle    Cancer Maternal Grandmother        Lung cancer    Prior to Admission medications   Medication Sig Start Date End Date Taking? Authorizing Provider  aspirin EC 81 MG tablet Take 1 tablet (81 mg total) by mouth daily. Swallow whole. 09/01/22  Yes Patwardhan, Anabel Bene, MD  diltiazem (CARDIZEM CD) 180 MG 24 hr capsule Take 1 capsule (180 mg total) by mouth daily. 09/01/22  Yes Patwardhan, Manish J, MD  DULoxetine (CYMBALTA) 60 MG capsule Take 1 capsule (60 mg total) by mouth daily. Replaces 30 mg dose 10/06/22  Yes Lula Olszewski, MD  estradiol (ESTRACE) 0.1 MG/GM vaginal cream Limit use to minimum amount to control symptoms. Patient taking differently: Place 1 Applicatorful vaginally as needed (dryness). 08/30/22  Yes Lula Olszewski, MD  Fezolinetant [facility]) 45 MG TABS Take 1 tablet (45 mg total) by mouth daily at 6 (six) AM. 04/14/23  Yes Lula Olszewski, MD  furosemide (LASIX) 20 MG tablet Take 1 tablet (20 mg total) by mouth 2 (two) times daily as needed. 07/27/22 07/27/23 Yes Patwardhan, Manish J, MD  metoprolol succinate (TOPROL-XL) 50 MG 24 hr tablet TAKE 1 TABLET (50 MG TOTAL) BY  MOUTH DAILY. TAKE WITH OR IMMEDIATELY FOLLOWING A MEAL. 09/01/22  Yes Patwardhan, Manish J, MD  ondansetron (ZOFRAN-ODT) 4 MG disintegrating tablet Take 1 tablet (4 mg total) by mouth every 8 (eight) hours as needed for nausea or vomiting (for nausea from wegovy or other source). Patient taking differently: Take 4 mg by mouth as needed for nausea or vomiting. 08/30/22  Yes Lula Olszewski, MD  spironolactone (ALDACTONE) 25 MG tablet Take 1 tablet (25 mg total) by mouth daily. 09/01/22  Yes Patwardhan, Anabel Bene, MD  conjugated estrogens (PREMARIN) vaginal cream Place 0.5 gm vaginally daily for 3 weeks, 1 week off, then repeat Patient not taking: Reported on 07/07/2023 06/22/22     Dulaglutide (TRULICITY) 0.75 MG/0.5ML SOAJ Inject 0.75 mg into the skin once a week. Patient not taking: Reported on 04/14/2023 02/18/23   Lula Olszewski, MD  Dulaglutide (TRULICITY) 3 MG/0.5ML SOPN Inject 3 mg as directed once a week. Patient not taking: Reported on 04/14/2023 02/27/23   Lula Olszewski, MD  Dulaglutide (TRULICITY) 4.5 MG/0.5ML SOPN Inject 4.5 mg as directed once a week. Patient not taking: Reported on 04/14/2023 03/27/23   Lula Olszewski, MD  meclizine (ANTIVERT) 25 MG tablet Take 1 tablet (25 mg total) by mouth 3 (three) times daily as needed for dizziness. Patient not taking: Reported on 07/07/2023 08/30/22   Lula Olszewski, MD  metFORMIN (GLUCOPHAGE) 500 MG tablet Take 1 tablet (500 mg total) by mouth 2 (two) times daily with a meal. To start: just half a tablet daily for 2 weeks. Patient not taking: Reported on 07/07/2023 04/14/23   Lula Olszewski, MD  Semaglutide-Weight Management 1 MG/0.5ML SOAJ Inject 1 mg into the skin once a week for 28 days. Patient not taking: Reported on 07/07/2023 06/11/23 07/09/23  Lula Olszewski, MD  Semaglutide-Weight Management 1.7 MG/0.75ML SOAJ Inject 1.7 mg into the skin once a week for 28 days. Patient not taking: Reported on 07/07/2023 07/10/23 08/07/23  Lula Olszewski, MD  Semaglutide-Weight Management 2.4 MG/0.75ML SOAJ Inject 2.4 mg into the skin once a week for 28 days. Patient not taking: Reported on 07/07/2023 08/08/23 09/05/23  Lula Olszewski, MD  tirzepatide [facility]) 2.5 MG/0.5ML Pen Inject 2.5 mg into the skin once a week for 28 days, THEN 5 mg once a week for 28 days, THEN 7.5 mg once a week for 28 days, THEN 10 mg once a week for 28 days, THEN 12.5 mg once a week for 28 days, THEN 15 mg once a week for 28 days. Body mass index is 38.62 kg/m.Marland Kitchen Patient is not diabetic. If not covered, check for savings card online.. Patient not taking: Reported on 07/07/2023 04/14/23 09/29/23  Lula Olszewski, MD  topiramate (TOPAMAX) 25 MG tablet Take 1 tablet (25 mg total) by mouth 2 (two) times daily. To start: just 1 tablet daily  for a week Patient not taking: Reported on 07/07/2023 04/14/23   Lula Olszewski, MD  diltiazem (DILACOR XR) 180 MG 24 hr capsule Take 1 capsule (180 mg total) by mouth daily. 06/02/22 09/01/22  Patwardhan, Anabel Bene, MD  isosorbide mononitrate (IMDUR) 30 MG 24 hr tablet Take 1 tablet (30 mg total) by mouth daily. 09/18/20 10/23/20  Elder Negus, MD    Physical Exam: Vitals:   07/07/23 1115 07/07/23 1130 07/07/23 1213 07/07/23 1334  BP: 120/72 121/76  102/73  Pulse: 76 76  73  Resp: 16 13  18   Temp:   98.6 F (37 C) 98.8 F (37.1 C)  TempSrc:   Oral Oral  SpO2: 97% 100%  99%  Weight:    102.5 kg  Height:    5\' 2"  (1.575 m)   General: Pleasant, well-appearing obese middle-aged woman laying in bed. No acute distress. HEENT: [place]/AT. Anicteric sclera Neck: Supple. JVD difficult to assess due to body habitus CV: RRR. II/VI systolic murmur. No LE edema Pulmonary: Lungs CTAB. Normal effort. No wheezing or rales.  Decreased air movement at the bases. Abdominal: Soft, nontender, nondistended. Normal bowel sounds. Extremities: Palpable radial and DP pulses. Normal ROM. Skin: Warm and dry. No obvious rash or lesions. Neuro: A&Ox3.  Moves all extremities. Normal sensation to light touch. No focal deficit. Psych: Normal mood and affect  Data Reviewed:  CBC showed isolated leukocytosis BMP with normal kidney function Checks x-ray shows pulmonary edema EKG shows sinus rhythm with right atrial enlargement and LVH pattern  Assessment and Plan:  GLENDORIS Lydia Thompson is a 47 y.o. female with medical history significant of hypertrophic cardiomyopathy, SVTs, TIA and HFpEF who presented to the [facility] for evaluation of shortness of breath, chest pressure and elevated heart rate and admitted for heart failure exacerbation.  # Acute on chronic HFpEF Patient with history of cardiotropic cardiomyopathy presented with progressive dyspnea on exertion with associated chest pressure and tachycardia found to have elevated BNP and chest x-ray consistent with pulmonary edema.  Patient not significantly fluid overloaded but currently requiring O2 supplementation due to shortness of breath. S/p IV Lasix 40 mg in the ED with improvement in symptoms.  Heart failure exacerbation likely secondary to dietary indiscretion. -Cardiology consulted, appreciate recs -Repeat echocardiogram -IV Lasix 40 mg twice daily -Resume Toprol XL 50 mg daily and spironolactone 25 mg daily -Strict I&O's, daily weight -Trend and replete electrolytes -Continue supplemental O2 -Registered dietitian consult to help with nutrition education  # Hypertrophic cardiomyopathy Last TTE on 05/2022 showed EF 61%, severe concentric remodeling of the left ventricle, moderately dilated LA, moderate mitral regular and the previously noted LVOT gradient of 13 mmHg that was not present. Patient reports this was diagnosed more than 5 years ago. She follows with Dr. Jacinto Halim who has been managing her heart problems. She denies any family history of sudden cardiac death or heart failure at a young age. She has not had any cardiac MRI or genetic testing. -Cardiology following, appreciate  recs -Toprol XL and spironolactone as above  # Hx SVT Patient reports that her Apple Watch showed heart rate in the 120s to 130s when she was having dyspnea on exertion.  Her heart rate has been stable in the 70s to 80s since admission. -Resume Toprol XL 50 mg daily and diltiazem 180 mg daily -Telemetry -Trend and replete electrolytes  # Leukocytosis CBC show WBC of 17.6 on admission. Patient afebrile without any other evidence of infection on exam or on  imaging. She has no dysuria or rash. Unclear the cause of her leukocytosis at this point, possibly reactive. Will monitor closely -Trend CBC, fever curve  # Hx of TIA No residual deficits. -Aspirin 81 mg daily  # Anxiety -Duloxetine 60 mg daily  # Class III obesity Body mass index is 41.33 kg/m. States her PCP has been attempted to help her lose weight via GLP-1 however she has not been able to afford these medications consistently. She has no history of diabetes. -Follow-up with PCP in the outpatient for weight management   Advance Care Planning:   Code Status: Full Code   Consults: Cardiology  Family Communication: No family at bedside  Severity of Illness: The appropriate patient status for this patient is INPATIENT. Inpatient status is judged to be reasonable and necessary in order to provide the required intensity of service to ensure the patient's safety. The patient's presenting symptoms, physical exam findings, and initial radiographic and laboratory data in the context of their chronic comorbidities is felt to place them at high risk for further clinical deterioration. Furthermore, it is not anticipated that the patient will be medically stable for discharge from the hospital within 2 midnights of admission.   * I certify that at the point of admission it is my clinical judgment that the patient will require inpatient hospital care spanning beyond 2 midnights from the point of admission due to high intensity of service,  high risk for further deterioration and high frequency of surveillance required.*  Author: Steffanie Rainwater, MD 07/07/2023 5:14 PM  For on call review [email or web address].

## 2023-07-07 NOTE — ED Notes (Signed)
Reports given to [facility].

## 2023-07-08 ENCOUNTER — Inpatient Hospital Stay (HOSPITAL_COMMUNITY): Payer: 59

## 2023-07-08 DIAGNOSIS — R0609 Other forms of dyspnea: Secondary | ICD-10-CM | POA: Diagnosis not present

## 2023-07-08 DIAGNOSIS — I471 Supraventricular tachycardia, unspecified: Secondary | ICD-10-CM

## 2023-07-08 DIAGNOSIS — I10 Essential (primary) hypertension: Secondary | ICD-10-CM

## 2023-07-08 DIAGNOSIS — Z8673 Personal history of transient ischemic attack (TIA), and cerebral infarction without residual deficits: Secondary | ICD-10-CM

## 2023-07-08 DIAGNOSIS — I5033 Acute on chronic diastolic (congestive) heart failure: Secondary | ICD-10-CM

## 2023-07-08 DIAGNOSIS — E876 Hypokalemia: Secondary | ICD-10-CM | POA: Diagnosis not present

## 2023-07-08 LAB — COMPREHENSIVE METABOLIC PANEL
ALT: 16 U/L (ref 0–44)
AST: 14 U/L — ABNORMAL LOW (ref 15–41)
Albumin: 3.6 g/dL (ref 3.5–5.0)
Alkaline Phosphatase: 65 U/L (ref 38–126)
Anion gap: 10 (ref 5–15)
BUN: 20 mg/dL (ref 6–20)
CO2: 28 mmol/L (ref 22–32)
Calcium: 9.3 mg/dL (ref 8.9–10.3)
Chloride: 97 mmol/L — ABNORMAL LOW (ref 98–111)
Creatinine, Ser: 0.84 mg/dL (ref 0.44–1.00)
GFR, Estimated: >60 mL/min (ref >60)
Glucose, Bld: 109 mg/dL — ABNORMAL HIGH (ref 70–99)
Potassium: 3.3 mmol/L — ABNORMAL LOW (ref 3.5–5.1)
Sodium: 135 mmol/L (ref 135–145)
Total Bilirubin: 1.2 mg/dL — ABNORMAL HIGH (ref <1.2)
Total Protein: 7.7 g/dL (ref 6.5–8.1)

## 2023-07-08 LAB — ECHOCARDIOGRAM COMPLETE
Area-P 1/2: 3.13 cm2
Height: 62 in
MV M vel: 4.99 m/s
MV Peak grad: 99.6 mm[Hg]
Radius: 0.6 cm
S' Lateral: 2.4 cm
Weight: 3566.16 [oz_av]

## 2023-07-08 LAB — CBC WITH DIFFERENTIAL/PLATELET
Abs Immature Granulocytes: 0.06 10*3/uL (ref 0.00–0.07)
Basophils Absolute: 0 10*3/uL (ref 0.0–0.1)
Basophils Relative: 0 %
Eosinophils Absolute: 0.4 10*3/uL (ref 0.0–0.5)
Eosinophils Relative: 3 %
HCT: 39.1 % (ref 36.0–46.0)
Hemoglobin: 12.9 g/dL (ref 12.0–15.0)
Immature Granulocytes: 0 %
Lymphocytes Relative: 25 %
Lymphs Abs: 3.4 10*3/uL (ref 0.7–4.0)
MCH: 27.1 pg (ref 26.0–34.0)
MCHC: 33 g/dL (ref 30.0–36.0)
MCV: 82.1 fL (ref 80.0–100.0)
Monocytes Absolute: 1.1 10*3/uL — ABNORMAL HIGH (ref 0.1–1.0)
Monocytes Relative: 8 %
Neutro Abs: 8.6 10*3/uL — ABNORMAL HIGH (ref 1.7–7.7)
Neutrophils Relative %: 64 %
Platelets: 283 10*3/uL (ref 150–400)
RBC: 4.76 MIL/uL (ref 3.87–5.11)
RDW: 13.4 % (ref 11.5–15.5)
WBC: 13.7 10*3/uL — ABNORMAL HIGH (ref 4.0–10.5)
nRBC: 0 % (ref 0.0–0.2)

## 2023-07-08 LAB — PHOSPHORUS: Phosphorus: 4.2 mg/dL (ref 2.5–4.6)

## 2023-07-08 LAB — MAGNESIUM: Magnesium: 2.2 mg/dL (ref 1.7–2.4)

## 2023-07-08 MED ORDER — FEZOLINETANT 45 MG PO TABS: 1.0000 | ORAL_TABLET | Freq: Every day | ORAL | Status: DC

## 2023-07-08 MED ORDER — POTASSIUM CHLORIDE CRYS ER 20 MEQ PO TBCR
40.0000 meq | EXTENDED_RELEASE_TABLET | ORAL | Status: AC
  Administered 2023-07-08 (×2): 40 meq via ORAL
  Filled 2023-07-08 (×2): qty 2

## 2023-07-08 MED ORDER — FUROSEMIDE 10 MG/ML IJ SOLN: 40.0000 mg | Freq: Two times a day (BID) | INTRAMUSCULAR | Status: DC

## 2023-07-08 MED ORDER — PERFLUTREN LIPID MICROSPHERE
1.0000 mL | INTRAVENOUS | Status: AC | PRN
  Administered 2023-07-08: 4 mL via INTRAVENOUS

## 2023-07-08 NOTE — Assessment & Plan Note (Addendum)
Echocardiogram with preserved LV systolic function with EF 70 to 75% LVOT gradient 41 mmHg at rest and 51 mmHg with valsalva, severe asymmetric left ventricular hypertrophy of the septal segment. Hypertrophied papillary muscles. RV systolic function is preserved, LA with severe dilatation, SAM of the mitral valve, moderate to severe mitral valve regurgitation.   Patient was placed on IV furosemide for diuresis, negative fluid balance was achieved, -3,524 ml, with significant improvement in her symptoms.    Plan to continue AV blockade with diltiazem and metoprolol, dose of metoprolol will be increased to 75 mg daily. Will need outpatient follow up with cardiac MRI.  Continue diuresis with furosemide 20 mg po bid. Ovoid over diuresis due to obstructive physiology.   Troponin elevation consistent with demand ischemic, ruled out for acute coronary syndrome.

## 2023-07-08 NOTE — Progress Notes (Signed)
Nutrition Brief Note  RD consulted for Low Sodium/Heart Failure diet education.   Per review of H&P, pt states her husband was recently diagnosed with cancer and he is often the 1 that cooks for her. He usually cooks with seasoning without additional salt however patient states she loves salty foods such as potato chips and fries. She admits to not sticking to a low-salt diet over the last week.   RD working remotely. Unfortunately, unsuccessful attempt to reach pt via phone call to room. Nutrition education handouts added to AVS and referral placed for outpatient nutrition education follow up.   Wt Readings from Last 15 Encounters:  07/08/23 101.1 kg  04/14/23 102.1 kg  12/06/22 102.5 kg  10/06/22 103.6 kg  09/19/22 104 kg  09/01/22 106.1 kg  08/30/22 105.2 kg  06/02/22 107 kg  08/06/21 105.1 kg  05/28/21 107 kg  05/14/21 105.2 kg  04/27/21 106.6 kg  04/09/21 106.1 kg  01/08/21 105.7 kg  10/23/20 105 kg    Body mass index is 40.77 kg/m. Patient meets criteria for obesity based on current BMI.   Current diet order is heart healthy, patient is consuming approximately 100% of meals at this time. Labs and medications reviewed.   No nutrition interventions warranted at this time. If nutrition issues arise, please consult RD.   Drusilla Kanner, RDN, LDN Clinical Nutrition

## 2023-07-08 NOTE — Plan of Care (Signed)
  Problem: Nutrition: Goal: Adequate nutrition will be maintained Outcome: Completed/Met   Problem: Elimination: Goal: Will not experience complications related to bowel motility Outcome: Completed/Met Goal: Will not experience complications related to urinary retention Outcome: Completed/Met   Problem: Pain Management: Goal: General experience of comfort will improve Outcome: Completed/Met   Problem: Skin Integrity: Goal: Risk for impaired skin integrity will decrease Outcome: Completed/Met

## 2023-07-08 NOTE — Discharge Instructions (Signed)

## 2023-07-08 NOTE — Assessment & Plan Note (Signed)
Continue blood pressure control with metoprolol succinate.

## 2023-07-08 NOTE — Assessment & Plan Note (Signed)
Renal function with serum cr at 0,89 with K at 4,2 and serum bicarbonate at 27.  Na 135 and Mg at 2.7   Plan to follow up renal function as outpatient.  Patient may need K supplementation depending on outpatient K levels.

## 2023-07-08 NOTE — Progress Notes (Signed)
Echocardiogram 2D Echocardiogram has been performed.  Warren Lacy Kaelan Emami RDCS 07/08/2023, 3:51 PM

## 2023-07-08 NOTE — Progress Notes (Addendum)
   Patient Name: Lydia Thompson Date of Encounter: 07/08/2023 [facility] Cardiologist: Elder Negus, MD   Interval Summary  .    No recurrence of arrhythmia, feeling better with diuresis.  Vital Signs .    Vitals:   07/08/23 0506 07/08/23 0841 07/08/23 0848 07/08/23 0905  BP: 138/77 116/71  (!) 142/79  Pulse: 86 84  86  Resp: 19 18  18   Temp: 98.9 F (37.2 C) 99.3 F (37.4 C)  98.5 F (36.9 C)  TempSrc: Oral Oral  Oral  SpO2: 97% 94% 98% 96%  Weight: 101.1 kg     Height:        Intake/Output Summary (Last 24 hours) at 07/08/2023 1106 Last data filed at 07/08/2023 9604 Gross per 24 hour  Intake 120 ml  Output 2700 ml  Net -2580 ml      07/08/2023    5:06 AM 07/07/2023    1:34 PM 07/07/2023    6:32 AM  Last 3 Weights  Weight (lbs) 222 lb 14.2 oz 225 lb 15.5 oz 225 lb  Weight (kg) 101.1 kg 102.5 kg 102.059 kg      Telemetry/ECG    SR - Personally Reviewed  Physical Exam .   GEN: No acute distress.   Neck: No JVD Cardiac: RRR, 3/6 systolic murmur heard best R/L sternal borders Respiratory: Clear to auscultation bilaterally. GI: Soft, nontender, non-distended  MS: trace puffy edema  Assessment & Plan .     Principal Problem:   Acute exacerbation of congestive heart failure (HCC) Active Problems:   Acute respiratory failure with hypoxia (HCC)  Acute diastolic heart failure Progressive dyspnea on exertion, possible dietary indiscretions but compliant with medications.  BNP elevated and chest x-ray consistent with pulmonary edema.  This is likely acute on chronic diastolic heart failure given her severe LVH.  She is already improved with IV Lasix, 40 mg IV twice daily, monitor output and renal function.  Will plan a repeat echo since her last study was in 2023.  Would continue home heart failure medications including metoprolol and spironolactone.  Would consider addition of SGLT2 inhibitor for benefit as well. Diuresing well.  Chest  pain Seems nonanginal in nature, worse with taking a deep breath and may be related to heart failure.  Troponin flat mildly elevated more consistent with demand ischemia.  She had no coronary disease by coronary CT in 2020. HOCM Murmur today on exam, need to review this with updated echo. Unable to view prior images from outside office. ADDENDUM: Echo shows severe LVOT obstruction. Will stop diuretic now (4:17 PM) and assess if AVN blockers can be titrated up. Tachycardia She reports recent tachycardia with heart rates in the 110-120 range.  This is worse when exerting herself and I suspect this is a sinus tachycardia although she has had a history of SVT in the past.  She will be monitored here to see if she is having any paroxysmal tachyarrhythmias. None so far.  For questions or updates, please contact [facility] Please consult [email or web address] for contact info under        Signed, Parke Poisson, MD

## 2023-07-08 NOTE — Assessment & Plan Note (Signed)
Continue with duloxetin.

## 2023-07-08 NOTE — Assessment & Plan Note (Signed)
Calculated BMI is 40.7

## 2023-07-08 NOTE — Progress Notes (Signed)
Pt was able to walk down both ends of the hall, on RA,  saturations were 94-97%, and her HR stayed around the 110-115's, and went up to 120's just 1 time.  Will continue to monitor, Thanks Lavonda Jumbo RN.

## 2023-07-08 NOTE — Progress Notes (Signed)
  Progress Note   Patient: Lydia Thompson:[id number] DOB: [birth year] DOA: 07/07/2023     1 DOS: the patient was seen and examined on 07/08/2023   Brief hospital course: Lydia Thompson was admitted to the hospital with the working diagnosis of heart failure exacerbation.  47 yo female with the past medical history of hypertrophic cardiomyopathy, SVT, and TIA who presented with chest pain and dyspnea. Reported 6 days of worsening dyspnea on exertion, orthopnea, chest pressure and cough. Positive recent weight gain 4 lbs. Endorsed dietary indiscretions. On her initial physical examination her blood pressure was 120/72, HR 76, RR 16 and 02 saturation 97%, lungs with no wheezing or rales, heart with S1 and S2 present and regular with no gallops, rubs or murmurs, abdomen with no distention and positive lower extremity edema.   Chest radiograph with cardiomegaly with bilateral hilar vascular congestion, bilateral central interstitial infiltrates with no effusions.  EKG 88 bpm, normal axis, normal intervals, sinus rhythm with bi atrial enlargement, with ST depression in lead II, III< AvF, V5 and V6 with no significant T wave changes.     Assessment and Plan: * Acute on chronic diastolic CHF (congestive heart failure) (HCC) Echocardiogram with preserved LV systolic function with EF 70 to 75% LVOT gradient 41 mmHg at rest and 51 mmHg with valsalva, severe asymmetric left ventricular hypertrophy of the septal segment. Hypertrophied papillary muscles. RV systolic function is preserved, LA with severe dilatation, SAM of the mitral valve, moderate to severe mitral valve regurgitation.   Documented urine output 1500 cc Systolic blood pressure 116 to 109 mmHg.   Plan to continue diuresis with furosemide 40 mg IV Metoprolol succinate and spironolactone.   Essential hypertension Continue blood pressure control with metoprolol succinate.   Supraventricular tachycardia (HCC) Continue AV blockade with  metoprolol and diltiazem.   Hypokalemia Renal function with serum cr at 0,84 with K at 3,3 and serum bicarbonate at 28,  Na 135   Plan to continue diuresis with furosemide and spironolactone.  Add 40 meq Kcl x 2 doses and follow up renal function and electrolytes in am.   Anxiety disorder Continue with duloxetin.   H/O TIA (transient ischemic attack) and stroke Continue blood pressure control and continue aspirin.   Obesity, Class III, BMI 40-49.9 (morbid obesity) (HCC) Calculated BMI is 40.7         Subjective: Patient is feeling better, dyspnea and edema are improving, no chest pain   Physical Exam: Vitals:   07/08/23 0905 07/08/23 1100 07/08/23 1130 07/08/23 1311  BP: (!) 142/79   109/74  Pulse: 86   76  Resp: 18   18  Temp: 98.5 F (36.9 C)   98.8 F (37.1 C)  TempSrc: Oral   Oral  SpO2: 96% 96% 97% 98%  Weight:      Height:       Neurology awake and alert ENT with mild pallor Cardiovascular with S1 and S2 present, positive systolic murmur at the right lower sternal border, with no gallops or rubs No JVD No lower extremity edema Respiratory with mild rales at bases with no wheezing or rhonchi Abdomen with no distention  Data Reviewed:   Family Communication: no family at the bedside   Disposition: Status is: Inpatient Remains inpatient appropriate because: diuresing with IV furosemide   Planned Discharge Destination: Home      Author: Coralie Keens, MD 07/08/2023 4:59 PM  For on call review [email or web address].

## 2023-07-08 NOTE — Assessment & Plan Note (Signed)
Continue blood pressure control and continue aspirin.

## 2023-07-08 NOTE — Assessment & Plan Note (Signed)
Continue AV blockade with metoprolol and diltiazem.

## 2023-07-08 NOTE — Hospital Course (Signed)
Lydia Thompson was admitted to the hospital with the working diagnosis of heart failure exacerbation.  47 yo female with the past medical history of hypertrophic cardiomyopathy, SVT, and TIA who presented with chest pain and dyspnea. Reported 6 days of worsening dyspnea on exertion, orthopnea, chest pressure and cough. Positive recent weight gain 4 lbs. Endorsed dietary indiscretions. On her initial physical examination her blood pressure was 120/72, HR 76, RR 16 and 02 saturation 97%, lungs with no wheezing or rales, heart with S1 and S2 present and regular with no gallops, rubs or murmurs, abdomen with no distention and positive lower extremity edema.   Na 137, K 3,7 Cl 102 bicarbonate 25, glucose 125, bun 20 cr 0,90  BNP 468  High sensitive troponin 45 and 41  Wbc 17,6 hgb 13,4 plt 288   Chest radiograph with cardiomegaly with bilateral hilar vascular congestion, bilateral central interstitial infiltrates with no effusions.  EKG 88 bpm, normal axis, normal intervals, sinus rhythm with bi atrial enlargement, with ST depression in lead II, III< AvF, V5 and V6 with no significant T wave changes.   Patient was placed on IV furosemide for diuresis with improvement in volume status.   Plan to continue AV blockade with metoprolol and diltiazem.  Follow up as outpatient with cardiac MRI.

## 2023-07-09 ENCOUNTER — Other Ambulatory Visit: Payer: Self-pay | Admitting: Physician Assistant

## 2023-07-09 DIAGNOSIS — E876 Hypokalemia: Secondary | ICD-10-CM | POA: Diagnosis not present

## 2023-07-09 DIAGNOSIS — I10 Essential (primary) hypertension: Secondary | ICD-10-CM | POA: Diagnosis not present

## 2023-07-09 DIAGNOSIS — I471 Supraventricular tachycardia, unspecified: Secondary | ICD-10-CM | POA: Diagnosis not present

## 2023-07-09 DIAGNOSIS — I422 Other hypertrophic cardiomyopathy: Secondary | ICD-10-CM

## 2023-07-09 DIAGNOSIS — I5033 Acute on chronic diastolic (congestive) heart failure: Secondary | ICD-10-CM | POA: Diagnosis not present

## 2023-07-09 DIAGNOSIS — F419 Anxiety disorder, unspecified: Secondary | ICD-10-CM

## 2023-07-09 LAB — BASIC METABOLIC PANEL
Anion gap: 7 (ref 5–15)
BUN: 17 mg/dL (ref 6–20)
CO2: 27 mmol/L (ref 22–32)
Calcium: 9.2 mg/dL (ref 8.9–10.3)
Chloride: 101 mmol/L (ref 98–111)
Creatinine, Ser: 0.89 mg/dL (ref 0.44–1.00)
GFR, Estimated: >60 mL/min (ref >60)
Glucose, Bld: 111 mg/dL — ABNORMAL HIGH (ref 70–99)
Potassium: 4.2 mmol/L (ref 3.5–5.1)
Sodium: 135 mmol/L (ref 135–145)

## 2023-07-09 LAB — MAGNESIUM: Magnesium: 2.7 mg/dL — ABNORMAL HIGH (ref 1.7–2.4)

## 2023-07-09 MED ORDER — FUROSEMIDE 20 MG PO TABS: 20.0000 mg | ORAL_TABLET | Freq: Two times a day (BID) | ORAL | Status: DC

## 2023-07-09 MED ORDER — FUROSEMIDE 20 MG PO TABS: 20.0000 mg | ORAL_TABLET | Freq: Every day | ORAL | Status: DC

## 2023-07-09 MED ORDER — METOPROLOL SUCCINATE ER 25 MG PO TB24
25.0000 mg | ORAL_TABLET | Freq: Once | ORAL | Status: AC
  Administered 2023-07-09: 25 mg via ORAL
  Filled 2023-07-09: qty 1

## 2023-07-09 MED ORDER — METOPROLOL SUCCINATE ER 50 MG PO TB24: 75.0000 mg | ORAL_TABLET | Freq: Every day | ORAL | Status: DC

## 2023-07-09 MED ORDER — METOPROLOL SUCCINATE ER 25 MG PO TB24
75.0000 mg | ORAL_TABLET | Freq: Every day | ORAL | 0 refills | Status: DC
  Filled 2023-07-09: qty 30, 10d supply, fill #0

## 2023-07-09 NOTE — Progress Notes (Signed)
Patient Name: Lydia Thompson Date of Encounter: 07/09/2023 [facility] Cardiologist: Elder Negus, MD   Interval Summary  .    No recurrence of arrhythmia, feeling better with diuresis.  Vital Signs .    Vitals:   07/08/23 1934 07/09/23 0009 07/09/23 0523 07/09/23 0905  BP: 122/71 99/68 110/71 108/72  Pulse: 71  63 67  Resp: 18 17 18 20   Temp: 98.1 F (36.7 C) 98.1 F (36.7 C) 98.1 F (36.7 C) 97.8 F (36.6 C)  TempSrc: Oral Oral Oral Oral  SpO2: 95% 96% 97% 94%  Weight:   101 kg   Height:        Intake/Output Summary (Last 24 hours) at 07/09/2023 1114 Last data filed at 07/09/2023 0904 Gross per 24 hour  Intake 957 ml  Output 1901 ml  Net -944 ml      07/09/2023    5:23 AM 07/08/2023    5:06 AM 07/07/2023    1:34 PM  Last 3 Weights  Weight (lbs) 222 lb 9.6 oz 222 lb 14.2 oz 225 lb 15.5 oz  Weight (kg) 100.971 kg 101.1 kg 102.5 kg      Telemetry/ECG    SR - Personally Reviewed  Physical Exam .   GEN: No acute distress.   Neck: No JVD Cardiac: RRR, 2/6 systolic murmur heard best R/L sternal borders, also now clearly heard at lateral precordial Respiratory: Clear to auscultation bilaterally. GI: Soft, nontender, non-distended  MS: trace puffy edema  Assessment & Plan .     Principal Problem:   Acute on chronic diastolic CHF (congestive heart failure) (HCC) Active Problems:   Essential hypertension   H/O TIA (transient ischemic attack) and stroke   Obesity, Class III, BMI 40-49.9 (morbid obesity) (HCC)   Anxiety disorder   Supraventricular tachycardia (HCC)   Hypokalemia  Acute diastolic heart failure Progressive dyspnea on exertion, possible dietary indiscretions but compliant with medications.  BNP elevated and chest x-ray consistent with pulmonary edema.  This is likely acute on chronic diastolic heart failure given her severe LVH.  She is already improved with IV Lasix, 40 mg IV twice daily, monitor output and renal function.   Repeat echo showed severe septal LVH with LVOT obstruction, she carries a dx of HCM but has not yet had an MRI or further workup. Suspect oHCM is the case.  Chest pain Seems nonanginal in nature, worse with taking a deep breath and may be related to heart failure.  Troponin flat mildly elevated more consistent with demand ischemia.  She had no coronary disease by coronary CT in 2020. HCM Given severe lvot obstruction, will uptitrate BB and order outpatient cardiac MRI. She appears stable for discharge and had no symptoms ambulating. Discussed in detail the balance between volume overload and volume depletion and how this complicates management of HCM. Recommend adequate hydration but also adhering to heart healthy diet. Will order outpt CMR for HCM and f/u with her cardiologist.  Tachycardia She reports recent tachycardia with heart rates in the 110-120 range.  This is worse when exerting herself and I suspect this is a sinus tachycardia although she has had a history of SVT in the past.  She will be monitored here to see if she is having any paroxysmal tachyarrhythmias. None so far. Needs further AVN blockade to keep HR low for LV filling to avoid obstruction.  For questions or updates, please contact [facility] Please consult [email or web address] for contact info under  Signed, Parke Poisson, MD

## 2023-07-09 NOTE — Discharge Summary (Addendum)
Physician Discharge Summary   Patient: Lydia Thompson MRN: [id number] DOB: [birth year]  Admit date:     07/07/2023  Discharge date: 07/09/23  Discharge Physician: York Ram Aaleah Hirsch   PCP: Lula Olszewski, MD   Recommendations at discharge:    Plan to continue furosemide 20 mg po bid, avoid over diuresis due to LV outflow obstruction related to hypertrophic cardiomyopathy.  Metoprolol succinate dose increased to 75 mg po daily. Follow up with Dr Jon Billings in 7 to 10 days.  Follow up with Cardiology as outpatient as scheduled. Patient will need cardiac MRI.  Follow up renal function and electrolytes in 7 days as outpatient.   Discharge Diagnoses: Principal Problem:   Acute on chronic diastolic CHF (congestive heart failure) (HCC) Active Problems:   Essential hypertension   Supraventricular tachycardia (HCC)   Hypokalemia   Anxiety disorder   H/O TIA (transient ischemic attack) and stroke   Obesity, Class III, BMI 40-49.9 (morbid obesity) (HCC)  Resolved Problems:   * No resolved hospital problems. [facility] Course: Mrs. Darrell was admitted to the hospital with the working diagnosis of heart failure exacerbation.  47 yo female with the past medical history of hypertrophic cardiomyopathy, SVT, and TIA who presented with chest pain and dyspnea. Reported 6 days of worsening dyspnea on exertion, orthopnea, chest pressure and cough. Positive recent weight gain 4 lbs. Endorsed dietary indiscretions. On her initial physical examination her blood pressure was 120/72, HR 76, RR 16 and 02 saturation 97%, lungs with no wheezing or rales, heart with S1 and S2 present and regular with no gallops, rubs or murmurs, abdomen with no distention and positive lower extremity edema.   Na 137, K 3,7 Cl 102 bicarbonate 25, glucose 125, bun 20 cr 0,90  BNP 468  High sensitive troponin 45 and 41  Wbc 17,6 hgb 13,4 plt 288   Chest radiograph with cardiomegaly with bilateral hilar vascular  congestion, bilateral central interstitial infiltrates with no effusions.  EKG 88 bpm, normal axis, normal intervals, sinus rhythm with bi atrial enlargement, with ST depression in lead II, III< AvF, V5 and V6 with no significant T wave changes.   Patient was placed on IV furosemide for diuresis with improvement in volume status.   Plan to continue AV blockade with metoprolol and diltiazem.  Follow up as outpatient with cardiac MRI.    Assessment and Plan: * Acute on chronic diastolic CHF (congestive heart failure) (HCC) Echocardiogram with preserved LV systolic function with EF 70 to 75% LVOT gradient 41 mmHg at rest and 51 mmHg with valsalva, severe asymmetric left ventricular hypertrophy of the septal segment. Hypertrophied papillary muscles. RV systolic function is preserved, LA with severe dilatation, SAM of the mitral valve, moderate to severe mitral valve regurgitation.   Patient was placed on IV furosemide for diuresis, negative fluid balance was achieved, -3,524 ml, with significant improvement in her symptoms.    Plan to continue AV blockade with diltiazem and metoprolol, dose of metoprolol will be increased to 75 mg daily. Will need outpatient follow up with cardiac MRI.  Continue diuresis with furosemide 20 mg po bid. Ovoid over diuresis due to obstructive physiology.   Troponin elevation consistent with demand ischemic, ruled out for acute coronary syndrome.   Essential hypertension Continue blood pressure control with metoprolol succinate, dose will be increased to 75 mg.  Supraventricular tachycardia (HCC) Continue AV blockade with metoprolol and diltiazem.   Hypokalemia Renal function with serum cr at 0,89 with K at 4,2 and  serum bicarbonate at 27.  Na 135 and Mg at 2.7   Plan to follow up renal function as outpatient.  Patient may need K supplementation depending on outpatient K levels.   Anxiety disorder Continue with duloxetin.   H/O TIA (transient ischemic  attack) and stroke Continue blood pressure control and continue aspirin.   Obesity, Class III, BMI 40-49.9 (morbid obesity) (HCC) Calculated BMI is 40.7          Consultants: cardiology  Procedures performed: none   Disposition: Home Diet recommendation:  Discharge Diet Orders (From admission, onward)     Start     Ordered   07/09/23 0000  Diet - low sodium heart healthy        07/09/23 1153           Cardiac and Carb modified diet DISCHARGE MEDICATION: Allergies as of 07/09/2023   No Known Allergies      Medication List     STOP taking these medications    meclizine 25 MG tablet Commonly known as: ANTIVERT   metFORMIN 500 MG tablet Commonly known as: GLUCOPHAGE   Premarin vaginal cream Generic drug: conjugated estrogens   Semaglutide-Weight Management 1 MG/0.5ML Soaj   Semaglutide-Weight Management 1.7 MG/0.75ML Soaj   Semaglutide-Weight Management 2.4 MG/0.75ML Soaj   topiramate 25 MG tablet Commonly known as: Topamax   Trulicity 0.75 MG/0.5ML Soaj Generic drug: Dulaglutide   Trulicity 3 MG/0.5ML Soaj Generic drug: Dulaglutide   Trulicity 4.5 MG/0.5ML Soaj Generic drug: Dulaglutide   Zepbound 2.5 MG/0.5ML Pen Generic drug: tirzepatide       TAKE these medications    aspirin EC 81 MG tablet Take 1 tablet (81 mg total) by mouth daily. Swallow whole.   diltiazem 180 MG 24 hr capsule Commonly known as: CARDIZEM CD Take 1 capsule (180 mg total) by mouth daily.   DULoxetine 60 MG capsule Commonly known as: Cymbalta Take 1 capsule (60 mg total) by mouth daily. Replaces 30 mg dose   estradiol 0.1 MG/GM vaginal cream Commonly known as: ESTRACE Limit use to minimum amount to control symptoms. What changed:  how much to take how to take this when to take this reasons to take this additional instructions   furosemide 20 MG tablet Commonly known as: LASIX Take 1 tablet (20 mg total) by mouth 2 (two) times daily.   metoprolol  succinate 25 MG 24 hr tablet Commonly known as: TOPROL-XL Take 3 tablets (75 mg total) by mouth daily. Take with or immediately following a meal. Start taking on: July 10, 2023 What changed:  medication strength how much to take additional instructions   ondansetron 4 MG disintegrating tablet Commonly known as: ZOFRAN-ODT Take 1 tablet (4 mg total) by mouth every 8 (eight) hours as needed for nausea or vomiting (for nausea from wegovy or other source). What changed:  when to take this reasons to take this   spironolactone 25 MG tablet Commonly known as: ALDACTONE Take 1 tablet (25 mg total) by mouth daily.   Veozah 45 MG Tabs Generic drug: Fezolinetant Take 1 tablet (45 mg total) by mouth daily at 6 (six) AM.        Follow-up Information     Patwardhan, Anabel Bene, MD Follow up on 07/31/2023.   Specialties: Cardiology, Radiology Why: follow up appt at 11:40 AM - please arrive 15 min early Our office will arrange a cardiac MRI Contact information: [address] [place] [address] [phone]  Discharge Exam: Filed Weights   07/07/23 1334 07/08/23 0506 07/09/23 0523  Weight: 102.5 kg 101.1 kg 101 kg   BP 108/72 (BP Location: Right Arm)   Pulse 67   Temp 97.8 F (36.6 C) (Oral)   Resp 20   Ht 5\' 2"  (1.575 m)   Wt 101 kg   SpO2 94%   BMI 40.71 kg/m   Patient is feeling better, no chest pain or dyspnea, no edema, PND or orthopnea.   Neurology awake and alert ENT with no pallor Cardiovascular with S1 and S2 present and regular, positive systolic murmur at the base with no gallops, rubs or murmurs. No JVD No lower extremity edema  Respiratory with no rales or wheezing. No rhonchi  Abdomen with no distention   Condition at discharge: stable  The results of significant diagnostics from this hospitalization (including imaging, microbiology, ancillary and laboratory) are listed below for reference.   Imaging  Studies: ECHOCARDIOGRAM COMPLETE  Result Date: 07/08/2023    ECHOCARDIOGRAM REPORT   Patient Name:   LYNNA CARLSSON Date of Exam: 07/08/2023 Medical Rec #:  [id number]     Height:       62.0 in Accession #:    [id number]    Weight:       222.9 lb Date of Birth:  June 25, 1976    BSA:          2.002 m Patient Age:    47 years      BP:           109/74 mmHg Patient Gender: F             HR:           66 bpm. Exam Location:  Inpatient Procedure: 2D Echo, Color Doppler, Cardiac Doppler and Intracardiac            Opacification Agent Indications:    R06.9 DOE  History:        Patient has prior history of Echocardiogram examinations, most                 recent 05/29/2022. CHF, Pulmonary HTN; Risk Factors:Hypertension.  Sonographer:    Irving Burton Senior RDCS Referring Phys: [id number] PROSPER M AMPONSAH IMPRESSIONS  1. 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 Left ventricular ejection fraction, by estimation, is 70 to 75%. The left ventricle has hyperdynamic  function. The left ventricle has no regional wall motion abnormalities. There is severe asymmetric left ventricular hypertrophy of the septal segment (basal septum measures 20 mm). Hypertrophied papillary muscles. Left ventricular diastolic parameters are indeterminate.  2. Right ventricular systolic function is normal. The right ventricular size is normal. Tricuspid regurgitation signal is inadequate for assessing PA pressure.  3. Left atrial size was severely dilated.  4. Systolic anterior motion of the mitral valve in the setting of LVOT obstruction. The mitral valve is grossly normal. Moderate to severe mitral valve regurgitation. No evidence of mitral stenosis.  5. The aortic valve is grossly normal. Aortic valve regurgitation is not visualized. No aortic stenosis is present.  6. The inferior vena cava is normal in size with greater than 50% respiratory  variability, suggesting right atrial pressure of 3 mmHg. 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 Left ventricular ejection fraction, by estimation, is 70 to  75%. The left ventricle has  hyperdynamic function. The left ventricle has no regional wall motion abnormalities. Definity contrast agent was given IV to delineate the left ventricular endocardial borders. The left ventricular internal cavity size was normal in size. There is severe asymmetric left ventricular hypertrophy of the septal segment. Left ventricular diastolic parameters are indeterminate. Right Ventricle: The right ventricular size is normal. Right vetricular wall thickness was not well visualized. Right ventricular systolic function is normal. Tricuspid regurgitation signal is inadequate for assessing PA pressure. Left Atrium: Left atrial size was severely dilated. Right Atrium: Right atrial size was normal in size. Pericardium: There is no evidence of pericardial effusion. Mitral Valve: Systolic anterior motion of the mitral valve in the setting of LVOT obstruction. The mitral valve is grossly normal. Moderate to severe mitral valve regurgitation, with eccentric posteriorly directed jet. No evidence of mitral valve stenosis. Tricuspid Valve: The tricuspid valve is normal in structure. Tricuspid valve regurgitation is trivial. No evidence of tricuspid stenosis. Aortic Valve: The aortic valve is grossly normal. Aortic valve regurgitation is not visualized. No aortic stenosis is present. Pulmonic Valve: The pulmonic valve was normal in structure. Pulmonic valve regurgitation is trivial. No evidence of pulmonic stenosis. Aorta: The aortic root and ascending aorta are structurally normal, with no evidence of dilitation. Venous: The inferior vena cava is normal in size with greater than 50% respiratory variability,  suggesting right atrial pressure of 3 mmHg. IAS/Shunts: The interatrial septum was not well visualized.  LEFT VENTRICLE PLAX 2D LVIDd:         3.80 cm   Diastology LVIDs:         2.40 cm   LV e' medial:    7.40 cm/s LV PW:         1.20 cm   LV E/e' medial:  13.0 LV IVS:        2.00 cm   LV e' lateral:   10.90 cm/s LVOT diam:     2.00 cm   LV E/e' lateral: 8.8 LVOT Area:     3.14 cm  RIGHT VENTRICLE RV S prime:     13.90 cm/s TAPSE (M-mode): 1.8 cm LEFT ATRIUM              Index        RIGHT ATRIUM           Index LA diam:        5.00 cm  2.50 cm/m   RA Area:     15.80 cm LA Vol (A2C):   87.7 ml  43.81 ml/m  RA Volume:   41.00 ml  20.48 ml/m LA Vol (A4C):   130.0 ml 64.94 ml/m LA Biplane Vol: 112.0 ml 55.95 ml/m   AORTA Ao Root diam: 2.80 cm Ao Asc diam:  3.20 cm MITRAL VALVE MV Area (PHT): 3.13 cm       SHUNTS MV Decel Time: 242 msec       Systemic Diam: 2.00 cm MR Peak grad:    99.6 mmHg MR Mean grad:    77.0 mmHg MR Vmax:         499.00 cm/s MR Vmean:        427.0 cm/s MR PISA:         2.26 cm MR PISA Eff ROA: 17 mm MR PISA Radius:  0.60 cm MV E velocity: 95.90 cm/s MV A velocity: 65.50 cm/s MV E/A ratio:  1.46 Weston Brass MD Electronically signed by Weston Brass MD Signature Date/Time: 07/08/2023/4:16:34  PM    Final    DG Chest 2 View  Result Date: 07/07/2023 CLINICAL DATA:  Chest pain that started on Sunday.  History of CHF EXAM: CHEST - 2 VIEW COMPARISON:  02/19/2020 FINDINGS: Diffuse interstitial opacity with Kerley lines, fissure thickening, airway cuffing. Symmetric hazy airspace disease. Borderline heart size. Upper mediastinal contours are stable. IMPRESSION: Pulmonary edema. Electronically Signed   By: Tiburcio Pea M.D.   On: 07/07/2023 07:06    Microbiology: Results for orders placed or performed in visit on 04/09/21  GC/Chlamydia Probe Amp (genital or urine) ([facility] / [facility])     Status: None   Collection Time: 04/09/21  1:59 PM   Specimen: Urine   UR  Result Value Ref  Range Status   Chlamydia trachomatis, NAA Negative Negative Final   Neisseria Gonorrhoeae by PCR Negative Negative Final    Labs: CBC: Recent Labs  Lab 07/07/23 0637 07/08/23 0241  WBC 17.6* 13.7*  NEUTROABS  --  8.6*  HGB 13.4 12.9  HCT 41.0 39.1  MCV 83.8 82.1  PLT 288 283   Basic Metabolic Panel: Recent Labs  Lab 07/07/23 0637 07/08/23 0241 07/09/23 0313  NA 137 135 135  K 3.7 3.3* 4.2  CL 102 97* 101  CO2 25 28 27   GLUCOSE 125* 109* 111*  BUN 20 20 17   CREATININE 0.90 0.84 0.89  CALCIUM 8.9 9.3 9.2  MG  --  2.2 2.7*  PHOS  --  4.2  --    Liver Function Tests: Recent Labs  Lab 07/08/23 0241  AST 14*  ALT 16  ALKPHOS 65  BILITOT 1.2*  PROT 7.7  ALBUMIN 3.6   CBG: No results for input(s): "GLUCAP" in the last 168 hours.  Discharge time spent: greater than 30 minutes.  Signed: Coralie Keens, MD [facility] 07/09/2023

## 2023-07-09 NOTE — Plan of Care (Signed)
  Problem: Education: Goal: Knowledge of General Education information will improve Description: Including pain rating scale, medication(s)/side effects and non-pharmacologic comfort measures Outcome: Progressing   Problem: Health Behavior/Discharge Planning: Goal: Ability to manage health-related needs will improve Outcome: Progressing   Problem: Clinical Measurements: Goal: Ability to maintain clinical measurements within normal limits will improve Outcome: Progressing Goal: Will remain free from infection Outcome: Progressing Goal: Diagnostic test results will improve Outcome: Progressing Goal: Respiratory complications will improve Outcome: Progressing Goal: Cardiovascular complication will be avoided Outcome: Progressing   Problem: Activity: Goal: Risk for activity intolerance will decrease Outcome: Progressing   Problem: Coping: Goal: Level of anxiety will decrease Outcome: Progressing   Problem: Safety: Goal: Ability to remain free from injury will improve Outcome: Progressing   Problem: Education: Goal: Ability to demonstrate management of disease process will improve Outcome: Progressing Goal: Ability to verbalize understanding of medication therapies will improve Outcome: Progressing Goal: Individualized Educational Video(s) Outcome: Progressing   Problem: Activity: Goal: Capacity to carry out activities will improve Outcome: Progressing   Problem: Cardiac: Goal: Ability to achieve and maintain adequate cardiopulmonary perfusion will improve Outcome: Progressing

## 2023-07-10 ENCOUNTER — Telehealth: Payer: Self-pay

## 2023-07-10 ENCOUNTER — Other Ambulatory Visit: Payer: Self-pay

## 2023-07-10 NOTE — Transitions of Care (Post Inpatient/ED Visit) (Addendum)
07/10/2023  Name: Lydia Thompson MRN: [id number] DOB: 1976-01-19  Today's TOC FU Call Status: Today's TOC FU Call Status:: Successful TOC FU Call Completed TOC FU Call Complete Date: 07/10/23 (Incoming call from pt returning RN CM call) Patient's Name and Date of Birth confirmed.  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) Any questions or concerns?: No  Items Reviewed: Did you receive and understand the discharge instructions provided?: Yes Medications obtained,verified, and reconciled?: Yes (Medications Reviewed) Any new allergies since your discharge?: No Dietary orders reviewed?: Yes Type of Diet Ordered:: low salt/heart healthy Do you have support at home?: Yes People in Home: spouse Name of Support/Comfort Primary Source: spouse  Medications Reviewed Today: Medications Reviewed Today     Reviewed by Charlyn Minerva, RN (Registered Nurse) on 07/10/23 at 1351  Med List Status: <None>   Medication Order Taking? Sig Documenting Provider Last Dose Status Informant  aspirin EC 81 MG tablet [id number] Yes Take 1 tablet (81 mg total) by mouth daily. Swallow whole. Patwardhan, Anabel Bene, MD Taking Active Self, Pharmacy Records  diltiazem (CARDIZEM CD) 180 MG 24 hr capsule [id number] Yes Take 1 capsule (180 mg total) by mouth daily. Elder Negus, MD Taking Active Self, Pharmacy Records    Discontinued 09/01/22 [phone] (Reorder)   DULoxetine (CYMBALTA) 60 MG capsule [id number] Yes Take 1 capsule (60 mg total) by mouth daily. Replaces 30 mg dose  Lula Olszewski, MD Taking Active Self, Pharmacy Records  estradiol (ESTRACE) 0.1 MG/GM vaginal cream [id number] Yes Limit use to minimum amount to control symptoms.  Patient taking differently: Place 1 Applicatorful vaginally as needed (dryness).   Lula Olszewski, MD Taking Active Self, Pharmacy Records  Fezolinetant [facility]) 45 MG TABS [id number] No Take 1 tablet (45 mg total) by mouth daily at 6 (six) AM.  Patient not taking: Reported on 07/10/2023   Lula Olszewski, MD Not Taking Active Self, Pharmacy Records           Med Note (CRUTHIS, Marcy Siren   Fri Jul 07, 2023  1:53 PM) Pt stated since last dose she has since ran out of this medication.   furosemide (LASIX) 20 MG tablet [id number] Yes Take 1 tablet (20 mg total) by mouth 2 (two) times daily. Arrien, York Ram, MD Taking Active     Discontinued 10/23/20 1002 (Patient Preference)   metoprolol succinate (TOPROL-XL) 25 MG 24 hr tablet [id number] Yes Take 3 tablets (75 mg total) by mouth daily. Take with or immediately following a meal. Arrien, York Ram, MD Taking Active   ondansetron (ZOFRAN-ODT) 4 MG disintegrating tablet [id number] Yes Take 1 tablet (4 mg total) by mouth every 8 (eight) hours as needed for nausea or vomiting (for nausea from wegovy or other source).  Patient taking differently: Take 4 mg by mouth as needed for nausea or vomiting.   Lula Olszewski, MD Taking Active Self, Pharmacy Records  spironolactone (ALDACTONE) 25 MG tablet [id number] Yes Take 1 tablet (25 mg total) by mouth daily. Elder Negus, MD Taking Active Self, Pharmacy Records  Home Care and Equipment/Supplies: Were Home Health Services Ordered?: NA Any new equipment or medical supplies ordered?: NA  Functional Questionnaire: Do you need assistance with bathing/showering or dressing?: No Do you need assistance with meal preparation?: No Do you need assistance with eating?: No Do you have difficulty maintaining continence:  No Do you need assistance with getting out of bed/getting out of a chair/moving?: No Do you have difficulty managing or taking your medications?: No  Follow up appointments reviewed: PCP Follow-up appointment confirmed?: Yes Date of PCP follow-up appointment?: 07/24/23 Follow-up Provider: Dr. Jon Billings Specialist [facility] Follow-up appointment confirmed?: Yes Date of Specialist follow-up appointment?: 07/31/23 Follow-Up Specialty Provider:: Dr. Joaquim Nam Do you need transportation to your follow-up appointment?: No (pt confirms she is able to drive herself to appts) Do you understand care options if your condition(s) worsen?: Yes-patient verbalized understanding  SDOH Interventions Today    Flowsheet Row Most Recent Value  SDOH Interventions   Food Insecurity Interventions Intervention Not Indicated  Housing Interventions Intervention Not Indicated  Transportation Interventions Intervention Not Indicated  Utilities Interventions Intervention Not Indicated       Goals Addressed             This Visit's Progress    TOC Care Plan       Current Barriers:  Knowledge Deficits related to plan of care for management of CHF   RNCM Clinical Goal(s):  Patient will work with the Care Management team over the next 30 days to address Transition of Care Barriers: CHF mgmt verbalize understanding of plan for management of CHF as evidenced by adherence to plan of care take all medications exactly as prescribed and will call provider for medication related questions as evidenced by med adherence/compliance demonstrate Improved health management independence as evidenced by no exacerbation of sxs or readmission within the next 30 days  through collaboration with RN Care manager, provider, and care team.   Interventions: Evaluation of current treatment plan related to  self management and patient's adherence to plan as established by provider   Heart Failure Interventions:  (Status:  New  goal.) Short Term Goal Basic overview and discussion of pathophysiology of Heart Failure reviewed Provided education on low sodium diet Provided education about placing scale on hard, flat surface Advised patient to weigh each morning after emptying bladder Discussed importance of daily weight and advised patient to weigh and record daily Reviewed role of diuretics in prevention of fluid overload and management of heart failure; Discussed the importance of keeping all appointments with provider Assessed social determinant of health barriers -no needs at present-encouraged to review health plan benefits & services  Patient Goals/Self-Care Activities: Participate in Transition of Care Program/Attend TOC scheduled calls Take all medications as prescribed Attend all scheduled provider appointments Call provider office for new concerns or questions   call office if I gain more than 2 pounds in one day or 5 pounds in one week track weight in diary watch for swelling in feet, ankles and legs every day  Follow Up Plan:  Telephone follow up appointment with care management team member scheduled for:  07/17/23-3pm The patient has been provided with contact information for the care management team and has been advised to call with any health related questions or concerns.           Goals Addressed             This Visit's Progress    TOC Care Plan       Current Barriers:  Knowledge Deficits related to plan of care for management of CHF   RNCM Clinical Goal(s):  Patient will work with the Care Management team over the next 30 days to address Transition of Care Barriers: CHF mgmt verbalize understanding of plan for management of CHF as evidenced by adherence to plan of care take all medications exactly as prescribed and will call provider for medication related questions as evidenced by med adherence/compliance demonstrate Improved health management independence as evidenced by no  exacerbation of sxs or readmission within the next 30 days  through collaboration with RN Care manager, provider, and care team.   Interventions: Evaluation of current treatment plan related to  self management and patient's adherence to plan as established by provider   Heart Failure Interventions:  (Status:  New goal.) Short Term Goal Basic overview and discussion of pathophysiology of Heart Failure reviewed Provided education on low sodium diet Provided education about placing scale on hard, flat surface Advised patient to weigh each morning after emptying bladder Discussed importance of daily weight and advised patient to weigh and record daily Reviewed role of diuretics in prevention of fluid overload and management of heart failure; Discussed the importance of keeping all appointments with provider Assessed social determinant of health barriers -no needs at present-encouraged to review health plan benefits & services  Patient Goals/Self-Care Activities: Participate in Transition of Care Program/Attend TOC scheduled calls Take all medications as prescribed Attend all scheduled provider appointments Call provider office for new concerns or questions   call office if I gain more than 2 pounds in one day or 5 pounds in one week track weight in diary watch for swelling in feet, ankles and legs every day  Follow Up Plan:  Telephone follow up appointment with care management team member scheduled for:  07/17/23-3pm The patient has been provided with contact information for the care management team and has been advised to call with any health related questions or concerns.           Antionette Fairy, RN,BSN,CCM RN Care Manager Transitions of Care  [place]  Direct Phone: [phone] Toll Free: [phone] Fax: [phone]

## 2023-07-10 NOTE — Transitions of Care (Post Inpatient/ED Visit) (Signed)
   07/10/2023  Name: Lydia Thompson MRN: [id number] DOB: May 28, 1976  Today's TOC FU Call Status: Today's TOC FU Call Status:: Unsuccessful Call (1st Attempt) Unsuccessful Call (1st Attempt) Date: 07/10/23  Attempted to reach the patient regarding the most recent Inpatient/ED visit.  Follow Up Plan: Additional outreach attempts will be made to reach the patient to complete the Transitions of Care (Post Inpatient/ED visit) call.      Antionette Fairy, RN,BSN,CCM RN Care Manager Transitions of Care  [facility]  Direct Phone: [phone] Toll Free: [phone] Fax: [phone]

## 2023-07-12 ENCOUNTER — Other Ambulatory Visit: Payer: Self-pay

## 2023-07-12 ENCOUNTER — Other Ambulatory Visit: Payer: Self-pay | Admitting: Cardiology

## 2023-07-12 DIAGNOSIS — I422 Other hypertrophic cardiomyopathy: Secondary | ICD-10-CM

## 2023-07-12 DIAGNOSIS — I10 Essential (primary) hypertension: Secondary | ICD-10-CM

## 2023-07-12 DIAGNOSIS — I5032 Chronic diastolic (congestive) heart failure: Secondary | ICD-10-CM

## 2023-07-17 ENCOUNTER — Other Ambulatory Visit: Payer: Self-pay

## 2023-07-17 NOTE — Patient Instructions (Signed)
Visit Information  Thank you for taking time to visit with me today. You are doing a great job with making lifestyle changes to improve your overall health. Keep up the great work! Please don't hesitate to contact me if I can be of assistance to you before our next scheduled telephone appointment.  Our next appointment is by telephone on 07/24/23 at 3pm  Following is a copy of your care plan:   Goals Addressed             This Visit's Progress    TOC Care Plan       /Current Barriers:  Knowledge Deficits related to plan of care for management of CHF   RNCM Clinical Goal(s):  Patient will work with the Care Management team over the next 30 days to address Transition of Care Barriers: CHF mgmt verbalize understanding of plan for management of CHF as evidenced by adherence to plan of care take all medications exactly as prescribed and will call provider for medication related questions as evidenced by med adherence/compliance demonstrate Improved health management independence as evidenced by no exacerbation of sxs or readmission within the next 30 days  through collaboration with RN Care manager, provider, and care team.   Interventions: Evaluation of current treatment plan related to  self management and patient's adherence to plan as established by provider   Heart Failure Interventions:  (Status:  Goal on track:  Yes.) Short Term Goal Continued HF education and reinforcement to pt-she is eager to learn info and motivated to pt lifestyle changes to improve her overall health Reviewed wgt and BP log with pt-she continues to monitor wgt and BP daily,- wgt 222lbs, BP 125/71-denies any swelling,SOB or other sxs Continued with HF diet education- importance of making "healthier food options", dicussed reading food labels when grocery shopping, holiday eating and sticking to diet Reviewed with pt upcoming appt schedule-She has PCP appt on 07/24/23, MRI appt-07/26/23 and cardiology appt on  07/31/23  /Patient Goals/Self-Care Activities: Participate in Transition of Care Program/Attend TOC scheduled calls Take all medications as prescribed Attend all scheduled provider appointments Call provider office for new concerns or questions   call office if I gain more than 2 pounds in one day or 5 pounds in one week track weight in diary watch for swelling in feet, ankles and legs every day Take wgt and BP log to all MD appts for provider to review Read food labels when grocery shopping Try to avoid "skipping" meals  Follow Up Plan:  Telephone follow up appointment with care management team member scheduled for:  07/24/23-3pm The patient has been provided with contact information for the care management team and has been advised to call with any health related questions or concerns.          Patient verbalizes understanding of instructions and care plan provided today and agrees to view in MyChart. Active MyChart status and patient understanding of how to access instructions and care plan via MyChart confirmed with patient.     The patient has been provided with contact information for the care management team and has been advised to call with any health related questions or concerns.   Please call the care guide team at [phone] if you need to cancel or reschedule your appointment.   Please call 1-800-273-TALK (toll free, 24 hour hotline) go to [facility] [address] [place] [phone]) if you are experiencing a Mental Health or Behavioral Health Crisis or need someone  to talk to.  Antionette Fairy, RN,BSN,CCM RN Care Manager Transitions of Care  [place]  Direct Phone: [phone] Toll Free: [phone] Fax: [phone]'

## 2023-07-17 NOTE — Patient Outreach (Signed)
Care Management  Transitions of Care Program Transitions of Care Post-discharge week 2   07/17/2023 Name: Lydia Thompson MRN: [id number] DOB: 04/17/1976  Subjective: Lydia Thompson is a 47 y.o. year old female who is a primary care patient of Lula Olszewski, MD. The Care Management team  Engaged with patient by telephone to assess and address transitions of care needs.   Consent to Services:  Patient was given information about care management services, agreed to services, and gave verbal consent to participate.   Assessment:      Pt voices she is doing well-has had a good week so far. She is trying to adhere to HF plan of care. She has supportive friends and family who are helping her stay accountable. Denies any acute sxs. No RN CM needs or concerns at this time.       SDOH Interventions    Flowsheet Row Telephone from 07/10/2023 in [facility] DEPARTMENT  SDOH Interventions   Food Insecurity Interventions Intervention Not Indicated  Housing Interventions Intervention Not Indicated  Transportation Interventions Intervention Not Indicated  Utilities Interventions Intervention Not Indicated        Goals Addressed             This Visit's Progress    TOC Care Plan       /Current Barriers:  Knowledge Deficits related to plan of care for management of CHF   RNCM Clinical Goal(s):  Patient will work with the Care Management team over the next 30 days to address Transition of Care Barriers: CHF mgmt verbalize understanding of plan for management of CHF as evidenced by adherence to plan of care take all medications exactly as prescribed and will call provider for medication related questions as evidenced by med adherence/compliance demonstrate Improved health management independence as evidenced by no exacerbation of sxs or readmission within the next 30 days  through collaboration with RN Care manager, provider, and care team.    Interventions: Evaluation of current treatment plan related to  self management and patient's adherence to plan as established by provider   Heart Failure Interventions:  (Status:  Goal on track:  Yes.) Short Term Goal Continued HF education and reinforcement to pt-she is eager to learn info and motivated to pt lifestyle changes to improve her overall health Reviewed wgt and BP log with pt-she continues to monitor wgt and BP daily,- wgt 222lbs, BP 125/71-denies any swelling,SOB or other sxs Continued with HF diet education- importance of making "healthier food options", dicussed reading food labels when grocery shopping, holiday eating and sticking to diet Reviewed with pt upcoming appt schedule-She has PCP appt on 07/24/23, MRI appt-07/26/23 and cardiology appt on 07/31/23  /Patient Goals/Self-Care Activities: Participate in Transition of Care Program/Attend TOC scheduled calls Take all medications as prescribed Attend all scheduled provider appointments Call provider office for new concerns or questions   call office if I gain more than 2 pounds in one day or 5 pounds in one week track weight in diary watch for swelling in feet, ankles and legs every day Take wgt and BP log to all MD appts for provider to review Read food labels when grocery shopping Try to avoid "skipping" meals  Follow Up Plan:  Telephone follow up appointment with care management team member scheduled for:  07/24/23-3pm The patient has been provided with contact information for the care management team and has been advised to call with any health related questions or concerns.  Plan: The patient has been provided with contact information for the care management team and has been advised to call with any health related questions or concerns.  Follow up appt made with pt for 07/24/23-3pm.  Antionette Fairy, RN,BSN,CCM RN Care Manager Transitions of Care  [facility]  Direct  Phone: [phone] Toll Free: [phone] Fax: [phone]

## 2023-07-18 ENCOUNTER — Other Ambulatory Visit: Payer: Self-pay

## 2023-07-19 ENCOUNTER — Telehealth: Payer: Self-pay | Admitting: Cardiology

## 2023-07-19 ENCOUNTER — Other Ambulatory Visit: Payer: Self-pay

## 2023-07-19 DIAGNOSIS — I422 Other hypertrophic cardiomyopathy: Secondary | ICD-10-CM

## 2023-07-19 DIAGNOSIS — I10 Essential (primary) hypertension: Secondary | ICD-10-CM

## 2023-07-19 DIAGNOSIS — I5032 Chronic diastolic (congestive) heart failure: Secondary | ICD-10-CM

## 2023-07-19 MED ORDER — FUROSEMIDE 20 MG PO TABS
20.0000 mg | ORAL_TABLET | Freq: Two times a day (BID) | ORAL | 3 refills | Status: DC
  Filled 2023-07-19: qty 60, 30d supply, fill #0
  Filled 2023-08-04: qty 180, 90d supply, fill #1
  Filled 2023-08-04 – 2023-08-16 (×2): qty 60, 30d supply, fill #1
  Filled 2023-09-04 – 2023-09-12 (×4): qty 60, 30d supply, fill #2
  Filled 2023-09-20 – 2023-09-23 (×4): qty 60, 30d supply, fill #3

## 2023-07-19 NOTE — Telephone Encounter (Signed)
Dr. Rosemary Holms, please advise if you would like for her to continue lasix 20 mg po bid until she see's you on 12/9 in the office.    Last discharge note indicated lasix 20 mg po bid, but this was never sent to the pts pharmacy at time of discharge.  Please advise and I will call her back thereafter.

## 2023-07-19 NOTE — Telephone Encounter (Signed)
As per discharge recs, I would recommend lasix 30 bid for now. Please send prescription.   Thanks MJP

## 2023-07-19 NOTE — Telephone Encounter (Signed)
Pt c/o medication issue:  1. Name of Medication:   furosemide (LASIX) 20 MG tablet    2. How are you currently taking this medication (dosage and times per day)? Take 1 tablet (20 mg total) by mouth 2 (two) times daily.   3. Are you having a reaction (difficulty breathing--STAT)? No  4. What is your medication issue? Patient is calling because she was prescribed this medication while in the hospital. Patient was wanting to know if she should continue taking this medication. If so, patient would like [place] to send a refill. Please advise.

## 2023-07-19 NOTE — Telephone Encounter (Signed)
Returned a call back to the pt and endorsed to her that Dr. Rosemary Holms and per discharge recs, she should continue lasix 20 mg po bid.   Refill of lasix 20 mg bid was sent to her confirmed pharmacy of choice.   Pt verbalized understanding and agrees with this plan.  Pt was more than gracious for all the assistance provided.     Off note--discharge recs copied below for lasix dosing reference.  Recommendations at discharge:     Plan to continue furosemide 20 mg po bid, avoid over diuresis due to LV outflow obstruction related to hypertrophic cardiomyopathy.  Metoprolol succinate dose increased to 75 mg po daily. Follow up with Dr Jon Billings in 7 to 10 days.  Follow up with Cardiology as outpatient as scheduled. Patient will need cardiac MRI.  Follow up renal function and electrolytes in 7 days as outpatient.

## 2023-07-24 ENCOUNTER — Inpatient Hospital Stay: Payer: 59 | Admitting: Internal Medicine

## 2023-07-24 ENCOUNTER — Encounter: Payer: Self-pay | Admitting: Internal Medicine

## 2023-07-24 ENCOUNTER — Encounter (HOSPITAL_COMMUNITY): Payer: Self-pay

## 2023-07-24 ENCOUNTER — Other Ambulatory Visit: Payer: Self-pay

## 2023-07-24 ENCOUNTER — Telehealth: Payer: Self-pay | Admitting: Internal Medicine

## 2023-07-24 ENCOUNTER — Other Ambulatory Visit (HOSPITAL_COMMUNITY): Payer: Self-pay

## 2023-07-24 ENCOUNTER — Telehealth: Payer: Self-pay

## 2023-07-24 ENCOUNTER — Ambulatory Visit (INDEPENDENT_AMBULATORY_CARE_PROVIDER_SITE_OTHER): Payer: 59 | Admitting: Internal Medicine

## 2023-07-24 VITALS — BP 113/75 | HR 68 | Temp 97.7°F | Ht 62.0 in | Wt 228.0 lb

## 2023-07-24 DIAGNOSIS — Q248 Other specified congenital malformations of heart: Secondary | ICD-10-CM

## 2023-07-24 DIAGNOSIS — R0683 Snoring: Secondary | ICD-10-CM

## 2023-07-24 DIAGNOSIS — E876 Hypokalemia: Secondary | ICD-10-CM

## 2023-07-24 DIAGNOSIS — E66813 Obesity, class 3: Secondary | ICD-10-CM

## 2023-07-24 DIAGNOSIS — I252 Old myocardial infarction: Secondary | ICD-10-CM

## 2023-07-24 DIAGNOSIS — Z8673 Personal history of transient ischemic attack (TIA), and cerebral infarction without residual deficits: Secondary | ICD-10-CM

## 2023-07-24 DIAGNOSIS — F419 Anxiety disorder, unspecified: Secondary | ICD-10-CM

## 2023-07-24 DIAGNOSIS — I5032 Chronic diastolic (congestive) heart failure: Secondary | ICD-10-CM

## 2023-07-24 DIAGNOSIS — I10 Essential (primary) hypertension: Secondary | ICD-10-CM

## 2023-07-24 DIAGNOSIS — I5033 Acute on chronic diastolic (congestive) heart failure: Secondary | ICD-10-CM

## 2023-07-24 DIAGNOSIS — R053 Chronic cough: Secondary | ICD-10-CM

## 2023-07-24 DIAGNOSIS — I422 Other hypertrophic cardiomyopathy: Secondary | ICD-10-CM

## 2023-07-24 LAB — BASIC METABOLIC PANEL
BUN: 18 mg/dL (ref 6–23)
CO2: 28 meq/L (ref 19–32)
Calcium: 9.5 mg/dL (ref 8.4–10.5)
Chloride: 100 meq/L (ref 96–112)
Creatinine, Ser: 0.87 mg/dL (ref 0.40–1.20)
GFR: 80.07 mL/min (ref >60.00)
Glucose, Bld: 99 mg/dL (ref 70–99)
Potassium: 4.1 meq/L (ref 3.5–5.1)
Sodium: 135 meq/L (ref 135–145)

## 2023-07-24 LAB — POC COVID19 BINAXNOW: SARS Coronavirus 2 Ag: NEGATIVE

## 2023-07-24 LAB — MICROALBUMIN / CREATININE URINE RATIO
Creatinine,U: 73.1 mg/dL
Microalb Creat Ratio: 1 mg/g (ref 0.0–30.0)
Microalb, Ur: <0.7 mg/dL (ref 0.0–1.9)

## 2023-07-24 LAB — BRAIN NATRIURETIC PEPTIDE: Pro B Natriuretic peptide (BNP): 120 pg/mL — ABNORMAL HIGH (ref 0.0–100.0)

## 2023-07-24 MED ORDER — METOPROLOL SUCCINATE ER 25 MG PO TB24
75.0000 mg | ORAL_TABLET | Freq: Every day | ORAL | 3 refills | Status: DC
Start: 2023-07-24 — End: 2023-07-31
  Filled 2023-07-24: qty 90, 30d supply, fill #0

## 2023-07-24 MED ORDER — WEGOVY 0.25 MG/0.5ML ~~LOC~~ SOAJ
0.2500 mg | SUBCUTANEOUS | 11 refills | Status: DC
  Filled 2023-07-24 – 2023-08-07 (×3): qty 2, 28d supply, fill #0

## 2023-07-24 MED ORDER — LORAZEPAM 0.5 MG PO TABS
0.5000 mg | ORAL_TABLET | Freq: Two times a day (BID) | ORAL | 1 refills | Status: DC | PRN
  Filled 2023-07-24: qty 30, 15d supply, fill #0
  Filled 2023-08-18: qty 30, 15d supply, fill #1

## 2023-07-24 MED ORDER — OZEMPIC (0.25 OR 0.5 MG/DOSE) 2 MG/3ML ~~LOC~~ SOPN
0.2500 mg | PEN_INJECTOR | SUBCUTANEOUS | 5 refills | Status: DC
  Filled 2023-07-24: qty 3, 56d supply, fill #0
  Filled 2023-08-03: qty 3, 28d supply, fill #0

## 2023-07-24 MED ORDER — BENZONATATE 100 MG PO CAPS
100.0000 mg | ORAL_CAPSULE | Freq: Three times a day (TID) | ORAL | 0 refills | Status: DC | PRN
  Filled 2023-07-24: qty 20, 7d supply, fill #0

## 2023-07-24 MED ORDER — DEXTROMETHORPHAN-GUAIFENESIN 10-100 MG/5ML PO LIQD
10.0000 mL | ORAL | 1 refills | Status: DC | PRN
  Filled 2023-07-24 – 2023-07-25 (×2): qty 180, 3d supply, fill #0

## 2023-07-24 MED ORDER — DULOXETINE HCL 60 MG PO CPEP
60.0000 mg | ORAL_CAPSULE | Freq: Every day | ORAL | 3 refills | Status: DC
  Filled 2023-08-22: qty 30, 30d supply, fill #0
  Filled 2023-09-21: qty 30, 30d supply, fill #1
  Filled 2023-10-19 – 2023-10-31 (×2): qty 30, 30d supply, fill #2
  Filled 2023-11-27: qty 30, 30d supply, fill #3
  Filled 2023-12-25: qty 30, 30d supply, fill #4

## 2023-07-24 NOTE — Patient Instructions (Addendum)
It was a pleasure seeing you today! Your health and satisfaction are our top priorities.  Glenetta Hew, MD  Your Providers PCP: Lula Olszewski, MD,  [phone]) Referring Provider: Lula Olszewski, MD,  [phone]) Care Team Provider: Elder Negus, MD,  [phone]) Care Team Provider: Lynnell Catalan, MD,  [phone]) Care Team Provider: Drema Dallas, [place],  [phone]) Care Team Provider: Yates Decamp, MD,  [phone]) Care Team Provider: Elder Negus, MD,  [phone]) Care Team Provider: Charlyn Minerva, RN  VISIT SUMMARY:  During your follow-up visit, we discussed your recent hospitalization and ongoing health concerns, including your persistent cough, anxiety, and heart condition. We reviewed your current medications and made plans for further testing and potential treatments.  YOUR PLAN:  -LEFT VENTRICULAR OUTFLOW OBSTRUCTION: This condition involves a blockage in the heart that affects blood flow, leading to fluid buildup and symptoms like coughing. We will continue your current medications, Lasix and Metoprolol, and have scheduled a cardiac MRI for December 4th. We will also check your kidney function and BNP levels. You have been referred to cardiology for further management and potential surgery.  -COUGH: Your persistent cough may be due to fluid overload from your heart condition. We will administer a COVID-19 test and order a chest X-ray to check for fluid in your lungs. Benzonatate has been prescribed to soothe your throat.  -ANXIETY: Your anxiety has increased due to personal and health-related stressors. We discussed the use of benzodiazepines for severe anxiety attacks, and you have been prescribed these for emergency use only. A refill of Cymbalta has been provided, and you have been referred for a sleep study to evaluate for sleep-disordered breathing.  -GENERAL HEALTH MAINTENANCE: We discussed lifestyle modifications,  including exercise and diet, and considered weight management medications like Wegovy or Ozempic. These medications can help manage weight and prevent diabetes, stroke, and heart attack, but there may be insurance challenges. We will attempt to prescribe one of these medications for you.  INSTRUCTIONS:  Please follow up in two weeks for chronic disease monitoring and management. Your cardiac MRI is scheduled for December 4th. Make sure to get your COVID-19 test and chest X-ray as discussed. Continue taking your medications as prescribed, and use the benzodiazepines only for severe anxiety attacks. Attend the sleep study as referred.   NEXT STEPS: [x]  Early Intervention: Schedule sooner appointment, call our on-call services, or go to emergency room if there is any significant Increase in pain or discomfort New or worsening symptoms Sudden or severe changes in your health [x]  Flexible Follow-Up: We recommend a Return in about 2 weeks (around 08/07/2023) for chronic disease monitoring and management  esp anxiety, multiple med problems.. for optimal routine care. This allows for progress monitoring and treatment adjustments. [x]  Preventive Care: Schedule your annual preventive care visit! It's typically covered by insurance and helps identify potential health issues early. [x]  Lab & X-ray Appointments: Incomplete tests scheduled today, or call to schedule. X-rays: [facility] (M-F, 8:30am-noon or 1pm-5pm). [x]  Medical Information Release: Sign a release form at front desk to obtain relevant medical information we don't have.  MAKING THE MOST OF OUR FOCUSED 20 MINUTE APPOINTMENTS: [x]   Clearly state your top concerns at the beginning of the visit to focus our discussion [x]   If you anticipate you will need more time, please inform the front desk during scheduling - we can book multiple appointments in the same week. [x]   If you have transportation problems- use our  convenient video  appointments or ask about transportation support. [x]   We can get down to business faster if you use MyChart to update information before the visit and submit non-urgent questions before your visit. Thank you for taking the time to provide details through MyChart.  Let our nurse know and she can import this information into your encounter documents.  Arrival and Wait Times: [x]   Arriving on time ensures that everyone receives prompt attention. [x]   Early morning (8a) and afternoon (1p) appointments tend to have shortest wait times. [x]   Unfortunately, we cannot delay appointments for late arrivals or hold slots during phone calls.  Getting Answers and Following Up [x]   Simple Questions & Concerns: For quick questions or basic follow-up after your visit, reach [place] at [phone] or MyChart messaging. [x]   Complex Concerns: If your concern is more complex, scheduling an appointment might be best. Discuss this with the staff to find the most suitable option. [x]   Lab & Imaging Results: We'll contact you directly if results are abnormal or you don't use MyChart. Most normal results will be on MyChart within 2-3 business days, with a review message from Dr. Jon Billings. Haven't heard back in 2 weeks? Need results sooner? Contact us at [phone]. [x]   Referrals: Our referral coordinator will manage specialist referrals. The specialist's office should contact you within 2 weeks to schedule an appointment. Call us if you haven't heard from them after 2 weeks.  Staying Connected [x]   MyChart: Activate your MyChart for the fastest way to access results and message [place]. See the last page of this paperwork for instructions on how to activate.  Bring to Your Next Appointment [x]   Medications: Please bring all your medication bottles to your next appointment to ensure we have an accurate record of your prescriptions. [x]   Health Diaries: If you're monitoring any health conditions at home, keeping a diary of  your readings can be very helpful for discussions at your next appointment.  Billing [x]   X-ray & Lab Orders: These are billed by separate companies. Contact the invoicing company directly for questions or concerns. [x]   Visit Charges: Discuss any billing inquiries with our administrative services team.  Your Satisfaction Matters [x]   Share Your Experience: We strive for your satisfaction! If you have any complaints, or preferably compliments, please let Dr. Jon Billings know directly or contact our Practice Administrators, Edwena Felty or [employer], by asking at the front desk.   Reviewing Your Records [x]   Review this early draft of your clinical encounter notes below and the final encounter summary tomorrow on MyChart after its been completed.  All orders placed so far are visible here: Acute on chronic diastolic CHF (congestive heart failure) (HCC) -     Metoprolol Succinate ER; Take 3 tablets (75 mg total) by mouth daily. Take with or immediately following a meal.  Dispense: 270 tablet; Refill: 3 -     Basic metabolic panel -     Brain natriuretic peptide -     Microalbumin / creatinine urine ratio -     DG Chest 2 View; Future  Hypertrophic cardiomyopathy (HCC) -     Metoprolol Succinate ER; Take 3 tablets (75 mg total) by mouth daily. Take with or immediately following a meal.  Dispense: 270 tablet; Refill: 3 -     Basic metabolic panel -     Brain natriuretic peptide -     Microalbumin / creatinine urine ratio -     DG Chest 2  View; Future  Essential hypertension  Chronic heart failure with preserved ejection fraction (HCC) -     Metoprolol Succinate ER; Take 3 tablets (75 mg total) by mouth daily. Take with or immediately following a meal.  Dispense: 270 tablet; Refill: 3 -     Basic metabolic panel -     Brain natriuretic peptide -     Microalbumin / creatinine urine ratio -     DG Chest 2 View; Future  Hypokalemia  Anxiety disorder, unspecified type -     DULoxetine  HCl; Take 1 capsule (60 mg total) by mouth daily. Replaces 30 mg dose  Dispense: 90 capsule; Refill: 3 -     LORazepam; Take 1 tablet (0.5 mg total) by mouth 2 (two) times daily as needed for anxiety.  Dispense: 30 tablet; Refill: 1 -     Ambulatory referral to Sleep Studies  Snoring -     Ambulatory referral to Sleep Studies  Chronic cough -     Benzonatate; Take 1 capsule (100 mg total) by mouth 3 (three) times daily as needed for cough.  Dispense: 20 capsule; Refill: 0 -     Dextromethorphan-guaiFENesin; Take 10 mLs by mouth every 4 (four) hours as needed for cough.  Dispense: 180 mL; Refill: 1 -     POC COVID-19 BinaxNow  Obesity, Class III, BMI 40-49.9 (morbid obesity) (HCC) -     NATFTD; Inject 0.25 mg into the skin once a week.  Dispense: 2 mL; Refill: 11 -     Ozempic (0.25 or 0.5 MG/DOSE); Inject 0.25 mg into the skin once a week.  Dispense: 3 mL; Refill: 5  H/O TIA (transient ischemic attack) and stroke -     MJ.Hock; Inject 0.25 mg into the skin once a week.  Dispense: 2 mL; Refill: 11 -     Ozempic (0.25 or 0.5 MG/DOSE); Inject 0.25 mg into the skin once a week.  Dispense: 3 mL; Refill: 5  History of myocardial infarction -     DUKGUR; Inject 0.25 mg into the skin once a week.  Dispense: 2 mL; Refill: 11 -     Ozempic (0.25 or 0.5 MG/DOSE); Inject 0.25 mg into the skin once a week.  Dispense: 3 mL; Refill: 5  Left ventricular outflow obstruction      S

## 2023-07-24 NOTE — Telephone Encounter (Signed)
Pharmacy Patient Advocate Encounter   Received notification from Pt Calls Messages that prior authorization for Wegovy 0.25mg /0.74ml is required/requested.   Insurance verification completed.   The patient is insured through [facility] .   Per test claim: PA required; PA started via CoverMyMeds. KEY [id number] . Waiting for clinical questions to populate.

## 2023-07-24 NOTE — Assessment & Plan Note (Signed)
Lifestyle modifications, including exercise and diet, were discussed, along with the consideration of weight management medications. Wegovy or Ozempic could aid in weight management and prevent diabetes, stroke, and heart attack, but insurance challenges may arise. An attempt will be made to prescribe Wegovy or Ozempic for weight management for its now FDA approved indications

## 2023-07-24 NOTE — Progress Notes (Addendum)
[facility]: [phone]   -- Medical Office Visit --  Patient:  Lydia Thompson      Age: 47 y.o.       Sex:  female  Date:   07/24/2023 Today's Healthcare Provider: Lula Olszewski, MD  ==========================================================================     Assessment & Plan Acute on chronic diastolic CHF (congestive heart failure) (HCC) Left Ventricular Outflow Obstruction Severe hypertrophic cardiomyopathy is causing obstruction of blood flow from the left ventricle, leading to fluid overload and cough. She is currently managed with Lasix and metoprolol, with surgical intervention under consideration. Using images, discussed how surgery could significantly improve blood flow and reduce symptoms but carries the risks associated with heart surgery. Metoprolol will be continued at 75 mg daily, and a 90-day supply with three refills will be prescribed for convenience and cost. Lasix will also continue at 20 mg twice daily and we will use CXR and BMP and BNP to help guide decision making on dose adjustment- mouth dry appearing today.   A cardiac MRI is scheduled for December 4th, and kidney function and BNP levels will be checked. She has been referred to cardiology for further management and potential surgery and I will defer to them for management.   Reviewed dc summary from hospital with patient explained what's going on with health to minimize readmission Hypertrophic cardiomyopathy (HCC)  Essential hypertension Continue(s) with As of 07/24/2023 Current hypertension medications:       Sig   diltiazem (CARDIZEM CD) 180 MG 24 hr capsule (Taking) Take 1 capsule (180 mg total) by mouth daily.   furosemide (LASIX) 20 MG tablet (Taking) Take 1 tablet (20 mg total) by mouth 2 (two) times daily.   spironolactone (ALDACTONE) 25 MG tablet (Taking) Take 1 tablet (25 mg total) by mouth daily.   metoprolol succinate (TOPROL-XL) 25 MG 24 hr tablet Take 3  tablets (75 mg total) by mouth daily. Take with or immediately following a meal.   diltiazem (DILACOR XR) 180 MG 24 hr capsule (Discontinued) Take 1 capsule (180 mg total) by mouth daily.      Well-controlled today. Chronic heart failure with preserved ejection fraction (HCC)  Hypokalemia Check BMP. Anxiety disorder, unspecified type Anxiety Her anxiety has increased due to personal and health-related stressors, including her husband's stage IV lung cancer, while on a high dose of Cymbalta. The risks and benefits of benzodiazepines for breakthrough anxiety were discussed, including the potential for addiction and dementia. Benzodiazepines have been prescribed for severe anxiety attacks only, and a refill of Cymbalta is provided. She has been referred for a sleep study to evaluate for sleep-disordered breathing. Snoring In considering contributors to her HOCM/LVOT I inquired about this and am concerned about sleep disordered breathing- refer care to sleep medicine. Chronic cough Cough Her persistent cough for one week, unresponsive to over-the-counter medications, likely is due to fluid overload from her heart condition. Increasing Lasix poses a risk of over-diuresis, which could lead to cardiac arrest. A COVID-19 test today was negative  and a chest X-ray ordered to assess for fluid in the lungs. Benzonatate has been prescribed for throat soothing. Obesity, Class III, BMI 40-49.9 (morbid obesity) (HCC) Lifestyle modifications, including exercise and diet, were discussed, along with the consideration of weight management medications. Wegovy or Ozempic could aid in weight management and prevent diabetes, stroke, and heart attack, but insurance challenges may arise. An attempt will be made to prescribe Wegovy or Ozempic for weight management for its now  FDA approved indications H/O TIA (transient ischemic attack) and stroke Will continue(s) with aspirin, and we will try to get [facility] for its new  FDA indication here. History of myocardial infarction Troponin elevated but acute coronary syndrome ruled out- treated as demand ischemia at hospital Left ventricular outflow obstruction      Orders Placed During this Encounter:  Diagnoses and all orders for this visit: Acute on chronic diastolic CHF (congestive heart failure) (HCC) -     metoprolol succinate (TOPROL-XL) 25 MG 24 hr tablet; Take 3 tablets (75 mg total) by mouth daily. Take with or immediately following a meal. -     Basic Metabolic Panel (BMET) -     B Nat Peptide -     Microalbumin / creatinine urine ratio -     DG Chest 2 View; Future Hypertrophic cardiomyopathy (HCC) -     metoprolol succinate (TOPROL-XL) 25 MG 24 hr tablet; Take 3 tablets (75 mg total) by mouth daily. Take with or immediately following a meal. -     Basic Metabolic Panel (BMET) -     B Nat Peptide -     Microalbumin / creatinine urine ratio -     DG Chest 2 View; Future Essential hypertension Chronic heart failure with preserved ejection fraction (HCC) -     metoprolol succinate (TOPROL-XL) 25 MG 24 hr tablet; Take 3 tablets (75 mg total) by mouth daily. Take with or immediately following a meal. -     Basic Metabolic Panel (BMET) -     B Nat Peptide -     Microalbumin / creatinine urine ratio -     DG Chest 2 View; Future Hypokalemia Anxiety disorder, unspecified type -     DULoxetine (CYMBALTA) 60 MG capsule; Take 1 capsule (60 mg total) by mouth daily. Replaces 30 mg dose -     LORazepam (ATIVAN) 0.5 MG tablet; Take 1 tablet (0.5 mg total) by mouth 2 (two) times daily as needed for anxiety. -     Ambulatory referral to Sleep Studies Snoring -     Ambulatory referral to Sleep Studies Chronic cough -     benzonatate (TESSALON PERLES) 100 MG capsule; Take 1 capsule (100 mg total) by mouth 3 (three) times daily as needed for cough. -     dextromethorphan-guaiFENesin (TUSSIN DM) 10-100 MG/5ML liquid; Take 10 mLs by mouth every 4 (four) hours as  needed for cough. -     POC COVID-19 BinaxNow Obesity, Class III, BMI 40-49.9 (morbid obesity) (HCC) -     Semaglutide-Weight Management (WEGOVY) 0.25 MG/0.5ML SOAJ; Inject 0.25 mg into the skin once a week. -     Semaglutide,0.25 or 0.5MG /DOS, (OZEMPIC, 0.25 OR 0.5 MG/DOSE,) 2 MG/3ML SOPN; Inject 0.25 mg into the skin once a week. H/O TIA (transient ischemic attack) and stroke -     Semaglutide-Weight Management (WEGOVY) 0.25 MG/0.5ML SOAJ; Inject 0.25 mg into the skin once a week. -     Semaglutide,0.25 or 0.5MG /DOS, (OZEMPIC, 0.25 OR 0.5 MG/DOSE,) 2 MG/3ML SOPN; Inject 0.25 mg into the skin once a week. History of myocardial infarction -     Semaglutide-Weight Management (WEGOVY) 0.25 MG/0.5ML SOAJ; Inject 0.25 mg into the skin once a week. -     Semaglutide,0.25 or 0.5MG /DOS, (OZEMPIC, 0.25 OR 0.5 MG/DOSE,) 2 MG/3ML SOPN; Inject 0.25 mg into the skin once a week. Left ventricular outflow obstruction  General Health Maintenance Lifestyle modifications, including exercise and diet, were discussed, along with the consideration of  weight management medications. Wegovy or Ozempic could aid in weight management and prevent diabetes, stroke, and heart attack, but insurance challenges may arise. An attempt will be made to prescribe Wegovy or Ozempic for weight management.  Recommended follow-up: Return in about 2 weeks (around 08/07/2023) for chronic disease monitoring and management  esp anxiety, multiple med problems.. Future Appointments  Date Time Provider Department Center  07/24/2023  3:00 PM Charlyn Minerva, RN [facility] None  07/26/2023  1:00 PM [facility]  07/31/2023 11:40 AM Patwardhan, Anabel Bene, MD [facility]  08/07/2023  9:20 AM Lula Olszewski, MD [facility]  09/01/2023  9:20 AM Elder Negus, MD [facility]  09/04/2023  9:30 AM Myles Lipps, RD [facility]  12/08/2023  8:40 AM Lula Olszewski, MD [facility]  Patient  Care Team: Lula Olszewski, MD as PCP - General (Internal Medicine) Elder Negus, MD as PCP - Cardiology (Cardiology) Elder Negus, MD as Consulting Physician (Cardiology) Lynnell Catalan, MD (Gynecology) Drema Dallas, DO as Consulting Physician (Neurology) Yates Decamp, MD as Consulting Physician (Cardiology) Florance, Adrienne Mocha, RN as VBCI Care Management    SUBJECTIVE: 47 y.o. female who has Essential hypertension; Hypertrophic cardiomyopathy (HCC); H/O TIA (transient ischemic attack) and stroke; Chronic heart failure with preserved ejection fraction (HCC); Obesity, Class III, BMI 40-49.9 (morbid obesity) (HCC); Post-menopause atrophic vaginitis; Dizziness; Pulmonary artery hypertension (HCC); Hot flashes, menopausal; Low libido; Nausea; Diastolic dysfunction; Anxiety disorder; Acute exacerbation of congestive heart failure (HCC); Acute respiratory failure with hypoxia (HCC); Acute on chronic diastolic CHF (congestive heart failure) (HCC); Supraventricular tachycardia (HCC); and Hypokalemia on their problem list.  Main reasons for visit/main concerns/chief complaint: Hospitalization Follow-up (Pt here for hosp f.u and c/o of cough for about 1 week, pt has tried OTC alka-seltzer, NyQuil, has not helped. Pt has not taken COVID test. ), Anxiety, Congestive Heart Failure, and Hypertension    AI-Extracted: Discussed the use of AI scribe software for clinical note transcription with the patient, who gave verbal consent to proceed.  History of Present Illness   The patient, with a history of heart problems and hypertension, presented for a follow-up after recent hospitalization. The patient reported a persistent cough for about a week, which was not relieved by over-the-counter medications. The patient denied having taken a COVID-19 test. The patient also expressed concerns about anxiety, which was exacerbated by recent personal stressors, including a spouse's diagnosis  of stage IV lung cancer.  During the hospital stay, the patient was diagnosed with fluid overload and was prescribed Lasix 20mg , which she has been taking as directed. The patient also reported compliance with an increased dose of Metoprolol (75mg /day) to manage blood pressure and ease the heart's workload. However, the patient indicated that the current supply of Metoprolol was running low.  The patient reported that the hospital team had discussed potential heart surgery due to the thickness of the heart muscle, which was causing an obstruction in the left ventricular outflow. The patient expressed a preference for managing the condition with medication before considering surgery.  The patient also reported symptoms of restlessness and difficulty sleeping at night. The patient admitted to snoring, but denied any previous sleep studies or diagnoses of sleep apnea.  The patient has been making efforts to manage her health, including exercising, eating healthier, and reducing salt intake. Despite these efforts, the patient continues to experience symptoms, including coughing and chest pressure, which are attributed to fluid collecting in the  lungs due to the heart condition. The patient expressed a desire for relief from the persistent cough and was prescribed cough medication to soothe the throat.       Note that patient  has a past medical history of Anxiety, Chest pain of uncertain etiology (09/26/2017), CHF (congestive heart failure) (HCC), Hot flashes, menopausal (08/30/2022), Hypertension, and Low libido (08/30/2022).  Problem list overviews that were updated at today's visit: Problem  Acute On Chronic Diastolic Chf (Congestive Heart Failure) (Hcc)  Hypokalemia  Anxiety Disorder   Cymbalta is helping anxiety and was chose to possibly help vasomotor hot flashes as well.   Obesity, Class III, Bmi 40-49.9 (Morbid Obesity) (Hcc)   Not currently on medication 10/06/22   H/O Tia (Transient  Ischemic Attack) and Stroke   Saw Dr. Everlena Cooper 2019 for transient facial droop who stated:  1.  I still want her to have the MRI of brain and MRA of head and neck 2.  She will continue ASA 81mg  daily and atorvastatin 10mg  daily.  If imaging suggests that she had an ischemic event, she will remain on both medications.  If imaging unremarkable, I will have her discontinue atorvastatin but remain on ASA as a precaution Seems mri/mra was never completed   Essential Hypertension    Med reconciliation: Current Outpatient Medications on File Prior to Visit  Medication Sig   aspirin EC 81 MG tablet Take 1 tablet (81 mg total) by mouth daily. Swallow whole.   diltiazem (CARDIZEM CD) 180 MG 24 hr capsule Take 1 capsule (180 mg total) by mouth daily.   estradiol (ESTRACE) 0.1 MG/GM vaginal cream Limit use to minimum amount to control symptoms. (Patient taking differently: Place 1 Applicatorful vaginally as needed (dryness).)   furosemide (LASIX) 20 MG tablet Take 1 tablet (20 mg total) by mouth 2 (two) times daily.   ondansetron (ZOFRAN-ODT) 4 MG disintegrating tablet Take 1 tablet (4 mg total) by mouth every 8 (eight) hours as needed for nausea or vomiting (for nausea from wegovy or other source). (Patient taking differently: Take 4 mg by mouth as needed for nausea or vomiting.)   spironolactone (ALDACTONE) 25 MG tablet Take 1 tablet (25 mg total) by mouth daily.   Fezolinetant (VEOZAH) 45 MG TABS Take 1 tablet (45 mg total) by mouth daily at 6 (six) AM. (Patient not taking: Reported on 07/24/2023)   [DISCONTINUED] diltiazem (DILACOR XR) 180 MG 24 hr capsule Take 1 capsule (180 mg total) by mouth daily.   [DISCONTINUED] isosorbide mononitrate (IMDUR) 30 MG 24 hr tablet Take 1 tablet (30 mg total) by mouth daily.   No current facility-administered medications on file prior to visit.   Medications Discontinued During This Encounter  Medication Reason   DULoxetine (CYMBALTA) 60 MG capsule Reorder    metoprolol succinate (TOPROL-XL) 25 MG 24 hr tablet Reorder     Objective   Physical Exam     07/24/2023    7:48 AM 07/09/2023    9:05 AM 07/09/2023    5:23 AM  Vitals with BMI  Height 5\' 2"     Weight 228 lbs  222 lbs 10 oz  BMI 41.69  40.7  Systolic 113 108 782  Diastolic 75 72 71  Pulse 68 67 63   Wt Readings from Last 10 Encounters:  07/24/23 228 lb (103.4 kg)  07/09/23 222 lb 9.6 oz (101 kg)  04/14/23 225 lb (102.1 kg)  12/06/22 226 lb (102.5 kg)  10/06/22 228 lb 6.4 oz (103.6 kg)  09/19/22  229 lb 3.2 oz (104 kg)  09/01/22 234 lb (106.1 kg)  08/30/22 232 lb (105.2 kg)  06/02/22 236 lb (107 kg)  08/06/21 231 lb 9.6 oz (105.1 kg)   Vital signs reviewed.  Nursing notes reviewed. Weight trend reviewed. Abnormalities and Problem-Specific physical exam findings:  no coughing occurred during visit, with patient sitting upright, normal work of breathing.  General Appearance:  No acute distress appreciable.   Well-groomed, healthy-appearing female.  Well proportioned with no abnormal fat distribution.  Good muscle tone. Pulmonary:  Normal work of breathing at rest, no respiratory distress apparent. SpO2: 99 %  Musculoskeletal: All extremities are intact.  Neurological:  Awake, alert, oriented, and engaged.  No obvious focal neurological deficits or cognitive impairments.  Sensorium seems unclouded.   Speech is clear and coherent with logical content. Psychiatric:  Appropriate mood, pleasant and cooperative demeanor, thoughtful and engaged during the exam  Results   LABS BNP: 468 Troponin: mildly elevated        Results for orders placed or performed in visit on 07/24/23  POC COVID-19 BinaxNow  Result Value Ref Range   SARS Coronavirus 2 Ag Negative Negative    Office Visit on 07/24/2023  Component Date Value   SARS Coronavirus 2 Ag 07/24/2023 Negative   Admission on 07/07/2023, Discharged on 07/09/2023  Component Date Value   Sodium 07/07/2023 137    Potassium  07/07/2023 3.7    Chloride 07/07/2023 102    CO2 07/07/2023 25    Glucose, Bld 07/07/2023 125 (H)    BUN 07/07/2023 20    Creatinine, Ser 07/07/2023 0.90    Calcium 07/07/2023 8.9    GFR, Estimated 07/07/2023 >60    Anion gap 07/07/2023 10    WBC 07/07/2023 17.6 (H)    RBC 07/07/2023 4.89    Hemoglobin 07/07/2023 13.4    HCT 07/07/2023 41.0    MCV 07/07/2023 83.8    MCH 07/07/2023 27.4    MCHC 07/07/2023 32.7    RDW 07/07/2023 13.9    Platelets 07/07/2023 288    nRBC 07/07/2023 0.0    Troponin I (High Sensiti* 07/07/2023 45 (H)    B Natriuretic Peptide 07/07/2023 468.0 (H)    Troponin I (High Sensiti* 07/07/2023 41 (H)    HIV Screen 4th Generatio* 07/07/2023 Non Reactive    Weight 07/08/2023 3,566.16    Height 07/08/2023 62    BP 07/08/2023 109/74    S' Lateral 07/08/2023 2.40    Area-P 1/2 07/08/2023 3.13    Radius 07/08/2023 0.60    MV M vel 07/08/2023 4.99    MV Peak grad 07/08/2023 99.6    Est EF 07/08/2023 70 - 75%    WBC 07/08/2023 13.7 (H)    RBC 07/08/2023 4.76    Hemoglobin 07/08/2023 12.9    HCT 07/08/2023 39.1    MCV 07/08/2023 82.1    MCH 07/08/2023 27.1    MCHC 07/08/2023 33.0    RDW 07/08/2023 13.4    Platelets 07/08/2023 283    nRBC 07/08/2023 0.0    Neutrophils Relative % 07/08/2023 64    Neutro Abs 07/08/2023 8.6 (H)    Lymphocytes Relative 07/08/2023 25    Lymphs Abs 07/08/2023 3.4    Monocytes Relative 07/08/2023 8    Monocytes Absolute 07/08/2023 1.1 (H)    Eosinophils Relative 07/08/2023 3    Eosinophils Absolute 07/08/2023 0.4    Basophils Relative 07/08/2023 0    Basophils Absolute 07/08/2023 0.0    Immature Granulocytes 07/08/2023 0  Abs Immature Granulocytes 07/08/2023 0.06    Magnesium 07/08/2023 2.2    Phosphorus 07/08/2023 4.2    Sodium 07/08/2023 135    Potassium 07/08/2023 3.3 (L)    Chloride 07/08/2023 97 (L)    CO2 07/08/2023 28    Glucose, Bld 07/08/2023 109 (H)    BUN 07/08/2023 20    Creatinine, Ser 07/08/2023 0.84     Calcium 07/08/2023 9.3    Total Protein 07/08/2023 7.7    Albumin 07/08/2023 3.6    AST 07/08/2023 14 (L)    ALT 07/08/2023 16    Alkaline Phosphatase 07/08/2023 65    Total Bilirubin 07/08/2023 1.2 (H)    GFR, Estimated 07/08/2023 >60    Anion gap 07/08/2023 10    Sodium 07/09/2023 135    Potassium 07/09/2023 4.2    Chloride 07/09/2023 101    CO2 07/09/2023 27    Glucose, Bld 07/09/2023 111 (H)    BUN 07/09/2023 17    Creatinine, Ser 07/09/2023 0.89    Calcium 07/09/2023 9.2    GFR, Estimated 07/09/2023 >60    Anion gap 07/09/2023 7    Magnesium 07/09/2023 2.7 (H)   Scanned Document on 05/04/2023  Component Date Value   HM Mammogram 05/03/2023 0-4 Bi-Rad   Office Visit on 12/06/2022  Component Date Value   Cholesterol 12/06/2022 182    Triglycerides 12/06/2022 95.0    HDL 12/06/2022 34.80 (L)    VLDL 12/06/2022 19.0    LDL Cholesterol 12/06/2022 128 (H)    Total CHOL/HDL Ratio 12/06/2022 5    NonHDL 12/06/2022 146.81    TSH 12/06/2022 2.38    Sodium 12/06/2022 135    Potassium 12/06/2022 4.0    Chloride 12/06/2022 98    CO2 12/06/2022 29    Glucose, Bld 12/06/2022 101 (H)    BUN 12/06/2022 17    Creatinine, Ser 12/06/2022 0.87    Total Bilirubin 12/06/2022 0.7    Alkaline Phosphatase 12/06/2022 70    AST 12/06/2022 16    ALT 12/06/2022 22    Total Protein 12/06/2022 7.8    Albumin 12/06/2022 4.4    GFR 12/06/2022 80.42    Calcium 12/06/2022 9.4    WBC 12/06/2022 10.5    RBC 12/06/2022 5.08    Hemoglobin 12/06/2022 14.1    HCT 12/06/2022 41.9    MCV 12/06/2022 82.5    MCHC 12/06/2022 33.6    RDW 12/06/2022 14.6    Platelets 12/06/2022 315.0    Neutrophils Relative % 12/06/2022 66.0    Lymphocytes Relative 12/06/2022 24.0    Monocytes Relative 12/06/2022 6.6    Eosinophils Relative 12/06/2022 2.8    Basophils Relative 12/06/2022 0.6    Neutro Abs 12/06/2022 6.9    Lymphs Abs 12/06/2022 2.5    Monocytes Absolute 12/06/2022 0.7    Eosinophils Absolute  12/06/2022 0.3    Basophils Absolute 12/06/2022 0.1    Hgb A1c MFr Bld 12/06/2022 5.5    Magnesium 12/06/2022 2.2    No image results found.   ECHOCARDIOGRAM COMPLETE  Result Date: 07/08/2023    ECHOCARDIOGRAM REPORT   Patient Name:   MAVERICK LEBRETON Date of Exam: 07/08/2023 Medical Rec #:  [id number]     Height:       62.0 in Accession #:    [id number]    Weight:       222.9 lb Date of Birth:  12-04-1975    BSA:          2.002 m Patient Age:  47 years      BP:           109/74 mmHg Patient Gender: F             HR:           66 bpm. Exam Location:  Inpatient Procedure: 2D Echo, Color Doppler, Cardiac Doppler and Intracardiac            Opacification Agent Indications:    R06.9 DOE  History:        Patient has prior history of Echocardiogram examinations, most                 recent 05/29/2022. CHF, Pulmonary HTN; Risk Factors:Hypertension.  Sonographer:    Irving Burton Senior RDCS Referring Phys: [id number] PROSPER M AMPONSAH IMPRESSIONS  1. 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 Left ventricular ejection fraction, by estimation, is 70 to 75%. The left ventricle has hyperdynamic  function. The left ventricle has no regional wall motion abnormalities. There is severe asymmetric left ventricular hypertrophy of the septal segment (basal septum measures 20 mm). Hypertrophied papillary muscles. Left ventricular diastolic parameters are indeterminate.  2. Right ventricular systolic function is normal. The right ventricular size is normal. Tricuspid regurgitation signal is inadequate for assessing PA pressure.  3. Left atrial size was severely dilated.  4. Systolic anterior motion of the mitral valve in the setting of LVOT obstruction. The mitral valve is grossly normal. Moderate to severe mitral valve regurgitation. No evidence of mitral stenosis.  5. The aortic valve is grossly normal. Aortic valve regurgitation  is not visualized. No aortic stenosis is present.  6. The inferior vena cava is normal in size with greater than 50% respiratory variability, suggesting right atrial pressure of 3 mmHg. 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 Left ventricular ejection fraction, by estimation, is 70 to 75%. The left ventricle has  hyperdynamic function. The left ventricle has no regional wall motion abnormalities. Definity contrast agent was given IV to delineate the left ventricular endocardial borders. The left ventricular internal cavity size was normal in size. There is severe asymmetric left ventricular hypertrophy of the septal segment. Left ventricular diastolic parameters are indeterminate. Right Ventricle: The right ventricular size is normal. Right vetricular wall thickness was not well visualized. Right ventricular systolic function is normal. Tricuspid regurgitation signal is inadequate for assessing PA pressure. Left Atrium: Left atrial size was severely dilated. Right Atrium: Right atrial size was normal in size. Pericardium: There is no evidence of pericardial effusion. Mitral Valve: Systolic anterior motion of the mitral valve in the setting of LVOT obstruction. The mitral valve is grossly normal. Moderate to severe mitral valve regurgitation, with eccentric posteriorly directed jet. No evidence of mitral valve stenosis. Tricuspid Valve: The tricuspid valve is normal in structure. Tricuspid valve regurgitation is trivial. No evidence of tricuspid stenosis. Aortic Valve: The aortic valve is grossly normal. Aortic valve regurgitation is not visualized. No aortic stenosis is present. Pulmonic Valve: The pulmonic valve was normal in structure. Pulmonic valve regurgitation is trivial. No evidence of pulmonic stenosis. Aorta: The aortic root and ascending aorta are structurally normal, with  no evidence of dilitation. Venous: The inferior vena cava is normal in size with greater than 50% respiratory variability, suggesting right atrial pressure of 3 mmHg. IAS/Shunts:  The interatrial septum was not well visualized.  LEFT VENTRICLE PLAX 2D LVIDd:         3.80 cm   Diastology LVIDs:         2.40 cm   LV e' medial:    7.40 cm/s LV PW:         1.20 cm   LV E/e' medial:  13.0 LV IVS:        2.00 cm   LV e' lateral:   10.90 cm/s LVOT diam:     2.00 cm   LV E/e' lateral: 8.8 LVOT Area:     3.14 cm  RIGHT VENTRICLE RV S prime:     13.90 cm/s TAPSE (M-mode): 1.8 cm LEFT ATRIUM              Index        RIGHT ATRIUM           Index LA diam:        5.00 cm  2.50 cm/m   RA Area:     15.80 cm LA Vol (A2C):   87.7 ml  43.81 ml/m  RA Volume:   41.00 ml  20.48 ml/m LA Vol (A4C):   130.0 ml 64.94 ml/m LA Biplane Vol: 112.0 ml 55.95 ml/m   AORTA Ao Root diam: 2.80 cm Ao Asc diam:  3.20 cm MITRAL VALVE MV Area (PHT): 3.13 cm       SHUNTS MV Decel Time: 242 msec       Systemic Diam: 2.00 cm MR Peak grad:    99.6 mmHg MR Mean grad:    77.0 mmHg MR Vmax:         499.00 cm/s MR Vmean:        427.0 cm/s MR PISA:         2.26 cm MR PISA Eff ROA: 17 mm MR PISA Radius:  0.60 cm MV E velocity: 95.90 cm/s MV A velocity: 65.50 cm/s MV E/A ratio:  1.46 Weston Brass MD Electronically signed by Weston Brass MD Signature Date/Time: 07/08/2023/4:16:34 PM    Final    DG Chest 2 View  Result Date: 07/07/2023 CLINICAL DATA:  Chest pain that started on Sunday.  History of CHF EXAM: CHEST - 2 VIEW COMPARISON:  02/19/2020 FINDINGS: Diffuse interstitial opacity with Kerley lines, fissure thickening, airway cuffing. Symmetric hazy airspace disease. Borderline heart size. Upper mediastinal contours are stable. IMPRESSION: Pulmonary edema. Electronically Signed   By: Tiburcio Pea M.D.   On: 07/07/2023 07:06    ECHOCARDIOGRAM COMPLETE DG Chest 2 View  Result Date: 07/07/2023 CLINICAL DATA:  Chest pain that started on  Sunday.  History of CHF EXAM: CHEST - 2 VIEW COMPARISON:  02/19/2020 FINDINGS: Diffuse interstitial opacity with Kerley lines, fissure thickening, airway cuffing. Symmetric hazy airspace disease. Borderline heart size. Upper mediastinal contours are stable. IMPRESSION: Pulmonary edema. Electronically Signed   By: Tiburcio Pea M.D.   On: 07/07/2023 07:06         Additional Info: This encounter employed real-time, collaborative documentation. The patient actively reviewed and updated their medical record on a shared screen, ensuring transparency and facilitating joint problem-solving for the problem list, overview, and plan. This approach promotes accurate, informed care. The treatment plan was discussed and reviewed in detail, including medication safety, potential side effects, and all patient questions. We confirmed understanding and comfort with the plan. Follow-up instructions were established, including contacting the office for any concerns, returning if symptoms worsen, persist, or new symptoms develop,  and precautions for potential emergency department visits.

## 2023-07-24 NOTE — Assessment & Plan Note (Signed)
Anxiety Her anxiety has increased due to personal and health-related stressors, including her husband's stage IV lung cancer, while on a high dose of Cymbalta. The risks and benefits of benzodiazepines for breakthrough anxiety were discussed, including the potential for addiction and dementia. Benzodiazepines have been prescribed for severe anxiety attacks only, and a refill of Cymbalta is provided. She has been referred for a sleep study to evaluate for sleep-disordered breathing.

## 2023-07-24 NOTE — Assessment & Plan Note (Signed)
Check BMP .

## 2023-07-24 NOTE — Assessment & Plan Note (Addendum)
Continue(s) with As of 07/24/2023 Current hypertension medications:       Sig   diltiazem (CARDIZEM CD) 180 MG 24 hr capsule (Taking) Take 1 capsule (180 mg total) by mouth daily.   furosemide (LASIX) 20 MG tablet (Taking) Take 1 tablet (20 mg total) by mouth 2 (two) times daily.   spironolactone (ALDACTONE) 25 MG tablet (Taking) Take 1 tablet (25 mg total) by mouth daily.   metoprolol succinate (TOPROL-XL) 25 MG 24 hr tablet Take 3 tablets (75 mg total) by mouth daily. Take with or immediately following a meal.   diltiazem (DILACOR XR) 180 MG 24 hr capsule (Discontinued) Take 1 capsule (180 mg total) by mouth daily.      Well-controlled today.

## 2023-07-24 NOTE — Assessment & Plan Note (Signed)
Left Ventricular Outflow Obstruction Severe hypertrophic cardiomyopathy is causing obstruction of blood flow from the left ventricle, leading to fluid overload and cough. She is currently managed with Lasix and metoprolol, with surgical intervention under consideration. Using images, discussed how surgery could significantly improve blood flow and reduce symptoms but carries the risks associated with heart surgery. Metoprolol will be continued at 75 mg daily, and a 90-day supply with three refills will be prescribed for convenience and cost. Lasix will also continue at 20 mg twice daily and we will use CXR and BMP and BNP to help guide decision making on dose adjustment- mouth dry appearing today.   A cardiac MRI is scheduled for December 4th, and kidney function and BNP levels will be checked. She has been referred to cardiology for further management and potential surgery and I will defer to them for management.   Reviewed dc summary from hospital with patient explained what's going on with health to minimize readmission

## 2023-07-24 NOTE — Patient Instructions (Signed)
Visit Information  Thank you for taking time to visit with me today. You are doing a good job especially given all that you are trying to manage right now so remember to give yourself " a little more grace." Please don't hesitate to contact me if I can be of assistance to you before our next scheduled telephone appointment.  Our next appointment is by telephone on 07/31/23 at 3pm  Following is a copy of your care plan:   Goals Addressed             This Visit's Progress    TOC Care Plan       /Current Barriers:  Knowledge Deficits related to plan of care for management of CHF   RNCM Clinical Goal(s):  Patient will work with the Care Management team over the next 30 days to address Transition of Care Barriers: CHF mgmt verbalize understanding of plan for management of CHF as evidenced by adherence to plan of care take all medications exactly as prescribed and will call provider for medication related questions as evidenced by med adherence/compliance demonstrate Improved health management independence as evidenced by no exacerbation of sxs or readmission within the next 30 days  through collaboration with RN Care manager, provider, and care team.   Interventions: Evaluation of current treatment plan related to  self management and patient's adherence to plan as established by provider   Heart Failure Interventions:  (Status:  Goal on track:  Yes.) Short Term Goal Continued HF education and reinforcement to pt-she voices that she feels like she did well managing diet and eating over the holidays Reviewed wgt and BP log with pt-she continues to monitor wgt and BP daily,- wgt today 228 lbs, BP- 113/75 Continued with HF diet education- importance of making "healthier food options", discussed reading food labels when grocery shopping, holiday eating and sticking to diet Discussed with pt recent provider appt-she saw PCP today-had some meds added to help manage sxs-including cough med since  she has been complaining of some "throat hurting" for the past few days- took COVID-test and it was negative, she states she has been referred to sleep study for further eval for her snoring Support given as pt discusses that spouse has stage 4 lung CA and has undergone txs- pt is active and goes to therapy later this week Reviewed med mgmt with pt-she has been ordered [employer] or Ozempic Reviewed with pt apps to help her manage her health-she has been using the "Lose It"App  Patient Goals/Self-Care Activities: Participate in Transition of Care Program/Attend TOC scheduled calls Take all medications as prescribed Attend all scheduled provider appointments Call provider office for new concerns or questions   call office if I gain more than 2 pounds in one day or 5 pounds in one week track weight in diary watch for swelling in feet, ankles and legs every day develop a rescue plan track symptoms and what helps feel better or worse Take wgt and BP log to all MD appts for provider to review Patient will complete sleep study  Follow Up Plan:  Telephone follow up appointment with care management team member scheduled for:  07/31/23- 3pm The patient has been provided with contact information for the care management team and has been advised to call with any health related questions or concerns.          Patient verbalizes understanding of instructions and care plan provided today and agrees to view in MyChart. Active MyChart status and patient understanding  of how to access instructions and care plan via MyChart confirmed with patient.     The patient has been provided with contact information for the care management team and has been advised to call with any health related questions or concerns.   Please call the care guide team at [phone] if you need to cancel or reschedule your appointment.   Please call the [place] National Suicide Prevention Lifeline: (364)450-2673 or TTY: (226) 058-4086  TTY (639)211-3414) to talk to a trained counselor call 1-800-273-TALK (toll free, 24 hour hotline) if you are experiencing a Mental Health or Behavioral Health Crisis or need someone to talk to.  Antionette Fairy, RN,BSN,CCM RN Care Manager Transitions of Care  [place]/Population [facility]  Direct Phone: [phone] Toll Free: [phone] Fax: [phone]

## 2023-07-24 NOTE — Patient Outreach (Signed)
Care Management  Transitions of Care Program Transitions of Care Post-discharge week 3   07/24/2023 Name: Lydia Thompson MRN: [id number] DOB: [birth year]  Subjective: Lydia Thompson is a 47 y.o. year old female who is a primary care patient of Lula Olszewski, MD. The Care Management team  Engaged with patient by telephone to assess and address transitions of care needs.   Consent to Services:  Patient was given information about care management services, agreed to services, and gave verbal consent to participate.   Assessment:   Patient voices that she is doing well. She went to PCP earlier today and discussed a lot of things with her. Reviewed and discussed updates and provided support and education to patient. She remains very eager and motivated to make changes to improve her overall health. Denies any RN CM needs or concerns at this time.        SDOH Interventions    Flowsheet Row Telephone from 07/10/2023 in [place] POPULATION HEALTH DEPARTMENT  SDOH Interventions   Food Insecurity Interventions Intervention Not Indicated  Housing Interventions Intervention Not Indicated  Transportation Interventions Intervention Not Indicated  Utilities Interventions Intervention Not Indicated        Goals Addressed             This Visit's Progress    TOC Care Plan       /Current Barriers:  Knowledge Deficits related to plan of care for management of CHF   RNCM Clinical Goal(s):  Patient will work with the Care Management team over the next 30 days to address Transition of Care Barriers: CHF mgmt verbalize understanding of plan for management of CHF as evidenced by adherence to plan of care take all medications exactly as prescribed and will call provider for medication related questions as evidenced by med adherence/compliance demonstrate Improved health management independence as evidenced by no exacerbation of sxs or readmission within the next 30 days  through  collaboration with RN Care manager, provider, and care team.   Interventions: Evaluation of current treatment plan related to  self management and patient's adherence to plan as established by provider   Heart Failure Interventions:  (Status:  Goal on track:  Yes.) Short Term Goal Continued HF education and reinforcement to pt-she voices that she feels like she did well managing diet and eating over the holidays Reviewed wgt and BP log with pt-she continues to monitor wgt and BP daily,- wgt today 228 lbs, BP- 113/75 Continued with HF diet education- importance of making "healthier food options", discussed reading food labels when grocery shopping, holiday eating and sticking to diet Discussed with pt recent provider appt-she saw PCP today-had some meds added to help manage sxs-including cough med since she has been complaining of some "throat hurting" for the past few days- took COVID-test and it was negative, she states she has been referred to sleep study for further eval for her snoring Support given as pt discusses that spouse has stage 4 lung CA and has undergone txs- pt is active and goes to therapy later this week Reviewed med mgmt with pt-she has been ordered [employer] or Ozempic Reviewed with pt apps to help her manage her health-she has been using the "Lose It"App  Patient Goals/Self-Care Activities: Participate in Transition of Care Program/Attend TOC scheduled calls Take all medications as prescribed Attend all scheduled provider appointments Call provider office for new concerns or questions   call office if I gain more than 2 pounds in one day or  5 pounds in one week track weight in diary watch for swelling in feet, ankles and legs every day develop a rescue plan track symptoms and what helps feel better or worse Take wgt and BP log to all MD appts for provider to review Patient will complete sleep study  Follow Up Plan:  Telephone follow up appointment with care management  team member scheduled for:  07/31/23- 3pm The patient has been provided with contact information for the care management team and has been advised to call with any health related questions or concerns.          Plan: The patient has been provided with contact information for the care management team and has been advised to call with any health related questions or concerns.  Follow up appt scheduled with pt for 07/31/23-3pm.   Antionette Fairy, RN,BSN,CCM RN Care Manager Transitions of Care  [place]/Population Health  Direct Phone: [phone] Toll Free: [phone] Fax: [phone]

## 2023-07-24 NOTE — Telephone Encounter (Signed)
Pt states both  Semaglutide-Weight Management (WEGOVY) 0.25 MG/0.5ML SOAJ  And  Semaglutide,0.25 or 0.5MG /DOS, (OZEMPIC, 0.25 OR 0.5 MG/DOSE,) 2 MG/3ML SOPN  Needs PA. Please advise.

## 2023-07-24 NOTE — Assessment & Plan Note (Signed)
Will continue(s) with aspirin, and we will try to get [facility] for its new FDA indication here.

## 2023-07-25 ENCOUNTER — Other Ambulatory Visit: Payer: Self-pay

## 2023-07-25 ENCOUNTER — Encounter: Payer: Self-pay | Admitting: Internal Medicine

## 2023-07-25 DIAGNOSIS — Z8673 Personal history of transient ischemic attack (TIA), and cerebral infarction without residual deficits: Secondary | ICD-10-CM

## 2023-07-25 DIAGNOSIS — I252 Old myocardial infarction: Secondary | ICD-10-CM

## 2023-07-25 MED ORDER — MOUNJARO 2.5 MG/0.5ML ~~LOC~~ SOAJ
2.5000 mg | SUBCUTANEOUS | 2 refills | Status: DC
  Filled 2023-07-25 – 2023-08-03 (×3): qty 2, 28d supply, fill #0

## 2023-07-25 NOTE — Telephone Encounter (Signed)
Clinical questions answered and PA submitted for Wegovy,   Ozempic is only for patients with a diagnosis of T2DM.

## 2023-07-26 ENCOUNTER — Ambulatory Visit
Admission: RE | Admit: 2023-07-26 | Discharge: 2023-07-26 | Disposition: A | Discharge status: Home / Self Care | Payer: 59 | Source: Ambulatory Visit | Attending: Cardiology | Admitting: Cardiology

## 2023-07-26 ENCOUNTER — Other Ambulatory Visit: Payer: Self-pay | Admitting: Cardiology

## 2023-07-26 ENCOUNTER — Other Ambulatory Visit: Payer: Self-pay

## 2023-07-26 DIAGNOSIS — I422 Other hypertrophic cardiomyopathy: Secondary | ICD-10-CM

## 2023-07-26 MED ORDER — GADOBUTROL 1 MMOL/ML IV SOLN
13.0000 mL | Freq: Once | INTRAVENOUS | Status: AC | PRN
  Administered 2023-07-26: 13 mL via INTRAVENOUS

## 2023-07-26 MED ORDER — GADOBENATE DIMEGLUMINE 529 MG/ML IV SOLN: 13.0000 mL | Freq: Once | INTRAVENOUS | Status: DC | PRN

## 2023-07-26 NOTE — Telephone Encounter (Signed)
Received notice that a prior authorization for Lydia Thompson is requested.   PA not submitted due to patient does not have a diagnosis of T2DM. Lydia Thompson is only indicated for patients with T2DM.

## 2023-07-27 ENCOUNTER — Other Ambulatory Visit: Payer: Self-pay

## 2023-07-27 ENCOUNTER — Other Ambulatory Visit (HOSPITAL_COMMUNITY): Payer: Self-pay

## 2023-07-27 ENCOUNTER — Telehealth: Payer: Self-pay

## 2023-07-27 NOTE — Telephone Encounter (Signed)
Pharmacy Patient Advocate Encounter   Received notification from Pt Calls Messages that prior authorization for Ozempic 2mg /63ml is required/requested.   Insurance verification completed.   The patient is insured through [facility] .   Per test claim: PA required; PA started via CoverMyMeds. KEY [id number] . Waiting for clinical questions to populate.

## 2023-07-27 NOTE — Telephone Encounter (Signed)
Pharmacy Patient Advocate Encounter  Received notification from [facility] that Prior Authorization for [facility] 0.25mg /0.10ml has been DENIED.  See denial reason below. No denial letter attached in CMM. Will attach denial letter to Media tab once received.   PA #/Case ID/Reference #: [id number]    Ozempic, Victoza, and Greggory Keen are for the diagnosis of T2DM.

## 2023-07-27 NOTE — Telephone Encounter (Signed)
 Clinical questions answered and PA submitted

## 2023-07-28 NOTE — Telephone Encounter (Signed)
Pharmacy Patient Advocate Encounter  Received notification from [facility] that Prior Authorization for Ozempic 2mg /46ml has been DENIED.  See denial reason below. No denial letter attached in CMM. Will attach denial letter to Media tab once received.   PA #/Case ID/Reference #: Val Eagle [id number]

## 2023-07-28 NOTE — Telephone Encounter (Signed)
Appeal has been submitted for [facility] at Dr. Kandra Nicolas request. Will advise when response is received. Please be advised that most companies may take 30 days to make a decision. Appeal letter, including requested verbiage from Dr. Jon Billings, and all pertinent information has been faxed to [phone] on 07/28/2023 @11 :20 am.  Original reason for denial:  drug not covered/plan exclusion-your request for coverage was denied because your prescription benefit plan does not cover the requested medication.  Dellie Burns, PharmD Clinical Pharmacist  [place]  Direct Dial: [phone]

## 2023-07-28 NOTE — Progress Notes (Unsigned)
  Cardiology Office Note:  .   Date:  07/28/2023  ID:  Lydia EMPEY, DOB 1976/04/21, MRN [id number] PCP: Lula Olszewski, MD  [facility] Providers Cardiologist:  Truett Mainland, MD PCP: Lula Olszewski, MD  No chief complaint on file.     History of Present Illness: .    Lydia Thompson is a 47 y.o. female with ***  There were no vitals filed for this visit.   ROS: *** ROS   Studies Reviewed: .        *** Independently interpreted ***/202***: Chol ***, TG ***, HDL ***, LDL *** HbA1C ***% Hb *** Cr *** ***  Cardiac MRI 07/26/2023: 1. Normal LV size and systolic function, LVEF = 57%. 2. Severe asymmetric LV basal-septal wall thickness, measuring upto 1.8 cm.  3. 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 4. There is no late gadolinium enhancement in the left ventricular myocardium. 5. Normal RV function. 6. 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  Risk Assessment/Calculations:   {Does this patient have ATRIAL FIBRILLATION?:[id number]}     Physical Exam:   Physical Exam   VISIT DIAGNOSES: No diagnosis found.   ASSESSMENT AND PLAN: .    KATHELINE Thompson is a 47 y.o. female with ***  {Are you ordering a CV Procedure (e.g. stress test, cath, DCCV, TEE, etc)?   Press F2        :[id number]}    No orders of the defined types were placed in this encounter.    F/u in ***  Signed, Elder Negus, MD

## 2023-07-31 ENCOUNTER — Ambulatory Visit: Payer: 59 | Attending: Cardiology

## 2023-07-31 ENCOUNTER — Encounter: Payer: Self-pay | Admitting: Cardiology

## 2023-07-31 ENCOUNTER — Ambulatory Visit: Payer: 59 | Attending: Cardiology | Admitting: Cardiology

## 2023-07-31 ENCOUNTER — Other Ambulatory Visit: Payer: Self-pay

## 2023-07-31 VITALS — BP 109/76 | HR 72 | Resp 16 | Ht 62.0 in | Wt 227.0 lb

## 2023-07-31 DIAGNOSIS — R002 Palpitations: Secondary | ICD-10-CM

## 2023-07-31 DIAGNOSIS — I422 Other hypertrophic cardiomyopathy: Secondary | ICD-10-CM | POA: Diagnosis not present

## 2023-07-31 MED ORDER — DILTIAZEM HCL ER COATED BEADS 120 MG PO CP24
120.0000 mg | ORAL_CAPSULE | Freq: Every day | ORAL | 3 refills | Status: DC
  Filled 2023-07-31: qty 30, 30d supply, fill #0
  Filled 2023-08-19 – 2023-08-23 (×2): qty 30, 30d supply, fill #1
  Filled 2023-09-22: qty 30, 30d supply, fill #2

## 2023-07-31 MED ORDER — METOPROLOL SUCCINATE ER 100 MG PO TB24
100.0000 mg | ORAL_TABLET | Freq: Every day | ORAL | 3 refills | Status: DC
  Filled 2023-07-31: qty 30, 30d supply, fill #0
  Filled 2023-08-19 – 2023-08-23 (×2): qty 30, 30d supply, fill #1
  Filled 2023-09-21: qty 30, 30d supply, fill #2

## 2023-07-31 MED ORDER — SPIRONOLACTONE 25 MG PO TABS
25.0000 mg | ORAL_TABLET | Freq: Every day | ORAL | 3 refills | Status: DC
  Filled 2023-07-31: qty 90, 90d supply, fill #0
  Filled 2023-08-19: qty 30, 30d supply, fill #0
  Filled 2023-09-21: qty 30, 30d supply, fill #1

## 2023-07-31 NOTE — Telephone Encounter (Signed)
Ozempic is approved to reduce the risk of major adverse cardiovascular events in adults with type 2 diabetes and established cardiovascular disease. This patient does not have documented diabetes, and an appeal has already been submitted for [facility]. Therefore, no appeal will be submitted for Ozempic as the patient does not meet the qualifications.  We will follow up once we have received a decision on the [facility] appeal.  Dellie Burns, PharmD Clinical Pharmacist  [place]  Direct Dial: [phone]

## 2023-07-31 NOTE — Progress Notes (Unsigned)
Enrolled for [employer] to mail a ZIO XT long term holter monitor to the patients address on file.  

## 2023-07-31 NOTE — Patient Outreach (Signed)
Care Management  Transitions of Care Program Transitions of Care Post-discharge week 4   07/31/2023 Name: Lydia Thompson MRN: [id number] DOB: December 15, 1975  Subjective: Lydia Thompson is a 47 y.o. year old female who is a primary care patient of Lydia Olszewski, MD. The Care Management team  Engaged with patient by telephone to assess and address transitions of care needs.   Consent to Services:  Patient was given information about care management services, agreed to services, and gave verbal consent to participate.   Assessment:   Patient doing well-has had a good week. She saw cardiologist today and had some med changes.Wgt stable.She continues to work. Patient remains highly motivated to  make lifestyle changes to better her health. Continued education & support given. No RN CM needs or concerns at this time.         SDOH Interventions    Flowsheet Row Telephone from 07/10/2023 in [place] POPULATION HEALTH DEPARTMENT  SDOH Interventions   Food Insecurity Interventions Intervention Not Indicated  Housing Interventions Intervention Not Indicated  Transportation Interventions Intervention Not Indicated  Utilities Interventions Intervention Not Indicated        Goals Addressed             This Visit's Progress    TOC Care Plan       /Current Barriers:  Knowledge Deficits related to plan of care for management of CHF   RNCM Clinical Goal(s):  Patient will work with the Care Management team over the next 30 days to address Transition of Care Barriers: CHF mgmt verbalize understanding of plan for management of CHF as evidenced by adherence to plan of care take all medications exactly as prescribed and will call provider for medication related questions as evidenced by med adherence/compliance demonstrate Improved health management independence as evidenced by no exacerbation of sxs or readmission within the next 30 days  through collaboration with RN Care manager, provider,  and care team.   Interventions: Evaluation of current treatment plan related to  self management and patient's adherence to plan as established by provider   Heart Failure Interventions:  (Status:  Goal on track:  Yes.) Short Term Goal Continued HF education and reinforcement to pt-pt states cardiologist today told her she "should be drinking more water 2-3L to decrease strain/work on heart" Reviewed wgt and BP log with pt-she continues to monitor wgt and BP daily,- wgt today 223lbs at home and 227lbs at MD office, denies any swelling Reinforced HF diet education- pt confirm she has been reading food labels, cut out snacking and making better food choices-wants to start meal prepping Discussed with pt recent provider appt-she saw cardiologist today and has been referred to hypertrophic cardiomyopathy specialist-Dr. Freda Thompson to possibly be considered to start med to help "shrink heart" Assessed pt's coping with her illness as well as spouse's-she is in better spirits today-saw therapist last week-states spouse doing & feeling better which makes her feel better Reviewed med mgmt with pt-she has been ordered [facility] or Ozempic-denied by insurance-MD trying to getting approval overturned, pt was able to find new insurance for next year-switching to [employer] they will provide coverage for wgt loss meds Reviewed with pt referrals to sleep study for snoring-per chart review-sent to [facility] and referral "ready for initial scheduling"-pt requested contact info and was given info, she has appt with nutritionist referral on 09/03/22  Patient Goals/Self-Care Activities: Participate in Transition of Care Program/Attend [facility] scheduled calls Take all medications as prescribed Attend  all scheduled provider appointments Call provider office for new concerns or questions   call office if I gain more than 2 pounds in one day or 5 pounds in one week track weight in diary watch for  swelling in feet, ankles and legs every day develop a rescue plan track symptoms and what helps feel better or worse Take wgt and BP log to all MD appts for provider to review Patient will complete sleep study Increase fluid intake per cardiologist recommendations  Follow Up Plan:  Telephone follow up appointment with care management team member scheduled for:  08/07/23-3pm The patient has been provided with contact information for the care management team and has been advised to call with any health related questions or concerns.          Plan: The patient has been provided with contact information for the care management team and has been advised to call with any health related questions or concerns.  Follow up appt scheduled with pt on 08/07/23-3pm.   Lydia Fairy, RN,BSN,CCM RN Care Manager Transitions of Care  [place]/Population Health  Direct Phone: [phone] Toll Free: [phone] Fax: [phone]

## 2023-07-31 NOTE — Patient Instructions (Signed)
Visit Information  Thank you for taking time to visit with me today. Keep up the motivation and changes you are making to improve your health! Please don't hesitate to contact me if I can be of assistance to you before our next scheduled telephone appointment.  Our next appointment is by telephone on 08/07/23 at 3pm  Following is a copy of your care plan:   Goals Addressed             This Visit's Progress    TOC Care Plan       /Current Barriers:  Knowledge Deficits related to plan of care for management of CHF   RNCM Clinical Goal(s):  Patient will work with the Care Management team over the next 30 days to address Transition of Care Barriers: CHF mgmt verbalize understanding of plan for management of CHF as evidenced by adherence to plan of care take all medications exactly as prescribed and will call provider for medication related questions as evidenced by med adherence/compliance demonstrate Improved health management independence as evidenced by no exacerbation of sxs or readmission within the next 30 days  through collaboration with RN Care manager, provider, and care team.   Interventions: Evaluation of current treatment plan related to  self management and patient's adherence to plan as established by provider   Heart Failure Interventions:  (Status:  Goal on track:  Yes.) Short Term Goal Continued HF education and reinforcement to pt-pt states cardiologist today told her she "should be drinking more water 2-3L to decrease strain/work on heart" Reviewed wgt and BP log with pt-she continues to monitor wgt and BP daily,- wgt today 223lbs at home and 227lbs at MD office,  Reinforced HF diet education- pt confirm she has bene reading food labels, cut out snacking and making better food choices-wants to start meal prepping Discussed with pt recent provider appt-she saw cardiologist today and has been referred to hypertrophic cardiomyopathy specialist-Dr. Freda Jackson to  possibly be considering to start med to help "shrink heart" Assessed pt's coping with her illness as well as spouse's-she is in better spirits today-saw therapist last week-states spouse doing & feeling better which makes her feel better Reviewed med mgmt with pt-she has been ordered [facility] or Ozempic-denied by insurance-MD trying to getting approval overturned, pt was able to find new insurance for next year-switching to [employer] they will provide coverage for wgt loss meds Reviewed with pt referrals to sleep study for snoring-per chart review-sent to [facility] and referral "ready for initial scheduling"-pt requested contact info and was given info, she has appt with nutritionist referral on 09/03/22  Patient Goals/Self-Care Activities: Participate in Transition of Care Program/Attend [facility] scheduled calls Take all medications as prescribed Attend all scheduled provider appointments Call provider office for new concerns or questions   call office if I gain more than 2 pounds in one day or 5 pounds in one week track weight in diary watch for swelling in feet, ankles and legs every day develop a rescue plan track symptoms and what helps feel better or worse Take wgt and BP log to all MD appts for provider to review Patient will complete sleep study Increase fluid intake per cardiologist recommendations  Follow Up Plan:  Telephone follow up appointment with care management team member scheduled for:  08/07/23-3pm The patient has been provided with contact information for the care management team and has been advised to call with any health related questions or concerns.  Patient verbalizes understanding of instructions and care plan provided today and agrees to view in MyChart. Active MyChart status and patient understanding of how to access instructions and care plan via MyChart confirmed with patient.     The patient has been provided with contact  information for the care management team and has been advised to call with any health related questions or concerns.   Please call the care guide team at [phone] if you need to cancel or reschedule your appointment.   Please call the [place] National Suicide Prevention Lifeline: 859-828-4437 or TTY: 843-756-6979 TTY 339-032-9232) to talk to a trained counselor call 1-800-273-TALK (toll free, 24 hour hotline) if you are experiencing a Mental Health or Behavioral Health Crisis or need someone to talk to.  Antionette Fairy, RN,BSN,CCM RN Care Manager Transitions of Care  [place]  Direct Phone: [phone] Toll Free: [phone] Fax: [phone]

## 2023-07-31 NOTE — Patient Instructions (Addendum)
Medication Instructions:  Your physician has recommended you make the following change in your medication:   1) DECREASE diltiazem (Cardizem) to 120 mg daily 2) INCREASE metoprolol succinate (Toprol XL) to 100 mg daily  *If you need a refill on your cardiac medications before your next appointment, please call your pharmacy*  Lab Work: None ordered today.  Testing/Procedures: None ordered today.  Follow-Up: At [facility], you and your health needs are our priority.  As part of our continuing mission to provide you with exceptional heart care, we have created designated Provider Care Teams.  These Care Teams include your primary Cardiologist (physician) and Advanced Practice Providers (APPs -  Physician Assistants and Nurse Practitioners) who all work together to provide you with the care you need, when you need it.  Your next appointment:   Next available with Dr. Izora Ribas for hypertrophic cardiomyopathy consult.  6 month(s) with Dr. Rosemary Holms  The format for your next appointment:   In Person  Provider:   Elder Negus, MD {  Other Instructions You have been referred to Dr. [name] for hypertrophic cardiomyopathy. Dr. Izora Ribas is located here at our [facility] office.

## 2023-08-01 ENCOUNTER — Other Ambulatory Visit: Payer: Self-pay

## 2023-08-02 ENCOUNTER — Other Ambulatory Visit: Payer: Self-pay

## 2023-08-03 ENCOUNTER — Other Ambulatory Visit: Payer: Self-pay

## 2023-08-03 DIAGNOSIS — R002 Palpitations: Secondary | ICD-10-CM | POA: Diagnosis not present

## 2023-08-04 ENCOUNTER — Other Ambulatory Visit: Payer: Self-pay

## 2023-08-07 ENCOUNTER — Encounter (HOSPITAL_BASED_OUTPATIENT_CLINIC_OR_DEPARTMENT_OTHER): Payer: Self-pay

## 2023-08-07 ENCOUNTER — Encounter: Payer: Self-pay | Admitting: Internal Medicine

## 2023-08-07 ENCOUNTER — Emergency Department (HOSPITAL_BASED_OUTPATIENT_CLINIC_OR_DEPARTMENT_OTHER): Payer: 59

## 2023-08-07 ENCOUNTER — Other Ambulatory Visit: Payer: Self-pay

## 2023-08-07 ENCOUNTER — Ambulatory Visit (INDEPENDENT_AMBULATORY_CARE_PROVIDER_SITE_OTHER): Payer: 59 | Admitting: Internal Medicine

## 2023-08-07 ENCOUNTER — Emergency Department (HOSPITAL_BASED_OUTPATIENT_CLINIC_OR_DEPARTMENT_OTHER)
Admission: EM | Admit: 2023-08-07 | Discharge: 2023-08-07 | Disposition: A | Discharge status: Home / Self Care | Payer: 59 | Attending: Emergency Medicine | Admitting: Emergency Medicine

## 2023-08-07 VITALS — BP 111/65 | HR 65 | Temp 98.0°F | Ht 62.0 in | Wt 230.0 lb

## 2023-08-07 DIAGNOSIS — Z8673 Personal history of transient ischemic attack (TIA), and cerebral infarction without residual deficits: Secondary | ICD-10-CM | POA: Diagnosis not present

## 2023-08-07 DIAGNOSIS — Z5987 Material hardship due to limited financial resources, not elsewhere classified: Secondary | ICD-10-CM

## 2023-08-07 DIAGNOSIS — R0602 Shortness of breath: Secondary | ICD-10-CM | POA: Insufficient documentation

## 2023-08-07 DIAGNOSIS — Z79899 Other long term (current) drug therapy: Secondary | ICD-10-CM | POA: Diagnosis not present

## 2023-08-07 DIAGNOSIS — R042 Hemoptysis: Secondary | ICD-10-CM | POA: Insufficient documentation

## 2023-08-07 DIAGNOSIS — I110 Hypertensive heart disease with heart failure: Secondary | ICD-10-CM | POA: Diagnosis not present

## 2023-08-07 DIAGNOSIS — I5033 Acute on chronic diastolic (congestive) heart failure: Secondary | ICD-10-CM

## 2023-08-07 DIAGNOSIS — R051 Acute cough: Secondary | ICD-10-CM | POA: Diagnosis not present

## 2023-08-07 DIAGNOSIS — I422 Other hypertrophic cardiomyopathy: Secondary | ICD-10-CM | POA: Diagnosis not present

## 2023-08-07 DIAGNOSIS — Z0271 Encounter for disability determination: Secondary | ICD-10-CM | POA: Diagnosis not present

## 2023-08-07 DIAGNOSIS — R059 Cough, unspecified: Secondary | ICD-10-CM | POA: Diagnosis not present

## 2023-08-07 DIAGNOSIS — I509 Heart failure, unspecified: Secondary | ICD-10-CM | POA: Diagnosis not present

## 2023-08-07 DIAGNOSIS — Z7982 Long term (current) use of aspirin: Secondary | ICD-10-CM | POA: Diagnosis not present

## 2023-08-07 DIAGNOSIS — Z1211 Encounter for screening for malignant neoplasm of colon: Secondary | ICD-10-CM

## 2023-08-07 DIAGNOSIS — R0789 Other chest pain: Secondary | ICD-10-CM | POA: Diagnosis not present

## 2023-08-07 LAB — BASIC METABOLIC PANEL
Anion gap: 6 (ref 5–15)
BUN: 17 mg/dL (ref 6–20)
CO2: 31 mmol/L (ref 22–32)
Calcium: 9.7 mg/dL (ref 8.9–10.3)
Chloride: 100 mmol/L (ref 98–111)
Creatinine, Ser: 0.83 mg/dL (ref 0.44–1.00)
GFR, Estimated: >60 mL/min (ref >60)
Glucose, Bld: 86 mg/dL (ref 70–99)
Potassium: 3.6 mmol/L (ref 3.5–5.1)
Sodium: 137 mmol/L (ref 135–145)

## 2023-08-07 LAB — CBC
HCT: 38.8 % (ref 36.0–46.0)
Hemoglobin: 12.8 g/dL (ref 12.0–15.0)
MCH: 27.6 pg (ref 26.0–34.0)
MCHC: 33 g/dL (ref 30.0–36.0)
MCV: 83.6 fL (ref 80.0–100.0)
Platelets: 299 10*3/uL (ref 150–400)
RBC: 4.64 MIL/uL (ref 3.87–5.11)
RDW: 14 % (ref 11.5–15.5)
WBC: 10.8 10*3/uL — ABNORMAL HIGH (ref 4.0–10.5)
nRBC: 0 % (ref 0.0–0.2)

## 2023-08-07 LAB — TROPONIN I (HIGH SENSITIVITY)
Troponin I (High Sensitivity): 10 ng/L (ref <18)
Troponin I (High Sensitivity): 9 ng/L (ref <18)

## 2023-08-07 LAB — BRAIN NATRIURETIC PEPTIDE: B Natriuretic Peptide: 168.1 pg/mL — ABNORMAL HIGH (ref 0.0–100.0)

## 2023-08-07 LAB — D-DIMER, QUANTITATIVE: D-Dimer, Quant: 1.45 ug{FEU}/mL — ABNORMAL HIGH (ref 0.00–0.50)

## 2023-08-07 MED ORDER — IOHEXOL 350 MG/ML SOLN
100.0000 mL | Freq: Once | INTRAVENOUS | Status: AC | PRN
  Administered 2023-08-07: 80 mL via INTRAVENOUS

## 2023-08-07 MED ORDER — SEMAGLUTIDE-WEIGHT MANAGEMENT 1 MG/0.5ML ~~LOC~~ SOAJ
1.0000 mg | SUBCUTANEOUS | 0 refills | Status: DC
  Filled 2023-08-07: qty 2, 28d supply, fill #0

## 2023-08-07 MED ORDER — SEMAGLUTIDE-WEIGHT MANAGEMENT 0.25 MG/0.5ML ~~LOC~~ SOAJ
0.2500 mg | SUBCUTANEOUS | 0 refills | Status: DC
  Filled 2023-08-07 – 2023-08-30 (×6): qty 2, 28d supply, fill #0

## 2023-08-07 MED ORDER — SEMAGLUTIDE-WEIGHT MANAGEMENT 2.4 MG/0.75ML ~~LOC~~ SOAJ
2.4000 mg | SUBCUTANEOUS | 0 refills | Status: DC
  Filled 2023-08-07: qty 3, 28d supply, fill #0

## 2023-08-07 MED ORDER — SEMAGLUTIDE-WEIGHT MANAGEMENT 0.5 MG/0.5ML ~~LOC~~ SOAJ
0.5000 mg | SUBCUTANEOUS | 0 refills | Status: DC
  Filled 2023-08-07 – 2023-09-05 (×2): qty 2, 28d supply, fill #0

## 2023-08-07 MED ORDER — SEMAGLUTIDE-WEIGHT MANAGEMENT 1.7 MG/0.75ML ~~LOC~~ SOAJ
1.7000 mg | SUBCUTANEOUS | 0 refills | Status: DC
  Filled 2023-08-07: qty 3, 28d supply, fill #0

## 2023-08-07 NOTE — Assessment & Plan Note (Signed)
Known systolic murmur Pending cardiology follow-up in February Contributing to exercise intolerance and risk with physical exertion Requires medication adjustment pending specialist evaluation

## 2023-08-07 NOTE — Discharge Instructions (Signed)
You have been seen today for your complaint of blood in your cough. Your lab work was reassuring. Your imaging was reassuring. Follow up with: Your primary care provider in 1 week for reevaluation Please seek immediate medical care if you develop any of the following symptoms: You cough up blood. You have trouble breathing. Your heart is beating very fast. At this time there does not appear to be the presence of an emergent medical condition, however there is always the potential for conditions to change. Please read and follow the below instructions.  Do not take your medicine if  develop an itchy rash, swelling in your mouth or lips, or difficulty breathing; call 911 and seek immediate emergency medical attention if this occurs.  You may review your lab tests and imaging results in their entirety on your MyChart account.  Please discuss all results of fully with your primary care provider and other specialist at your follow-up visit.  Note: Portions of this text may have been transcribed using voice recognition software. Every effort was made to ensure accuracy; however, inadvertent computerized transcription errors may still be present.

## 2023-08-07 NOTE — Assessment & Plan Note (Signed)
Patient will go to emergency room.  On eliquis but associated with recent severe chest pain  Need acute coronary syndrome/pulmonary embolism ruled out

## 2023-08-07 NOTE — Progress Notes (Signed)
[facility]: [id number]   -- Medical Office Visit --  Patient:  Lydia Thompson      Age: 47 y.o.       Sex:  female  Date:   08/07/2023 Today's Healthcare Provider: Lula Olszewski, MD  ==========================================================================     Assessment & Plan Acute on chronic diastolic CHF (congestive heart failure) (HCC) Recent exacerbation with chest pain and hemoptysis, confirmed on ED evaluation Self-managed initially with extra diuretic dose Ongoing dyspnea and fatigue with minimal exertion Significantly impacting ability to perform work duties safely High risk for further decompensation with physical exertion  Continue current medications including furosemide Daily weight monitoring Strict sodium/fluid restrictions Regular vital sign monitoring Early follow-up for any weight gain >2 lbs/day Hemoptysis Patient will go to emergency room.  On eliquis but associated with recent severe chest pain  Need acute coronary syndrome/pulmonary embolism ruled out  Screening for colon cancer  H/O TIA (transient ischemic attack) and stroke  Obesity, Class III, BMI 40-49.9 (morbid obesity) (HCC) BMI 40-49.9 Attempting weight loss medication but facing insurance barriers Compounds cardiac symptoms and limitations Increases risk with physical activities Material hardship due to limited financial resources Financial strain due to medical conditions Insurance coverage barriers for needed medications Impact of medical conditions on work ability Hypertrophic cardiomyopathy (HCC) Known systolic murmur Pending cardiology follow-up in February Contributing to exercise intolerance and risk with physical exertion Requires medication adjustment pending specialist evaluation Encounter for disability examination Completing comprehensive disability documentation Referral to social work for resource assistance Work restrictions: no  lifting, prolonged standing, or strenuous activity     Orders Placed During this Encounter:   ED Discharge Orders          Ordered    Semaglutide-Weight Management 2.4 MG/0.75ML SOAJ  Weekly        08/07/23 1024    Semaglutide-Weight Management 1.7 MG/0.75ML SOAJ  Weekly        08/07/23 1024    Semaglutide-Weight Management 1 MG/0.5ML SOAJ  Weekly        08/07/23 1024    Semaglutide-Weight Management 0.5 MG/0.5ML SOAJ  Weekly        08/07/23 1024    Ambulatory referral to Gastroenterology        08/07/23 1024    Semaglutide-Weight Management 0.25 MG/0.5ML SOAJ  Weekly        08/07/23 1024    AMB Referral [facility]BCI Care Management        08/07/23 1024          Medications   Continue current cardiac medications Submit prior authorization for weight management medication Consider compound pharmacy options if coverage denied   Follow-up   Schedule cardiology follow-up Return to clinic in 1 week Continue VBCI care management support   Safety/Education   Review warning signs requiring immediate evaluation Provide clear activity limitations Reinforce medication compliance   Coordination   Referral to social work services Pharmacy benefits review Care management support continuation Future Appointments  Date Time Provider Department Center  08/14/2023  3:00 PM Fleeta Emmer, RN [facility] None  08/17/2023  3:00 PM Lula Olszewski, MD [facility]  08/29/2023 10:15 AM Huston Foley, MD [facility] None  09/04/2023  9:30 AM Myles Lipps, RD [facility]  09/25/2023 12:00 PM Christell Constant, MD [facility]  09/25/2023  1:00 PM Glennon Mac, PhD [facility]  12/11/2023  8:40 AM Lula Olszewski, MD [facility]  Patient Care Team: Glenetta Hew  Reece Agar, MD as PCP - General (Internal Medicine) Patwardhan, Anabel Bene, MD as PCP - Cardiology (Cardiology) Elder Negus, MD as Consulting Physician (Cardiology) Lynnell Catalan, MD  (Gynecology) Drema Dallas, DO as Consulting Physician (Neurology) Yates Decamp, MD as Consulting Physician (Cardiology)    SUBJECTIVE: 47 y.o. female who has Essential hypertension; Hypertrophic cardiomyopathy (HCC); H/O TIA (transient ischemic attack) and stroke; Chronic heart failure with preserved ejection fraction (HCC); Obesity, Class III, BMI 40-49.9 (morbid obesity) (HCC); Post-menopause atrophic vaginitis; Dizziness; Pulmonary artery hypertension (HCC); Hot flashes, menopausal; Low libido; Nausea; Diastolic dysfunction; Anxiety disorder; Acute exacerbation of congestive heart failure (HCC); Acute respiratory failure with hypoxia (HCC); Acute on chronic diastolic CHF (congestive heart failure) (HCC); Supraventricular tachycardia (HCC); Hypokalemia; Palpitations; and Hemoptysis on their problem list.  Main reasons for visit/main concerns/chief complaint: 2 Week Follow-up, Chest Pain, Congestive Heart Failure, and Hemoptysis   Wearing zio monitor since last visit Coughing up blood Coughing Chest pain for a few days 3 days ago Lasix improved coughing and chest pain  AI-Extracted: Discussed the use of AI scribe software for clinical note transcription with the patient, who gave verbal consent to proceed.  Patient is a woman with history of hypertrophic cardiomyopathy, diastolic heart failure, and class III obesity, who presents for follow-up after emergency department evaluation for hemoptysis and chest pain. Prior to her ED visit, she experienced an episode on Saturday night with severe chest pain and noticed blood in her sputum. She initially self-treated with an extra dose of furosemide which provided some symptom relief. ED evaluation confirmed presence of faint bloody sputum. She reports this has occurred during previous episodes of volume overload.  Her baseline functional status has been declining - she experiences significant fatigue and dyspnea with basic activities like carrying  groceries, requiring frequent rest breaks. She reports her medications cause intermittent lightheadedness and difficulty with focus/concentration. She continues to work despite these limitations, though acknowledges significant struggle with physical demands of her job.  Additional ongoing issues include Class III obesity with BMI 40-49.9. She has been attempting to obtain weight loss medication but faces insurance coverage barriers. She expresses willingness to consider disability application given her progressive cardiac limitations and impact on ability to work safely.  Recent Events: - Saturday night: Severe chest pain episode with hemoptysis  - Self-administered extra furosemide dose - ED visit confirmed bloody sputum - Symptoms improved but ongoing fatigue/dyspnea persist  Current Symptoms: - Dyspnea with minimal exertion - Fatigue requiring frequent rest breaks - Medication-related lightheadedness - Difficulty with prolonged focus/concentration - Unable to lift/carry without significant symptoms   Note that patient  has a past medical history of Anxiety, Chest pain of uncertain etiology (09/26/2017), CHF (congestive heart failure) (HCC), Hot flashes, menopausal (08/30/2022), Hypertension, and Low libido (08/30/2022).  Problem list overviews that were updated at today's visit: Problem  Hemoptysis    Med reconciliation: Current Outpatient Medications on File Prior to Visit  Medication Sig   aspirin EC 81 MG tablet Take 1 tablet (81 mg total) by mouth daily. Swallow whole.   benzonatate (TESSALON PERLES) 100 MG capsule Take 1 capsule (100 mg total) by mouth 3 (three) times daily as needed for cough.   diltiazem (CARDIZEM CD) 120 MG 24 hr capsule Take 1 capsule (120 mg total) by mouth daily.   DULoxetine (CYMBALTA) 60 MG capsule Take 1 capsule (60 mg total) by mouth daily. Replaces 30 mg dose   estradiol (ESTRACE) 0.1 MG/GM vaginal cream Limit use to minimum amount to  control  symptoms. (Patient taking differently: Place 1 Applicatorful vaginally as needed (dryness).)   furosemide (LASIX) 20 MG tablet Take 1 tablet (20 mg total) by mouth 2 (two) times daily.   LORazepam (ATIVAN) 0.5 MG tablet Take 1 tablet (0.5 mg total) by mouth 2 (two) times daily as needed for anxiety.   metoprolol succinate (TOPROL XL) 100 MG 24 hr tablet Take 1 tablet (100 mg total) by mouth daily. Take with or immediately following a meal.   spironolactone (ALDACTONE) 25 MG tablet Take 1 tablet (25 mg total) by mouth daily.   [DISCONTINUED] diltiazem (DILACOR XR) 180 MG 24 hr capsule Take 1 capsule (180 mg total) by mouth daily.   [DISCONTINUED] isosorbide mononitrate (IMDUR) 30 MG 24 hr tablet Take 1 tablet (30 mg total) by mouth daily.   No current facility-administered medications on file prior to visit.   Medications Discontinued During This Encounter  Medication Reason   ondansetron (ZOFRAN-ODT) 4 MG disintegrating tablet Completed Course   Semaglutide-Weight Management (WEGOVY) 0.25 MG/0.5ML SOAJ Completed Course   Semaglutide,0.25 or 0.5MG /DOS, (OZEMPIC, 0.25 OR 0.5 MG/DOSE,) 2 MG/3ML SOPN Completed Course   tirzepatide [facility]) 2.5 MG/0.5ML Pen Completed Course     Objective   Physical Exam     08/07/2023    5:45 PM 08/07/2023    5:30 PM 08/07/2023    5:15 PM  Vitals with BMI  Systolic 94 104 102  Diastolic 64 64 66  Pulse 66 56 56   Wt Readings from Last 10 Encounters:  08/07/23 230 lb (104.3 kg)  08/07/23 230 lb (104.3 kg)  07/31/23 227 lb (103 kg)  07/24/23 228 lb (103.4 kg)  07/09/23 222 lb 9.6 oz (101 kg)  04/14/23 225 lb (102.1 kg)  12/06/22 226 lb (102.5 kg)  10/06/22 228 lb 6.4 oz (103.6 kg)  09/19/22 229 lb 3.2 oz (104 kg)  09/01/22 234 lb (106.1 kg)   Vital signs reviewed.  Nursing notes reviewed. Weight trend reviewed. Abnormalities and Problem-Specific physical exam findings:  normal chest expansion not struggling to breathe at rest  General  Appearance:  No acute distress appreciable.   Well-groomed, healthy-appearing female.  Well proportioned with no abnormal fat distribution.  Good muscle tone. Pulmonary:  Normal work of breathing at rest, no respiratory distress apparent. SpO2: 96 %  Musculoskeletal: All extremities are intact.  Neurological:  Awake, alert, oriented, and engaged.  No obvious focal neurological deficits or cognitive impairments.  Sensorium seems unclouded.   Speech is clear and coherent with logical content. Psychiatric:  Appropriate mood, pleasant and cooperative demeanor, thoughtful and engaged during the exam  Results            Results for orders placed or performed during the hospital encounter of 08/07/23  Basic metabolic panel  Result Value Ref Range   Sodium 137 135 - 145 mmol/L   Potassium 3.6 3.5 - 5.1 mmol/L   Chloride 100 98 - 111 mmol/L   CO2 31 22 - 32 mmol/L   Glucose, Bld 86 70 - 99 mg/dL   BUN 17 6 - 20 mg/dL   Creatinine, Ser 0.10 0.44 - 1.00 mg/dL   Calcium 9.7 8.9 - 27.2 mg/dL   GFR, Estimated >53 >66 mL/min   Anion gap 6 5 - 15  CBC  Result Value Ref Range   WBC 10.8 (H) 4.0 - 10.5 K/uL   RBC 4.64 3.87 - 5.11 MIL/uL   Hemoglobin 12.8 12.0 - 15.0 g/dL   HCT 44.0 34.7 -  46.0 %   MCV 83.6 80.0 - 100.0 fL   MCH 27.6 26.0 - 34.0 pg   MCHC 33.0 30.0 - 36.0 g/dL   RDW 16.1 09.6 - 04.5 %   Platelets 299 150 - 400 K/uL   nRBC 0.0 0.0 - 0.2 %  Brain natriuretic peptide  Result Value Ref Range   B Natriuretic Peptide 168.1 (H) 0.0 - 100.0 pg/mL  D-dimer, quantitative  Result Value Ref Range   D-Dimer, Quant 1.45 (H) 0.00 - 0.50 ug/mL-FEU  Troponin I (High Sensitivity)  Result Value Ref Range   Troponin I (High Sensitivity) 10 <18 ng/L  Troponin I (High Sensitivity)  Result Value Ref Range   Troponin I (High Sensitivity) 9 <18 ng/L    Admission on 08/07/2023, Discharged on 08/07/2023  Component Date Value   Sodium 08/07/2023 137    Potassium 08/07/2023 3.6    Chloride  08/07/2023 100    CO2 08/07/2023 31    Glucose, Bld 08/07/2023 86    BUN 08/07/2023 17    Creatinine, Ser 08/07/2023 0.83    Calcium 08/07/2023 9.7    GFR, Estimated 08/07/2023 >60    Anion gap 08/07/2023 6    WBC 08/07/2023 10.8 (H)    RBC 08/07/2023 4.64    Hemoglobin 08/07/2023 12.8    HCT 08/07/2023 38.8    MCV 08/07/2023 83.6    MCH 08/07/2023 27.6    MCHC 08/07/2023 33.0    RDW 08/07/2023 14.0    Platelets 08/07/2023 299    nRBC 08/07/2023 0.0    Troponin I (High Sensiti* 08/07/2023 10    B Natriuretic Peptide 08/07/2023 168.1 (H)    D-Dimer, Quant 08/07/2023 1.45 (H)    Troponin I (High Sensiti* 08/07/2023 9   Office Visit on 07/24/2023  Component Date Value   Sodium 07/24/2023 135    Potassium 07/24/2023 4.1    Chloride 07/24/2023 100    CO2 07/24/2023 28    Glucose, Bld 07/24/2023 99    BUN 07/24/2023 18    Creatinine, Ser 07/24/2023 0.87    GFR 07/24/2023 80.07    Calcium 07/24/2023 9.5    Pro B Natriuretic peptid* 07/24/2023 120.0 (H)    Microalb, Ur 07/24/2023 <0.7    Creatinine,U 07/24/2023 73.1    Microalb Creat Ratio 07/24/2023 1.0    SARS Coronavirus 2 Ag 07/24/2023 Negative   Admission on 07/07/2023, Discharged on 07/09/2023  Component Date Value   Sodium 07/07/2023 137    Potassium 07/07/2023 3.7    Chloride 07/07/2023 102    CO2 07/07/2023 25    Glucose, Bld 07/07/2023 125 (H)    BUN 07/07/2023 20    Creatinine, Ser 07/07/2023 0.90    Calcium 07/07/2023 8.9    GFR, Estimated 07/07/2023 >60    Anion gap 07/07/2023 10    WBC 07/07/2023 17.6 (H)    RBC 07/07/2023 4.89    Hemoglobin 07/07/2023 13.4    HCT 07/07/2023 41.0    MCV 07/07/2023 83.8    MCH 07/07/2023 27.4    MCHC 07/07/2023 32.7    RDW 07/07/2023 13.9    Platelets 07/07/2023 288    nRBC 07/07/2023 0.0    Troponin I (High Sensiti* 07/07/2023 45 (H)    B Natriuretic Peptide 07/07/2023 468.0 (H)    Troponin I (High Sensiti* 07/07/2023 41 (H)    HIV Screen 4th Generatio* 07/07/2023  Non Reactive    Weight 07/08/2023 3,566.16    Height 07/08/2023 62    BP 07/08/2023 109/74  S' Lateral 07/08/2023 2.40    Area-P 1/2 07/08/2023 3.13    Radius 07/08/2023 0.60    MV M vel 07/08/2023 4.99    MV Peak grad 07/08/2023 99.6    Est EF 07/08/2023 70 - 75%    WBC 07/08/2023 13.7 (H)    RBC 07/08/2023 4.76    Hemoglobin 07/08/2023 12.9    HCT 07/08/2023 39.1    MCV 07/08/2023 82.1    MCH 07/08/2023 27.1    MCHC 07/08/2023 33.0    RDW 07/08/2023 13.4    Platelets 07/08/2023 283    nRBC 07/08/2023 0.0    Neutrophils Relative % 07/08/2023 64    Neutro Abs 07/08/2023 8.6 (H)    Lymphocytes Relative 07/08/2023 25    Lymphs Abs 07/08/2023 3.4    Monocytes Relative 07/08/2023 8    Monocytes Absolute 07/08/2023 1.1 (H)    Eosinophils Relative 07/08/2023 3    Eosinophils Absolute 07/08/2023 0.4    Basophils Relative 07/08/2023 0    Basophils Absolute 07/08/2023 0.0    Immature Granulocytes 07/08/2023 0    Abs Immature Granulocytes 07/08/2023 0.06    Magnesium 07/08/2023 2.2    Phosphorus 07/08/2023 4.2    Sodium 07/08/2023 135    Potassium 07/08/2023 3.3 (L)    Chloride 07/08/2023 97 (L)    CO2 07/08/2023 28    Glucose, Bld 07/08/2023 109 (H)    BUN 07/08/2023 20    Creatinine, Ser 07/08/2023 0.84    Calcium 07/08/2023 9.3    Total Protein 07/08/2023 7.7    Albumin 07/08/2023 3.6    AST 07/08/2023 14 (L)    ALT 07/08/2023 16    Alkaline Phosphatase 07/08/2023 65    Total Bilirubin 07/08/2023 1.2 (H)    GFR, Estimated 07/08/2023 >60    Anion gap 07/08/2023 10    Sodium 07/09/2023 135    Potassium 07/09/2023 4.2    Chloride 07/09/2023 101    CO2 07/09/2023 27    Glucose, Bld 07/09/2023 111 (H)    BUN 07/09/2023 17    Creatinine, Ser 07/09/2023 0.89    Calcium 07/09/2023 9.2    GFR, Estimated 07/09/2023 >60    Anion gap 07/09/2023 7    Magnesium 07/09/2023 2.7 (H)   Scanned Document on 05/04/2023  Component Date Value   HM Mammogram 05/03/2023 0-4 Bi-Rad    Office Visit on 12/06/2022  Component Date Value   Cholesterol 12/06/2022 182    Triglycerides 12/06/2022 95.0    HDL 12/06/2022 34.80 (L)    VLDL 12/06/2022 19.0    LDL Cholesterol 12/06/2022 128 (H)    Total CHOL/HDL Ratio 12/06/2022 5    NonHDL 12/06/2022 146.81    TSH 12/06/2022 2.38    Sodium 12/06/2022 135    Potassium 12/06/2022 4.0    Chloride 12/06/2022 98    CO2 12/06/2022 29    Glucose, Bld 12/06/2022 101 (H)    BUN 12/06/2022 17    Creatinine, Ser 12/06/2022 0.87    Total Bilirubin 12/06/2022 0.7    Alkaline Phosphatase 12/06/2022 70    AST 12/06/2022 16    ALT 12/06/2022 22    Total Protein 12/06/2022 7.8    Albumin 12/06/2022 4.4    GFR 12/06/2022 80.42    Calcium 12/06/2022 9.4    WBC 12/06/2022 10.5    RBC 12/06/2022 5.08    Hemoglobin 12/06/2022 14.1    HCT 12/06/2022 41.9    MCV 12/06/2022 82.5    MCHC 12/06/2022 33.6    RDW 12/06/2022  14.6    Platelets 12/06/2022 315.0    Neutrophils Relative % 12/06/2022 66.0    Lymphocytes Relative 12/06/2022 24.0    Monocytes Relative 12/06/2022 6.6    Eosinophils Relative 12/06/2022 2.8    Basophils Relative 12/06/2022 0.6    Neutro Abs 12/06/2022 6.9    Lymphs Abs 12/06/2022 2.5    Monocytes Absolute 12/06/2022 0.7    Eosinophils Absolute 12/06/2022 0.3    Basophils Absolute 12/06/2022 0.1    Hgb A1c MFr Bld 12/06/2022 5.5    Magnesium 12/06/2022 2.2    No image results found.   CT Angio Chest PE W/Cm &/Or Wo Cm Result Date: 08/07/2023 CLINICAL DATA:  Chest pain and shortness of breath with hemoptysis. EXAM: CT ANGIOGRAPHY CHEST WITH CONTRAST TECHNIQUE: Multidetector CT imaging of the chest was performed using the standard protocol during bolus administration of intravenous contrast. Multiplanar CT image reconstructions and MIPs were obtained to evaluate the vascular anatomy. RADIATION DOSE REDUCTION: This exam was performed according to the departmental dose-optimization program which includes automated  exposure control, adjustment of the mA and/or kV according to patient size and/or use of iterative reconstruction technique. CONTRAST:  80mL OMNIPAQUE IOHEXOL 350 MG/ML SOLN COMPARISON:  October 17, 2018 FINDINGS: Cardiovascular: The thoracic aorta is normal in appearance. Satisfactory opacification of the pulmonary arteries to the segmental level. No evidence of pulmonary embolism. Normal heart size. No pericardial effusion. Mediastinum/Nodes: No enlarged mediastinal, hilar, or axillary lymph nodes. Thyroid gland, trachea, and esophagus demonstrate no significant findings. Lungs/Pleura: Areas of patchy in a mild to moderate severity ground-glass appearance of the lung parenchyma is seen throughout the bilateral lower lobes. There is no evidence of acute infiltrate, pleural effusion or pneumothorax. Upper Abdomen: No acute abnormality. Musculoskeletal: No chest wall abnormality. No acute or significant osseous findings. Review of the MIP images confirms the above findings. IMPRESSION: 1. No evidence of pulmonary embolism. 2. Areas of ground-glass appearance of the lung parenchyma which is a nonspecific finding and may be seen in the setting of pulmonary edema or atypical infection. Electronically Signed   By: Aram Candela M.D.   On: 08/07/2023 14:51   DG Chest Port 1 View Result Date: 08/07/2023 CLINICAL DATA:  Cough for 3 days EXAM: PORTABLE CHEST 1 VIEW COMPARISON:  X-ray 07/07/2023. FINDINGS: The heart size and mediastinal contours are within normal limits. Both lungs are clear. Underinflation. No consolidation, pneumothorax or effusion. No edema. Eventration of the right hemidiaphragm. The visualized skeletal structures are unremarkable. Overlapping cardiac leads. Artifact overlying the left upper thorax as well. IMPRESSION: Underinflation.  No acute cardiopulmonary disease. Electronically Signed   By: Karen Kays M.D.   On: 08/07/2023 13:47   MR CARDIAC MORPHOLOGY W WO CONTRAST Result Date:  07/26/2023 CLINICAL DATA:  HCM EXAM: CARDIAC MRI TECHNIQUE: The patient was scanned on a 1.5 Tesla Siemens magnet. A dedicated cardiac coil was used. Functional imaging was done using Fiesta sequences. 2,3, and 4 chamber views were done to assess for RWMA's. Modified Simpson's rule using a short axis stack was used to calculate an ejection fraction on a dedicated work [employer]. The patient received 13 cc of Gadavist. After 10 minutes inversion recovery sequences were used to assess for infiltration and scar tissue. Velocity flow mapping performed in the ascending aorta and main pulmonary artery. CONTRAST:  13 cc  of Gadavist FINDINGS: 1. Normal left ventricular size, Severe asymmetric LV basal-septal wall thickness, measuring upto 1.8cm. Normal systolic function (LVEF = 57%). There are no regional  wall motion abnormalities. 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 There is no late gadolinium enhancement in the left ventricular myocardium. 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 Normal right ventricular size, thickness and systolic function (RVEF = 54%). There are no regional wall motion abnormalities. 3.  Moderately dilated left atrial size, normal right atrial size. 4. Normal size of the aortic root, ascending aorta and pulmonary artery. 5. Mild mitral regurgitation. no significant valvular abnormalities. 6.  Normal pericardium.  No pericardial effusion. IMPRESSION: 1. Normal LV size and systolic function, LVEF = 57%. 2. Severe asymmetric LV basal-septal wall thickness, measuring upto 1.8 cm. 3. 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 4. There is no late gadolinium enhancement in the left ventricular myocardium. 5. Normal RV function. 6. 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 Electronically Signed   By: Debbe Odea M.D.   On: 07/26/2023 18:17    MR CARDIAC VELOCITY FLOW MAP Result Date: 07/26/2023 CLINICAL DATA:  HCM EXAM: CARDIAC MRI TECHNIQUE: The patient was scanned on a 1.5 Tesla Siemens magnet. A dedicated cardiac coil was used. Functional imaging was done using Fiesta sequences. 2,3, and 4 chamber views were done to assess for RWMA's. Modified Simpson's rule using a short axis stack was used to calculate an ejection fraction on a dedicated work [employer] officer, [employer]. The patient received 13 cc of Gadavist. After 10 minutes inversion recovery sequences were used to assess for infiltration and scar tissue. Velocity flow mapping performed in the ascending aorta and main pulmonary artery. CONTRAST:  13 cc  of Gadavist FINDINGS: 1. Normal left ventricular size, Severe asymmetric LV basal-septal wall thickness, measuring upto 1.8cm. Normal systolic function (LVEF = 57%). There are no regional wall motion abnormalities. 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 There is no late gadolinium enhancement in the left ventricular myocardium. 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 Normal right ventricular size, thickness and systolic function (RVEF = 54%). There are no regional wall motion abnormalities. 3.  Moderately dilated left atrial size, normal right atrial size. 4. Normal size of the aortic root, ascending aorta and pulmonary artery. 5. Mild mitral regurgitation. no significant valvular abnormalities. 6.  Normal pericardium.  No pericardial effusion. IMPRESSION: 1. Normal LV size and systolic function, LVEF = 57%. 2. Severe asymmetric LV basal-septal wall thickness, measuring upto 1.8 cm. 3. 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 4. There is no late gadolinium enhancement in the left ventricular myocardium. 5. Normal RV function. 6. 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 Electronically Signed   By:  Debbe Odea M.D.   On: 07/26/2023 18:17   MR CARDIAC VELOCITY FLOW MAP Result Date: 07/26/2023 CLINICAL DATA:  HCM EXAM: CARDIAC MRI TECHNIQUE: The patient was scanned on a 1.5 Tesla Siemens magnet. A dedicated cardiac coil was used. Functional imaging was done using Fiesta sequences. 2,3, and 4 chamber views were done to assess for RWMA's. Modified Simpson's rule using a short axis stack was used to calculate an ejection fraction on a dedicated work [employer] officer, [employer]. The patient received 13 cc of Gadavist. After 10 minutes inversion recovery sequences were used to assess for infiltration and scar tissue. Velocity flow mapping performed in the ascending aorta and main pulmonary artery. CONTRAST:  13 cc  of Gadavist FINDINGS:  1. Normal left ventricular size, Severe asymmetric LV basal-septal wall thickness, measuring upto 1.8cm. Normal systolic function (LVEF = 57%). There are no regional wall motion abnormalities. 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 There is no late gadolinium enhancement in the left ventricular myocardium. 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 Normal right ventricular size, thickness and systolic function (RVEF = 54%). There are no regional wall motion abnormalities. 3.  Moderately dilated left atrial size, normal right atrial size. 4. Normal size of the aortic root, ascending aorta and pulmonary artery. 5. Mild mitral regurgitation. no significant valvular abnormalities. 6.  Normal pericardium.  No pericardial effusion. IMPRESSION: 1. Normal LV size and systolic function, LVEF = 57%. 2. Severe asymmetric LV basal-septal wall thickness, measuring upto 1.8 cm. 3. 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 4. There is no late gadolinium enhancement in the left ventricular myocardium. 5. Normal RV function. 6. 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 Electronically Signed   By: Debbe Odea M.D.   On: 07/26/2023 18:17   ECHOCARDIOGRAM COMPLETE Result Date: 07/08/2023    ECHOCARDIOGRAM REPORT   Patient Name:   TOKIE ZEMP Date of Exam: 07/08/2023 Medical Rec #:  [id number]     Height:       62.0 in Accession #:    [id number]    Weight:       222.9 lb Date of Birth:  12-17-1975    BSA:          2.002 m Patient Age:    47 years      BP:           109/74 mmHg Patient Gender: F             HR:           66 bpm. Exam Location:  Inpatient Procedure: 2D Echo, Color Doppler, Cardiac Doppler and Intracardiac            Opacification Agent Indications:    R06.9 DOE  History:        Patient has prior history of Echocardiogram examinations, most                 recent 05/29/2022. CHF, Pulmonary HTN; Risk Factors:Hypertension.  Sonographer:    Irving Burton Senior RDCS Referring Phys: [phone] PROSPER M AMPONSAH IMPRESSIONS  1. 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 Left ventricular ejection fraction, by estimation, is 70 to 75%. The left ventricle has hyperdynamic  function. The left ventricle has no regional wall motion abnormalities. There is severe asymmetric left ventricular hypertrophy of the septal segment (basal septum measures 20 mm). Hypertrophied papillary muscles. Left ventricular diastolic parameters are indeterminate.  2. Right ventricular systolic function is normal. The right ventricular size is normal. Tricuspid regurgitation signal is inadequate for assessing PA pressure.  3. Left atrial size was severely dilated.  4. Systolic anterior motion of the mitral valve in the setting of LVOT obstruction. The mitral valve is grossly normal. Moderate to severe mitral valve regurgitation. No evidence of mitral stenosis.  5. The aortic valve is grossly normal. Aortic valve regurgitation is not visualized. No aortic stenosis is  present.  6. The inferior vena cava is normal in size with greater than 50% respiratory variability, suggesting right atrial pressure of 3 mmHg. 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 Left ventricular ejection fraction, by estimation, is 70 to 75%. The left ventricle has  hyperdynamic function. The left ventricle has no regional wall motion abnormalities. Definity contrast agent was given IV to delineate the left ventricular endocardial borders. The left ventricular internal cavity size was normal in size. There is severe asymmetric left ventricular hypertrophy of the septal segment. Left ventricular diastolic parameters are indeterminate. Right Ventricle: The right ventricular size is normal. Right vetricular wall thickness was not well visualized. Right ventricular systolic function is normal. Tricuspid regurgitation signal is inadequate for assessing PA pressure. Left Atrium: Left atrial size was severely dilated. Right Atrium: Right atrial size was normal in size. Pericardium: There is no evidence of pericardial effusion. Mitral Valve: Systolic anterior motion of the mitral valve in the setting of LVOT obstruction. The mitral valve is grossly normal. Moderate to severe mitral valve regurgitation, with eccentric posteriorly directed jet. No evidence of mitral valve stenosis. Tricuspid Valve: The tricuspid valve is normal in structure. Tricuspid valve regurgitation is trivial. No evidence of tricuspid stenosis. Aortic Valve: The aortic valve is grossly normal. Aortic valve regurgitation is not visualized. No aortic stenosis is present. Pulmonic Valve: The pulmonic valve was normal in structure. Pulmonic valve regurgitation is trivial. No evidence of pulmonic stenosis. Aorta: The aortic root and ascending aorta are structurally normal, with no evidence of dilitation. Venous: The  inferior vena cava is normal in size with greater than 50% respiratory variability, suggesting right atrial pressure of 3 mmHg. IAS/Shunts: The interatrial septum was not well visualized.  LEFT VENTRICLE PLAX 2D LVIDd:         3.80 cm   Diastology LVIDs:         2.40 cm   LV e' medial:    7.40 cm/s LV PW:         1.20 cm   LV E/e' medial:  13.0 LV IVS:        2.00 cm   LV e' lateral:   10.90 cm/s LVOT diam:     2.00 cm   LV E/e' lateral: 8.8 LVOT Area:     3.14 cm  RIGHT VENTRICLE RV S prime:     13.90 cm/s TAPSE (M-mode): 1.8 cm LEFT ATRIUM              Index        RIGHT ATRIUM           Index LA diam:        5.00 cm  2.50 cm/m   RA Area:     15.80 cm LA Vol (A2C):   87.7 ml  43.81 ml/m  RA Volume:   41.00 ml  20.48 ml/m LA Vol (A4C):   130.0 ml 64.94 ml/m LA Biplane Vol: 112.0 ml 55.95 ml/m   AORTA Ao Root diam: 2.80 cm Ao Asc diam:  3.20 cm MITRAL VALVE MV Area (PHT): 3.13 cm       SHUNTS MV Decel Time: 242 msec       Systemic Diam: 2.00 cm MR Peak grad:    99.6 mmHg MR Mean grad:    77.0 mmHg MR Vmax:         499.00 cm/s MR Vmean:        427.0 cm/s MR PISA:         2.26 cm MR PISA Eff ROA: 17  mm MR PISA Radius:  0.60 cm MV E velocity: 95.90 cm/s MV A velocity: 65.50 cm/s MV E/A ratio:  1.46 Weston Brass MD Electronically signed by Weston Brass MD Signature Date/Time: 07/08/2023/4:16:34 PM    Final    DG Chest 2 View Result Date: 07/07/2023 CLINICAL DATA:  Chest pain that started on Sunday.  History of CHF EXAM: CHEST - 2 VIEW COMPARISON:  02/19/2020 FINDINGS: Diffuse interstitial opacity with Kerley lines, fissure thickening, airway cuffing. Symmetric hazy airspace disease. Borderline heart size. Upper mediastinal contours are stable. IMPRESSION: Pulmonary edema. Electronically Signed   By: Tiburcio Pea M.D.   On: 07/07/2023 07:06    MR CARDIAC MORPHOLOGY W WO CONTRAST Result Date: 07/26/2023 CLINICAL DATA:  HCM EXAM: CARDIAC MRI TECHNIQUE: The patient was scanned on a 1.5 Tesla  Siemens magnet. A dedicated cardiac coil was used. Functional imaging was done using Fiesta sequences. 2,3, and 4 chamber views were done to assess for RWMA's. Modified Simpson's rule using a short axis stack was used to calculate an ejection fraction on a dedicated work [employer]. The patient received 13 cc of Gadavist. After 10 minutes inversion recovery sequences were used to assess for infiltration and scar tissue. Velocity flow mapping performed in the ascending aorta and main pulmonary artery. CONTRAST:  13 cc  of Gadavist FINDINGS: 1. Normal left ventricular size, Severe asymmetric LV basal-septal wall thickness, measuring upto 1.8cm. Normal systolic function (LVEF = 57%). There are no regional wall motion abnormalities. 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 There is no late gadolinium enhancement in the left ventricular myocardium. 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 Normal right ventricular size, thickness and systolic function (RVEF = 54%). There are no regional wall motion abnormalities. 3.  Moderately dilated left atrial size, normal right atrial size. 4. Normal size of the aortic root, ascending aorta and pulmonary artery. 5. Mild mitral regurgitation. no significant valvular abnormalities. 6.  Normal pericardium.  No pericardial effusion. IMPRESSION: 1. Normal LV size and systolic function, LVEF = 57%. 2. Severe asymmetric LV basal-septal wall thickness, measuring upto 1.8 cm. 3. 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 4. There is no late gadolinium enhancement in the left ventricular myocardium. 5. Normal RV function. 6. 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 Electronically Signed   By: Debbe Odea M.D.   On: 07/26/2023 18:17   MR CARDIAC VELOCITY FLOW MAP Result Date: 07/26/2023 CLINICAL DATA:  HCM EXAM: CARDIAC MRI  TECHNIQUE: The patient was scanned on a 1.5 Tesla Siemens magnet. A dedicated cardiac coil was used. Functional imaging was done using Fiesta sequences. 2,3, and 4 chamber views were done to assess for RWMA's. Modified Simpson's rule using a short axis stack was used to calculate an ejection fraction on a dedicated work [employer]. The patient received 13 cc of Gadavist. After 10 minutes inversion recovery sequences were used to assess for infiltration and scar tissue. Velocity flow mapping performed in the ascending aorta and main pulmonary artery. CONTRAST:  13 cc  of Gadavist FINDINGS: 1. Normal left ventricular size, Severe asymmetric LV basal-septal wall thickness, measuring upto 1.8cm. Normal systolic function (LVEF = 57%). There are no regional wall motion abnormalities. 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 There is no late gadolinium enhancement in the left ventricular myocardium. 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 Normal right ventricular size, thickness and systolic function (RVEF = 54%). There are no regional wall motion abnormalities. 3.  Moderately dilated left atrial size, normal right atrial size. 4. Normal size of the aortic root, ascending aorta and pulmonary artery. 5. Mild mitral regurgitation. no significant valvular abnormalities. 6.  Normal pericardium.  No pericardial effusion. IMPRESSION: 1. Normal LV size and systolic function, LVEF = 57%. 2. Severe asymmetric LV basal-septal wall thickness, measuring upto 1.8 cm. 3. 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 4. There is no late gadolinium enhancement in the left ventricular myocardium. 5. Normal RV function. 6. 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 Electronically Signed   By: Debbe Odea M.D.   On: 07/26/2023 18:17   MR CARDIAC VELOCITY FLOW MAP Result Date:  07/26/2023 CLINICAL DATA:  HCM EXAM: CARDIAC MRI TECHNIQUE: The patient was scanned on a 1.5 Tesla Siemens magnet. A dedicated cardiac coil was used. Functional imaging was done using Fiesta sequences. 2,3, and 4 chamber views were done to assess for RWMA's. Modified Simpson's rule using a short axis stack was used to calculate an ejection fraction on a dedicated work [employer]. The patient received 13 cc of Gadavist. After 10 minutes inversion recovery sequences were used to assess for infiltration and scar tissue. Velocity flow mapping performed in the ascending aorta and main pulmonary artery. CONTRAST:  13 cc  of Gadavist FINDINGS: 1. Normal left ventricular size, Severe asymmetric LV basal-septal wall thickness, measuring upto 1.8cm. Normal systolic function (LVEF = 57%). There are no regional wall motion abnormalities. 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 There is no late gadolinium enhancement in the left ventricular myocardium. 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 Normal right ventricular size, thickness and systolic function (RVEF = 54%). There are no regional wall motion abnormalities. 3.  Moderately dilated left atrial size, normal right atrial size. 4. Normal size of the aortic root, ascending aorta and pulmonary artery. 5. Mild mitral regurgitation. no significant valvular abnormalities. 6.  Normal pericardium.  No pericardial effusion. IMPRESSION: 1. Normal LV size and systolic function, LVEF = 57%. 2. Severe asymmetric LV basal-septal wall thickness, measuring upto 1.8 cm. 3. 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 4. There is no late gadolinium enhancement in the left ventricular myocardium. 5. Normal RV function. 6. 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 Electronically Signed   By: Debbe Odea M.D.   On: 07/26/2023 18:17         Additional Info: This encounter employed real-time, collaborative documentation. The patient actively reviewed and updated their medical record on a shared screen, ensuring transparency and facilitating joint problem-solving for the problem list, overview, and plan. This approach promotes accurate, informed care. The treatment plan was discussed and reviewed in detail, including medication safety, potential side effects, and all patient questions. We confirmed understanding and comfort with the plan. Follow-up instructions were established, including contacting the office for any concerns, returning if symptoms worsen, persist, or new symptoms develop, and precautions for potential emergency department visits.

## 2023-08-07 NOTE — ED Notes (Signed)
Pt transported to CT ?

## 2023-08-07 NOTE — ED Provider Notes (Signed)
[facility] AT [facility] Provider Note   CSN: [id number] Arrival date & time: 08/07/23  1039     History  Chief Complaint  Patient presents with   Chest Pain    Lydia Thompson is a 47 y.o. female.  With a history of hypertension, anxiety, CHF due to hypertrophic cardiomyopathy with a EF of 70 to 75% presenting to the ED for evaluation of hemoptysis.  She reports her symptoms began 2 days ago.  States she felt a rattle in her lungs which typically occurs when she is fluid overloaded.  She had significant coughing 2 days ago and reported a single episode of hemoptysis.  She took an extra dose of her Lasix yesterday and today which seemed to improve her shortness of breath and cough.  She denies ever having any chest pain.  No unilateral leg swelling, history of DVT or PE, recent cancer treatments, surgeries, long distance travel, birth control use.  She presented to her primary care provider today who was concerned for PE and sent her to the emergency department for further evaluation.   Chest Pain Associated symptoms: cough        Home Medications Prior to Admission medications   Medication Sig Start Date End Date Taking? Authorizing Provider  aspirin EC 81 MG tablet Take 1 tablet (81 mg total) by mouth daily. Swallow whole. 09/01/22   Patwardhan, Anabel Bene, MD  benzonatate (TESSALON PERLES) 100 MG capsule Take 1 capsule (100 mg total) by mouth 3 (three) times daily as needed for cough. 07/24/23   Lula Olszewski, MD  diltiazem (CARDIZEM CD) 120 MG 24 hr capsule Take 1 capsule (120 mg total) by mouth daily. 07/31/23   Patwardhan, Anabel Bene, MD  DULoxetine (CYMBALTA) 60 MG capsule Take 1 capsule (60 mg total) by mouth daily. Replaces 30 mg dose 07/24/23   Lula Olszewski, MD  estradiol (ESTRACE) 0.1 MG/GM vaginal cream Limit use to minimum amount to control symptoms. Patient taking differently: Place 1 Applicatorful vaginally as needed (dryness). 08/30/22    Lula Olszewski, MD  furosemide (LASIX) 20 MG tablet Take 1 tablet (20 mg total) by mouth 2 (two) times daily. 07/19/23   Patwardhan, Anabel Bene, MD  LORazepam (ATIVAN) 0.5 MG tablet Take 1 tablet (0.5 mg total) by mouth 2 (two) times daily as needed for anxiety. 07/24/23   Lula Olszewski, MD  metoprolol succinate (TOPROL XL) 100 MG 24 hr tablet Take 1 tablet (100 mg total) by mouth daily. Take with or immediately following a meal. 07/31/23   Patwardhan, Anabel Bene, MD  Semaglutide-Weight Management 0.25 MG/0.5ML SOAJ Inject 0.25 mg into the skin once a week for 28 days. 08/07/23 09/04/23  Lula Olszewski, MD  Semaglutide-Weight Management 0.5 MG/0.5ML SOAJ Inject 0.5 mg into the skin once a week for 28 days. 09/05/23 10/03/23  Lula Olszewski, MD  Semaglutide-Weight Management 1 MG/0.5ML SOAJ Inject 1 mg into the skin once a week for 28 days. 10/04/23 11/01/23  Lula Olszewski, MD  Semaglutide-Weight Management 1.7 MG/0.75ML SOAJ Inject 1.7 mg into the skin once a week for 28 days. 11/02/23 11/30/23  Lula Olszewski, MD  Semaglutide-Weight Management 2.4 MG/0.75ML SOAJ Inject 2.4 mg into the skin once a week for 28 days. 12/01/23 12/29/23  Lula Olszewski, MD  spironolactone (ALDACTONE) 25 MG tablet Take 1 tablet (25 mg total) by mouth daily. 07/31/23   Patwardhan, Anabel Bene, MD  diltiazem (DILACOR XR) 180 MG 24 hr  capsule Take 1 capsule (180 mg total) by mouth daily. 06/02/22 09/01/22  Patwardhan, Anabel Bene, MD  isosorbide mononitrate (IMDUR) 30 MG 24 hr tablet Take 1 tablet (30 mg total) by mouth daily. 09/18/20 10/23/20  Elder Negus, MD      Allergies    Patient has no known allergies.    Review of Systems   Review of Systems  Respiratory:  Positive for cough.   Cardiovascular:  Positive for chest pain.  All other systems reviewed and are negative.   Physical Exam Updated Vital Signs BP 102/66   Pulse (!) 56   Temp 98.2 F (36.8 C) (Oral)   Resp 17   Ht 5\' 2"  (1.575 m)   Wt 104.3 kg    SpO2 98%   BMI 42.07 kg/m  Physical Exam Vitals and nursing note reviewed.  Constitutional:      General: She is not in acute distress.    Appearance: She is well-developed. She is not ill-appearing, toxic-appearing or diaphoretic.     Comments: Resting comfortably in bed  HENT:     Head: Normocephalic and atraumatic.  Eyes:     Conjunctiva/sclera: Conjunctivae normal.  Cardiovascular:     Rate and Rhythm: Normal rate and regular rhythm.     Heart sounds: No murmur heard. Pulmonary:     Effort: Pulmonary effort is normal. No respiratory distress.     Breath sounds: Examination of the right-lower field reveals rales. Examination of the left-lower field reveals rales. Rales present. No decreased breath sounds, wheezing or rhonchi.  Abdominal:     Palpations: Abdomen is soft.     Tenderness: There is no abdominal tenderness.  Musculoskeletal:        General: No swelling.     Cervical back: Neck supple.     Right lower leg: No edema.     Left lower leg: No edema.  Skin:    General: Skin is warm and dry.     Capillary Refill: Capillary refill takes less than 2 seconds.  Neurological:     General: No focal deficit present.     Mental Status: She is alert and oriented to person, place, and time.  Psychiatric:        Mood and Affect: Mood normal.     ED Results / Procedures / Treatments   Labs (all labs ordered are listed, but only abnormal results are displayed) Labs Reviewed  CBC - Abnormal; Notable for the following components:      Result Value   WBC 10.8 (*)    All other components within normal limits  BRAIN NATRIURETIC PEPTIDE - Abnormal; Notable for the following components:   B Natriuretic Peptide 168.1 (*)    All other components within normal limits  D-DIMER, QUANTITATIVE - Abnormal; Notable for the following components:   D-Dimer, Quant 1.45 (*)    All other components within normal limits  BASIC METABOLIC PANEL  TROPONIN I (HIGH SENSITIVITY)  TROPONIN I  (HIGH SENSITIVITY)    EKG EKG Interpretation Date/Time:  Monday August 07 2023 10:48:18 EST Ventricular Rate:  60 PR Interval:  138 QRS Duration:  102 QT Interval:  416 QTC Calculation: 416 R Axis:   39  Text Interpretation: Normal sinus rhythm Possible Left atrial enlargement Left ventricular hypertrophy with repolarization abnormality ( Sokolow-Lyon ) Abnormal ECG When compared with ECG of 07-Jul-2023 18:00, No significant change was found Confirmed by Derwood Kaplan [phone]) on 08/07/2023 1:17:40 PM  Radiology CT Angio Chest PE W/Cm &/Or  Wo Cm Result Date: 08/07/2023 CLINICAL DATA:  Chest pain and shortness of breath with hemoptysis. EXAM: CT ANGIOGRAPHY CHEST WITH CONTRAST TECHNIQUE: Multidetector CT imaging of the chest was performed using the standard protocol during bolus administration of intravenous contrast. Multiplanar CT image reconstructions and MIPs were obtained to evaluate the vascular anatomy. RADIATION DOSE REDUCTION: This exam was performed according to the departmental dose-optimization program which includes automated exposure control, adjustment of the mA and/or kV according to patient size and/or use of iterative reconstruction technique. CONTRAST:  80mL OMNIPAQUE IOHEXOL 350 MG/ML SOLN COMPARISON:  October 17, 2018 FINDINGS: Cardiovascular: The thoracic aorta is normal in appearance. Satisfactory opacification of the pulmonary arteries to the segmental level. No evidence of pulmonary embolism. Normal heart size. No pericardial effusion. Mediastinum/Nodes: No enlarged mediastinal, hilar, or axillary lymph nodes. Thyroid gland, trachea, and esophagus demonstrate no significant findings. Lungs/Pleura: Areas of patchy in a mild to moderate severity ground-glass appearance of the lung parenchyma is seen throughout the bilateral lower lobes. There is no evidence of acute infiltrate, pleural effusion or pneumothorax. Upper Abdomen: No acute abnormality. Musculoskeletal: No chest  wall abnormality. No acute or significant osseous findings. Review of the MIP images confirms the above findings. IMPRESSION: 1. No evidence of pulmonary embolism. 2. Areas of ground-glass appearance of the lung parenchyma which is a nonspecific finding and may be seen in the setting of pulmonary edema or atypical infection. Electronically Signed   By: Aram Candela M.D.   On: 08/07/2023 14:51   DG Chest Port 1 View Result Date: 08/07/2023 CLINICAL DATA:  Cough for 3 days EXAM: PORTABLE CHEST 1 VIEW COMPARISON:  X-ray 07/07/2023. FINDINGS: The heart size and mediastinal contours are within normal limits. Both lungs are clear. Underinflation. No consolidation, pneumothorax or effusion. No edema. Eventration of the right hemidiaphragm. The visualized skeletal structures are unremarkable. Overlapping cardiac leads. Artifact overlying the left upper thorax as well. IMPRESSION: Underinflation.  No acute cardiopulmonary disease. Electronically Signed   By: Karen Kays M.D.   On: 08/07/2023 13:47    Procedures Procedures    Medications Ordered in ED Medications  iohexol (OMNIPAQUE) 350 MG/ML injection 100 mL (80 mLs Intravenous Contrast Given 08/07/23 1321)    ED Course/ Medical Decision Making/ A&P                                 Medical Decision Making Amount and/or Complexity of Data Reviewed Labs: ordered. Radiology: ordered.  Risk Prescription drug management.  This patient presents to the ED for concern of cough, this involves an extensive number of treatment options, and is a complaint that carries with it a high risk of complications and morbidity.  Differential diagnosis for emergent cause of cough includes but is not limited to upper respiratory infection, lower respiratory infection, allergies, asthma, irritants, foreign body, medications such as ACE inhibitors, reflux, asthma, CHF, lung cancer, interstitial lung disease, psychiatric causes, postnasal drip and postinfectious  bronchospasm.   My initial workup includes labs, imaging, EKG  Additional history obtained from: Nursing notes from this visit.  I ordered, reviewed and interpreted labs which include: CBC, BMP, troponin, BNP, D-dimer  I ordered imaging studies including chest x-ray, CT PE study I independently visualized and interpreted imaging which showed no acute abnormalities of chest x-ray.  CT PE study negative for PE but does show likely bilateral pulmonary edema I agree with the radiologist interpretation  Cardiac Monitoring:  The patient  was maintained on a cardiac monitor.  I personally viewed and interpreted the cardiac monitored which showed an underlying rhythm of: NSR  Afebrile, hemodynamically stable.  47 year old female presenting for evaluation of hemoptysis.  She had 1 episode on Saturday.  Was sent to the emergency department for evaluation of potential PE by her primary care provider.  D-dimer was elevated.  CT PE study negative for PE.  Does show pulmonary edema.  Patient takes Lasix as needed for this.  She denies any shortness of breath or cough today.  No chest pain.  Initial delta troponin negative.  BNP improved from baseline.  Patient is asymptomatic.  She was encouraged to follow-up with her primary care provider in 1 week for reevaluation.  She was given return precautions.  Stable at discharge.  At this time there does not appear to be any evidence of an acute emergency medical condition and the patient appears stable for discharge with appropriate outpatient follow up. Diagnosis was discussed with patient who verbalizes understanding of care plan and is agreeable to discharge. I have discussed return precautions with patient who verbalizes understanding. Patient encouraged to follow-up with their PCP within 1 week. All questions answered.  Note: Portions of this report may have been transcribed using voice recognition software. Every effort was made to ensure accuracy; however,  inadvertent computerized transcription errors may still be present.         Final Clinical Impression(s) / ED Diagnoses Final diagnoses:  Acute cough    Rx / DC Orders ED Discharge Orders     None         Michelle Piper, Cordelia Poche 08/07/23 1741    Derwood Kaplan, MD 08/08/23 1343

## 2023-08-07 NOTE — ED Notes (Signed)
X-ray at bedside

## 2023-08-07 NOTE — Patient Outreach (Signed)
Care Management  Transitions of Care Program Transitions of Care Post-discharge Week 5   08/07/2023 Name: Lydia Thompson MRN: [id number] DOB: 08-07-1976  Subjective: Lydia Thompson is a 47 y.o. year old female who is a primary care patient of Lula Olszewski, MD. The Care Management team Engaged with patient by telephone to assess and address transitions of care needs.   Consent to Services:  Patient has successfully completed 30-day TOC program. Agreeable to transfer to longitudinal RN CM and follow up appt scheduled.  Assessment:   Spoke with pt who voices hs is doing okay. She went for [facility] f/u appt today. She shared with provider that she has some chest pain and has been coughing up some blood over the weekend. MD advised her to go to ED for further eval. Pt voices she is currently in ED.She is awaiting final test results but states so far all her test have "been okay and just showed she had a little fluid around her lungs and heart." Pt stares he feels like she may have "did too much these past few days. She has been busy running errands and doing things with her kids. She denise any RN CM needs or concerns at this time. She is aware that RN CM will follow to see disposition and if pt does not get admitted she will transfer to longitudinal CCM for continued education and support.        SDOH Interventions    Flowsheet Row Telephone from 07/10/2023 in [place] [facility]PART[facility]  SDOH Interventions   Food Insecurity Interventions Intervention Not Indicated  Housing Interventions Intervention Not Indicated  Transportation Interventions Intervention Not Indicated  Utilities Interventions Intervention Not Indicated        Goals Addressed             This Visit's Progress    TOC Care Plan       /Current Barriers:  Knowledge Deficits related to plan of care for management of CHF   RNCM Clinical Goal(s):  Patient will work with the Care Management team over the  next 30 days to address Transition of Care Barriers: CHF mgmt verbalize understanding of plan for management of CHF as evidenced by adherence to plan of care take all medications exactly as prescribed and will call provider for medication related questions as evidenced by med adherence/compliance demonstrate Improved health management independence as evidenced by no exacerbation of sxs or readmission within the next 30 days  through collaboration with RN Care manager, provider, and care team.   Interventions: Evaluation of current treatment plan related to  self management and patient's adherence to plan as established by provider   Heart Failure Interventions:  (Status:  Goal Met.) Short Term Goal Continued HF education and reinforcement to pt-pt states cardiologist today told her she "should be drinking more water 2-3L to decrease strain/work on heart" Reviewed wgt and BP log with pt-she continues to monitor wgt and BP daily,- wgt today 223lbs at home and 227lbs at MD office, denies any swelling Reinforced HF diet education- pt confirm she has been reading food labels, cut out snacking and making better food choices-wants to start meal prepping Discussed with pt recent provider appt-she saw cardiologist today and has been referred to hypertrophic cardiomyopathy specialist-Dr. Freda Jackson to possibly be considered to start med to help "shrink heart" Assessed pt's coping with her illness as well as spouse's-she is in better spirits today-saw therapist last week-states spouse doing & feeling better which makes  her feel better Reviewed med mgmt with pt-she has been ordered [facility] or Ozempic-denied by insurance-MD trying to getting approval overturned, pt was able to find new insurance for next year-switching to [employer] they will provide coverage for wgt loss meds Reviewed with pt referrals to sleep study for snoring-per chart review-sent to [facility] and referral "ready for  initial scheduling"-pt requested contact info and was given info, she has appt with nutritionist referral on 09/03/22  Patient Goals/Self-Care Activities: Participate in Transition of Care Program/Attend [facility] scheduled calls Take all medications as prescribed Attend all scheduled provider appointments Call provider office for new concerns or questions   call office if I gain more than 2 pounds in one day or 5 pounds in one week track weight in diary watch for swelling in feet, ankles and legs every day develop a rescue plan track symptoms and what helps feel better or worse Take wgt and BP log to all MD appts for provider to review Patient will complete sleep study Increase fluid intake per cardiologist recommendations  Follow Up Plan: Patient has completed 30 day TOC program and is being transferred to longitudinal CCM for continued education and support. Telephone follow up appointment with care management team member scheduled for:  08/14/23-3pm The patient has been provided with contact information for the care management team and has been advised to call with any health related questions or concerns.          Plan: The patient has been provided with contact information for the care management team and has been advised to call with any health related questions or concerns.  Follow up appt has been scheduled with assigned RN CM.  Antionette Fairy, RN,BSN,CCM RN Care Manager Transitions of Care  [facility]  Direct Phone: [phone] Toll Free: [phone] Fax: [phone]

## 2023-08-07 NOTE — Assessment & Plan Note (Signed)
Recent exacerbation with chest pain and hemoptysis, confirmed on ED evaluation Self-managed initially with extra diuretic dose Ongoing dyspnea and fatigue with minimal exertion Significantly impacting ability to perform work duties safely High risk for further decompensation with physical exertion  Continue current medications including furosemide Daily weight monitoring Strict sodium/fluid restrictions Regular vital sign monitoring Early follow-up for any weight gain >2 lbs/day

## 2023-08-07 NOTE — ED Triage Notes (Signed)
In for eval of chest pain and coughing up blood with SOB on Saturday. Was seen by PCP this am and sent to ED to R/O blood clots. Denies nausea or vomiting.

## 2023-08-07 NOTE — Assessment & Plan Note (Signed)
BMI 40-49.9 Attempting weight loss medication but facing insurance barriers Compounds cardiac symptoms and limitations Increases risk with physical activities

## 2023-08-07 NOTE — Patient Instructions (Signed)
Medications   Continue current cardiac medications Submit prior authorization for weight management medication Consider compound pharmacy options if coverage denied   Follow-up   Schedule cardiology follow-up Return to clinic in 1 week Continue VBCI care management support   Safety/Education   Review warning signs requiring immediate evaluation Provide clear activity limitations Reinforce medication compliance   Coordination   Referral to social work services Pharmacy benefits review Care management support continuation  It was a pleasure seeing you today! Your health and satisfaction are our top priorities.  Glenetta Hew, MD  Your Providers PCP: Lula Olszewski, MD,  [phone]) Referring Provider: Lula Olszewski, MD,  [phone]) Care Team Provider: Elder Negus, MD,  [phone]) Care Team Provider: Lynnell Catalan, MD,  [phone]) Care Team Provider: Drema Dallas, [place],  [phone]) Care Team Provider: Yates Decamp, MD,  [phone]) Care Team Provider: Elder Negus, MD,  [phone])     NEXT STEPS: [x]  Early Intervention: Schedule sooner appointment, call our on-call services, or go to emergency room if there is any significant Increase in pain or discomfort New or worsening symptoms Sudden or severe changes in your health [x]  Flexible Follow-Up: We recommend a No follow-ups on file. for optimal routine care. This allows for progress monitoring and treatment adjustments. [x]  Preventive Care: Schedule your annual preventive care visit! It's typically covered by insurance and helps identify potential health issues early. [x]  Lab & X-ray Appointments: Incomplete tests scheduled today, or call to schedule. X-rays: [facility] (M-F, 8:30am-noon or 1pm-5pm). [x]  Medical Information Release: Sign a release form at front desk to obtain relevant medical information we don't have.  MAKING THE MOST OF OUR  FOCUSED 20 MINUTE APPOINTMENTS: [x]   Clearly state your top concerns at the beginning of the visit to focus our discussion [x]   If you anticipate you will need more time, please inform the front desk during scheduling - we can book multiple appointments in the same week. [x]   If you have transportation problems- use our convenient video appointments or ask about transportation support. [x]   We can get down to business faster if you use MyChart to update information before the visit and submit non-urgent questions before your visit. Thank you for taking the time to provide details through MyChart.  Let our nurse know and she can import this information into your encounter documents.  Arrival and Wait Times: [x]   Arriving on time ensures that everyone receives prompt attention. [x]   Early morning (8a) and afternoon (1p) appointments tend to have shortest wait times. [x]   Unfortunately, we cannot delay appointments for late arrivals or hold slots during phone calls.  Getting Answers and Following Up [x]   Simple Questions & Concerns: For quick questions or basic follow-up after your visit, reach [place] at [phone] or MyChart messaging. [x]   Complex Concerns: If your concern is more complex, scheduling an appointment might be best. Discuss this with the staff to find the most suitable option. [x]   Lab & Imaging Results: We'll contact you directly if results are abnormal or you don't use MyChart. Most normal results will be on MyChart within 2-3 business days, with a review message from Dr. Jon Billings. Haven't heard back in 2 weeks? Need results sooner? Contact us at [phone]. [x]   Referrals: Our referral coordinator will manage specialist referrals. The specialist's office should contact you within 2 weeks to schedule an appointment. Call us if you haven't heard from them after 2 weeks.  Staying Connected [x]   MyChart: Activate your MyChart for the fastest way to access results and message [place]. See  the last page of this paperwork for instructions on how to activate.  Bring to Your Next Appointment [x]   Medications: Please bring all your medication bottles to your next appointment to ensure we have an accurate record of your prescriptions. [x]   Health Diaries: If you're monitoring any health conditions at home, keeping a diary of your readings can be very helpful for discussions at your next appointment.  Billing [x]   X-ray & Lab Orders: These are billed by separate companies. Contact the invoicing company directly for questions or concerns. [x]   Visit Charges: Discuss any billing inquiries with our administrative services team.  Your Satisfaction Matters [x]   Share Your Experience: We strive for your satisfaction! If you have any complaints, or preferably compliments, please let Dr. Jon Billings know directly or contact our Practice Administrators, Edwena Felty or [employer], by asking at the front desk.   Reviewing Your Records [x]   Review this early draft of your clinical encounter notes below and the final encounter summary tomorrow on MyChart after its been completed.  All orders placed so far are visible here: Acute on chronic diastolic CHF (congestive heart failure) (HCC) -     Ambulatory referral to Gastroenterology -     Semaglutide-Weight Management; Inject 0.25 mg into the skin once a week for 28 days.  Dispense: 2 mL; Refill: 0 -     Semaglutide-Weight Management; Inject 0.5 mg into the skin once a week for 28 days.  Dispense: 2 mL; Refill: 0 -     Semaglutide-Weight Management; Inject 1 mg into the skin once a week for 28 days.  Dispense: 2 mL; Refill: 0 -     Semaglutide-Weight Management; Inject 1.7 mg into the skin once a week for 28 days.  Dispense: 3 mL; Refill: 0 -     Semaglutide-Weight Management; Inject 2.4 mg into the skin once a week for 28 days.  Dispense: 3 mL; Refill: 0 -     AMB Referral VBCI Care Management  Hemoptysis  Screening for colon cancer  H/O  TIA (transient ischemic attack) and stroke -     Ambulatory referral to Gastroenterology -     Semaglutide-Weight Management; Inject 0.25 mg into the skin once a week for 28 days.  Dispense: 2 mL; Refill: 0 -     Semaglutide-Weight Management; Inject 0.5 mg into the skin once a week for 28 days.  Dispense: 2 mL; Refill: 0 -     Semaglutide-Weight Management; Inject 1 mg into the skin once a week for 28 days.  Dispense: 2 mL; Refill: 0 -     Semaglutide-Weight Management; Inject 1.7 mg into the skin once a week for 28 days.  Dispense: 3 mL; Refill: 0 -     Semaglutide-Weight Management; Inject 2.4 mg into the skin once a week for 28 days.  Dispense: 3 mL; Refill: 0 -     AMB Referral VBCI Care Management  Obesity, Class III, BMI 40-49.9 (morbid obesity) (HCC) -     Ambulatory referral to Gastroenterology -     Semaglutide-Weight Management; Inject 0.25 mg into the skin once a week for 28 days.  Dispense: 2 mL; Refill: 0 -     Semaglutide-Weight Management; Inject 0.5 mg into the skin once a week for 28 days.  Dispense: 2 mL; Refill: 0 -     Semaglutide-Weight Management; Inject 1 mg into the skin once a week  for 28 days.  Dispense: 2 mL; Refill: 0 -     Semaglutide-Weight Management; Inject 1.7 mg into the skin once a week for 28 days.  Dispense: 3 mL; Refill: 0 -     Semaglutide-Weight Management; Inject 2.4 mg into the skin once a week for 28 days.  Dispense: 3 mL; Refill: 0 -     AMB Referral VBCI Care Management  Material hardship due to limited financial resources -     Ambulatory referral to Gastroenterology -     Semaglutide-Weight Management; Inject 0.25 mg into the skin once a week for 28 days.  Dispense: 2 mL; Refill: 0 -     Semaglutide-Weight Management; Inject 0.5 mg into the skin once a week for 28 days.  Dispense: 2 mL; Refill: 0 -     Semaglutide-Weight Management; Inject 1 mg into the skin once a week for 28 days.  Dispense: 2 mL; Refill: 0 -     Semaglutide-Weight Management;  Inject 1.7 mg into the skin once a week for 28 days.  Dispense: 3 mL; Refill: 0 -     Semaglutide-Weight Management; Inject 2.4 mg into the skin once a week for 28 days.  Dispense: 3 mL; Refill: 0 -     AMB Referral VBCI Care Management  Hypertrophic cardiomyopathy (HCC)  Encounter for disability examination

## 2023-08-07 NOTE — Patient Instructions (Signed)
Visit Information  Thank you for taking time to visit with me today. It has been a pleasure talking to you over the past month. I am proud of the lifestyle changes you have been making to be healthier. Keep it up! Please don't hesitate to contact me if I can be of assistance to you in the future.   Your next appointment is by telephone on 08/14/23 at 3 pm  Following is a copy of your care plan:   Goals Addressed             This Visit's Progress    TOC Care Plan       /Current Barriers:  Knowledge Deficits related to plan of care for management of CHF   RNCM Clinical Goal(s):  Patient will work with the Care Management team over the next 30 days to address Transition of Care Barriers: CHF mgmt verbalize understanding of plan for management of CHF as evidenced by adherence to plan of care take all medications exactly as prescribed and will call provider for medication related questions as evidenced by med adherence/compliance demonstrate Improved health management independence as evidenced by no exacerbation of sxs or readmission within the next 30 days  through collaboration with RN Care manager, provider, and care team.   Interventions: Evaluation of current treatment plan related to  self management and patient's adherence to plan as established by provider   Heart Failure Interventions:  (Status:  Goal Met.) Short Term Goal Assessed for any changes in pt's condition/status-pt currently in ED for further eval of sxs she was having over the weekend per advisement from PCP Reviewed with pt upcoming appts and referrals-sleep study, nutrition, GI  Patient Goals/Self-Care Activities: Participate in Transition of Care Program/Attend TOC scheduled calls Take all medications as prescribed Attend all scheduled provider appointments Call provider office for new concerns or questions   call office if I gain more than 2 pounds in one day or 5 pounds in one week track weight in  diary watch for swelling in feet, ankles and legs every day develop a rescue plan track symptoms and what helps feel better or worse Take wgt and BP log to all MD appts for provider to review Patient will complete sleep study Increase fluid intake per cardiologist recommendations  Follow Up Plan: Patient has completed 30 day TOC program and is being transferred to longitudinal CCM for continued education and support. Telephone follow up appointment with care management team member scheduled for:  08/14/23-3pm The patient has been provided with contact information for the care management team and has been advised to call with any health related questions or concerns.          Patient verbalizes understanding of instructions and care plan provided today and agrees to view in MyChart. Active MyChart status and patient understanding of how to access instructions and care plan via MyChart confirmed with patient.     The patient has been provided with contact information for the care management team and has been advised to call with any health related questions or concerns.   Please call the care guide team at [phone] if you need to cancel or reschedule your appointment.   Please call the Suicide and Crisis Lifeline: 988 call the [place] National Suicide Prevention Lifeline: 830-019-7925 or TTY: 718-743-2440 TTY 9152938362) to talk to a trained counselor if you are experiencing a Mental Health or Behavioral Health Crisis or need someone to talk to.  Antionette Fairy, RN,BSN,CCM RN Care Manager Transitions  of Care  [place]-VBCI/Population Health  Direct Phone: [phone] Toll Free: [phone] Fax: [phone]

## 2023-08-08 ENCOUNTER — Telehealth: Payer: Self-pay | Admitting: *Deleted

## 2023-08-08 ENCOUNTER — Other Ambulatory Visit: Payer: Self-pay

## 2023-08-08 NOTE — Progress Notes (Signed)
Complex Care Management  Outreach Note  08/08/2023 Name: MAKAYLIA KOONE MRN: [id number] DOB: [birth year]   Complex Care Management Outreach Attempts: An unsuccessful telephone outreach was attempted today to offer the patient information about available complex care management services.  Follow Up Plan:  Additional outreach attempts will be made to offer the patient complex care management information and services.   Encounter Outcome:  No Answer  Burman Nieves, CCMA Care Coordination Care Guide Direct Dial: [phone]

## 2023-08-08 NOTE — Progress Notes (Signed)
Complex Care Management Note   08/08/2023 Name: Lydia Thompson MRN: [id number] DOB: February 06, 1976  Lydia Thompson is a 47 y.o. year old female who sees Lula Olszewski, MD for primary care. I reached out to Fransisca Kaufmann by phone today to offer complex care management services.  [name] was given information about Complex Care Management services today including:   The Complex Care Management services include support from the care team which includes your Nurse Coordinator, Clinical Social Worker, or Pharmacist.  The Complex Care Management team is here to help remove barriers to the health concerns and goals most important to you. Complex Care Management services are voluntary, and the patient may decline or stop services at any time by request to their care team member.   Complex Care Management Consent Status: Patient agreed to services and verbal consent obtained.   Follow up plan:  Telephone appointment with complex care management team member scheduled for:  08/18/2023  Encounter Outcome:  Patient Scheduled  Burman Nieves, [facility] Care Coordination Care Guide Direct Dial: [phone]

## 2023-08-10 ENCOUNTER — Other Ambulatory Visit: Payer: Self-pay

## 2023-08-11 ENCOUNTER — Encounter: Payer: Self-pay | Admitting: Internal Medicine

## 2023-08-11 DIAGNOSIS — R9431 Abnormal electrocardiogram [ECG] [EKG]: Secondary | ICD-10-CM | POA: Insufficient documentation

## 2023-08-14 ENCOUNTER — Other Ambulatory Visit: Payer: Self-pay

## 2023-08-14 ENCOUNTER — Ambulatory Visit: Payer: Self-pay

## 2023-08-14 NOTE — Patient Outreach (Signed)
  Care Coordination   08/14/2023 Name: Lydia Thompson MRN: [id number] DOB: [birth year]   Care Coordination Outreach Attempts:  An unsuccessful outreach was attempted for an appointment today.  Follow Up Plan:  Additional outreach attempts will be made to offer the patient complex care management information and services.   Encounter Outcome:  No Answer   Care Coordination Interventions:  No, not indicated    Elona Yinger [name], RN, MSN RN Care Manager [facility], Population Health Direct Dial: [phone]  Fax: [phone] Website: [email or web address]

## 2023-08-15 ENCOUNTER — Other Ambulatory Visit: Payer: Self-pay

## 2023-08-17 ENCOUNTER — Other Ambulatory Visit: Payer: Self-pay

## 2023-08-17 ENCOUNTER — Telehealth: Payer: Self-pay | Admitting: Internal Medicine

## 2023-08-17 ENCOUNTER — Ambulatory Visit: Payer: 59 | Admitting: Internal Medicine

## 2023-08-17 NOTE — Telephone Encounter (Signed)
Patient would like a call back to see if she needed a follow up appt

## 2023-08-18 ENCOUNTER — Ambulatory Visit: Payer: Self-pay | Admitting: Licensed Clinical Social Worker

## 2023-08-18 ENCOUNTER — Other Ambulatory Visit: Payer: Self-pay

## 2023-08-18 NOTE — Patient Outreach (Signed)
  Care Coordination   08/18/2023 Name: Lydia Thompson MRN: [id number] DOB: 1976/05/19   Care Coordination Outreach Attempts:  An unsuccessful outreach was attempted for an appointment today.  Follow Up Plan:  Additional outreach attempts will be made to offer the patient complex care management information and services.   Encounter Outcome:  No Answer   Care Coordination Interventions:  No, not indicated    Jeanie Cooks, PhD [facility], [facility] Social Worker Direct Dial: [phone]  Fax: [phone]

## 2023-08-21 ENCOUNTER — Other Ambulatory Visit (HOSPITAL_COMMUNITY): Payer: Self-pay

## 2023-08-21 ENCOUNTER — Other Ambulatory Visit: Payer: Self-pay

## 2023-08-22 ENCOUNTER — Other Ambulatory Visit: Payer: Self-pay

## 2023-08-24 ENCOUNTER — Other Ambulatory Visit: Payer: Self-pay

## 2023-08-24 NOTE — Telephone Encounter (Signed)
 Copied from CRM [id number]. Topic: Clinical - Medication Question >> Aug 24, 2023 11:09 AM Zacyrah J wrote: Reason for CRM: pt would need PA on medication Semaglutide -Weight Management 2.4 MG/0.75ML SOAJ   Sent message to prior authorization team to start PA for this medication.

## 2023-08-25 ENCOUNTER — Other Ambulatory Visit: Payer: Self-pay

## 2023-08-25 ENCOUNTER — Other Ambulatory Visit (HOSPITAL_COMMUNITY): Payer: Self-pay

## 2023-08-25 ENCOUNTER — Telehealth: Payer: Self-pay

## 2023-08-25 NOTE — Patient Outreach (Signed)
  Care Coordination   08/25/2023 Name: Lydia Thompson MRN: [id number] DOB: Jan 07, 1976   Care Coordination Outreach Attempts:  A second unsuccessful outreach was attempted today to offer the patient with information about available complex care management services.  Follow Up Plan:  Additional outreach attempts will be made to offer the patient complex care management information and services.   Encounter Outcome:  No Answer   Care Coordination Interventions:  No, not indicated    Mariajose Mow J Jeramey Lanuza, RN, MSN RN Care Manager [facility], Population Health Direct Dial: [phone]  Fax: [phone] Website: [email or web address]

## 2023-08-28 ENCOUNTER — Other Ambulatory Visit: Payer: Self-pay

## 2023-08-29 ENCOUNTER — Telehealth: Payer: Self-pay | Admitting: *Deleted

## 2023-08-29 ENCOUNTER — Other Ambulatory Visit: Payer: Self-pay

## 2023-08-29 ENCOUNTER — Ambulatory Visit (INDEPENDENT_AMBULATORY_CARE_PROVIDER_SITE_OTHER): Payer: 59 | Admitting: Neurology

## 2023-08-29 ENCOUNTER — Other Ambulatory Visit: Payer: Self-pay | Admitting: Internal Medicine

## 2023-08-29 ENCOUNTER — Telehealth: Payer: Self-pay

## 2023-08-29 ENCOUNTER — Encounter: Payer: Self-pay | Admitting: Neurology

## 2023-08-29 ENCOUNTER — Ambulatory Visit: Payer: Self-pay | Admitting: Licensed Clinical Social Worker

## 2023-08-29 ENCOUNTER — Other Ambulatory Visit (HOSPITAL_COMMUNITY): Payer: Self-pay

## 2023-08-29 VITALS — BP 122/80 | HR 80 | Ht 62.0 in | Wt 230.0 lb

## 2023-08-29 DIAGNOSIS — R519 Headache, unspecified: Secondary | ICD-10-CM

## 2023-08-29 DIAGNOSIS — N951 Menopausal and female climacteric states: Secondary | ICD-10-CM

## 2023-08-29 DIAGNOSIS — Z9189 Other specified personal risk factors, not elsewhere classified: Secondary | ICD-10-CM

## 2023-08-29 DIAGNOSIS — Z8673 Personal history of transient ischemic attack (TIA), and cerebral infarction without residual deficits: Secondary | ICD-10-CM | POA: Diagnosis not present

## 2023-08-29 DIAGNOSIS — G4719 Other hypersomnia: Secondary | ICD-10-CM

## 2023-08-29 DIAGNOSIS — R0683 Snoring: Secondary | ICD-10-CM

## 2023-08-29 DIAGNOSIS — I422 Other hypertrophic cardiomyopathy: Secondary | ICD-10-CM

## 2023-08-29 DIAGNOSIS — R351 Nocturia: Secondary | ICD-10-CM

## 2023-08-29 DIAGNOSIS — I5032 Chronic diastolic (congestive) heart failure: Secondary | ICD-10-CM

## 2023-08-29 MED ORDER — VEOZAH 45 MG PO TABS
1.0000 | ORAL_TABLET | Freq: Every day | ORAL | 11 refills | Status: DC
  Filled 2023-08-29: qty 30, 30d supply, fill #0

## 2023-08-29 NOTE — Telephone Encounter (Signed)
-----   Message from [facility] Tiffany L sent at 08/24/2023 11:34 AM EST ----- Regarding: PA for [facility] Hey, please start PA for Semaglutide-Weight Management 2.4 MG/0.75ML SOAJ for patient. Thank you.

## 2023-08-29 NOTE — Patient Instructions (Signed)
 Visit Information  Thank you for taking time to visit with me today. Please don't hesitate to contact me if I can be of assistance to you.   Following are the goals we discussed today:   Goals Addressed             This Visit's Progress    Care Coordination Activities       Care Coordination Interventions: Patient stated that she has lots of stress due to her husband being sick and herself, she stated that it was a lot and that she has to miss work to take him to appointments but her job understands, The SW educated and offered to assist with a SCAT application, but the patient stated that would rather take her husband to his appointments because he does not always take in the information correctly from his appointments. Patient stated that she works and has never applied for [employer] benefits but would like to see if she can be approved, SW will also send the link and the food pantry resources via email to the patient. Patient also wants to be referred to a LCSW for stress and depression to life issue and to a RN to talk about health related issues. SW will follow up on 09/12/2023 at 2:45 pm         Our next appointment is by telephone on 09/12/2023 at 2:45 pm  Please call the care guide team at [phone] if you need to cancel or reschedule your appointment.   If you are experiencing a Mental Health or Behavioral Health Crisis or need someone to talk to, please call the Suicide and Crisis Lifeline: 988 go to [facility] Urgent [facility] [address], [place] [phone]) call 911  Patient verbalizes understanding of instructions and care plan provided today and agrees to view in MyChart. Active MyChart status and patient understanding of how to access instructions and care plan via MyChart confirmed with patient.     Tobias CHARM Maranda HEDWIG, PhD [facility], [facility] Social Worker Direct Dial: [phone]  Fax:  [phone]

## 2023-08-29 NOTE — Patient Outreach (Signed)
  Care Coordination   Initial Visit Note   08/29/2023 Name: Lydia Thompson MRN: [id number] DOB: 05/05/1976  Lydia Thompson is a 48 y.o. year old female who sees Jesus Bernardino MATSU, MD for primary care. I spoke with  Ellouise CHRISTELLA Gilmore by phone today.  What matters to the patients health and wellness today?  Food Insecurities SDOH    Goals Addressed             This Visit's Progress    Care Coordination Activities       Care Coordination Interventions: Patient stated that she has lots of stress due to her husband being sick and herself, she stated that it was a lot and that she has to miss work to take him to appointments but her job understands, The SW educated and offered to assist with a SCAT application, but the patient stated that would rather take her husband to his appointments because he does not always take in the information correctly from his appointments. Patient stated that she works and has never applied for [employer] benefits but would like to see if she can be approved, SW will also send the link and the food pantry resources via email to the patient. Patient also wants to be referred to a LCSW for stress and depression to life issue and to a RN to talk about health related issues. SW will follow up on 09/12/2023 at 2:45 pm         SDOH assessments and interventions completed:  Yes  SDOH Interventions Today    Flowsheet Row Most Recent Value  SDOH Interventions   Food Insecurity Interventions Other (Comment)  [Apply for SNAP and will send Food Pantry resources and educated on GGFF]  Housing Interventions Intervention Not Indicated  Transportation Interventions Intervention Not Indicated  Utilities Interventions Intervention Not Indicated        Care Coordination Interventions:  Yes, provided  Interventions Today    Flowsheet Row Most Recent Value  General Interventions   General Interventions Discussed/Reviewed Community Resources  [Food Pantry resources]         Follow up plan: Follow up call scheduled for 09/12/2023 at 2:45 pm    Encounter Outcome:  Patient Visit Completed   Tobias CHARM Maranda HEDWIG, PhD [facility], [facility] Social Worker Direct Dial: [phone]  Fax: [phone]

## 2023-08-29 NOTE — Progress Notes (Signed)
 Subjective:    Patient ID: Lydia Thompson is a 48 y.o. female.  HPI    True Mar, MD, PhD [facility] [address] [address] [place], [place] [address]  Dear Dr. Jesus,  I saw your patient, Lydia Thompson, upon your kind request in my sleep clinic today for initial consultation of her sleep disorder, in particular, concern for underlying obstructive sleep apnea.  The patient is unaccompanied today.  As you know, Lydia Thompson is a 48 year old female with an underlying complex medical history of chronic diastolic congestive heart failure, coronary artery disease with history of MI, hypertrophic cardiomyopathy, hypertension, hypokalemia, anxiety, history of TIA (for which she saw Dr. Skeet Finn neurology), and severe obesity with a BMI of over 40, who reports snoring and excessive daytime somnolence, as well as difficulty falling asleep.  Her Epworth sleepiness score is 13 out of 24, fatigue severity score is 52 out of 63.  I reviewed your office note from 07/24/2023.  She lives with her husband.  She has occasional morning headaches.  She has nocturia about 4-5 times per average night.  She takes Lasix , sometimes she has to take extra Lasix  if she has fluid retention, she has to weigh herself daily.  She recently finished her 2-week heart monitor.  Her weight has been fluctuating.  She is not aware of any family history of sleep apnea.  She works as a LAWYER, as an medical illustrator.  Bedtime is generally around midnight and rise time around 6 AM.  She lives with her husband and 74 year old stepson.  They do have a TV on in their bedroom her husband's preference.  She has 3 dogs in the household, they typically do not sleep in their bedroom at night.  Rise time is around 6 AM.  She occasionally takes melatonin at night which is helpful.  She drinks caffeine in the form of soda, about 3 cans or 3 bottles per day, regular soda.  She does not drink any alcohol.  She is a  non-smoker.  Her Past Medical History Is Significant For: Past Medical History:  Diagnosis Date   Anxiety    Chest pain of uncertain etiology 09/26/2017   CHF (congestive heart failure) (HCC)    Hot flashes, menopausal 08/30/2022   Tried OTC medications without help   Hypertension    Low libido 08/30/2022    Her Past Surgical History Is Significant For: Past Surgical History:  Procedure Laterality Date   ABDOMINAL HYSTERECTOMY  08/23/2003   dermoid cyst    Her Family History Is Significant For: Family History  Problem Relation Age of Onset   Cancer Mother 20       bone cancer   Hypertension Mother    Other Sister        Pipolar   Heart disease Paternal Uncle    Arthritis Paternal Uncle    Asthma Paternal Uncle    Hypertension Paternal Uncle    Cancer Maternal Grandmother        Lung cancer   Sleep apnea Neg Hx     Her Social History Is Significant For: Social History   Socioeconomic History   Marital status: Single    Spouse name: Not on file   Number of children: 0   Years of education: Not on file   Highest education level: 12th grade  Occupational History   Not on file  Tobacco Use   Smoking status: Never   Smokeless tobacco: Never  Vaping  Use   Vaping status: Never Used  Substance and Sexual Activity   Alcohol use: Yes    Comment: 1 glass of wine every 3-4 months   Drug use: No   Sexual activity: Yes    Birth control/protection: Surgical, None    Comment: Hyst  Other Topics Concern   Not on file  Social History Narrative   Not on file   Social Drivers of Health   Financial Resource Strain: Medium Risk (08/03/2023)   Overall Financial Resource Strain (CARDIA)    Difficulty of Paying Living Expenses: Somewhat hard  Food Insecurity: Patient Declined (08/03/2023)   Hunger Vital Sign    Worried About Running Out of Food in the Last Year: Patient declined    Ran Out of Food in the Last Year: Patient declined  Transportation Needs: No  Transportation Needs (08/03/2023)   PRAPARE - Administrator, Civil Service (Medical): No    Lack of Transportation (Non-Medical): No  Physical Activity: Insufficiently Active (08/03/2023)   Exercise Vital Sign    Days of Exercise per Week: 2 days    Minutes of Exercise per Session: 30 min  Stress: Stress Concern Present (08/03/2023)   [employer] of Occupational Health - Occupational Stress Questionnaire    Feeling of Stress : Very much  Social Connections: Socially Integrated (08/03/2023)   Social Connection and Isolation Panel [NHANES]    Frequency of Communication with Friends and Family: More than three times a week    Frequency of Social Gatherings with Friends and Family: More than three times a week    Attends Religious Services: 1 to 4 times per year    Active Member of [employer] or Organizations: Yes    Attends Banker Meetings: 1 to 4 times per year    Marital Status: Married    Her Allergies Are:  No Known Allergies:   Her Current Medications Are:  Outpatient Encounter Medications as of 08/29/2023  Medication Sig   aspirin  EC 81 MG tablet Take 1 tablet (81 mg total) by mouth daily. Swallow whole.   benzonatate  (TESSALON  PERLES) 100 MG capsule Take 1 capsule (100 mg total) by mouth 3 (three) times daily as needed for cough.   diltiazem  (CARDIZEM  CD) 120 MG 24 hr capsule Take 1 capsule (120 mg total) by mouth daily.   DULoxetine  (CYMBALTA ) 60 MG capsule Take 1 capsule (60 mg total) by mouth daily. Replaces 30 mg dose   estradiol  (ESTRACE ) 0.1 MG/GM vaginal cream Limit use to minimum amount to control symptoms. (Patient taking differently: Place 1 Applicatorful vaginally as needed (dryness).)   furosemide  (LASIX ) 20 MG tablet Take 1 tablet (20 mg total) by mouth 2 (two) times daily.   LORazepam  (ATIVAN ) 0.5 MG tablet Take 1 tablet (0.5 mg total) by mouth 2 (two) times daily as needed for anxiety.   metoprolol  succinate (TOPROL  XL) 100 MG 24 hr tablet  Take 1 tablet (100 mg total) by mouth daily. Take with or immediately following a meal.   spironolactone  (ALDACTONE ) 25 MG tablet Take 1 tablet (25 mg total) by mouth daily.   Semaglutide -Weight Management 0.25 MG/0.5ML SOAJ Inject 0.25 mg into the skin once a week for 28 days. (Patient not taking: Reported on 08/29/2023)   [START ON 09/05/2023] Semaglutide -Weight Management 0.5 MG/0.5ML SOAJ Inject 0.5 mg into the skin once a week for 28 days. (Patient not taking: Reported on 08/29/2023)   [START ON 10/04/2023] Semaglutide -Weight Management 1 MG/0.5ML SOAJ Inject 1 mg into the  skin once a week for 28 days. (Patient not taking: Reported on 08/29/2023)   [START ON 11/02/2023] Semaglutide -Weight Management 1.7 MG/0.75ML SOAJ Inject 1.7 mg into the skin once a week for 28 days. (Patient not taking: Reported on 08/29/2023)   [START ON 12/01/2023] Semaglutide -Weight Management 2.4 MG/0.75ML SOAJ Inject 2.4 mg into the skin once a week for 28 days. (Patient not taking: Reported on 08/29/2023)   [DISCONTINUED] diltiazem  (DILACOR XR ) 180 MG 24 hr capsule Take 1 capsule (180 mg total) by mouth daily.   [DISCONTINUED] isosorbide  mononitrate (IMDUR ) 30 MG 24 hr tablet Take 1 tablet (30 mg total) by mouth daily.   No facility-administered encounter medications on file as of 08/29/2023.  :   Review of Systems:  Out of a complete 14 point review of systems, all are reviewed and negative with the exception of these symptoms as listed below:  Review of Systems  Neurological:        Pt is here for sleep consult. No previous SS. Pt reports HTN, headaches,  snoring and feels fatigued during the day. States has trouble falling asleep. ESS 13, FSS 52    Objective:  Neurological Exam  Physical Exam Physical Examination:   Vitals:   08/29/23 1013  BP: 122/80  Pulse: 80    General Examination: The patient is a very pleasant 48 y.o. female in no acute distress. She appears well-developed and well-nourished and well  groomed.   HEENT: Normocephalic, atraumatic, pupils are equal, round and reactive to light, extraocular tracking is good without limitation to gaze excursion or nystagmus noted. Hearing is grossly intact. Face is symmetric with normal facial animation. Speech is clear with no dysarthria noted. There is no hypophonia. There is no lip, neck/head, jaw or voice tremor. Neck is supple with full range of passive and active motion. There are no carotid bruits on auscultation. Oropharynx exam reveals: mild mouth dryness, good dental hygiene and moderate airway crowding, due to Mallampati class III, tonsillar size of 3+ on the right and 2+ on the left, neck circumference 14 three-quarter inches, minimal overbite noted, slight crossbite.  Tongue protrudes centrally and palate elevates symmetrically.   Chest: Clear to auscultation without wheezing, rhonchi or crackles noted.  Heart: S1+S2+0, regular with a prominent 4 out of 6 pansystolic heart murmur noted.    Abdomen: Soft, non-tender and non-distended.  Extremities: There is no pitting edema in the distal lower extremities bilaterally.   Skin: Warm and dry without trophic changes noted.   Musculoskeletal: exam reveals no obvious joint deformities.   Neurologically:  Mental status: The patient is awake, alert and oriented in all 4 spheres. Her immediate and remote memory, attention, language skills and fund of knowledge are appropriate. There is no evidence of aphasia, agnosia, apraxia or anomia. Speech is clear with normal prosody and enunciation. Thought process is linear. Mood is normal and affect is normal.  Cranial nerves II - XII are as described above under HEENT exam.  Motor exam: Normal bulk, strength and tone is noted. There is no obvious action or resting tremor.  Fine motor skills and coordination: grossly intact.  Cerebellar testing: No dysmetria or intention tremor. There is no truncal or gait ataxia.  Sensory exam: intact to light touch  in the upper and lower extremities.  Gait, station and balance: She stands easily. No veering to one side is noted. No leaning to one side is noted. Posture is age-appropriate and stance is narrow based. Gait shows normal stride length and normal  pace. No problems turning are noted.   Assessment and Plan:  In summary, Lydia Thompson is a very pleasant 48 y.o.-year old female with an underlying complex medical history of chronic diastolic congestive heart failure, coronary artery disease with history of MI, hypertrophic cardiomyopathy, hypertension, hypokalemia, anxiety, history of TIA (for which she saw Dr. Skeet Finn neurology), and severe obesity with a BMI of over 40, whose history and physical exam are concerning for sleep disordered breathing, particularly obstructive sleep apnea (OSA). A laboratory attended sleep study is typically considered gold standard for evaluation of sleep disordered breathing.   I had a long chat with the patient about my findings and the diagnosis of sleep apnea, particularly OSA, its prognosis and treatment options. We talked about medical/conservative treatments, surgical interventions and non-pharmacological approaches for symptom control. I explained, in particular, the risks and ramifications of untreated moderate to severe OSA, especially with respect to developing cardiovascular disease down the road, including congestive heart failure (CHF), difficult to treat hypertension, cardiac arrhythmias (particularly A-fib), neurovascular complications including TIA, stroke and dementia. Even type 2 diabetes has, in part, been linked to untreated OSA. Symptoms of untreated OSA may include (but may not be limited to) daytime sleepiness, nocturia (i.e. frequent nighttime urination), memory problems, mood irritability and suboptimally controlled or worsening mood disorder such as depression and/or anxiety, lack of energy, lack of motivation, physical discomfort, as well as  recurrent headaches, especially morning or nocturnal headaches. We talked about the importance of maintaining a healthy lifestyle and striving for healthy weight. In addition, we talked about the importance of striving for and maintaining good sleep hygiene. I recommended a sleep study at this time. I outlined the differences between a laboratory attended sleep study which is considered more comprehensive and accurate over the option of a home sleep test (HST); the latter may lead to underestimation of sleep disordered breathing in some instances and does not help with diagnosing upper airway resistance syndrome and is not accurate enough to diagnose primary central sleep apnea typically. I outlined possible surgical and non-surgical treatment options of OSA, including the use of a positive airway pressure (PAP) device (i.e. CPAP, AutoPAP/APAP or BiPAP in certain circumstances), a custom-made dental device (aka oral appliance, which would require a referral to a specialist dentist or orthodontist typically, and is generally speaking not considered for patients with full dentures or edentulous state), upper airway surgical options, such as traditional UPPP (which is not considered a first-line treatment) or the Inspire device (hypoglossal nerve stimulator, which would involve a referral for consultation with an ENT surgeon, after careful selection, following inclusion criteria - also not first-line treatment). I explained the PAP treatment option to the patient in detail, as this is generally considered first-line treatment.  The patient indicated that she would be willing to try PAP therapy, if the need arises. I explained the importance of being compliant with PAP treatment, not only for insurance purposes but primarily to improve patient's symptoms symptoms, and for the patient's long term health benefit, including to reduce Her cardiovascular risks longer-term.    We will pick up our discussion about the next  steps and treatment options after testing.  We will keep her posted as to the test results by phone call and/or MyChart messaging where possible.  We will plan to follow-up in sleep clinic accordingly as well.  I answered all her questions today and the patient was in agreement.   I encouraged her to call with any interim questions,  concerns, problems or updates or email us  through MyChart.  Generally speaking, sleep test authorizations may take up to 2 weeks, sometimes less, sometimes longer, the patient is encouraged to get in touch with us  if they do not hear back from the sleep lab staff directly within the next 2 weeks.  Thank you very much for allowing me to participate in the care of this nice patient. If I can be of any further assistance to you please do not hesitate to call me at [phone].  Sincerely,   True Mar, MD, PhD

## 2023-08-29 NOTE — Progress Notes (Signed)
 Complex Care Management Care Guide Note  08/29/2023 Name: [name] MRN: [id number] DOB: [birth year]  Lydia Thompson is a 48 y.o. year old female who is a primary care patient of Jesus Bernardino MATSU, MD and is actively engaged with the care management team. I reached out to Ellouise CHRISTELLA Gilmore by phone today to assist with re-scheduling  with the BSW.  Follow up plan: Unsuccessful telephone outreach attempt made. A HIPAA compliant phone message was left for the patient providing contact information and requesting a return call.  Thedford Franks, CCMA Care Coordination Care Guide Direct Dial: [phone]

## 2023-08-29 NOTE — Progress Notes (Signed)
 Complex Care Management Care Guide Note  08/29/2023 Name: [name] MRN: [id number] DOB: March 04, 1976  Lydia Thompson is a 48 y.o. year old female who is a primary care patient of Jesus Bernardino MATSU, MD and is actively engaged with the care management team. I reached out to Ellouise CHRISTELLA Gilmore by phone today to assist with scheduling  with the RN Case Manager Licensed Clinical Social Worker.  Follow up plan: Unsuccessful telephone outreach attempt made. A HIPAA compliant phone message was left for the patient providing contact information and requesting a return call.  [facility]  Care Coordination Care Guide  Direct Dial: [phone]

## 2023-08-29 NOTE — Telephone Encounter (Signed)
 error

## 2023-08-29 NOTE — Patient Instructions (Signed)

## 2023-08-29 NOTE — Telephone Encounter (Signed)
 I have submitted a prior authorization for Wegovy 0.25 mg today. Patient does not need it for the 2.4 mg dose, she requested the wrong dose previously.

## 2023-08-30 ENCOUNTER — Telehealth: Payer: Self-pay | Admitting: Pharmacist

## 2023-08-30 ENCOUNTER — Telehealth: Payer: Self-pay

## 2023-08-30 ENCOUNTER — Other Ambulatory Visit: Payer: Self-pay

## 2023-08-30 NOTE — Patient Outreach (Signed)
  Care Coordination   08/30/2023 Name: Lydia Thompson MRN: [id number] DOB: 10/08/1975   Care Coordination Outreach Attempts:  A third unsuccessful outreach was attempted today to offer the patient with information about available complex care management services.  Follow Up Plan:  No further outreach attempts will be made at this time. We have been unable to contact the patient to offer or enroll patient in complex care management services.  Encounter Outcome:  No Answer   Care Coordination Interventions:  No, not indicated    Cloria Ciresi J Amit Leece, RN, MSN RN Care Manager [facility], Population Health Direct Dial: [phone]  Fax: [phone] Website: [email or web address]

## 2023-08-30 NOTE — Progress Notes (Signed)
 Pt rescheduled by BSW yesterday for 09/12/2023 - pt returned call and confirmed

## 2023-08-30 NOTE — Telephone Encounter (Signed)
 Additional information was requested by patient's new insurance.  Form was completed and faxed back to [phone] on 08/30/2023 @10am .  Neurology noted possible OSA-if confirmed with a sleep test, could consider Zepbound  for patient if clinically appropriate and/or if Wegovy  is denied.  Thank you, Devere Pandy, PharmD Clinical Pharmacist  [facility]  Direct Dial: [phone]

## 2023-09-01 ENCOUNTER — Ambulatory Visit: Payer: Self-pay | Admitting: Cardiology

## 2023-09-01 ENCOUNTER — Other Ambulatory Visit: Payer: Self-pay

## 2023-09-04 ENCOUNTER — Telehealth: Payer: Self-pay | Admitting: Neurology

## 2023-09-04 ENCOUNTER — Other Ambulatory Visit (HOSPITAL_COMMUNITY): Payer: Self-pay

## 2023-09-04 ENCOUNTER — Encounter: Payer: 59 | Attending: Internal Medicine | Admitting: Skilled Nursing Facility1

## 2023-09-04 ENCOUNTER — Encounter: Payer: Self-pay | Admitting: Skilled Nursing Facility1

## 2023-09-04 VITALS — Ht 63.0 in | Wt 231.1 lb

## 2023-09-04 DIAGNOSIS — I10 Essential (primary) hypertension: Secondary | ICD-10-CM | POA: Diagnosis present

## 2023-09-04 NOTE — Progress Notes (Signed)
 Medical Nutrition Therapy  Appointment Start time:  9:26  Appointment End time:  10:45  Primary concerns today: health eating with CHF  Referral diagnosis: i10 Preferred learning style: auditory Learning readiness: contemplating   NUTRITION ASSESSMENT    Clinical Medical Hx: HF Medications: see list Labs: see labs Notable Signs/Symptoms: shortness of breath if eats a heavy meal   Lifestyle & Dietary Hx  Pt states he has been trying to take better care of herself since 2018. Pt states she takes an extra fluid pill when weighing over 230 pounds. Pt states she does have support in her friend. Pt states she thinks what will motivate her is not being out of breath all the time. Pt states her husband is a investment banker, operational. Pt states she does stress eat. Pt states she does do therapy which she finds helpful.   Body Composition Scale 09/04/2023  Current Body Weight 231.1  Total Body Fat % 44  Visceral Fat 15  Fat-Free Mass % 55.9   Total Body Water % 42.4  Muscle-Mass lbs 30.8  BMI 40.5  Body Fat Displacement          Torso  lbs 63         Left Leg  lbs 12.6         Right Leg  lbs 12.6         Left Arm  lbs 6.3         Right Arm   lbs 6.3     Estimated daily fluid intake: pt states her doctor advised she get 101 oz in phsycians note recommended 2-3L per day  Supplements: none reported  Sleep: struggles to fall asleep  Stress / self-care: currently high due to own health condition, her husbands late stage lung cancer, in the processes of adopting her husbands 100 year old son, unsupportive/overly critical husband Current average weekly physical activity: ADL's  24-Hr Dietary Recall First Meal: flax seed, lemon, water, honey, Flavoring and skipped Snack:  Second Meal: 2 grilled cheeses  Snack:  Third Meal: fast food or baked chicken + pintos + mac n cheese  Snack: flax seed, lemon, water, honey, Flavoring  Beverages: soda  Estimated Energy Needs Calories: 1600-1900   NUTRITION  DIAGNOSIS  Imbalanced nutrition: more than body requirements related to excessive calorie intake and low physical activity as evidenced by obesity (BMI >=30) and 24 hr recall  NUTRITION INTERVENTION  Nutrition education (E-1) on the following topics:  Educated pt on a balanced diet within the context of CHF episodes Educated pt on the importance of reaching physical activity recommendations as prescribed by her cardiologist  Why physical activity is needed:  Physical activity has various benefits for the body such as reducing stress, helping control blood glucose levels, strengthening the heart, reducing incidence of cognitive impairment, and has a neuroprotective effect on memory function. Research reports that physical activity (whether it be aerobic, resistance training, or high intensity interval training) help control the level of reactive oxygen species which can lead to oxidative stress on the brain therefor possibly impairing memory, learning abilities, and other cognitive functions. The CDC recognizes that physical activity can help prevent premature death, may prevent the development of type 2 diabetes, different cancers, and heart disease. Not only are there are health benefits to physical activity, the CDC also reports that physical activity would decrease health care costs, increase property values, and people who are physically active generally have less sick days. Resource https://www-sciencedirect-com.libproxy.Marathonskier.co.uk, CDC  Handouts Provided Include  Detailed MyPlate Use of AI for recipe creation and use of ingredients already in the home  Learning Style & Readiness for Change Teaching method utilized: Visual & Auditory  Demonstrated degree of understanding via: Teach Back  Barriers to learning/adherence to lifestyle change: pt states her husband is not supportive and overly criticizes her cooking and cleaning   Goals Established by Pt I will  sleep in the living room at 9:30 I will meditate for 5 minutes after the shower    MONITORING & EVALUATION Dietary intake, weekly physical activity  Next Steps  Patient is to follow up in center in 4-6 weeks.

## 2023-09-04 NOTE — Telephone Encounter (Signed)
 I stared case on 09/01/23 NPSG Oscar pending.

## 2023-09-05 ENCOUNTER — Other Ambulatory Visit: Payer: Self-pay

## 2023-09-05 NOTE — Telephone Encounter (Signed)
 NPSG Tasia Catchings: [id number] (exp. 09/01/23 to 11/30/23)

## 2023-09-06 ENCOUNTER — Encounter: Payer: Self-pay | Admitting: Internal Medicine

## 2023-09-06 ENCOUNTER — Ambulatory Visit: Payer: 59 | Admitting: Internal Medicine

## 2023-09-06 ENCOUNTER — Other Ambulatory Visit (HOSPITAL_COMMUNITY): Payer: Self-pay

## 2023-09-06 ENCOUNTER — Other Ambulatory Visit: Payer: Self-pay

## 2023-09-06 VITALS — BP 113/69 | HR 73 | Temp 97.2°F | Ht 63.0 in | Wt 231.6 lb

## 2023-09-06 DIAGNOSIS — I2721 Secondary pulmonary arterial hypertension: Secondary | ICD-10-CM

## 2023-09-06 DIAGNOSIS — F419 Anxiety disorder, unspecified: Secondary | ICD-10-CM

## 2023-09-06 DIAGNOSIS — I471 Supraventricular tachycardia, unspecified: Secondary | ICD-10-CM

## 2023-09-06 DIAGNOSIS — I5189 Other ill-defined heart diseases: Secondary | ICD-10-CM

## 2023-09-06 DIAGNOSIS — I422 Other hypertrophic cardiomyopathy: Secondary | ICD-10-CM

## 2023-09-06 DIAGNOSIS — N83201 Unspecified ovarian cyst, right side: Secondary | ICD-10-CM

## 2023-09-06 DIAGNOSIS — N951 Menopausal and female climacteric states: Secondary | ICD-10-CM

## 2023-09-06 DIAGNOSIS — Z8673 Personal history of transient ischemic attack (TIA), and cerebral infarction without residual deficits: Secondary | ICD-10-CM

## 2023-09-06 DIAGNOSIS — R232 Flushing: Secondary | ICD-10-CM | POA: Insufficient documentation

## 2023-09-06 DIAGNOSIS — I5032 Chronic diastolic (congestive) heart failure: Secondary | ICD-10-CM

## 2023-09-06 DIAGNOSIS — I10 Essential (primary) hypertension: Secondary | ICD-10-CM

## 2023-09-06 MED ORDER — RYBELSUS 3 MG PO TABS
3.0000 mg | ORAL_TABLET | Freq: Every day | ORAL | 2 refills | Status: DC
  Filled 2023-09-06 – 2023-09-18 (×2): qty 30, 30d supply, fill #0

## 2023-09-06 MED ORDER — OZEMPIC (0.25 OR 0.5 MG/DOSE) 2 MG/3ML ~~LOC~~ SOPN
0.2500 mg | PEN_INJECTOR | SUBCUTANEOUS | 11 refills | Status: DC
  Filled 2023-09-06: qty 3, 30d supply, fill #0

## 2023-09-06 MED ORDER — GABAPENTIN 300 MG PO CAPS
300.0000 mg | ORAL_CAPSULE | Freq: Three times a day (TID) | ORAL | 3 refills | Status: DC
  Filled 2023-09-06: qty 90, 30d supply, fill #0
  Filled 2023-10-06 – 2023-10-30 (×3): qty 90, 30d supply, fill #1

## 2023-09-06 NOTE — Assessment & Plan Note (Signed)
 Obesity Weight loss medications were discussed, facing insurance barriers. Options include compounded tirzepatide , Wegovy , and potentially covered Mounjaro . Weight loss could significantly improve overall health and reduce cardiac strain. Recommend compounded tirzepatide  or Wegovy , order Mounjaro  if covered by insurance, and encourage weight loss for overall health improvement.

## 2023-09-06 NOTE — Assessment & Plan Note (Signed)
 Anxiety Anxiety fluctuates and is currently managed with duloxetine . Adding gabapentin  for anxiety and hot flashes was discussed, with advice for nighttime administration due to sedative effects.

## 2023-09-06 NOTE — Assessment & Plan Note (Signed)
 Hot Flashes Experiences nocturnal hot flashes, with estrogen contraindicated due to thromboembolic history. Gabapentin  is prescribed as an alternative treatment, which also aids anxiety, with advice for nighttime administration.

## 2023-09-06 NOTE — Telephone Encounter (Signed)
 Sent mychart message

## 2023-09-06 NOTE — Assessment & Plan Note (Signed)
 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 Aggressive treatment is crucial to prevent sudden death. Treating the pulmonary condition may alleviate cardiac issues. A re-referral to a pulmonologist is planned, with consideration of anticoagulation if thromboembolism is confirmed.

## 2023-09-06 NOTE — Progress Notes (Signed)
 ==============================  [facility]: [phone]   -- Medical Office Visit --  Patient: Lydia Thompson      Age: 48 y.o.       Sex:  female  Date:   09/06/2023 Today's Healthcare Provider: Anthon Kins, MD  ==============================   CHIEF COMPLAINT: Medical Management of Chronic Issues    SUBJECTIVE: 48 y.o. female who has Essential hypertension; Hypertrophic cardiomyopathy (HCC); H/O TIA (transient ischemic attack) and stroke; Chronic heart failure with preserved ejection fraction (HCC); Obesity, Class III, BMI 40-49.9 (morbid obesity) (HCC); Post-menopause atrophic vaginitis; Dizziness; Pulmonary artery hypertension (HCC); Hot flashes, menopausal; Low libido; Nausea; Diastolic dysfunction; Anxiety disorder; Acute exacerbation of congestive heart failure (HCC); Acute respiratory failure with hypoxia (HCC); Acute on chronic diastolic CHF (congestive heart failure) (HCC); Supraventricular tachycardia (HCC); Hypokalemia; Palpitations; Hemoptysis; Abnormal EKG; Hot flashes; and Cysts of both ovaries on their problem list.  History of Present Illness The patient, with a history of heart failure, pulmonary hypertension, and hypertrophic cardiomyopathy, presents for a follow-up visit. They report no current issues, denying fluid retention or dyspnea. They are compliant with their current regimen of metoprolol , diltiazem , and other unspecified medications.  The patient has a history of transient ischemic attacks (TIAs) and is currently on aspirin  for stroke prevention. They report no stroke-like symptoms or dizziness.  They also have a history of anxiety, which they describe as fluctuating. They have been managing their anxiety with duloxetine  (Cymbalta ).  The patient has been struggling with weight loss and is seeking medication to assist with this.  The patient also reports a history of hot flashes, possibly related to the removal of an  ovary due to ovarian cysts.  The patient is currently employed and expresses a desire to continue working despite their health conditions. . Past Medical History - Heart failure - Pulmonary hypertension - Hypertrophic cardiomyopathy - History of blood clots - History of TIA - Anxiety - Hot flashes - Ovarian dermoid cyst  Medications - Metoprolol  - Diltiazem  - Baby aspirin  - Cardizem  180 mg - Lasix  20 mg - Aldactone  25 mg - Toprol  25 mg - Cymbalta  - Duloxetine   Med reconciliation: Current Outpatient Medications on File Prior to Visit  Medication Sig   aspirin  EC 81 MG tablet Take 1 tablet (81 mg total) by mouth daily. Swallow whole.   benzonatate  (TESSALON  PERLES) 100 MG capsule Take 1 capsule (100 mg total) by mouth 3 (three) times daily as needed for cough.   diltiazem  (CARDIZEM  CD) 120 MG 24 hr capsule Take 1 capsule (120 mg total) by mouth daily.   DULoxetine  (CYMBALTA ) 60 MG capsule Take 1 capsule (60 mg total) by mouth daily. Replaces 30 mg dose   estradiol  (ESTRACE ) 0.1 MG/GM vaginal cream Limit use to minimum amount to control symptoms. (Patient taking differently: Place 1 Applicatorful vaginally as needed (dryness).)   Fezolinetant  (VEOZAH ) 45 MG TABS Take 1 tablet (45 mg total) by mouth daily at 6 (six) AM.   furosemide  (LASIX ) 20 MG tablet Take 1 tablet (20 mg total) by mouth 2 (two) times daily.   LORazepam  (ATIVAN ) 0.5 MG tablet Take 1 tablet (0.5 mg total) by mouth 2 (two) times daily as needed for anxiety.   metoprolol  succinate (TOPROL  XL) 100 MG 24 hr tablet Take 1 tablet (100 mg total) by mouth daily. Take with or immediately following a meal.   Semaglutide -Weight Management 0.25 MG/0.5ML SOAJ Inject 0.25 mg into the skin once a week for 28 days.  Semaglutide -Weight Management 0.5 MG/0.5ML SOAJ Inject 0.5 mg into the skin once a week for 28 days.   [START ON 10/04/2023] Semaglutide -Weight Management 1 MG/0.5ML SOAJ Inject 1 mg into the skin once a week for 28  days.   [START ON 11/02/2023] Semaglutide -Weight Management 1.7 MG/0.75ML SOAJ Inject 1.7 mg into the skin once a week for 28 days.   [START ON 12/01/2023] Semaglutide -Weight Management 2.4 MG/0.75ML SOAJ Inject 2.4 mg into the skin once a week for 28 days.   spironolactone  (ALDACTONE ) 25 MG tablet Take 1 tablet (25 mg total) by mouth daily.   [DISCONTINUED] diltiazem  (DILACOR XR ) 180 MG 24 hr capsule Take 1 capsule (180 mg total) by mouth daily.   [DISCONTINUED] isosorbide  mononitrate (IMDUR ) 30 MG 24 hr tablet Take 1 tablet (30 mg total) by mouth daily.   No current facility-administered medications on file prior to visit.  There are no discontinued medications.    Objective   Physical Exam     09/06/2023    8:31 AM 09/04/2023    9:37 AM 08/29/2023   10:13 AM  Vitals with BMI  Height 5\' 3"  5\' 3"  5\' 2"   Weight 231 lbs 10 oz 231 lbs 2 oz 230 lbs  BMI 41.04 40.95 42.06  Systolic 113  122  Diastolic 69  80  Pulse 73  80   Wt Readings from Last 10 Encounters:  09/06/23 231 lb 9.6 oz (105.1 kg)  09/04/23 231 lb 1.6 oz (104.8 kg)  08/29/23 230 lb (104.3 kg)  08/07/23 230 lb (104.3 kg)  08/07/23 230 lb (104.3 kg)  07/31/23 227 lb (103 kg)  07/24/23 228 lb (103.4 kg)  07/09/23 222 lb 9.6 oz (101 kg)  04/14/23 225 lb (102.1 kg)  12/06/22 226 lb (102.5 kg)   Vital signs reviewed.  Nursing notes reviewed. Weight trend reviewed. Abnormalities and Problem-Specific physical exam findings:  truncal adiposity.excellent makeup application  General Appearance:  No acute distress appreciable.   Well-groomed, healthy-appearing female.  Well proportioned with no abnormal fat distribution.  Good muscle tone. Pulmonary:  Normal work of breathing at rest, no respiratory distress apparent. SpO2: 100 %  Musculoskeletal: All extremities are intact.  Neurological:  Awake, alert, oriented, and engaged.  No obvious focal neurological deficits or cognitive impairments.  Sensorium seems unclouded.   Speech is  clear and coherent with logical content. Psychiatric:  Appropriate mood, pleasant and cooperative demeanor, thoughtful and engaged during the exam    No results found for any visits on 09/06/23. Admission on 08/07/2023, Discharged on 08/07/2023  Component Date Value   Sodium 08/07/2023 137    Potassium 08/07/2023 3.6    Chloride 08/07/2023 100    CO2 08/07/2023 31    Glucose, Bld 08/07/2023 86    BUN 08/07/2023 17    Creatinine, Ser 08/07/2023 0.83    Calcium  08/07/2023 9.7    GFR, Estimated 08/07/2023 >60    Anion gap 08/07/2023 6    WBC 08/07/2023 10.8 (H)    RBC 08/07/2023 4.64    Hemoglobin 08/07/2023 12.8    HCT 08/07/2023 38.8    MCV 08/07/2023 83.6    MCH 08/07/2023 27.6    MCHC 08/07/2023 33.0    RDW 08/07/2023 14.0    Platelets 08/07/2023 299    nRBC 08/07/2023 0.0    Troponin I (High Sensiti* 08/07/2023 10    B Natriuretic Peptide 08/07/2023 168.1 (H)    D-Dimer, Quant 08/07/2023 1.45 (H)    Troponin I (High Sensiti* 08/07/2023 9  Office Visit on 07/24/2023  Component Date Value   Sodium 07/24/2023 135    Potassium 07/24/2023 4.1    Chloride 07/24/2023 100    CO2 07/24/2023 28    Glucose, Bld 07/24/2023 99    BUN 07/24/2023 18    Creatinine, Ser 07/24/2023 0.87    GFR 07/24/2023 80.07    Calcium  07/24/2023 9.5    Pro B Natriuretic peptid* 07/24/2023 120.0 (H)    Microalb, Ur 07/24/2023 <0.7    Creatinine,U 07/24/2023 73.1    Microalb Creat Ratio 07/24/2023 1.0    SARS Coronavirus 2 Ag 07/24/2023 Negative   Admission on 07/07/2023, Discharged on 07/09/2023  Component Date Value   Sodium 07/07/2023 137    Potassium 07/07/2023 3.7    Chloride 07/07/2023 102    CO2 07/07/2023 25    Glucose, Bld 07/07/2023 125 (H)    BUN 07/07/2023 20    Creatinine, Ser 07/07/2023 0.90    Calcium  07/07/2023 8.9    GFR, Estimated 07/07/2023 >60    Anion gap 07/07/2023 10    WBC 07/07/2023 17.6 (H)    RBC 07/07/2023 4.89    Hemoglobin 07/07/2023 13.4    HCT  07/07/2023 41.0    MCV 07/07/2023 83.8    MCH 07/07/2023 27.4    MCHC 07/07/2023 32.7    RDW 07/07/2023 13.9    Platelets 07/07/2023 288    nRBC 07/07/2023 0.0    Troponin I (High Sensiti* 07/07/2023 45 (H)    B Natriuretic Peptide 07/07/2023 468.0 (H)    Troponin I (High Sensiti* 07/07/2023 41 (H)    HIV Screen 4th Generatio* 07/07/2023 Non Reactive    Weight 07/08/2023 3,566.16    Height 07/08/2023 62    BP 07/08/2023 109/74    S' Lateral 07/08/2023 2.40    Area-P 1/2 07/08/2023 3.13    Radius 07/08/2023 0.60    MV M vel 07/08/2023 4.99    MV Peak grad 07/08/2023 99.6    Est EF 07/08/2023 70 - 75%    WBC 07/08/2023 13.7 (H)    RBC 07/08/2023 4.76    Hemoglobin 07/08/2023 12.9    HCT 07/08/2023 39.1    MCV 07/08/2023 82.1    MCH 07/08/2023 27.1    MCHC 07/08/2023 33.0    RDW 07/08/2023 13.4    Platelets 07/08/2023 283    nRBC 07/08/2023 0.0    Neutrophils Relative % 07/08/2023 64    Neutro Abs 07/08/2023 8.6 (H)    Lymphocytes Relative 07/08/2023 25    Lymphs Abs 07/08/2023 3.4    Monocytes Relative 07/08/2023 8    Monocytes Absolute 07/08/2023 1.1 (H)    Eosinophils Relative 07/08/2023 3    Eosinophils Absolute 07/08/2023 0.4    Basophils Relative 07/08/2023 0    Basophils Absolute 07/08/2023 0.0    Immature Granulocytes 07/08/2023 0    Abs Immature Granulocytes 07/08/2023 0.06    Magnesium 07/08/2023 2.2    Phosphorus 07/08/2023 4.2    Sodium 07/08/2023 135    Potassium 07/08/2023 3.3 (L)    Chloride 07/08/2023 97 (L)    CO2 07/08/2023 28    Glucose, Bld 07/08/2023 109 (H)    BUN 07/08/2023 20    Creatinine, Ser 07/08/2023 0.84    Calcium  07/08/2023 9.3    Total Protein 07/08/2023 7.7    Albumin 07/08/2023 3.6    AST 07/08/2023 14 (L)    ALT 07/08/2023 16    Alkaline Phosphatase 07/08/2023 65    Total Bilirubin 07/08/2023 1.2 (H)  GFR, Estimated 07/08/2023 >60    Anion gap 07/08/2023 10    Sodium 07/09/2023 135    Potassium 07/09/2023 4.2    Chloride  07/09/2023 101    CO2 07/09/2023 27    Glucose, Bld 07/09/2023 111 (H)    BUN 07/09/2023 17    Creatinine, Ser 07/09/2023 0.89    Calcium  07/09/2023 9.2    GFR, Estimated 07/09/2023 >60    Anion gap 07/09/2023 7    Magnesium 07/09/2023 2.7 (H)   Scanned Document on 05/04/2023  Component Date Value   HM Mammogram 05/03/2023 0-4 Bi-Rad   Office Visit on 12/06/2022  Component Date Value   Cholesterol 12/06/2022 182    Triglycerides 12/06/2022 95.0    HDL 12/06/2022 34.80 (L)    VLDL 12/06/2022 19.0    LDL Cholesterol 12/06/2022 128 (H)    Total CHOL/HDL Ratio 12/06/2022 5    NonHDL 12/06/2022 146.81    TSH 12/06/2022 2.38    Sodium 12/06/2022 135    Potassium 12/06/2022 4.0    Chloride 12/06/2022 98    CO2 12/06/2022 29    Glucose, Bld 12/06/2022 101 (H)    BUN 12/06/2022 17    Creatinine, Ser 12/06/2022 0.87    Total Bilirubin 12/06/2022 0.7    Alkaline Phosphatase 12/06/2022 70    AST 12/06/2022 16    ALT 12/06/2022 22    Total Protein 12/06/2022 7.8    Albumin 12/06/2022 4.4    GFR 12/06/2022 80.42    Calcium  12/06/2022 9.4    WBC 12/06/2022 10.5    RBC 12/06/2022 5.08    Hemoglobin 12/06/2022 14.1    HCT 12/06/2022 41.9    MCV 12/06/2022 82.5    MCHC 12/06/2022 33.6    RDW 12/06/2022 14.6    Platelets 12/06/2022 315.0    Neutrophils Relative % 12/06/2022 66.0    Lymphocytes Relative 12/06/2022 24.0    Monocytes Relative 12/06/2022 6.6    Eosinophils Relative 12/06/2022 2.8    Basophils Relative 12/06/2022 0.6    Neutro Abs 12/06/2022 6.9    Lymphs Abs 12/06/2022 2.5    Monocytes Absolute 12/06/2022 0.7    Eosinophils Absolute 12/06/2022 0.3    Basophils Absolute 12/06/2022 0.1    Hgb A1c MFr Bld 12/06/2022 5.5    Magnesium 12/06/2022 2.2   No image results found. LONG TERM MONITOR (3-14 DAYS) Result Date: 09/01/2023 Zio patch monitor 13 days 08/03/2023 - 08/17/2023: Dominant rhythm: Sinus. HR 50-121 bpm. Avg HR 74 bpm, in sinus rhythm. 3 episodes of atrial  tachycardia, fastest at 154 bpm for 5 beats, longest for 5 beats at 140 bpm. <1% isolated SVE, couplet/triplets. 1 episode of VT, fastest at 182 bpm for 5 bears. <1% isolated VE, couplets. No atrial fibrillation/atrial flutter/VT/high grade AV block, sinus pause >3sec noted. 14 patient triggered events, most correlated with SVE, VE.   CT Angio Chest PE W/Cm &/Or Wo Cm Result Date: 08/07/2023 CLINICAL DATA:  Chest pain and shortness of breath with hemoptysis. EXAM: CT ANGIOGRAPHY CHEST WITH CONTRAST TECHNIQUE: Multidetector CT imaging of the chest was performed using the standard protocol during bolus administration of intravenous contrast. Multiplanar CT image reconstructions and MIPs were obtained to evaluate the vascular anatomy. RADIATION DOSE REDUCTION: This exam was performed according to the departmental dose-optimization program which includes automated exposure control, adjustment of the mA and/or kV according to patient size and/or use of iterative reconstruction technique. CONTRAST:  80mL OMNIPAQUE  IOHEXOL  350 MG/ML SOLN COMPARISON:  October 17, 2018 FINDINGS: Cardiovascular: The thoracic  aorta is normal in appearance. Satisfactory opacification of the pulmonary arteries to the segmental level. No evidence of pulmonary embolism. Normal heart size. No pericardial effusion. Mediastinum/Nodes: No enlarged mediastinal, hilar, or axillary lymph nodes. Thyroid gland, trachea, and esophagus demonstrate no significant findings. Lungs/Pleura: Areas of patchy in a mild to moderate severity ground-glass appearance of the lung parenchyma is seen throughout the bilateral lower lobes. There is no evidence of acute infiltrate, pleural effusion or pneumothorax. Upper Abdomen: No acute abnormality. Musculoskeletal: No chest wall abnormality. No acute or significant osseous findings. Review of the MIP images confirms the above findings. IMPRESSION: 1. No evidence of pulmonary embolism. 2. Areas of ground-glass  appearance of the lung parenchyma which is a nonspecific finding and may be seen in the setting of pulmonary edema or atypical infection. Electronically Signed   By: Virgle Grime M.D.   On: 08/07/2023 14:51   DG Chest Port 1 View Result Date: 08/07/2023 CLINICAL DATA:  Cough for 3 days EXAM: PORTABLE CHEST 1 VIEW COMPARISON:  X-ray 07/07/2023. FINDINGS: The heart size and mediastinal contours are within normal limits. Both lungs are clear. Underinflation. No consolidation, pneumothorax or effusion. No edema. Eventration of the right hemidiaphragm. The visualized skeletal structures are unremarkable. Overlapping cardiac leads. Artifact overlying the left upper thorax as well. IMPRESSION: Underinflation.  No acute cardiopulmonary disease. Electronically Signed   By: Adrianna Horde M.D.   On: 08/07/2023 13:47   MR CARDIAC MORPHOLOGY W WO CONTRAST Result Date: 07/26/2023 CLINICAL DATA:  HCM EXAM: CARDIAC MRI TECHNIQUE: The patient was scanned on a 1.5 Tesla Siemens magnet. A dedicated cardiac coil was used. Functional imaging was done using Fiesta sequences. 2,3, and 4 chamber views were done to assess for RWMA's. Modified Simpson's rule using a short axis stack was used to calculate an ejection fraction on a dedicated work [employer]. The patient received 13 cc of Gadavist . After 10 minutes inversion recovery sequences were used to assess for infiltration and scar tissue. Velocity flow mapping performed in the ascending aorta and main pulmonary artery. CONTRAST:  13 cc  of Gadavist  FINDINGS: 1. Normal left ventricular size, Severe asymmetric LV basal-septal wall thickness, measuring upto 1.8cm. Normal systolic function (LVEF = 57%). There are no regional wall motion abnormalities. 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 There is no late gadolinium enhancement in the left ventricular myocardium. LVEDV: 159 ml LVESV: 69 ml SV: 90 ml CO: 5L/min  Myocardial mass: 145g 2. Normal right ventricular size, thickness and systolic function (RVEF = 54%). There are no regional wall motion abnormalities. 3.  Moderately dilated left atrial size, normal right atrial size. 4. Normal size of the aortic root, ascending aorta and pulmonary artery. 5. Mild mitral regurgitation. no significant valvular abnormalities. 6.  Normal pericardium.  No pericardial effusion. IMPRESSION: 1. Normal LV size and systolic function, LVEF = 57%. 2. Severe asymmetric LV basal-septal wall thickness, measuring upto 1.8 cm. 3. 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 4. There is no late gadolinium enhancement in the left ventricular myocardium. 5. Normal RV function. 6. 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 Electronically Signed   By: Constancia Delton M.D.   On: 07/26/2023 18:17   MR CARDIAC VELOCITY FLOW MAP Result Date: 07/26/2023 CLINICAL DATA:  HCM EXAM: CARDIAC MRI TECHNIQUE: The patient was scanned on a 1.5 Tesla Siemens magnet. A dedicated cardiac coil was used. Functional imaging was done using Fiesta sequences. 2,3, and  4 chamber views were done to assess for RWMA's. Modified Simpson's rule using a short axis stack was used to calculate an ejection fraction on a dedicated work [employer] officer, [employer]. The patient received 13 cc of Gadavist . After 10 minutes inversion recovery sequences were used to assess for infiltration and scar tissue. Velocity flow mapping performed in the ascending aorta and main pulmonary artery. CONTRAST:  13 cc  of Gadavist  FINDINGS: 1. Normal left ventricular size, Severe asymmetric LV basal-septal wall thickness, measuring upto 1.8cm. Normal systolic function (LVEF = 57%). There are no regional wall motion abnormalities. 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 There is no late gadolinium enhancement in the left ventricular myocardium.  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 Normal right ventricular size, thickness and systolic function (RVEF = 54%). There are no regional wall motion abnormalities. 3.  Moderately dilated left atrial size, normal right atrial size. 4. Normal size of the aortic root, ascending aorta and pulmonary artery. 5. Mild mitral regurgitation. no significant valvular abnormalities. 6.  Normal pericardium.  No pericardial effusion. IMPRESSION: 1. Normal LV size and systolic function, LVEF = 57%. 2. Severe asymmetric LV basal-septal wall thickness, measuring upto 1.8 cm. 3. 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 4. There is no late gadolinium enhancement in the left ventricular myocardium. 5. Normal RV function. 6. 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 Electronically Signed   By: Constancia Delton M.D.   On: 07/26/2023 18:17   MR CARDIAC VELOCITY FLOW MAP Result Date: 07/26/2023 CLINICAL DATA:  HCM EXAM: CARDIAC MRI TECHNIQUE: The patient was scanned on a 1.5 Tesla Siemens magnet. A dedicated cardiac coil was used. Functional imaging was done using Fiesta sequences. 2,3, and 4 chamber views were done to assess for RWMA's. Modified Simpson's rule using a short axis stack was used to calculate an ejection fraction on a dedicated work [employer] officer, [employer]. The patient received 13 cc of Gadavist . After 10 minutes inversion recovery sequences were used to assess for infiltration and scar tissue. Velocity flow mapping performed in the ascending aorta and main pulmonary artery. CONTRAST:  13 cc  of Gadavist  FINDINGS: 1. Normal left ventricular size, Severe asymmetric LV basal-septal wall thickness, measuring upto 1.8cm. Normal systolic function (LVEF = 57%). There are no regional wall motion abnormalities. 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 There is no late gadolinium  enhancement in the left ventricular myocardium. 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 Normal right ventricular size, thickness and systolic function (RVEF = 54%). There are no regional wall motion abnormalities. 3.  Moderately dilated left atrial size, normal right atrial size. 4. Normal size of the aortic root, ascending aorta and pulmonary artery. 5. Mild mitral regurgitation. no significant valvular abnormalities. 6.  Normal pericardium.  No pericardial effusion. IMPRESSION: 1. Normal LV size and systolic function, LVEF = 57%. 2. Severe asymmetric LV basal-septal wall thickness, measuring upto 1.8 cm. 3. 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 4. There is no late gadolinium enhancement in the left ventricular myocardium. 5. Normal RV function. 6. 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 Electronically Signed   By: Constancia Delton M.D.   On: 07/26/2023 18:17   ECHOCARDIOGRAM COMPLETE Result Date: 07/08/2023    ECHOCARDIOGRAM REPORT   Patient Name:   CHARELLE  Peder Bourdon Date of Exam: 07/08/2023 Medical Rec #:  [id number]     Height:       62.0 in Accession #:    [id number]    Weight:       222.9 lb Date of Birth:  March 03, 1976    BSA:          2.002 m Patient Age:    47 years      BP:           109/74 mmHg Patient Gender: F             HR:           66 bpm. Exam Location:  Inpatient Procedure: 2D Echo, Color Doppler, Cardiac Doppler and Intracardiac            Opacification Agent Indications:    R06.9 DOE  History:        Patient has prior history of Echocardiogram examinations, most                 recent 05/29/2022. CHF, Pulmonary HTN; Risk Factors:Hypertension.  Sonographer:    Sherline Distel Senior RDCS Referring Phys: [phone] PROSPER M AMPONSAH IMPRESSIONS  1. 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 Left  ventricular ejection fraction, by estimation, is 70 to 75%. The left ventricle has hyperdynamic  function. The left ventricle has no regional wall motion abnormalities. There is severe asymmetric left ventricular hypertrophy of the septal segment (basal septum measures 20 mm). Hypertrophied papillary muscles. Left ventricular diastolic parameters are indeterminate.  2. Right ventricular systolic function is normal. The right ventricular size is normal. Tricuspid regurgitation signal is inadequate for assessing PA pressure.  3. Left atrial size was severely dilated.  4. Systolic anterior motion of the mitral valve in the setting of LVOT obstruction. The mitral valve is grossly normal. Moderate to severe mitral valve regurgitation. No evidence of mitral stenosis.  5. The aortic valve is grossly normal. Aortic valve regurgitation is not visualized. No aortic stenosis is present.  6. The inferior vena cava is normal in size with greater than 50% respiratory variability, suggesting right atrial pressure of 3 mmHg. 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 Left ventricular ejection fraction, by estimation, is 70 to 75%. The left ventricle has  hyperdynamic function. The left ventricle has no regional wall motion abnormalities. Definity  contrast agent was given IV to delineate the left ventricular endocardial borders. The left ventricular internal cavity size was normal in size. There is severe asymmetric left ventricular hypertrophy of the septal segment. Left ventricular diastolic parameters are indeterminate. Right Ventricle: The right ventricular size is normal. Right vetricular wall thickness was not well visualized. Right ventricular systolic function is normal. Tricuspid regurgitation signal is inadequate for assessing PA pressure. Left Atrium: Left atrial size was severely dilated.  Right Atrium: Right atrial size was normal in size. Pericardium: There is no evidence of pericardial effusion. Mitral Valve: Systolic anterior motion of the mitral valve in the setting of LVOT obstruction. The mitral valve is grossly normal. Moderate to severe mitral valve regurgitation, with eccentric posteriorly directed jet. No evidence of mitral valve stenosis. Tricuspid Valve: The tricuspid valve is normal in structure. Tricuspid valve regurgitation is trivial. No evidence of tricuspid stenosis. Aortic Valve: The aortic valve is grossly normal. Aortic  valve regurgitation is not visualized. No aortic stenosis is present. Pulmonic Valve: The pulmonic valve was normal in structure. Pulmonic valve regurgitation is trivial. No evidence of pulmonic stenosis. Aorta: The aortic root and ascending aorta are structurally normal, with no evidence of dilitation. Venous: The inferior vena cava is normal in size with greater than 50% respiratory variability, suggesting right atrial pressure of 3 mmHg. IAS/Shunts: The interatrial septum was not well visualized.  LEFT VENTRICLE PLAX 2D LVIDd:         3.80 cm   Diastology LVIDs:         2.40 cm   LV e' medial:    7.40 cm/s LV PW:         1.20 cm   LV E/e' medial:  13.0 LV IVS:        2.00 cm   LV e' lateral:   10.90 cm/s LVOT diam:     2.00 cm   LV E/e' lateral: 8.8 LVOT Area:     3.14 cm  RIGHT VENTRICLE RV S prime:     13.90 cm/s TAPSE (M-mode): 1.8 cm LEFT ATRIUM              Index        RIGHT ATRIUM           Index LA diam:        5.00 cm  2.50 cm/m   RA Area:     15.80 cm LA Vol (A2C):   87.7 ml  43.81 ml/m  RA Volume:   41.00 ml  20.48 ml/m LA Vol (A4C):   130.0 ml 64.94 ml/m LA Biplane Vol: 112.0 ml 55.95 ml/m   AORTA Ao Root diam: 2.80 cm Ao Asc diam:  3.20 cm MITRAL VALVE MV Area (PHT): 3.13 cm       SHUNTS MV Decel Time: 242 msec       Systemic Diam: 2.00 cm MR Peak grad:    99.6 mmHg MR Mean grad:    77.0 mmHg MR Vmax:         499.00 cm/s MR Vmean:         427.0 cm/s MR PISA:         2.26 cm MR PISA Eff ROA: 17 mm MR PISA Radius:  0.60 cm MV E velocity: 95.90 cm/s MV A velocity: 65.50 cm/s MV E/A ratio:  1.46 Grady Lawman MD Electronically signed by Grady Lawman MD Signature Date/Time: 07/08/2023/4:16:34 PM    Final    DG Chest 2 View Result Date: 07/07/2023 CLINICAL DATA:  Chest pain that started on Sunday.  History of CHF EXAM: CHEST - 2 VIEW COMPARISON:  02/19/2020 FINDINGS: Diffuse interstitial opacity with Kerley lines, fissure thickening, airway cuffing. Symmetric hazy airspace disease. Borderline heart size. Upper mediastinal contours are stable. IMPRESSION: Pulmonary edema. Electronically Signed   By: Ronnette Coke M.D.   On: 07/07/2023 07:06  CT Angio Chest PE W/Cm &/Or Wo Cm Result Date: 08/07/2023 CLINICAL DATA:  Chest pain and shortness of breath with hemoptysis. EXAM: CT ANGIOGRAPHY CHEST WITH CONTRAST TECHNIQUE: Multidetector CT imaging of the chest was performed using the standard protocol during bolus administration of intravenous contrast. Multiplanar CT image reconstructions and MIPs were obtained to evaluate the vascular anatomy. RADIATION DOSE REDUCTION: This exam was performed according to the departmental dose-optimization program which includes automated exposure control, adjustment of the mA and/or kV according to patient size and/or use of iterative reconstruction technique. CONTRAST:  80mL OMNIPAQUE  IOHEXOL  350 MG/ML SOLN  COMPARISON:  October 17, 2018 FINDINGS: Cardiovascular: The thoracic aorta is normal in appearance. Satisfactory opacification of the pulmonary arteries to the segmental level. No evidence of pulmonary embolism. Normal heart size. No pericardial effusion. Mediastinum/Nodes: No enlarged mediastinal, hilar, or axillary lymph nodes. Thyroid gland, trachea, and esophagus demonstrate no significant findings. Lungs/Pleura: Areas of patchy in a mild to moderate severity ground-glass appearance of the lung  parenchyma is seen throughout the bilateral lower lobes. There is no evidence of acute infiltrate, pleural effusion or pneumothorax. Upper Abdomen: No acute abnormality. Musculoskeletal: No chest wall abnormality. No acute or significant osseous findings. Review of the MIP images confirms the above findings. IMPRESSION: 1. No evidence of pulmonary embolism. 2. Areas of ground-glass appearance of the lung parenchyma which is a nonspecific finding and may be seen in the setting of pulmonary edema or atypical infection. Electronically Signed   By: Virgle Grime M.D.   On: 08/07/2023 14:51   DG Chest Port 1 View Result Date: 08/07/2023 CLINICAL DATA:  Cough for 3 days EXAM: PORTABLE CHEST 1 VIEW COMPARISON:  X-ray 07/07/2023. FINDINGS: The heart size and mediastinal contours are within normal limits. Both lungs are clear. Underinflation. No consolidation, pneumothorax or effusion. No edema. Eventration of the right hemidiaphragm. The visualized skeletal structures are unremarkable. Overlapping cardiac leads. Artifact overlying the left upper thorax as well. IMPRESSION: Underinflation.  No acute cardiopulmonary disease. Electronically Signed   By: Adrianna Horde M.D.   On: 08/07/2023 13:47       Assessment & Plan Pulmonary artery hypertension (HCC) Pulmonary Arterial Hypertension This chronic condition likely stems from pulmonary hypertension, causing cardiac strain with symptoms of heart failure and potential thromboembolism. Aggressive treatment is crucial to prevent sudden death. Treating the pulmonary condition may alleviate cardiac issues. A re-referral to a pulmonologist is planned, with consideration of anticoagulation if thromboembolism is confirmed. Obesity, Class III, BMI 40-49.9 (morbid obesity) (HCC) Obesity Weight loss medications were discussed, facing insurance barriers. Options include compounded tirzepatide , Wegovy , and potentially covered Mounjaro . Weight loss could significantly improve  overall health and reduce cardiac strain. Recommend compounded tirzepatide  or Wegovy , order Mounjaro  if covered by insurance, and encourage weight loss for overall health improvement. Hot flashes  Essential hypertension  Hot flashes, menopausal Hot Flashes Experiences nocturnal hot flashes, with estrogen contraindicated due to thromboembolic history. Gabapentin  is prescribed as an alternative treatment, which also aids anxiety, with advice for nighttime administration. Supraventricular tachycardia (HCC) Well-controlled with metoprolol  diltiazem , wore heart monitor. Diastolic dysfunction  Chronic heart failure with preserved ejection fraction (HCC) Heart Failure Heart failure is secondary to pulmonary arterial hypertension and is well-managed with no fluid retention or dyspnea. Current medications include metoprolol  and diltiazem . It is important to continue these medications and monitor for heart failure symptoms. A follow-up with the cardiologist is scheduled for February 3rd to discuss further treatment options, including medication or surgery. H/O TIA (transient ischemic attack) and stroke Transient Ischemic Attack (TIA) Managed with aspirin , which should be continued despite bruising due to its role in reducing thromboembolic risk, acknowledging the increased bleeding risk. Monitoring for stroke-like symptoms is advised. Cysts of both ovaries  Hypertrophic cardiomyopathy (HCC) Hypertrophic Cardiomyopathy This condition may have a genetic etiology contributing to heart failure. A cardiology consultation is scheduled to discuss treatment options, including medication or surgery, with emphasis on the cardiologist's input. Anxiety disorder, unspecified type Anxiety Anxiety fluctuates and is currently managed with duloxetine . Adding gabapentin  for anxiety and hot flashes was discussed, with advice for nighttime administration due to  sedative effects.     Orders Placed During this Encounter:    Orders Placed This Encounter  Procedures   Ambulatory referral to Pulmonology    Referral Priority:   Routine    Referral Type:   Consultation    Referral Reason:   Specialty Services Required    Requested Specialty:   Pulmonary Disease    Number of Visits Requested:   1   Meds ordered this encounter  Medications   Semaglutide ,0.25 or 0.5MG /DOS, (OZEMPIC , 0.25 OR 0.5 MG/DOSE,) 2 MG/3ML SOPN    Sig: Inject 0.25 mg into the skin once a week.    Dispense:  3 mL    Refill:  11   gabapentin  (NEURONTIN ) 300 MG capsule    Sig: Take 1 capsule (300 mg total) by mouth 3 (three) times daily.    Dispense:  90 capsule    Refill:  3   Semaglutide  (RYBELSUS ) 3 MG TABS    Sig: Take 1 tablet (3 mg total) by mouth daily.    Dispense:  30 tablet    Refill:  2    General Health Maintenance Maintaining insurance is encouraged, and staying on top of health conditions is advised. Discussed potential for disability and Medicare eligibility if insurance issues persist.  Follow-up A follow-up is scheduled in three months. Contact is advised if medications are not received or if the pulmonology appointment is not scheduled.    This document was synthesized by artificial intelligence (Abridge) using HIPAA-compliant recording of the clinical interaction;   We discussed the use of AI scribe software for clinical note transcription with the patient, who gave verbal consent to proceed.    Additional Info: This encounter employed state-of-the-art, real-time, collaborative documentation. The patient actively reviewed and assisted in updating their electronic medical record on a shared screen, ensuring transparency and facilitating joint problem-solving for the problem list, overview, and plan. This approach promotes accurate, informed care. The treatment plan was discussed and reviewed in detail, including medication safety, potential side effects, and all patient questions. We confirmed understanding and  comfort with the plan. Follow-up instructions were established, including contacting the office for any concerns, returning if symptoms worsen, persist, or new symptoms develop, and precautions for potential emergency department visits.

## 2023-09-06 NOTE — Assessment & Plan Note (Signed)
 Heart Failure Heart failure is secondary to pulmonary arterial hypertension and is well-managed with no fluid retention or dyspnea. Current medications include metoprolol  and diltiazem . It is important to continue these medications and monitor for heart failure symptoms. A follow-up with the cardiologist is scheduled for February 3rd to discuss further treatment options, including medication or surgery.

## 2023-09-06 NOTE — Patient Instructions (Addendum)
 Compounded tirzepatide  and compounded Wegovy  online, sending script for Rybelsus  but if they won't approve. Call lung specialist you took picture for. We resubmitted referral.  VISIT SUMMARY:  During today's follow-up visit, we reviewed your ongoing health conditions, including heart failure, pulmonary hypertension, hypertrophic cardiomyopathy, history of transient ischemic attacks (TIAs), anxiety, weight management, and hot flashes. You reported no current issues and are compliant with your medications. We discussed various treatment plans and follow-up appointments to manage your conditions effectively.  YOUR PLAN:  -PULMONARY ARTERIAL HYPERTENSION: Pulmonary arterial hypertension is a chronic condition that causes high blood pressure in the arteries of the lungs, leading to heart strain. We will re-refer you to a pulmonologist and hope you will call to schedule.  -HEART FAILURE: Heart failure occurs when the heart cannot pump blood effectively. Your condition is stable with no fluid retention or shortness of breath. Continue taking metoprolol  and diltiazem , and monitor for symptoms. A follow-up with your cardiologist is scheduled for February 3rd to discuss further treatment options.  -HYPERTROPHIC CARDIOMYOPATHY: Hypertrophic cardiomyopathy is a condition where the heart muscle becomes abnormally thick, potentially leading to heart failure. A cardiology consultation is scheduled to discuss treatment options, including medication or surgery.  -TRANSIENT ISCHEMIC ATTACK (TIA): A TIA is a temporary period of symptoms similar to those of a stroke. Continue taking aspirin  to reduce the risk of stroke, despite the bruising. Monitor for any stroke-like symptoms.  -OBESITY: Obesity can contribute to various health issues, including heart strain. We discussed weight loss medications such as compounded tirzepatide , Wegovy , and Mounjaro . We recommend trying compounded tirzepatide  or Wegovy , and will order  Mounjaro  if covered by your insurance. Weight loss is encouraged to improve overall health.  -ANXIETY: Anxiety is a mental health condition that can cause feelings of worry or fear. Your anxiety is currently managed with duloxetine . We discussed adding gabapentin , which can also help with hot flashes. Take gabapentin  at night due to its sedative effects.  -HOT FLASHES: Hot flashes are sudden feelings of warmth, often related to hormonal changes. Since estrogen is not an option due to your history of thromboembolism, we prescribed gabapentin , which should be taken at night to also help with anxiety.  -GENERAL HEALTH MAINTENANCE: It is important to maintain your insurance and stay on top of your health conditions. We discussed the potential for disability and Medicare eligibility if insurance issues persist.  INSTRUCTIONS:  Please follow up in three months. Contact us  if you do not receive your medications or if the pulmonology appointment is not scheduled. Your next cardiologist appointment is on February 3rd.

## 2023-09-06 NOTE — Assessment & Plan Note (Signed)
 Hypertrophic Cardiomyopathy This condition may have a genetic etiology contributing to heart failure. A cardiology consultation is scheduled to discuss treatment options, including medication or surgery, with emphasis on the cardiologist's input.

## 2023-09-06 NOTE — Assessment & Plan Note (Signed)
 Well-controlled with metoprolol  diltiazem , wore heart monitor.

## 2023-09-06 NOTE — Assessment & Plan Note (Signed)
 Transient Ischemic Attack (TIA) Managed with aspirin , which should be continued despite bruising due to its role in reducing thromboembolic risk, acknowledging the increased bleeding risk. Monitoring for stroke-like symptoms is advised.

## 2023-09-07 NOTE — Telephone Encounter (Signed)
[employer]PS[employer] Tasia Catchings: [id number] (exp. 09/01/23 to 11/30/23)   Patient is scheduled at [facility] for 10/02/23 at 8 pm.  Mailed packet as well.

## 2023-09-08 ENCOUNTER — Telehealth: Payer: Self-pay

## 2023-09-08 NOTE — Telephone Encounter (Signed)
Pharmacy Patient Advocate Encounter   Received notification from  [facility] Portal that prior authorization for Rybelsus 3MG  tablets is required/requested.   Insurance verification completed.   The patient is insured through [facility] .   Per test claim: PA required; PA submitted to above mentioned insurance via CoverMyMeds Key/confirmation #/EOC [id number] Status is pending

## 2023-09-11 ENCOUNTER — Telehealth: Payer: Self-pay | Admitting: *Deleted

## 2023-09-11 ENCOUNTER — Other Ambulatory Visit (HOSPITAL_COMMUNITY): Payer: Self-pay

## 2023-09-11 ENCOUNTER — Other Ambulatory Visit: Payer: Self-pay

## 2023-09-11 ENCOUNTER — Telehealth: Payer: Self-pay | Admitting: Pharmacist

## 2023-09-11 NOTE — Progress Notes (Signed)
Complex Care Management Care Guide Note  09/11/2023 Name: Lydia Thompson MRN: [id number] DOB: 07-01-1976  Lydia Thompson is a 48 y.o. year old female who is a primary care patient of Lula Olszewski, MD and is actively engaged with the care management team. I reached out to Fransisca Kaufmann by phone today to assist with re-scheduling  with the RN Case Manager.  Follow up plan: Unsuccessful telephone outreach attempt made. A HIPAA compliant phone message was left for the patient providing contact information and requesting a return call. No further outreach attempts will be made at this time. We have been unable to contact the patient to offer or enroll patient in complex care management services.  Gwenevere Ghazi  [facility]  [facility], [facility] Guide  Direct Dial: [phone]  Fax [phone]

## 2023-09-11 NOTE — Telephone Encounter (Signed)
Additional information was requested from [employer].  Form was completed and faxed to [phone] with supporting documentation.

## 2023-09-11 NOTE — Progress Notes (Signed)
Complex Care Management Care Guide Note  09/11/2023 Name: Lydia Thompson MRN: [id number] DOB: [birth year]  Lydia Thompson is a 48 y.o. year old female who is a primary care patient of Lula Olszewski, MD and is actively engaged with the care management team. I reached out to Fransisca Kaufmann by phone today to assist with re-scheduling  with the RN Case Manager.  Follow up plan: Telephone appointment with complex care management team member scheduled for:  09/26/23 Gwenevere Ghazi  [facility]  [facility], [place] [facility] Guide  Direct Dial: [phone]  Fax [phone]

## 2023-09-12 ENCOUNTER — Ambulatory Visit: Payer: Self-pay | Admitting: Licensed Clinical Social Worker

## 2023-09-12 NOTE — Patient Outreach (Signed)
  Care Coordination   Follow Up Visit Note   09/12/2023 Name: Lydia Thompson MRN: [id number] DOB: 08-May-1976  Lydia Thompson is a 48 y.o. year old female who sees Lula Olszewski, MD for primary care. I spoke with  Fransisca Kaufmann by phone today.  What matters to the patients health and wellness today?  Food Insecurities    Goals Addressed             This Visit's Progress    Care Coordination Activities       Care Coordination Interventions: Patient stated that she has lots of stress due to her husband being sick and herself, she stated that it was a lot and that she has to miss work to take him to appointments but her job understands, The SW educated and offered to assist with a SCAT application, but the patient stated that would rather take her husband to his appointments because he does not always take in the information correctly from his appointments. Patient stated that she works and has never applied for [employer] benefits but would like to see if she can be approved, SW will also send the link and the food pantry resources via email to the patient. Patient also wants to be referred to a LCSW for stress and depression to life issue and to a RN to talk about health related issues. SW will follow up on 09/29/2023 at 2:00 pm         SDOH assessments and interventions completed:  Yes     Care Coordination Interventions:  Yes, provided  Interventions Today    Flowsheet Row Most Recent Value  General Interventions   General Interventions Discussed/Reviewed General Interventions Reviewed, [employer] did not receive the email with mailed resources, SW will send by regualr mail]        Follow up plan: Follow up call scheduled for 09/29/2023 at 2:00 pm    Encounter Outcome:  Patient Visit Completed   Jeanie Cooks, PhD [facility], [facility] Social Worker Direct Dial: [phone]  Fax: [phone]

## 2023-09-12 NOTE — Patient Instructions (Signed)
Visit Information  Thank you for taking time to visit with me today. Please don't hesitate to contact me if I can be of assistance to you.   Following are the goals we discussed today:   Goals Addressed             This Visit's Progress    Care Coordination Activities       Care Coordination Interventions: Patient stated that she has lots of stress due to her husband being sick and herself, she stated that it was a lot and that she has to miss work to take him to appointments but her job understands, The SW educated and offered to assist with a SCAT application, but the patient stated that would rather take her husband to his appointments because he does not always take in the information correctly from his appointments. Patient stated that she works and has never applied for [employer] benefits but would like to see if she can be approved, SW will also send the link and the food pantry resources via email to the patient. Patient also wants to be referred to a LCSW for stress and depression to life issue and to a RN to talk about health related issues. SW will follow up on 09/29/2023 at 2:00 pm         Our next appointment is by telephone on 09/29/2023 at 2:00 pm  Please call the care guide team at [phone] if you need to cancel or reschedule your appointment.   If you are experiencing a Mental Health or Behavioral Health Crisis or need someone to talk to, please call the Suicide and Crisis Lifeline: 988 go to [facility] [address] [place] [phone]) call 911  Patient verbalizes understanding of instructions and care plan provided today and agrees to view in MyChart. Active MyChart status and patient understanding of how to access instructions and care plan via MyChart confirmed with patient.     Jeanie Cooks, PhD [facility], [facility] Social Worker Direct Dial: [phone]  Fax:  [phone]

## 2023-09-13 ENCOUNTER — Other Ambulatory Visit: Payer: Self-pay

## 2023-09-14 ENCOUNTER — Other Ambulatory Visit (HOSPITAL_COMMUNITY): Payer: 59

## 2023-09-15 ENCOUNTER — Other Ambulatory Visit: Payer: Self-pay

## 2023-09-15 NOTE — Telephone Encounter (Signed)
Pharmacy Patient Advocate Encounter  Received notification from [facility] that Prior Authorization for Rybelsus 3MG  tablets has been DENIED.  See denial reason below. No denial letter attached in CMM. Will attach denial letter to Media tab once received.   PA #/Case ID/Reference #: Lydia Thompson

## 2023-09-18 ENCOUNTER — Other Ambulatory Visit: Payer: Self-pay

## 2023-09-20 ENCOUNTER — Other Ambulatory Visit: Payer: Self-pay

## 2023-09-21 ENCOUNTER — Other Ambulatory Visit: Payer: Self-pay

## 2023-09-21 ENCOUNTER — Encounter: Payer: Self-pay | Admitting: Internal Medicine

## 2023-09-21 ENCOUNTER — Other Ambulatory Visit: Payer: Self-pay | Admitting: Internal Medicine

## 2023-09-21 DIAGNOSIS — F419 Anxiety disorder, unspecified: Secondary | ICD-10-CM

## 2023-09-22 ENCOUNTER — Other Ambulatory Visit (HOSPITAL_COMMUNITY): Payer: Self-pay

## 2023-09-22 ENCOUNTER — Other Ambulatory Visit: Payer: Self-pay

## 2023-09-22 ENCOUNTER — Telehealth: Payer: Self-pay | Admitting: Pharmacy Technician

## 2023-09-22 NOTE — Telephone Encounter (Signed)
Response viewed by patient.

## 2023-09-22 NOTE — Telephone Encounter (Signed)
Pharmacy Patient Advocate Encounter   Received notification from CoverMyMeds that prior authorization for Veozah 45MG  tablets is required/requested.   Insurance verification completed.   The patient is insured through [facility] .   Per test claim: PA required; PA submitted to above mentioned insurance via CoverMyMeds Key/confirmation #/EOC [id number] Status is pending

## 2023-09-25 ENCOUNTER — Encounter: Payer: Self-pay | Admitting: *Deleted

## 2023-09-25 ENCOUNTER — Other Ambulatory Visit: Payer: Self-pay

## 2023-09-25 ENCOUNTER — Ambulatory Visit: Payer: 59 | Attending: Internal Medicine | Admitting: Internal Medicine

## 2023-09-25 ENCOUNTER — Ambulatory Visit: Payer: 59 | Admitting: Genetic Counselor

## 2023-09-25 VITALS — BP 110/70 | HR 72 | Ht 63.0 in | Wt 236.0 lb

## 2023-09-25 DIAGNOSIS — I5032 Chronic diastolic (congestive) heart failure: Secondary | ICD-10-CM

## 2023-09-25 DIAGNOSIS — I10 Essential (primary) hypertension: Secondary | ICD-10-CM

## 2023-09-25 DIAGNOSIS — K219 Gastro-esophageal reflux disease without esophagitis: Secondary | ICD-10-CM

## 2023-09-25 DIAGNOSIS — Z006 Encounter for examination for normal comparison and control in clinical research program: Secondary | ICD-10-CM

## 2023-09-25 DIAGNOSIS — I471 Supraventricular tachycardia, unspecified: Secondary | ICD-10-CM

## 2023-09-25 DIAGNOSIS — I422 Other hypertrophic cardiomyopathy: Secondary | ICD-10-CM | POA: Diagnosis not present

## 2023-09-25 MED ORDER — METOPROLOL SUCCINATE ER 100 MG PO TB24
150.0000 mg | ORAL_TABLET | Freq: Every day | ORAL | 3 refills | Status: DC
  Filled 2023-09-25 – 2023-09-26 (×2): qty 135, 90d supply, fill #0

## 2023-09-25 MED ORDER — MAVACAMTEN 5 MG PO CAPS: 5.0000 mg | ORAL_CAPSULE | Freq: Every day | ORAL | 0 refills | Status: DC

## 2023-09-25 NOTE — Progress Notes (Signed)
Cardiology Office Note:  .    Date:  09/25/2023  ID:  [name], DOB 10-07-1975, MRN [id number] PCP: Lula Olszewski, MD  [facility] Providers Cardiologist:  Elder Negus, MD     CC: DOE Consulted for the evaluation of oHCM at the behest of Dr. Rosemary Holms  History of Present Illness: .    Lydia Thompson is a 48 y.o. female with obstructive HCM.  She has asked to record this visit (audio).  Lydia Thompson is a 48 year old female with hypertrophic obstructive cardiomyopathy who presents for symptoms management and hospitalization prevention. She was referred by Dr. Rosemary Holms for evaluation of her hypertrophic obstructive cardiomyopathy.  She has a history of hypertrophic obstructive cardiomyopathy with a maximal septal thickness  18 mm and an inducible gradient of 51 mmHg. She experiences frequent hospitalizations, approximately three to four times, with the most recent being last February due to fluid accumulation in her lungs and heart, leading to shortness of breath. She seeks options to reduce hospital visits and manage her symptoms better.  She reports having only three to four good days in 2025, with most days being symptomatic. She experiences chest pain, particularly after eating, and notes that her breathing worsens with both excessive and insufficient water intake. She is on Lasix, which sometimes helps with fluid management, but occasionally requires hospital intervention for fluid drainage. No recent episodes of syncope or palpitations. Fatigue, especially after meals, and feelings of sluggishness and breathlessness are noted. Stress exacerbates her symptoms, but she manages it with therapy.  She has a history of supraventricular tachycardia and transient ischemic attack. There are no current symptoms of palpitations or arrhythmias reported.  She is currently on semaglutide for morbid obesity. She mentions being thirsty often, despite not being diabetic, with an A1c  of 5.5. She finds it challenging to adhere to dietary recommendations to avoid salt.  Her family history includes a grandmother with heart problems, but no known heart failure or sudden cardiac death in the family.  She has no siblings or children. She recalls using phentermine for weight loss in the past, which she wonders might have contributed to her current condition.  Relevant histories: .  Social - no sons or daughters,  ROS: As per HPI.   Studies Reviewed: .   Cardiac Studies & Procedures      ECHOCARDIOGRAM  ECHOCARDIOGRAM COMPLETE 07/08/2023  Narrative ECHOCARDIOGRAM REPORT    Patient Name:   Lydia Thompson Date of Exam: 07/08/2023 Medical Rec #:  [id number]     Height:       62.0 in Accession #:    [id number]    Weight:       222.9 lb Date of Birth:  [birth year]    BSA:          2.002 m Patient Age:    47 years      BP:           109/74 mmHg Patient Gender: F             HR:           66 bpm. Exam Location:  Inpatient  Procedure: 2D Echo, Color Doppler, Cardiac Doppler and Intracardiac Opacification Agent  Indications:    R06.9 DOE  History:        Patient has prior history of Echocardiogram examinations, most recent 05/29/2022. CHF, Pulmonary HTN; Risk Factors:Hypertension.  Sonographer:    Irving Burton Senior RDCS Referring Phys: [phone] PROSPER M AMPONSAH  IMPRESSIONS   1. 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 Left ventricular ejection fraction, by estimation, is 70 to 75%. The left ventricle has hyperdynamic function. The left ventricle has no regional wall motion abnormalities. There is severe asymmetric left ventricular hypertrophy of the septal segment (basal septum measures 20 mm). Hypertrophied papillary muscles. Left ventricular diastolic parameters are indeterminate. 2. Right ventricular systolic function is normal. The right ventricular size is  normal. Tricuspid regurgitation signal is inadequate for assessing PA pressure. 3. Left atrial size was severely dilated. 4. Systolic anterior motion of the mitral valve in the setting of LVOT obstruction. The mitral valve is grossly normal. Moderate to severe mitral valve regurgitation. No evidence of mitral stenosis. 5. The aortic valve is grossly normal. Aortic valve regurgitation is not visualized. No aortic stenosis is present. 6. The inferior vena cava is normal in size with greater than 50% respiratory variability, suggesting right atrial pressure of 3 mmHg.  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 Left ventricular ejection fraction, by estimation, is 70 to 75%. The left ventricle has hyperdynamic function. The left ventricle has no regional wall motion abnormalities. Definity contrast agent was given IV to delineate the left ventricular endocardial borders. The left ventricular internal cavity size was normal in size. There is severe asymmetric left ventricular hypertrophy of the septal segment. Left ventricular diastolic parameters are indeterminate.  Right Ventricle: The right ventricular size is normal. Right vetricular wall thickness was not well visualized. Right ventricular systolic function is normal. Tricuspid regurgitation signal is inadequate for assessing PA pressure.  Left Atrium: Left atrial size was severely dilated.  Right Atrium: Right atrial size was normal in size.  Pericardium: There is no evidence of pericardial effusion.  Mitral Valve: Systolic anterior motion of the mitral valve in the setting of LVOT obstruction. The mitral valve is grossly normal. Moderate to severe mitral valve regurgitation, with eccentric posteriorly directed jet. No evidence of mitral valve stenosis.  Tricuspid Valve: The tricuspid valve is normal in  structure. Tricuspid valve regurgitation is trivial. No evidence of tricuspid stenosis.  Aortic Valve: The aortic valve is grossly normal. Aortic valve regurgitation is not visualized. No aortic stenosis is present.  Pulmonic Valve: The pulmonic valve was normal in structure. Pulmonic valve regurgitation is trivial. No evidence of pulmonic stenosis.  Aorta: The aortic root and ascending aorta are structurally normal, with no evidence of dilitation.  Venous: The inferior vena cava is normal in size with greater than 50% respiratory variability, suggesting right atrial pressure of 3 mmHg.  IAS/Shunts: The interatrial septum was not well visualized.   LEFT VENTRICLE PLAX 2D LVIDd:         3.80 cm   Diastology LVIDs:         2.40 cm   LV e' medial:    7.40 cm/s LV PW:         1.20 cm   LV E/e' medial:  13.0 LV IVS:        2.00 cm   LV e' lateral:   10.90 cm/s LVOT diam:     2.00 cm   LV E/e' lateral: 8.8 LVOT Area:     3.14 cm   RIGHT VENTRICLE RV S prime:     13.90 cm/s TAPSE (M-mode): 1.8 cm  LEFT ATRIUM  Index        RIGHT ATRIUM           Index LA diam:        5.00 cm  2.50 cm/m   RA Area:     15.80 cm LA Vol (A2C):   87.7 ml  43.81 ml/m  RA Volume:   41.00 ml  20.48 ml/m LA Vol (A4C):   130.0 ml 64.94 ml/m LA Biplane Vol: 112.0 ml 55.95 ml/m  AORTA Ao Root diam: 2.80 cm Ao Asc diam:  3.20 cm  MITRAL VALVE MV Area (PHT): 3.13 cm       SHUNTS MV Decel Time: 242 msec       Systemic Diam: 2.00 cm MR Peak grad:    99.6 mmHg MR Mean grad:    77.0 mmHg MR Vmax:         499.00 cm/s MR Vmean:        427.0 cm/s MR PISA:         2.26 cm MR PISA Eff ROA: 17 mm MR PISA Radius:  0.60 cm MV E velocity: 95.90 cm/s MV A velocity: 65.50 cm/s MV E/A ratio:  1.46  Weston Brass MD Electronically signed by Weston Brass MD Signature Date/Time: 07/08/2023/4:16:34 PM    Final   MONITORS  LONG TERM MONITOR (3-14 DAYS) 08/30/2023  Narrative Zio patch  monitor 13 days 08/03/2023 - 08/17/2023: Dominant rhythm: Sinus. HR 50-121 bpm. Avg HR 74 bpm, in sinus rhythm. 3 episodes of atrial tachycardia, fastest at 154 bpm for 5 beats, longest for 5 beats at 140 bpm. <1% isolated SVE, couplet/triplets. 1 episode of VT, fastest at 182 bpm for 5 bears. <1% isolated VE, couplets. No atrial fibrillation/atrial flutter/VT/high grade AV block, sinus pause >3sec noted. 14 patient triggered events, most correlated with SVE, VE.  CT SCANS  CT CORONARY MORPH W/CTA COR W/SCORE 10/17/2018  Addendum 10/17/2018 12:09 PM ADDENDUM REPORT: 10/17/2018 12:07  CLINICAL DATA:  48 year old female with h/o hypertension, obesity, chest pain and abnormal treadmill stress test.  EXAM: Cardiac/Coronary  CT  TECHNIQUE: The patient was scanned on a [employer].  FINDINGS: A 120 kV prospective scan was triggered in the descending thoracic aorta at 111 HU's. Axial non-contrast 3 mm slices were carried out through the heart. The data set was analyzed on a dedicated work station and scored using the Agatson method. Gantry rotation speed was 250 msecs and collimation was .6 mm. No beta blockade and 0.8 mg of sl NTG was given. The 3D data set was reconstructed in 5% intervals of the 67-82 % of the R-R cycle. Diastolic phases were analyzed on a dedicated work station using MPR, MIP and VRT modes. The patient received 80 cc of contrast.  Aorta:  Normal size.  No calcifications.  No dissection.  Aortic Valve:  Trileaflet.  No calcifications.  Coronary Arteries:  Normal coronary origin.  Right dominance.  RCA is a large dominant artery that gives rise to PDA and PLVB. There is no plaque.  Left main is a large artery that gives rise to LAD and LCX arteries. Left main has no plaque.  LAD is a large vessel that wraps around the apex and has no plaque.  LCX is a non-dominant artery that gives rise to one large OM1 branch. There is no plaque.  Other  findings:  Normal pulmonary vein drainage into the left atrium.  Normal let atrial appendage without a thrombus.  IMPRESSION: 1. Coronary calcium score of 0. This  was 0 percentile for age and sex matched control.  2. Normal coronary origin with right dominance.  3. No evidence of CAD.  4. Moderately dilated pulmonary artery measuring 36 mm suggestive of pulmonary hypertension.   Electronically Signed By: Tobias Alexander On: 10/17/2018 12:07  Narrative EXAM: OVER-READ INTERPRETATION  CT CHEST  The following report is an over-read performed by radiologist Dr. Irish Lack of [facility] on 10/17/2018. This over-read does not include interpretation of cardiac or coronary anatomy or pathology. The coronary CTA interpretation by the cardiologist is attached.  COMPARISON:  None.  FINDINGS: Vascular: Dilated central pulmonary arteries with the main pulmonary artery segment measuring as much as 3.4 cm.  Mediastinum/Nodes: Visualized mediastinum and hilar regions show no evidence of lymphadenopathy or masses.  Lungs/Pleura: Minimal atelectasis at both lung bases. Visualized lungs show no evidence of pulmonary edema, consolidation, pneumothorax, nodule or pleural fluid.  Upper Abdomen: Probable small hiatal hernia.  Musculoskeletal: No chest wall mass or suspicious bone lesions identified.  IMPRESSION: 1. Enlarged central pulmonary arteries are suggestive of underlying pulmonary hypertension. 2. Probable small hiatal hernia.  Electronically Signed: By: Irish Lack M.D. On: 10/17/2018 09:40  CARDIAC MRI  MR CARDIAC MORPHOLOGY W WO CONTRAST 07/26/2023  Narrative CLINICAL DATA:  HCM  EXAM: CARDIAC MRI  TECHNIQUE: The patient was scanned on a 1.5 Tesla Siemens magnet. A dedicated cardiac coil was used. Functional imaging was done using Fiesta sequences. 2,3, and 4 chamber views were done to assess for RWMA's. Modified Simpson's rule using a  short axis stack was used to calculate an ejection fraction on a dedicated work [employer]. The patient received 13 cc of Gadavist. After 10 minutes inversion recovery sequences were used to assess for infiltration and scar tissue. Velocity flow mapping performed in the ascending aorta and main pulmonary artery.  CONTRAST:  13 cc  of Gadavist  FINDINGS: 1. Normal left ventricular size, Severe asymmetric LV basal-septal wall thickness, measuring upto 1.8cm. Normal systolic function (LVEF = 57%).  There are no regional wall motion abnormalities.  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  There is no late gadolinium enhancement in the left ventricular myocardium.  LVEDV: 159 ml  LVESV: 69 ml  SV: 90 ml  CO: 5L/min  Myocardial mass: 145g  2. Normal right ventricular size, thickness and systolic function (RVEF = 54%). There are no regional wall motion abnormalities.  3.  Moderately dilated left atrial size, normal right atrial size.  4. Normal size of the aortic root, ascending aorta and pulmonary artery.  5. Mild mitral regurgitation. no significant valvular abnormalities.  6.  Normal pericardium.  No pericardial effusion.  IMPRESSION: 1. Normal LV size and systolic function, LVEF = 57%.  2. Severe asymmetric LV basal-septal wall thickness, measuring upto 1.8 cm.  3. 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  4. There is no late gadolinium enhancement in the left ventricular myocardium.  5. Normal RV function.  6. 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   Electronically Signed By: Debbe Odea M.D. On: 07/26/2023 18:17          Physical Exam:    VS:  BP 110/70 (BP Location: Right Arm)   Pulse 72   Ht 5\' 3"  (1.6 m)   Wt 236 lb (107 kg)   SpO2 99%   BMI 41.81 kg/m    Wt Readings from Last 3 Encounters:  09/25/23  236  lb (107 kg)  09/06/23 231 lb 9.6 oz (105.1 kg)  09/04/23 231 lb 1.6 oz (104.8 kg)    Gen: no distress, Morbid obesity   Neck: No JVD Cardiac: No Rubs or Gallops, Harsh Systolic Murmur worse with standing, Worse with Valsalva, RRR +2 Respiratory: Clear to auscultation bilaterally, normal effort, normal  respiratory rate GI: Soft, nontender, non-distended  MS: No  edema;  moves all extremities Integument: Skin feels warm Neuro:  At time of evaluation, alert and oriented to person/place/time/situation  Psych: Normal affect, patient feels ok   ASSESSMENT AND PLAN: .    Hypertrophic Obstructive Cardiomyopathy - NYHA II-III based on the day 48 year old with hypertrophic obstructive cardiomyopathy (HOCM), maximal septal thickness 18 mm, inducible gradient 51 mmHg. Symptoms: dyspnea, fatigue, intermittent chest pain, exacerbated by dietary factors and fluid management. Multiple hospitalizations for fluid overload. Currently on metoprolol and Lasix. Discussed treatment options: medication, alcohol septal ablation, open-heart surgery. Patient prefers medication. Explained mavacamten's potential to remodel heart muscle, reducing surgery need. Frequent echocardiograms and potential side effects discussed. Sudden cardiac death risk ~2% over 5 years, defibrillator not warranted (Class II indication) - Stop diltiazem - Increase metoprolol to 150 mg - Start mavacamten 5 mg (will requiring complex drug monitoring with REMS program) - Schedule follow-up in 16 weeks - Perform echocardiograms at 4, 8, and 12 weeks, then every 12 weeks  Supraventricular Tachycardia (SVT) Supraventricular tachycardia (SVT), currently asymptomatic. AV nodal agents as above  Transient Ischemic Attack (TIA) By hx only, no AF, monitor for arrhythmias  Morbid Obesity Morbid obesity managed with semaglutide (Ozempic). Discussed weight loss impact on symptoms and health. Recommended Mediterranean diet. - Encourage adherence  to semaglutide - Recommend Mediterranean diet  GERD Discussed lifestyle modifications for symptom management and health improvement. - Recommend Mediterranean diet; diet may be the reason for some, not all, of her diuretic needs (has some HFpEF physiology), and her post prandial chest pain - Encourage regular physical activity  Follow-up - Follow-up appointment in 16 weeks - Start [place] on October 16, 2023.  Time Spent Directly with Patient:   I have spent a total of 58 minutes with the patient reviewing notes, imaging, EKGs, labs, research options and examining the patient as well as establishing an assessment and plan that was discussed personally with the patient. Discussed disease state education , using shared decision making tools and cardiac modeling , and helping her hear her murmur for the first time. Reviewed care and plan in collaboration with clinical genetics and research.   [name], MD FASE [facility] Cardiologist [facility]  [address], [address] [place], [place] [address] [phone]  2:24 PM

## 2023-09-25 NOTE — Patient Instructions (Signed)
Medication Instructions:  Your physician has recommended you make the following change in your medication:  STOP: diltiazem  INCREASE: metoprolol succinate (Toprol) to 150 mg by mouth once daily.  1 and 1/2 pills   START: mavacamten (Camzyos) 5 mg by mouth once daily.  You are projected to start around Oct 16, 2023.  *If you need a refill on your cardiac medications before your next appointment, please call your pharmacy*   Lab Work: NONE  If you have labs (blood work) drawn today and your tests are completely normal, you will receive your results only by: MyChart Message (if you have MyChart) OR A paper copy in the mail If you have any lab test that is abnormal or we need to change your treatment, we will call you to review the results.   Testing/Procedures: NONE   Follow-Up: At [facility], you and your health needs are our priority.  As part of our continuing mission to provide you with exceptional heart care, we have created designated Provider Care Teams.  These Care Teams include your primary Cardiologist (physician) and Advanced Practice Providers (APPs -  Physician Assistants and Nurse Practitioners) who all work together to provide you with the care you need, when you need it.  We recommend signing up for the patient portal called "MyChart".  Sign up information is provided on this After Visit Summary.  MyChart is used to connect with patients for Virtual Visits (Telemedicine).  Patients are able to view lab/test results, encounter notes, upcoming appointments, etc.  Non-urgent messages can be sent to your provider as well.   To learn more about what you can do with MyChart, go to [email or web address].    Your next appointment:   16 week(s)  Provider:   [name], MD

## 2023-09-25 NOTE — Research (Signed)
I followed up with Lydia Thompson after her visit with Dr. Izora Ribas. The Shared Decision Support Intervention trial was discussed with by myself and Dr. Izora Ribas. She was provided the consent and was given the opportunity to read the consent and ask questions. She provided her consent. Baseline survey questions were completed.

## 2023-09-25 NOTE — Telephone Encounter (Signed)
Pharmacy Patient Advocate Encounter  Received notification from [facility] that Prior Authorization for Veozah 45MG  tablets  has been DENIED.  Full denial letter will be uploaded to the media tab. See denial reason below.     PA #/Case ID/Reference #: [id number]

## 2023-09-26 ENCOUNTER — Telehealth: Payer: Self-pay | Admitting: Pharmacist

## 2023-09-26 ENCOUNTER — Other Ambulatory Visit (HOSPITAL_COMMUNITY): Payer: Self-pay

## 2023-09-26 ENCOUNTER — Other Ambulatory Visit: Payer: Self-pay

## 2023-09-26 ENCOUNTER — Ambulatory Visit: Payer: Self-pay

## 2023-09-26 DIAGNOSIS — I421 Obstructive hypertrophic cardiomyopathy: Secondary | ICD-10-CM

## 2023-09-26 NOTE — Progress Notes (Cosign Needed)
Pre Test Genetic Consult  Referral Reason  Lydia Thompson, a new HCM patient, is referred for genetic consult and testing of hypertrophic cardiomyopathy.   Personal Medical Information Lydia Thompson (III.1 on pedigree) is 48 y.o. 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 She reports being diagnosed with heart failure in February 2018 after experiencing severe chest pain that required emergency care. The patient was hospitalized for about a week and found to have significant fluid on the lungs. Symptoms include shortness of breath and fatigue, especially during physical activities, occurring a few times a week. Patient has experienced dizziness and lightheadedness, particularly during exertion. The last hospital visit was in December for fluid drainage.   She reports being diagnosed with HCM sometime after her initial hospitalization in 2018. A cardiac MRI of 07/31/23 detected severe asymmetric septal hypertrophy with basal septal thickness of 1.8 cm, SAM and LVOTO, LVEF of 57% and no scar burden.  Patient experiences shortness of breath, fatigue, dizziness, and lightheadedness, especially during physical activities and occasional chest discomfort. Denies syncope    Traditional Risk Factors Lydia Thompson reports being diagnosed with HTN in her 30s that has not been well controlled until the last 1 year.  Family history Patient has a stepson who is not biologically related.   She is the only daughter of her parents. Her mother (II.1) died at 29 from cancer, no heart issues noted. She has two paternal uncles- one is a special needs individual (II.2) and the other died suddenly at age 33 (II.4). He reportedly collapsed and died after cutting grass. An autopsy was not performed to confirm cause of death.  Maternal grandmother had heart problems- details unknown. Both maternal grandparents and maternal aunt died of pulmonary disease arising from their heavy smoking.  She is estranged from her  father and is not familiar with his history or that of his relatives.  Genetics Lydia Thompson was counseled on the genetics of hypertrophic cardiomyopathy (HCM). I explained to the patient that this is an autosomal dominant condition with incomplete penetrance i.e. not all individuals harboring the HCM mutation will present clinically with HCM, and age-related penetrance where clinical presentation of HCM increases with advanced age. Variability in clinical expression is also seen in families with HCM with affected family members presenting clinically at different ages and with symptoms ranging from mild to severe.  Since HCM is an autosomal dominant condition, first degree-relatives are at a 50% risk of inheriting this condition. They should seek regular surveillance for HCM. She does not children or full siblings. Mention s having a maternal half-sister, age 20, but she is not in touch with her.    Clinical screening of first-degree relatives involves echocardiogram and EKG at regular intervals, frequency is typically determined by age, with children undergoing screening every year until the age of 29 and those over the age of 31 getting screened every 3-5 years until the age of 68. Patient verbalized understanding of this.  Also briefly discussed the inheritance pattern and treatment /management plans for the infiltrative cardiomyopathies that present as HCM phenocopies. About 8-10% of HCM patients can have compound and digenic sarcomeric mutations for HCM  Patient should be aware that genetic testing is a probabilistic test dependent upon age and severity of presentation, presence of risk factors for HCM and importantly family history of HCM or sudden death in first-degree relatives. The potential outcomes of genetic testing and subsequent management of at-risk family members is listed below-  If a mutation is  not identified then it is important that he understands that HCM is a genetic condition and can be  passed down to his children. All first-degree relatives should undergo regular screening for HCM.  A negative test result can be due to limitations of the genetic test.   There is also the likelihood of identifying a "Variant of unknown significance". This result means that the variant has not been detected in a statistically significant number of HCM patients and/or functional studies have not been performed to verify its pathogenicity. This VUS can be tested in the family to see if it segregates with disease. If a VUS is found, first-degree relatives should undergo regular clinical screening for HCM, but genetic testing for the VUS is otherwise not warranted.  If a pathogenic variant is reported, then first-degree family members can get tested for this variant. If they test positive, it is likely they will develop HCM. In light of variable expression and incomplete penetrance associated with HCM, it is not possible to predict when they will manifest clinically with HCM. It is recommended that family members that test positive for the familial pathogenic variant pursue clinical screening for HCM. Family members that test negative for the familial mutation need not pursue periodic screening for HCM, but seek care if symptoms develop.   Impression  Lydia Thompson was found to have cardiac wall thickness suggestive of HCM at age 74 in the presence of other cardiac loading conditions such as HTN that can lead to cardiac hypertrophy. There is limited family history and no reports of HCM in her maternal relatives. However, she reports sudden death in a maternal uncle. It is likely she has a de novo mutation for HCM or has inherited this from one of her parents with evident reduced penetrance.  Genetic testing is recommended to confirm her diagnosis. This test should include the major sarcomeric genes involved in HCM, namely MYBPPC3, MYH7, TNNI3, TNNT2, TPM1, ACTC1, MYL2 and MYL3. It should also include the genes involved  in HCM phenocopies as cardiac-predominant forms of these conditions present clinically as HCM. These include genes for Fabry disease (GLA), Danon disease (LAMP2), WPW syndrome (PRKAG2), Familial transthyretin amyloidosis (TTR) and phospholamban (PLN).  In addition, patient should be aware of protections afforded by the Genetic Information Non-Discrimination Act (GINA). GINA protects a patient from losing their employment or health insurance based on their genotype. However, these protections do not cover life insurance and disability. Explained to the patient that family members that are found to have the familial genetic mutation will be denied life insurance even if they are asymptomatic and do not exhibit clinical signs of HCM. She verbalized understanding.   Please note that the patient has not been counseled in this visit on other personal, cultural or ethical issues that she may face due to her heart condition.   Plan After a thorough discussion of the risk and benefits of genetic testing for HCM, Lydia Thompson declines genetic testing as she does not have children or siblings that could benefit from this test.   Lydia Thompson, Ph.D, [facility] Clinical Molecular Geneticist

## 2023-09-26 NOTE — Patient Instructions (Signed)
Visit Information  Thank you for taking time to visit with me today. Please don't hesitate to contact me if I can be of assistance to you.   Following are the goals we discussed today:   Goals Addressed             This Visit's Progress    Manage Heart Failure       Care Coordination Interventions: Basic overview and discussion of pathophysiology of Heart Failure reviewed Provided education on low sodium diet Provided education about placing scale on hard, flat surface Advised patient to weigh each morning after emptying bladder Discussed importance of daily weight and advised patient to weigh and record daily Reviewed role of diuretics in prevention of fluid overload and management of heart failure; Discussed the importance of keeping all appointments with provider   Spoke with patient. She reports feeling good today.  She saw cardiology on yesterday and was started on Camzyos for her heart.  She is hoping this helps to help her control her heart failure and keep her out of the hospital.  Her current weight is 230 lbs. She is aware of weight parameters and daily weights.  Discussed heart failure and the importance of limiting salt and meals from restaurants.  She verbalized understanding.  She states she feels good mentally.  She reports she sees a therapist and had recent appointment.  She is agreeable to RN CM calls for support and education.  No RN CM concerns.           Our next appointment is by telephone on 10/24/23 at 130 pm  Please call the care guide team at [phone] if you need to cancel or reschedule your appointment.   If you are experiencing a Mental Health or Behavioral Health Crisis or need someone to talk to, please call the Suicide and Crisis Lifeline: 988   Patient verbalizes understanding of instructions and care plan provided today and agrees to view in MyChart. Active MyChart status and patient understanding of how to access instructions and care plan via  MyChart confirmed with patient.     The patient has been provided with contact information for the care management team and has been advised to call with any health related questions or concerns.   Bary Leriche RN, MSN [facility], [facility] RN Care Manager Direct Dial: [phone]  Fax: [phone] Website: [email or web address]

## 2023-09-26 NOTE — Patient Outreach (Signed)
  Care Coordination   Initial Visit Note   09/26/2023 Name: Lydia Thompson MRN: [id number] DOB: [birth year]  Lydia Thompson is a 48 y.o. year old female who sees Jesus Bernardino MATSU, MD for primary care. I spoke with  Ellouise CHRISTELLA Gilmore by phone today.  What matters to the patients health and wellness today?  Manage her heart failure    Goals Addressed             This Visit's Progress    Manage Heart Failure       Care Coordination Interventions: Basic overview and discussion of pathophysiology of Heart Failure reviewed Provided education on low sodium diet Provided education about placing scale on hard, flat surface Advised patient to weigh each morning after emptying bladder Discussed importance of daily weight and advised patient to weigh and record daily Reviewed role of diuretics in prevention of fluid overload and management of heart failure; Discussed the importance of keeping all appointments with provider   Spoke with patient. She reports feeling good today.  She saw cardiology on yesterday and was started on Camzyos  for her heart.  She is hoping this helps to help her control her heart failure and keep her out of the hospital.  Her current weight is 230 lbs. She is aware of weight parameters and daily weights.  Discussed heart failure and the importance of limiting salt and meals from restaurants.  She verbalized understanding.  She states she feels good mentally.  She reports she sees a therapist and had recent appointment.  She is agreeable to RN CM calls for support and education.  No RN CM concerns.           SDOH assessments and interventions completed:  Yes  SDOH Interventions Today    Flowsheet Row Most Recent Value  SDOH Interventions   Food Insecurity Interventions Intervention Not Indicated  Housing Interventions Intervention Not Indicated  Transportation Interventions Intervention Not Indicated  Utilities Interventions Intervention Not Indicated  Health Literacy  Interventions Intervention Not Indicated        Care Coordination Interventions:  Yes, provided   Follow up plan: Follow up call scheduled for March    Encounter Outcome:  Patient Visit Completed   Arrietty Dercole J. Caresse Sedivy RN, MSN [facility], [facility] RN Care Manager Direct Dial: [phone]  Fax: [phone] Website: [email or web address]

## 2023-09-26 NOTE — Telephone Encounter (Signed)
PA for Camzyos submitted Key: [id number] Start form faxed

## 2023-09-27 ENCOUNTER — Other Ambulatory Visit (HOSPITAL_COMMUNITY): Payer: Self-pay

## 2023-09-27 ENCOUNTER — Other Ambulatory Visit: Payer: Self-pay

## 2023-09-28 NOTE — Telephone Encounter (Signed)
 Patient enrolled in copay assistance for Echos. Medication PA still pending.

## 2023-09-29 ENCOUNTER — Ambulatory Visit: Payer: Self-pay | Admitting: Licensed Clinical Social Worker

## 2023-09-29 NOTE — Patient Outreach (Signed)
  Care Coordination   09/29/2023 Name: Lydia Thompson MRN: [id number] DOB: 26-Jun-1976   Care Coordination Outreach Attempts:  An unsuccessful outreach was attempted for an appointment today.  Follow Up Plan:  Additional outreach attempts will be made to offer the patient complex care management information and services.   Encounter Outcome:  No Answer   Care Coordination Interventions:  No, not indicated    This was the first follow up call, SW will schedule again to call the patient  Tobias CHARM Maranda HEDWIG, PhD [facility], [facility] Social Worker Direct Dial: [phone]  Fax: [phone]

## 2023-10-02 ENCOUNTER — Other Ambulatory Visit: Payer: Self-pay

## 2023-10-02 NOTE — Telephone Encounter (Signed)
 Camzyos  approved through 12/27/23. Still waiting for Covermymeds to confirm cost, but I imagine she can use copay card to make it $10

## 2023-10-03 ENCOUNTER — Telehealth: Payer: Self-pay

## 2023-10-03 NOTE — Telephone Encounter (Signed)
Copied from CRM [id number]. Topic: General - Other >> Oct 03, 2023 10:31 AM [place] A wrote: Reason for CRM: Patient called because she would like for her hot flash medication to be sent to [facility]

## 2023-10-03 NOTE — Telephone Encounter (Signed)
I called patient to verify medication and she would like a Rx of Veozah 45mg  sent to [facility].  Requesting: Veozah 45mg  Last Visit: 09/06/2023 Next Visit: 12/11/2023 Last Refill: 08/29/2023  Please Advise

## 2023-10-04 ENCOUNTER — Other Ambulatory Visit: Payer: Self-pay

## 2023-10-04 DIAGNOSIS — N951 Menopausal and female climacteric states: Secondary | ICD-10-CM

## 2023-10-04 MED ORDER — VEOZAH 45 MG PO TABS: 1.0000 | ORAL_TABLET | Freq: Every day | ORAL | 11 refills | Status: DC

## 2023-10-04 NOTE — Telephone Encounter (Signed)
Patient has been informed of this information.

## 2023-10-04 NOTE — Telephone Encounter (Signed)
Patient was notified also.

## 2023-10-04 NOTE — Telephone Encounter (Signed)
Medication was sent in to the pharmacy yesterday.

## 2023-10-04 NOTE — Telephone Encounter (Signed)
Noted

## 2023-10-04 NOTE — Telephone Encounter (Signed)
I called patient and left her a message that Rx of Allyne Gee was approved and sent to [facility].

## 2023-10-05 NOTE — Telephone Encounter (Signed)
Her pharmacy benefits investigation came back saying she has [employer]. This is incorrect. I have spoken to [facility] and asked her to have them re-run. She has Nurse, learning disability. I tried to enroll her in a copay card, but it says duplicate records. I did call patient. She says she has not signed up for a copay card. Someone from the company was in contact with her, but she doesn't know anything about a copay card. Once her benefits investigation is fixed I will sort out the copay card.

## 2023-10-09 ENCOUNTER — Other Ambulatory Visit (HOSPITAL_COMMUNITY): Payer: Self-pay

## 2023-10-10 ENCOUNTER — Other Ambulatory Visit: Payer: Self-pay

## 2023-10-11 ENCOUNTER — Encounter: Payer: Self-pay | Admitting: Internal Medicine

## 2023-10-11 NOTE — Telephone Encounter (Signed)
 Patient is following up. Trial med will be delivered on Thursday and she would like to know if she needs to start taking it right away. Please advise.

## 2023-10-12 ENCOUNTER — Ambulatory Visit: Payer: Self-pay | Admitting: Licensed Clinical Social Worker

## 2023-10-12 NOTE — Patient Outreach (Signed)
  Care Coordination   Follow Up Visit Note   10/12/2023 Name: Lydia Thompson MRN: [id number] DOB: 1976/07/01  Lydia Thompson is a 48 y.o. year old female who sees Lula Olszewski, MD for primary care. I engaged with Fransisca Kaufmann in the providers office today.  What matters to the patients health and wellness today?  Food Insecurities and medication resources     Goals Addressed             This Visit's Progress    COMPLETED: Care Coordination Activities       Care Coordination Interventions: Patient stated that she has lots of stress due to her husband being sick and herself, she stated that it was a lot and that she has to miss work to take him to appointments but her job understands, The SW educated and offered to assist with a SCAT application, but the patient stated that would rather take her husband to his appointments because he does not always take in the information correctly from his appointments. Patient stated that she works and has never applied for [employer] benefits but would like to see if she can be approved, SW will also send the link and the food pantry resources via email to the patient. Patient also wants to be referred to a LCSW for stress and depression to life issue and to a RN to talk about health related issues. Patient received all resources the family is now receiving SNAP benefits $500 each month and the patient is in a trial program to where she is receiving her heart medication for free.  No other follow up needed.          SDOH assessments and interventions completed:  Yes  SDOH Interventions Today    Flowsheet Row Most Recent Value  SDOH Interventions   Food Insecurity Interventions Intervention Not Indicated  Housing Interventions Intervention Not Indicated  Transportation Interventions Intervention Not Indicated  Utilities Interventions Intervention Not Indicated        Care Coordination Interventions:  Yes, provided  Interventions Today     Flowsheet Row Most Recent Value  General Interventions   General Interventions Discussed/Reviewed General Interventions Reviewed, Community Resources  [All resources mailed were utilized and there are no other needs.]        Follow up plan: No further intervention required.   Encounter Outcome:  Patient Visit Completed   Jeanie Cooks, PhD [facility], [facility] Social Worker Direct Dial: [phone]  Fax: [phone]

## 2023-10-12 NOTE — Patient Instructions (Signed)
 Visit Information  Thank you for taking time to visit with me today. Please don't hesitate to contact me if I can be of assistance to you.   Following are the goals we discussed today:   Goals Addressed             This Visit's Progress    COMPLETED: Care Coordination Activities       Care Coordination Interventions: Patient stated that she has lots of stress due to her husband being sick and herself, she stated that it was a lot and that she has to miss work to take him to appointments but her job understands, The SW educated and offered to assist with a SCAT application, but the patient stated that would rather take her husband to his appointments because he does not always take in the information correctly from his appointments. Patient stated that she works and has never applied for [employer] benefits but would like to see if she can be approved, SW will also send the link and the food pantry resources via email to the patient. Patient also wants to be referred to a LCSW for stress and depression to life issue and to a RN to talk about health related issues. Patient received all resources the family is now receiving SNAP benefits $500 each month and the patient is in a trial program to where she is receiving her heart medication for free.  No other follow up needed.          No other follow up needed, SW encouraged the patient to  contact the PCP if any SDOH needs arise.   Please call the care guide team at [phone] if you need to cancel or reschedule your appointment.   If you are experiencing a Mental Health or Behavioral Health Crisis or need someone to talk to, please call the Suicide and Crisis Lifeline: 988 go to [facility] Urgent [facility] [address] [place] [phone]) call 911  Patient verbalizes understanding of instructions and care plan provided today and agrees to view in MyChart. Active MyChart status and patient understanding of how to  access instructions and care plan via MyChart confirmed with patient.     Jeanie Cooks, PhD [facility], [facility] Social Worker Direct Dial: [phone]  Fax: [phone]

## 2023-10-13 ENCOUNTER — Other Ambulatory Visit: Payer: Self-pay

## 2023-10-13 DIAGNOSIS — I10 Essential (primary) hypertension: Secondary | ICD-10-CM

## 2023-10-13 DIAGNOSIS — I422 Other hypertrophic cardiomyopathy: Secondary | ICD-10-CM

## 2023-10-13 DIAGNOSIS — I5032 Chronic diastolic (congestive) heart failure: Secondary | ICD-10-CM

## 2023-10-13 MED ORDER — FUROSEMIDE 20 MG PO TABS: 20.0000 mg | ORAL_TABLET | Freq: Two times a day (BID) | ORAL | 3 refills | Status: DC

## 2023-10-13 MED ORDER — SPIRONOLACTONE 25 MG PO TABS: 25.0000 mg | ORAL_TABLET | Freq: Every day | ORAL | 3 refills | Status: DC

## 2023-10-13 MED ORDER — METOPROLOL SUCCINATE ER 100 MG PO TB24: 150.0000 mg | ORAL_TABLET | Freq: Every day | ORAL | 3 refills | Status: DC

## 2023-10-17 ENCOUNTER — Encounter: Payer: 59 | Attending: Internal Medicine | Admitting: Skilled Nursing Facility1

## 2023-10-17 ENCOUNTER — Telehealth: Payer: Self-pay | Admitting: Internal Medicine

## 2023-10-17 ENCOUNTER — Encounter: Payer: Self-pay | Admitting: Skilled Nursing Facility1

## 2023-10-17 ENCOUNTER — Other Ambulatory Visit: Payer: Self-pay

## 2023-10-17 VITALS — Ht 63.0 in | Wt 230.0 lb

## 2023-10-17 DIAGNOSIS — I10 Essential (primary) hypertension: Secondary | ICD-10-CM | POA: Diagnosis present

## 2023-10-17 NOTE — Progress Notes (Signed)
 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 Labs: see labs Notable Signs/Symptoms: shortness of breath if eats a heavy meal   Lifestyle & Dietary Hx  Pt states her doctor suggested she use [employer] weight loss program which involves taking weight loss medication [facility]).    Pt states she has been juicing.    Body Composition Scale 09/04/2023 09/27/2023  Current Body Weight 231.1 229.5  Total Body Fat % 44 44.8  Visceral Fat 15 15  Fat-Free Mass % 55.9 55.1   Total Body Water % 42.4 42  Muscle-Mass lbs 30.8 29.7  BMI 40.5 40.3  Body Fat Displacement           Torso  lbs 63 63.8         Left Leg  lbs 12.6 12.7         Right Leg  lbs 12.6 12.7         Left Arm  lbs 6.3 6.3         Right Arm   lbs 6.3 6.3     Estimated daily fluid intake: pt states her doctor advised she get 101 oz in phsycians note recommended 2-3L per day  Supplements: none reported  Sleep: struggles to fall asleep  Stress / self-care: currently high due to own health condition, her husbands late stage lung cancer, in the processes of adopting her husbands 51 year old son, unsupportive/overly critical husband Current average weekly physical activity: ADL's  24-Hr Dietary Recall First Meal: peporoni and cheese and yogurt and skipped or boiled gge Snack:  Second Meal: 2 grilled cheeses or fast food Snack:  Third Meal: fast food or baked chicken + pintos + mac n cheese or mashed potatoes and [place] leg Snack: flax seed, lemon, water, honey, Flavoring and candy Beverages: soda  Estimated Energy Needs Calories: 1600-1900   NUTRITION DIAGNOSIS  Imbalanced nutrition: more than body  requirements related to excessive calorie intake and low physical activity as evidenced by obesity (BMI >=30) and 24 hr recall  NUTRITION INTERVENTION  Nutrition education (E-1) on the following topics:  Creation of balanced and diverse meals to increase the intake of nutrient-rich foods that provide essential vitamins, minerals, fiber, and phytonutrients Variety of Fruits and Vegetables: Aim for a colorful array of fruits and vegetables to ensure a wide range of nutrients. Include a mix of leafy greens, berries, citrus fruits, cruciferous vegetables, and more. Whole Grains: Choose whole grains over refined grains. Examples include brown rice, quinoa, oats, whole wheat, and barley. Lean Proteins: Include lean sources of protein, such as poultry, fish, tofu, legumes, beans, lentils, and low-fat dairy products. Limit red and processed meats. Healthy Fats: Incorporate sources of healthy fats, including avocados, nuts, seeds, and olive oil. Limit saturated and trans fats found in fried and processed foods. Dairy or Dairy Alternatives: Choose low-fat or fat-free dairy products, or plant-based alternatives like almond or soy milk. Portion Control: Be mindful of portion sizes to avoid overeating. Pay attention to hunger and satisfaction cues. Limit Added Sugars: Minimize the consumption of sugary beverages, snacks, and desserts. Check food labels for added sugars and opt for natural sources of sweetness such as whole fruits. Hydration: Drink plenty of water  throughout the day. Limit sugary drinks and excessive caffeine intake. Moderate Sodium Intake: Reduce the consumption of high-sodium foods. Use herbs and spices for flavor instead of excessive salt. Meal Planning and Preparation: Plan and prepare meals ahead of time to make healthier choices more convenient. Include a mix of food groups in each meal. Limit Processed Foods: Minimize the intake of highly processed and packaged foods that are  often high in added sugars, salt, and unhealthy fats. Regular Physical Activity: Combine a healthy diet with regular physical activity for overall well-being. Aim for at least 150 minutes of moderate-intensity aerobic exercise per week, along with strength training. Moderation and Balance: Enjoy treats and indulgent foods in moderation, emphasizing balance rather than strict restriction.  Handouts Previously Provided Include  Detailed MyPlate Use of AI for recipe creation and use of ingredients already in the home  Learning Style & Readiness for Change Teaching method utilized: Visual & Auditory  Demonstrated degree of understanding via: Teach Back  Barriers to learning/adherence to lifestyle change: pt states her husband is not supportive and overly criticizes her cooking and cleaning   Goals Established by Pt Continue: I will sleep in the living room at 9:30 Continue: I will meditate for 5 minutes after the shower    MONITORING & EVALUATION Dietary intake, weekly physical activity

## 2023-10-17 NOTE — Telephone Encounter (Signed)
*  STAT* If patient is at the pharmacy, call can be transferred to refill team.   1. Which medications need to be refilled? (please list name of each medication and dose if known)   mavacamten (CAMZYOS) 5 MG CAPS capsule   2. Would you like to learn more about the convenience, safety, & potential cost savings by using the [facility]?   3. Are you open to using the [facility] (Type [facility]. ).  4. Which pharmacy/location (including street and city if local pharmacy) is medication to be sent to?  [facility], [place] - [address]   5. Do they need a 30 day or 90 day supply?   30 day

## 2023-10-17 NOTE — Telephone Encounter (Signed)
 Pt reports on 10/13/23 that medication was received.

## 2023-10-17 NOTE — Telephone Encounter (Signed)
 Dr. Debby Bud Pt is requesting a refill of Camzyos. Does Dr. Izora Ribas want to refill? Please advise.

## 2023-10-18 ENCOUNTER — Emergency Department (HOSPITAL_BASED_OUTPATIENT_CLINIC_OR_DEPARTMENT_OTHER)
Admission: EM | Admit: 2023-10-18 | Discharge: 2023-10-18 | Disposition: A | Discharge status: Home / Self Care | Payer: 59 | Attending: Emergency Medicine | Admitting: Emergency Medicine

## 2023-10-18 ENCOUNTER — Other Ambulatory Visit: Payer: Self-pay

## 2023-10-18 DIAGNOSIS — R197 Diarrhea, unspecified: Secondary | ICD-10-CM | POA: Insufficient documentation

## 2023-10-18 DIAGNOSIS — Z79899 Other long term (current) drug therapy: Secondary | ICD-10-CM | POA: Insufficient documentation

## 2023-10-18 DIAGNOSIS — Z7982 Long term (current) use of aspirin: Secondary | ICD-10-CM | POA: Insufficient documentation

## 2023-10-18 DIAGNOSIS — I110 Hypertensive heart disease with heart failure: Secondary | ICD-10-CM | POA: Diagnosis not present

## 2023-10-18 DIAGNOSIS — I509 Heart failure, unspecified: Secondary | ICD-10-CM | POA: Diagnosis not present

## 2023-10-18 DIAGNOSIS — R112 Nausea with vomiting, unspecified: Secondary | ICD-10-CM | POA: Diagnosis present

## 2023-10-18 LAB — URINALYSIS, ROUTINE W REFLEX MICROSCOPIC
Bilirubin Urine: NEGATIVE
Glucose, UA: NEGATIVE mg/dL
Hgb urine dipstick: NEGATIVE
Ketones, ur: NEGATIVE mg/dL
Nitrite: NEGATIVE
Protein, ur: NEGATIVE mg/dL
Specific Gravity, Urine: 1.015 (ref 1.005–1.030)
pH: 5.5 (ref 5.0–8.0)

## 2023-10-18 LAB — COMPREHENSIVE METABOLIC PANEL
ALT: 17 U/L (ref 0–44)
AST: 28 U/L (ref 15–41)
Albumin: 4.7 g/dL (ref 3.5–5.0)
Alkaline Phosphatase: 74 U/L (ref 38–126)
Anion gap: 7 (ref 5–15)
BUN: 18 mg/dL (ref 6–20)
CO2: 28 mmol/L (ref 22–32)
Calcium: 10.3 mg/dL (ref 8.9–10.3)
Chloride: 102 mmol/L (ref 98–111)
Creatinine, Ser: 0.8 mg/dL (ref 0.44–1.00)
GFR, Estimated: >60 mL/min (ref >60)
Glucose, Bld: 83 mg/dL (ref 70–99)
Potassium: 4.7 mmol/L (ref 3.5–5.1)
Sodium: 137 mmol/L (ref 135–145)
Total Bilirubin: 0.6 mg/dL (ref 0.0–1.2)
Total Protein: 9.5 g/dL — ABNORMAL HIGH (ref 6.5–8.1)

## 2023-10-18 LAB — CBC
HCT: 47.8 % — ABNORMAL HIGH (ref 36.0–46.0)
Hemoglobin: 15.7 g/dL — ABNORMAL HIGH (ref 12.0–15.0)
MCH: 27.2 pg (ref 26.0–34.0)
MCHC: 32.8 g/dL (ref 30.0–36.0)
MCV: 82.7 fL (ref 80.0–100.0)
Platelets: 431 10*3/uL — ABNORMAL HIGH (ref 150–400)
RBC: 5.78 MIL/uL — ABNORMAL HIGH (ref 3.87–5.11)
RDW: 13.9 % (ref 11.5–15.5)
WBC: 13.8 10*3/uL — ABNORMAL HIGH (ref 4.0–10.5)
nRBC: 0 % (ref 0.0–0.2)

## 2023-10-18 LAB — RESP PANEL BY RT-PCR (RSV, FLU A&B, COVID)  RVPGX2
Influenza A by PCR: NEGATIVE
Influenza B by PCR: NEGATIVE
Resp Syncytial Virus by PCR: NEGATIVE
SARS Coronavirus 2 by RT PCR: NEGATIVE

## 2023-10-18 LAB — LIPASE, BLOOD: Lipase: 23 U/L (ref 11–51)

## 2023-10-18 MED ORDER — ONDANSETRON 4 MG PO TBDP
4.0000 mg | ORAL_TABLET | Freq: Three times a day (TID) | ORAL | 0 refills | Status: AC | PRN
  Filled 2023-10-18 – 2023-11-02 (×2): qty 20, 7d supply, fill #0

## 2023-10-18 MED ORDER — LACTATED RINGERS IV BOLUS
1000.0000 mL | Freq: Once | INTRAVENOUS | Status: AC
  Administered 2023-10-18: 1000 mL via INTRAVENOUS

## 2023-10-18 MED ORDER — ONDANSETRON HCL 4 MG/2ML IJ SOLN
4.0000 mg | Freq: Once | INTRAMUSCULAR | Status: AC
  Administered 2023-10-18: 4 mg via INTRAVENOUS
  Filled 2023-10-18: qty 2

## 2023-10-18 NOTE — Discharge Instructions (Addendum)
 You were seen in the emergency department today for concerns of nausea and vomiting.  Your labs were all thankfully reassuring.  I suspect your symptoms may be secondary to your semaglutide use for weight loss.  This is an expected reaction and you can sometimes improve the side effects by ensuring that you are injecting into the thighs as opposed to the abdomen.  I would recommend following up with your primary care provider for repeat evaluation to ensure that you are still tolerating this medication well.  I sent a prescription for Zofran to your pharmacy to help continue manage her nausea at home.  For any new or worsening symptoms, return the emergency department.

## 2023-10-18 NOTE — ED Provider Notes (Signed)
 [facility] AT [facility] Provider Note   CSN: [id number] Arrival date & time: 10/18/23  1140     History Chief Complaint  Patient presents with   Emesis    Lydia Thompson is a 48 y.o. female.  Patient with past history significant for morbid obesity, chronic heart failure, pulmonary hypertension, SVT presents ED with concerns of nausea and vomiting.  Reports that he started experiencing nausea, vomiting, diarrhea earlier today.  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  Does also report that about 2 to 3 weeks ago she started on semaglutide for weight loss.  Did endorse some noticeable nausea after going up on her dose slightly and feels her nausea began after she injected into her abdomen a repeat weekly dose.  Denies any hematemesis, hematochezia, or melanotic stools.   Emesis      Home Medications Prior to Admission medications   Medication Sig Start Date End Date Taking? Authorizing Provider  ondansetron (ZOFRAN-ODT) 4 MG disintegrating tablet Take 1 tablet (4 mg total) by mouth every 8 (eight) hours as needed for nausea or vomiting. 10/18/23  Yes Smitty Knudsen, PA-C  aspirin EC 81 MG tablet Take 1 tablet (81 mg total) by mouth daily. Swallow whole. 09/01/22   Patwardhan, Anabel Bene, MD  DULoxetine (CYMBALTA) 60 MG capsule Take 1 capsule (60 mg total) by mouth daily. Replaces 30 mg dose 07/24/23   Lula Olszewski, MD  estradiol (ESTRACE) 0.1 MG/GM vaginal cream Limit use to minimum amount to control symptoms. Patient taking differently: Place 1 Applicatorful vaginally as needed (dryness). 08/30/22   Lula Olszewski, MD  Fezolinetant [facility]) 45 MG TABS Take 1 tablet (45 mg total) by mouth daily at 6 (six) AM. 10/04/23   Lula Olszewski, MD  furosemide (LASIX) 20 MG tablet Take 1 tablet (20 mg total) by mouth 2 (two) times daily. 10/13/23   Patwardhan, Anabel Bene, MD  gabapentin (NEURONTIN) 300 MG capsule Take 1 capsule (300  mg total) by mouth 3 (three) times daily. 09/06/23   Lula Olszewski, MD  LORazepam (ATIVAN) 0.5 MG tablet Take 1 tablet (0.5 mg total) by mouth 2 (two) times daily as needed for anxiety. 07/24/23   Lula Olszewski, MD  mavacamten (CAMZYOS) 5 MG CAPS capsule Take 1 capsule (5 mg total) by mouth daily. 09/25/23   [name] A, MD  metoprolol succinate (TOPROL-XL) 100 MG 24 hr tablet Take 1.5 tablets (150 mg total) by mouth daily. Take with or immediately following a meal. 10/13/23   Patwardhan, Manish J, MD  spironolactone (ALDACTONE) 25 MG tablet Take 1 tablet (25 mg total) by mouth daily. 10/13/23   Patwardhan, Anabel Bene, MD      Allergies    Patient has no known allergies.    Review of Systems   Review of Systems  Gastrointestinal:  Positive for vomiting.  All other systems reviewed and are negative.   Physical Exam Updated Vital Signs BP 106/70   Pulse 77   Temp 98.5 F (36.9 C) (Oral)   Resp 20   Ht 5\' 3"  (1.6 m)   Wt 103 kg   SpO2 97%   BMI 40.21 kg/m  Physical Exam Vitals and nursing note reviewed.  Constitutional:      General: She is not in acute distress.    Appearance: She is well-developed.  HENT:     Head: Normocephalic and atraumatic.  Eyes:  Conjunctiva/sclera: Conjunctivae normal.  Cardiovascular:     Rate and Rhythm: Normal rate and regular rhythm.     Heart sounds: No murmur heard. Pulmonary:     Effort: Pulmonary effort is normal. No respiratory distress.     Breath sounds: Normal breath sounds.  Abdominal:     Palpations: Abdomen is soft.     Tenderness: There is no abdominal tenderness.  Musculoskeletal:        General: No swelling.     Cervical back: Neck supple.  Skin:    General: Skin is warm and dry.     Capillary Refill: Capillary refill takes less than 2 seconds.  Neurological:     Mental Status: She is alert.  Psychiatric:        Mood and Affect: Mood normal.     ED Results / Procedures / Treatments   Labs (all labs  ordered are listed, but only abnormal results are displayed) Labs Reviewed  COMPREHENSIVE METABOLIC PANEL - Abnormal; Notable for the following components:      Result Value   Total Protein 9.5 (*)    All other components within normal limits  CBC - Abnormal; Notable for the following components:   WBC 13.8 (*)    RBC 5.78 (*)    Hemoglobin 15.7 (*)    HCT 47.8 (*)    Platelets 431 (*)    All other components within normal limits  URINALYSIS, ROUTINE W REFLEX MICROSCOPIC - Abnormal; Notable for the following components:   Leukocytes,Ua TRACE (*)    Bacteria, UA MANY (*)    All other components within normal limits  RESP PANEL BY RT-PCR (RSV, FLU A&B, COVID)  RVPGX2  LIPASE, BLOOD    EKG None  Radiology No results found.  Procedures Procedures    Medications Ordered in ED Medications  lactated ringers bolus 1,000 mL (1,000 mLs Intravenous New Bag/Given 10/18/23 1509)  ondansetron (ZOFRAN) injection 4 mg (4 mg Intravenous Given 10/18/23 1509)    ED Course/ Medical Decision Making/ A&P                                 Medical Decision Making Amount and/or Complexity of Data Reviewed Labs: ordered.  Risk Prescription drug management.   This patient presents to the ED for concern of nausea vomiting.  Differential diagnosis includes medication side effect, gastroenteritis, norovirus, bowel obstruction   Lab Tests:  I Ordered, and personally interpreted labs.  The pertinent results include: CBC with leukocytosis, elevated hemoglobin at 15.7, and thrombocytosis at 431.  No prior history of polycythemia.  Possible hemoconcentration.  UA with trace leukocytes and bacteria seen but patient is asymptomatic.  CMP unremarkable with no significant dehydration seen.  No acute.  Lipase normal.  Respiratory panel negative for COVID-19, influenza and RSV.   Medicines ordered and prescription drug management:  I ordered medication including Zofran, fluids for nausea,  dehydration Reevaluation of the patient after these medicines showed that the patient improved I have reviewed the patients home medicines and have made adjustments as needed   Problem List / ED Course:  Patient presents the emergency department concerns of nausea and vomiting.  Reports symptoms began this morning.  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  Also endorses associated diarrhea.  No hematemesis, hematochezia, melanotic stools.  No prior history of similar symptoms.  She does endorse that she  started on semaglutide several weeks ago and has had the symptoms present intermittently primarily with nausea for On exam, patient has no focal abdominal tenderness.  Normal bowel sounds.  Will obtain labs for assessment of hydration status.  I suspect this may be gastroenteritis but cannot rule out a possible medication side effect to the semaglutide. Lab work is reassuring.  Elevations in white count, hemoglobin, and platelets are seen which may be due to hemoconcentration but polycythemia also considered.  CMP unremarkable with no significant sign of dehydration.  UA without ketones or increased specific gravity is also just possible dehydration and starvation. At this time, I do suspect patient's symptoms are likely secondary to a medication side effect.  Given the lack of focal abdominal pain, doubtful of gastroenteritis.  And managed with ODT Zofran for home use for the next several days.  Encouraged hydration to reduce the risk of complications return to the emergency department.  Advised patient to follow-up with PCP and discuss possible discontinuation of the semaglutide if her symptoms persist after administration of these injections.  Patient verbalized understanding treatment plan and agreeable fall plans for outpatient follow-up.  Patient discharged home in stable condition  Final Clinical Impression(s) / ED Diagnoses Final diagnoses:   Nausea and vomiting, unspecified vomiting type    Rx / DC Orders ED Discharge Orders          Ordered    ondansetron (ZOFRAN-ODT) 4 MG disintegrating tablet  Every 8 hours PRN        10/18/23 1717              Smitty Knudsen, PA-C 10/18/23 1723    Pricilla Loveless, MD 10/20/23 [phone]

## 2023-10-18 NOTE — ED Triage Notes (Signed)
 C/o n/v/d starting today. 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

## 2023-10-19 ENCOUNTER — Other Ambulatory Visit: Payer: Self-pay

## 2023-10-19 NOTE — Telephone Encounter (Signed)
 [facility] for [facility] on [facility]. Informed him nurse received request and they are handling it.

## 2023-10-24 ENCOUNTER — Ambulatory Visit: Payer: Self-pay

## 2023-10-24 ENCOUNTER — Other Ambulatory Visit: Payer: Self-pay

## 2023-10-24 DIAGNOSIS — I422 Other hypertrophic cardiomyopathy: Secondary | ICD-10-CM

## 2023-10-24 DIAGNOSIS — Z8673 Personal history of transient ischemic attack (TIA), and cerebral infarction without residual deficits: Secondary | ICD-10-CM

## 2023-10-24 DIAGNOSIS — I5032 Chronic diastolic (congestive) heart failure: Secondary | ICD-10-CM

## 2023-10-24 DIAGNOSIS — Z789 Other specified health status: Secondary | ICD-10-CM

## 2023-10-24 NOTE — Telephone Encounter (Signed)
 Messaged patient to see if she was able to get either medication after this because I believe she was approved for the [facility].

## 2023-10-24 NOTE — Patient Instructions (Signed)
 Visit Information  Thank you for taking time to visit with me today. Please don't hesitate to contact me if I can be of assistance to you.   Following are the goals we discussed today:   Goals Addressed             This Visit's Progress    Manage Heart Failure       Care Coordination Interventions: Basic overview and discussion of pathophysiology of Heart Failure reviewed Provided education on low sodium diet Provided education about placing scale on hard, flat surface Advised patient to weigh each morning after emptying bladder Discussed importance of daily weight and advised patient to weigh and record daily Reviewed role of diuretics in prevention of fluid overload and management of heart failure; Discussed the importance of keeping all appointments with provider   Spoke with patient. She reports she continues to do well.  She is taking Camzyos and her 7.  She reports her weight 228 lbs.  Discussed her diet and warned of foods with salt and also limiting restaurant food due to increased salt content.  She verbalized understanding.  No RN CM concerns.           Our next appointment is by telephone on 11/22/23 at 100 pm   Please call the care guide team at [phone] if you need to cancel or reschedule your appointment.   If you are experiencing a Mental Health or Behavioral Health Crisis or need someone to talk to, please call the Suicide and Crisis Lifeline: 988   Patient verbalizes understanding of instructions and care plan provided today and agrees to view in MyChart. Active MyChart status and patient understanding of how to access instructions and care plan via MyChart confirmed with patient.     The patient has been provided with contact information for the care management team and has been advised to call with any health related questions or concerns.   Bary Leriche RN, MSN [facility], [facility] Health RN Care Manager Direct  Dial: [phone]  Fax: [phone] Website: [email or web address]

## 2023-10-24 NOTE — Patient Outreach (Signed)
 Care Coordination   Follow Up Visit Note   10/24/2023 Name: Lydia Thompson MRN: [id number] DOB: [birth year]  Lydia Thompson is a 48 y.o. year old female who sees Lydia Olszewski, MD for primary care. I spoke with  Fransisca Kaufmann by phone today.  What matters to the patients health and wellness today?  Managing heart failure    Goals Addressed             This Visit's Progress    Manage Heart Failure       Care Coordination Interventions: Basic overview and discussion of pathophysiology of Heart Failure reviewed Provided education on low sodium diet Provided education about placing scale on hard, flat surface Advised patient to weigh each morning after emptying bladder Discussed importance of daily weight and advised patient to weigh and record daily Reviewed role of diuretics in prevention of fluid overload and management of heart failure; Discussed the importance of keeping all appointments with provider   Spoke with patient. She reports she continues to do well.  She is taking Camzyos and her 29.  She reports her weight 228 lbs.  Discussed her diet and warned of foods with salt and also limiting restaurant food due to increased salt content.  She verbalized understanding.  No RN CM concerns.           SDOH assessments and interventions completed:  Yes     Care Coordination Interventions:  Yes, provided   Follow up plan: Follow up call scheduled for April    Encounter Outcome:  Patient Visit Completed   Bary Leriche RN, MSN [facility], [facility] Health RN Care Manager Direct Dial: [phone]  Fax: [phone] Website: [email or web address]

## 2023-10-25 ENCOUNTER — Telehealth: Payer: Self-pay

## 2023-10-25 NOTE — Progress Notes (Unsigned)
 Care Guide Pharmacy Note  10/25/2023 Name: [name] MRN: [id number] DOB: [birth year]  Referred By: Lula Olszewski, MD Reason for referral: Care Coordination (Outreach to schedule with pharm d )   Lydia Thompson is a 48 y.o. year old female who is a primary care patient of Lula Olszewski, MD.  Fransisca Kaufmann was referred to the pharmacist for assistance related to: HTN  An unsuccessful telephone outreach was attempted today to contact the patient who was referred to the pharmacy team for assistance with medication assistance. Additional attempts will be made to contact the patient.  Penne Lash , RMA     [facility]  [facility], [facility] Guide  Direct Dial: [phone]  Website: [email or web address]

## 2023-10-26 NOTE — Progress Notes (Signed)
 Care Guide Pharmacy Note  10/26/2023 Name: [name] MRN: [id number] DOB: [birth year]  Referred By: Lula Olszewski, MD Reason for referral: Care Coordination (Outreach to schedule with pharm d )   Lydia Thompson is a 48 y.o. year old female who is a primary care patient of Lula Olszewski, MD.  Fransisca Kaufmann was referred to the pharmacist for assistance related to: DMII  Successful contact was made with the patient to discuss pharmacy services including being ready for the pharmacist to call at least 5 minutes before the scheduled appointment time and to have medication bottles and any blood pressure readings ready for review. The patient agreed to meet with the pharmacist via telephone visit on (date/time).10/31/2023  Penne Lash , RMA     [place]  [facility], [facility] Guide  Direct Dial: [phone]  Website: [email or web address]

## 2023-10-27 ENCOUNTER — Other Ambulatory Visit: Payer: Self-pay

## 2023-10-30 ENCOUNTER — Other Ambulatory Visit: Payer: Self-pay

## 2023-10-31 ENCOUNTER — Encounter: Payer: Self-pay | Admitting: Pharmacist

## 2023-10-31 ENCOUNTER — Telehealth: Payer: Self-pay | Admitting: Pharmacist

## 2023-10-31 ENCOUNTER — Other Ambulatory Visit: Payer: Self-pay

## 2023-10-31 ENCOUNTER — Other Ambulatory Visit: Payer: Self-pay | Admitting: Pharmacist

## 2023-10-31 NOTE — Progress Notes (Signed)
 Attempt was made to contact patient by phone today for follow up by Clinical Pharmacist regarding medication access assistance.  Unable to reach patient. LM on VM with my contact number [phone] or [phone]

## 2023-10-31 NOTE — Progress Notes (Addendum)
 10/31/2023 Name: Lydia Thompson MRN: [id number] DOB: 1975/12/16  Chief Complaint  Patient presents with   Medication Management    Lydia Thompson is a 48 y.o. year old female who presented for a telephone visit.   They were referred to the pharmacist by their PCP for assistance in managing medication access and complex medication management.    Subjective:  Care Team: Primary Care Provider: Lula Olszewski, MD ; Next Scheduled Visit: 12/11/2023 Cardiologist: Dr Carmie Kanner and Dr Jacinto Halim; Next Scheduled Visit: 01/19/2024 (ECHO scheduled for 11/06/2023) Neurologist: Dr Frances Furbish (for OSA) ; Next Scheduled Visit: no follow up currently scheduled Registered Dietician - Last visti 10/17/2023  Medication Access/Adherence  Current Pharmacy:  [facility] - [facility] [address], [address] [place] [address] Phone: [phone] Fax: [phone]  [facility] - [place], [place] - [address] [place] [address] Phone: [phone] Fax: [phone]  [facility] [place], [place] - [address] [place] [address] Phone: [phone] Fax: [phone]  [facility] [place], [place] - [address] [place] [address] Phone: [phone] Fax: [phone]   Patient reports affordability concerns with their medications: Yes - prior authorization for both Wegovy and Allyne Gee have been denied.  Patient reports access/transportation concerns to their pharmacy: No  Patient reports adherence concerns with their medications:  No      Obesity/Complicated by ASCVD / history of stroke, GERD, obstructive sleep apnea:  Current medications: none. She has been prescribed both Wegovy and Rybelsus but both were denied by her insurance.   Weight Management treatments previously prescribed: diet only. She is not candidate  for many other treatments for obesity due to cardiovascular history  Recommended to use [employer] weight loss program   Wt = 230 lbs Ht = 5\' 3"  BMI = 40.74 kg/m2   Current diet:  Drinks mostly water Juices some Tries to limit sodium intake as much as possible Stress related to her and her husbands health sometimes leads to not making best food / meal choices.  Diet: boiled egg, yogurt for breakfast Sandwiches like grilled cheese but has decreased in the last 2 weeks. ' Has been trying to increase non starchy vegetable but still has some potatoes, pinto beans.  Meats - chicken, [place]  Current physical activity: little  Current medication access support: none. Patient has [facility] - she has met $600 deductible.    Perimenopausal / Menopausal symptoms:  She has been prescribed Veozah but has not started because is not covered by her insurance and prior authorization was denied.  She also has been prescribed estradiol vaginal cream to use as needed for dryness but she has been instructed to use sparingly due to history of stroke and heart disease.   Reviewed prior authorization criteria for Veozah:   Patient has to try SNRI, SSRI or gabapentin for 8 weeks and fail treatment or have contraindication to treatment Patient has been taking SNRI - duloxetine for the last 6 months for anxiety. She continues to have hot flashes but duloxetine has helped with anxiety.  Patient tried gabapentin - started February 2025 but she had to stop due to worsening of edema / weight gain - patient has CHF / hypertrophic cardiomyopathy.    Objective:  Lab Results  Component Value Date   HGBA1C 5.5 12/06/2022    Lab  Results  Component Value Date   CREATININE 0.80 10/18/2023   BUN 18 10/18/2023   NA 137 10/18/2023   K 4.7 10/18/2023   CL 102 10/18/2023   CO2 28 10/18/2023    Lab Results  Component Value Date   CHOL 182 12/06/2022   HDL 34.80 (L) 12/06/2022   LDLCALC 128 (H)  12/06/2022   TRIG 95.0 12/06/2022   CHOLHDL 5 12/06/2022    Medications Reviewed Today     Reviewed by Henrene Pastor, RPH-CPP (Pharmacist) on 10/31/23 at 1030  Med List Status: <None>   Medication Order Taking? Sig Documenting Provider Last Dose Status Informant  aspirin EC 81 MG tablet [id number] Yes Take 1 tablet (81 mg total) by mouth daily. Swallow whole. Elder Negus, MD Taking Active Self, Pharmacy Records  DULoxetine (CYMBALTA) 60 MG capsule [id number] Yes Take 1 capsule (60 mg total) by mouth daily. Replaces 30 mg dose Lula Olszewski, MD Taking Active   estradiol (ESTRACE) 0.1 MG/GM vaginal cream [id number]  Limit use to minimum amount to control symptoms.  Patient taking differently: Place 1 Applicatorful vaginally as needed (dryness).   Lula Olszewski, MD  Active Self, Pharmacy Records           Med Note [facility]   Tue Oct 31, 2023 10:30 AM) Uses rarely   Fezolinetant [facility]) 45 MG TABS [id number] No Take 1 tablet (45 mg total) by mouth daily at 6 (six) AM.  Patient not taking: Reported on 10/31/2023   Lula Olszewski, MD Not Taking Active   furosemide (LASIX) 20 MG tablet [id number] Yes Take 1 tablet (20 mg total) by mouth 2 (two) times daily. Patwardhan, Anabel Bene, MD Taking Active   LORazepam (ATIVAN) 0.5 MG tablet [id number] Yes Take 1 tablet (0.5 mg total) by mouth 2 (two) times daily as needed for anxiety. Lula Olszewski, MD Taking Active   mavacamten (CAMZYOS) 5 MG CAPS capsule [id number] Yes Take 1 capsule (5 mg total) by mouth daily. Christell Constant, MD Taking Active   metoprolol succinate (TOPROL-XL) 100 MG 24 hr tablet [id number] Yes Take 1.5 tablets (150 mg total) by mouth daily. Take with or immediately following a meal. Patwardhan, Anabel Bene, MD Taking Active   ondansetron (ZOFRAN-ODT) 4 MG disintegrating tablet [id number] Yes Take 1 tablet (4 mg total) by mouth every 8 (eight) hours as needed for nausea or vomiting. Smitty Knudsen, PA-C Taking  Active   spironolactone (ALDACTONE) 25 MG tablet [id number] Yes Take 1 tablet (25 mg total) by mouth daily. Patwardhan, Anabel Bene, MD Taking Active               Assessment/Plan:     Obesity/Complicated by ASCVD / history of stroke, GERD, obstructive sleep apnea: - Currently unable to achieve goal weight loss of 5-10% through diet and lifestyle modifications alone - Continue with dietary counseling with registered dietician - Provided motivational interviewing. Discussed setting non-weight based goals -  Patient would benefit greatly from weight loss in relation to secondary prevention of ASCVD or worsening of her CVD. She meets all criteria per her health plan's denial though I am not sure it was clear that she is under the care of a cardiologist. Will request letter from cardiologist regarding their opinion of potential patient benefit from weight loss and [facility]  / semaglutide therapy.   (Criteria per [facility] for [facility] - medication is prescribed in consultation with cardiologist or endocrinologist; documentation of established cardiovascular disease such as prior  stroke; BMI > 27 and A1C < 6.5% and dose not have type 2 DM) If I get a letter from cardiologist, then will restart prior authorization process for [facility].    Perimenopausal / Menopausal symptoms:  - Restarted prior authorization for Veozah. Patient should meet criteria for prior authorization given that she has not been able to take gabapentin due to edema / weight gain and she has been taking SNRI - duloxetine for > 8 weeks and continues to have hot flashes and menopausal symptoms.  Cover My Meds code - (Key: [id number])  Follow Up Plan: 1 to 2 weeks.   Henrene Pastor, PharmD Clinical Pharmacist [facility] Primary Care  Population Health [phone]

## 2023-10-31 NOTE — Progress Notes (Signed)
 Patient called back. PHone visit completed - see notes.

## 2023-11-02 ENCOUNTER — Other Ambulatory Visit: Payer: Self-pay

## 2023-11-06 ENCOUNTER — Telehealth: Payer: Self-pay | Admitting: Internal Medicine

## 2023-11-06 ENCOUNTER — Ambulatory Visit (HOSPITAL_COMMUNITY): Payer: 59 | Attending: Internal Medicine

## 2023-11-06 ENCOUNTER — Other Ambulatory Visit (HOSPITAL_COMMUNITY): Payer: Self-pay

## 2023-11-06 ENCOUNTER — Telehealth: Payer: Self-pay

## 2023-11-06 DIAGNOSIS — I421 Obstructive hypertrophic cardiomyopathy: Secondary | ICD-10-CM

## 2023-11-06 LAB — ECHOCARDIOGRAM LIMITED
Area-P 1/2: 2.95 cm2
S' Lateral: 3 cm

## 2023-11-06 NOTE — Telephone Encounter (Signed)
 Pharmacy Patient Advocate Encounter  Received notification from  [facility]  that Prior Authorization for [facility] has been APPROVED from 11/01/23 to 05/03/24. Ran test claim, Copay is $210.39. This test claim was processed through [facility]- copay amounts may vary at other pharmacies due to pharmacy/plan contracts, or as the patient moves through the different stages of their insurance plan.   PA #/Case ID/Reference #: [id number]

## 2023-11-06 NOTE — Telephone Encounter (Signed)
 Let voicemail to patient - LVOT gradient has resolved (2.5 mm Hg - LVEF 65% - Will attempt to call later this week about symptom burden.  In regard to her ASCVD risk and symptoms: She meet all criteria; Reginal Lutes would be appropriate and beneficial for Mrs. Lawler given her CVD, history of stroke, and her NYHA II-III oHCM.   This will not interact with Evette Georges, MD FASE [facility] Cardiologist [facility]  [address] [place], [address] [place], [place] [address] [phone]  4:43 PM

## 2023-11-07 ENCOUNTER — Encounter: Payer: Self-pay | Admitting: Internal Medicine

## 2023-11-07 NOTE — Telephone Encounter (Signed)
 Saw you had tried to reach this patient

## 2023-11-08 ENCOUNTER — Other Ambulatory Visit (HOSPITAL_COMMUNITY): Payer: Self-pay

## 2023-11-08 ENCOUNTER — Telehealth: Payer: Self-pay | Admitting: Pharmacy Technician

## 2023-11-08 MED ORDER — MAVACAMTEN 2.5 MG PO CAPS: 2.5000 mg | ORAL_CAPSULE | Freq: Every day | ORAL | 0 refills | Status: DC

## 2023-11-08 NOTE — Telephone Encounter (Signed)
 Called pt advised of MD recommendations.  Advised that MD would like to speak with her but I need certain information in order to update [employer] [facility] portal.  She reports she has not started any new medications no HF symptoms to report.  Expresses feels better since starting Camzyos.   [employer] portal updated 2.5 mg dose ordered and sent to specialty pharmacy on file.

## 2023-11-08 NOTE — Addendum Note (Signed)
 Addended by: Macie Burows on: 11/08/2023 02:21 PM   Modules accepted: Orders

## 2023-11-08 NOTE — Telephone Encounter (Signed)
 Pharmacy Patient Advocate Encounter   Received notification from Physician's Office that prior authorization for wegovy is required/requested.   Insurance verification completed.   The patient is insured through [facility] ADVANTAGE/RX ADVANCE .   Per test claim: PA required; PA submitted to above mentioned insurance via CoverMyMeds Key/confirmation #/EOC  [id number] Status is pending

## 2023-11-08 NOTE — Telephone Encounter (Signed)
 Script for mavacamten 2.5 mg PO every day would not process via EPIC.  [facility] gave verbal order for mavacamten 2.5 mg PO every day.

## 2023-11-08 NOTE — Addendum Note (Signed)
 Addended by: Macie Burows on: 11/08/2023 02:59 PM   Modules accepted: Orders

## 2023-11-08 NOTE — Telephone Encounter (Signed)
 NYHA I - no chest pain.  No fatigue.  She is NYHA I with no issues on mavacamten.  Obstructive physiology   Decrease to 2.5 mg as per protocol (done)  Need f/u with me six months from index visit.  [name], MD FASE [facility] Cardiologist [facility]  [address] [place], [address] [place], [place] [address] [phone]  2:48 PM

## 2023-11-09 ENCOUNTER — Other Ambulatory Visit: Payer: Self-pay | Admitting: Pharmacist

## 2023-11-09 NOTE — Telephone Encounter (Signed)
 Sent my chart message notifying patient of this approval.

## 2023-11-09 NOTE — Telephone Encounter (Signed)
 Pharmacy Patient Advocate Encounter  Received notification from [facility] ADVANTAGE/RX ADVANCE that Prior Authorization for Reginal Lutes has been CANCELLED due to   PA #/Case ID/Reference #: [id number]

## 2023-11-09 NOTE — Progress Notes (Signed)
 11/14/2023 Name: Lydia Thompson MRN: [id number] DOB: September 01, 1975  Chief Complaint  Patient presents with   Medication Management    Lydia Thompson is a 48 y.o. year old female . They were referred to the pharmacist by their PCP for assistance in managing medication access and complex medication management.    Subjective:  Care Team: Primary Care Provider: Lula Olszewski, MD ; Next Scheduled Visit: 12/11/2023 Cardiologist: Dr Carmie Kanner and Dr Jacinto Halim; Next Scheduled Visit: 01/19/2024 Neurologist: Dr Frances Furbish (for OSA) ; Next Scheduled Visit: no follow up currently scheduled Registered Dietician - Last visit 10/17/2023  Medication Access/Adherence  Current Pharmacy:  [facility] - [facility] [address] [place] [address] Phone: [phone] Fax: [phone]  [facility] - [place], [place] - [address] [place] [address] Phone: [phone] Fax: [phone]  [facility] [place], [place] - [address] [place] [address] Phone: [phone] Fax: [phone]  [facility] [place], [place] - [address] [place] [address] Phone: [phone] Fax: [phone]   Patient reports affordability concerns with their medications: Yes - prior authorization for both Wegovy and Allyne Gee were initially denied but we have resent appeals.   Patient reports access/transportation concerns to their pharmacy: No  Patient reports adherence concerns with their medications:  No      Obesity/Complicated by ASCVD / history of stroke, GERD, obstructive sleep apnea:  Current medications: none. She has been prescribed both Wegovy and Rybelsus but both were denied by her insurance.  Prior authorization was resent with notation that her cardiologist, Dr Izora Ribas,  part of treatment team and is in  agreement with treatment with [facility] for decrease risk for future cardiovascular events since she has already had a stroke.   Chandrasekhar, Mahesh A, MD3 days ago   [facility] Let voicemail to patient - LVOT gradient has resolved (2.5 mm Hg - LVEF 65% - Will attempt to call later this week about symptom burden.   In regard to her ASCVD risk and symptoms: She meet all criteria; Reginal Lutes would be appropriate and beneficial for Lydia Thompson given her CVD, history of stroke, and her NYHA II-III oHCM.   This will not interact with Evette Georges, MD FASE [facility] Cardiologist [facility]  [address] [place], [address] [place], [place] [address] [phone]  4:43 PM      Criteria per [facility] - medication is prescribed in consultation with cardiologist or endocrinologist; documentation of established cardiovascular disease such as prior stroke; BMI > 27 and A1C < 6.5% and dose not have type 2 DM.  However it looks like prior authorization was denied for the following reason: [employer] it requesting external appeal since prior appeal had been requested and denied.        Weight Management treatments previously prescribed: diet only.  She is not a candidate for many other treatments for obesity due to cardiovascular history.  Wt = 230 lbs Ht = 5\' 3"  BMI = 40.74 kg/m2  Current diet:  Drinks mostly water Juices some Tries to limit sodium intake as much as possible Stress related to her and her husbands health sometimes leads to not making best food / meal choices.  Diet: boiled egg, yogurt for breakfast Sandwiches like grilled cheese but has decreased in the last  2 weeks. ' Has been trying to increase non starchy vegetable but still has some potatoes, pinto beans.  Meats - chicken, [place]  Current physical activity: little  Current medication access support: none. Patient has [facility] - she has met $600 deductible.    Perimenopausal /  Menopausal symptoms:  She has been prescribed Veozah but has not started because is not covered by her insurance.  Prior authorization was resubmitted and approved. 11/01/2023 but per notes cost was $210.39.  Patient cannot afford this cost.  She also has been prescribed estradiol vaginal cream to use as needed for dryness but she has been instructed to use sparingly due to history of stroke and heart disease.    Objective:  Lab Results  Component Value Date   HGBA1C 5.5 12/06/2022    Lab Results  Component Value Date   CREATININE 0.80 10/18/2023   BUN 18 10/18/2023   NA 137 10/18/2023   K 4.7 10/18/2023   CL 102 10/18/2023   CO2 28 10/18/2023    Lab Results  Component Value Date   CHOL 182 12/06/2022   HDL 34.80 (L) 12/06/2022   LDLCALC 128 (H) 12/06/2022   TRIG 95.0 12/06/2022   CHOLHDL 5 12/06/2022    Medications Reviewed Today     Reviewed by Henrene Pastor, RPH-CPP (Pharmacist) on 11/09/23 at 1036  Med List Status: <None>   Medication Order Taking? Sig Documenting Provider Last Dose Status Informant  aspirin EC 81 MG tablet [id number] Yes Take 1 tablet (81 mg total) by mouth daily. Swallow whole. Elder Negus, MD Taking Active Self, Pharmacy Records  DULoxetine (CYMBALTA) 60 MG capsule [id number] Yes Take 1 capsule (60 mg total) by mouth daily. Replaces 30 mg dose Lula Olszewski, MD Taking Active   estradiol (ESTRACE) 0.1 MG/GM vaginal cream [id number]  Limit use to minimum amount to control symptoms.  Patient taking differently: Place 1 Applicatorful vaginally as needed (dryness).   Lula Olszewski, MD  Active Self, Pharmacy Records           Med Note [facility]   Tue Oct 31, 2023 10:30 AM) Uses rarely   Fezolinetant [facility]) 45 MG TABS [id number] No Take 1 tablet (45 mg total) by mouth daily at 6 (six) AM.  Patient not taking: Reported on 11/09/2023   Lula Olszewski, MD Not Taking Active   furosemide (LASIX) 20 MG tablet [id number] Yes Take 1 tablet  (20 mg total) by mouth 2 (two) times daily. Patwardhan, Anabel Bene, MD Taking Active   LORazepam (ATIVAN) 0.5 MG tablet [id number] Yes Take 1 tablet (0.5 mg total) by mouth 2 (two) times daily as needed for anxiety. Lula Olszewski, MD Taking Active   mavacamten (CAMZYOS) 2.5 MG CAPS capsule [id number] Yes Take 1 capsule (2.5 mg total) by mouth daily. Christell Constant, MD Taking Active   metoprolol succinate (TOPROL-XL) 100 MG 24 hr tablet [id number] Yes Take 1.5 tablets (150 mg total) by mouth daily. Take with or immediately following a meal. Patwardhan, Anabel Bene, MD Taking Active   ondansetron (ZOFRAN-ODT) 4 MG disintegrating tablet [id number]  Take 1 tablet (4 mg total) by mouth every 8 (eight) hours as needed for nausea or vomiting. Smitty Knudsen, PA-C  Active   spironolactone (ALDACTONE) 25 MG tablet [id number] Yes Take 1 tablet (25 mg total) by mouth daily. Elder Negus, MD Taking Active               Assessment/Plan:  Obesity/Complicated by ASCVD / history of stroke, GERD, obstructive sleep apnea: - Currently unable to achieve goal weight loss of 5-10% through diet and lifestyle modifications alone - Continue with dietary counseling with registered dietician -  Patient would benefit greatly from weight loss in relation to secondary prevention of ASCVD or worsening of her CVD. She meets all criteria per her health plan's denial though I am not sure it was clear that she is under the care of a cardiologist at the time of her initial request for prior authorization. [employer] has requested that an external appeal be completed - will work on this but might require provider to do a peer to peer review.   Perimenopausal / Menopausal symptoms:  - Provided her pharmacy with the [facility] discount card information above. Cost was $0 with card. Patient notified and will start Veozah 75mg  daily.    Follow Up Plan: 1 to 2 weeks.   Henrene Pastor, PharmD Clinical Pharmacist [facility]  Primary Care  Population Health [phone]

## 2023-11-13 ENCOUNTER — Other Ambulatory Visit: Payer: Self-pay

## 2023-11-13 NOTE — Addendum Note (Signed)
 Addended by: Macie Burows on: 11/13/2023 01:44 PM   Modules accepted: Orders

## 2023-11-14 ENCOUNTER — Encounter: Payer: Self-pay | Admitting: Pharmacist

## 2023-11-14 NOTE — Telephone Encounter (Signed)
 Will try again in April- around April 7th

## 2023-11-14 NOTE — Telephone Encounter (Signed)
 I feel like the 120 day may be soon exhausted. Could you find out when we would be able to re-submit new PA? I think that might be faster than waiting the 30 days for an external review.

## 2023-11-15 ENCOUNTER — Other Ambulatory Visit

## 2023-11-15 DIAGNOSIS — Z006 Encounter for examination for normal comparison and control in clinical research program: Secondary | ICD-10-CM

## 2023-11-16 NOTE — Progress Notes (Signed)
 11/16/2023 Name: Lydia Thompson MRN: [id number] DOB: 18-Feb-1976  Chief Complaint  Patient presents with   Medication Management    Wegovy    Lydia Thompson is a 48 y.o. year old female . They were referred to the pharmacist by their PCP for assistance in managing medication access and CVD risk reduction .    Subjective:  Care Team: Primary Care Provider: Lula Olszewski, MD ; Next Scheduled Visit: 12/11/2023 Cardiologist: Dr Carmie Kanner and Dr Jacinto Halim; Next Scheduled Visit: 01/19/2024 Neurologist: Dr Frances Furbish (for OSA) ; Next Scheduled Visit: no follow up currently scheduled Registered Dietician - Last visit 10/17/2023  Medication Access/Adherence  Current Pharmacy:  [facility] - [facility] [address] [place] [address] Phone: [phone] Fax: [phone]  [facility] - [place], [place] - [address] [place] [address] Phone: [phone] Fax: [phone]  [facility] [place], [place] - [address] [place] [address] Phone: [phone] Fax: [phone]  [facility] - [place], [place] - [address] [place] [address] Phone: [phone] Fax: [phone]   Patient reports affordability concerns with their medications: Yes - prior authorization for Wegovy denied and appeal was denied   Patient reports access/transportation concerns to their pharmacy: No  Patient reports adherence concerns with their medications:  No      Obesity/Complicated by ASCVD / history of stroke, GERD, obstructive sleep apnea:  Current medications: none. She has been prescribed both Wegovy and Rybelsus but both were denied by her insurance. Reginal Lutes was initially denied 08/29/2023. Appeal was denied 09/29/2023. Cardiology restart prior authorization on 11/08/2023 but it was denied due to previous  denials. Letter suggested we could submit a second level / external appeal.   Reviewed criteria per [facility] for [facility] provided in the initial denial letter.  1) medication is prescribed by or in consultation with cardiologist or endocrinologist 2) documentation of established cardiovascular disease such as prior stroke 3) BMI > 27 4) A1C < 6.5% 5) no history of type 1 or type 2 DM 6) medicaiton is used in addition to or are unabl to use ALL of the following:  - high intensity statin - blood thinning medication (like anti platelet therapy and / or anticoagulation)  - high blood pressure medications - ACE inhibitor or ARB therapy 7) will be used together with diet and exercise 8) will not be used together with other semaglutide containing products or other GLP1 9) patient has not under gone prior weigh tloss surgery 10) prescribed maintenance dose will be 2.4mg  once weekly  11) not pregnany or using effecctive birth control for female of child bearing potential   Patient has taken atorvastatin and ramipril in past around 2020 but was stopped with notation "completed course" -  Weight Management treatments previously prescribed: diet only.  She is not a candidate for many other treatments for obesity due to cardiovascular history.  Wt = 230 lbs Ht = 5\' 3"  BMI = 40.74 kg/m2  Current physical activity: little  Current medication access support: none. Patient has [facility] - she has met $600 deductible.    Objective:  BP Readings from Last 3 Encounters:  10/18/23 116/76  09/25/23 110/70  09/06/23 113/69     Lab Results  Component Value Date   HGBA1C 5.5 12/06/2022    Lab Results  Component  Value Date   CREATININE 0.80 10/18/2023   BUN 18 10/18/2023   NA 137 10/18/2023   K 4.7 10/18/2023   CL 102 10/18/2023   CO2 28 10/18/2023    Lab Results  Component Value Date   CHOL 182 12/06/2022   HDL 34.80 (L) 12/06/2022   LDLCALC 128 (H) 12/06/2022   TRIG  95.0 12/06/2022   CHOLHDL 5 12/06/2022    Current Outpatient Medications:    aspirin EC 81 MG tablet, Take 1 tablet (81 mg total) by mouth daily. Swallow whole., Disp: 90 tablet, Rfl: 3   DULoxetine (CYMBALTA) 60 MG capsule, Take 1 capsule (60 mg total) by mouth daily. Replaces 30 mg dose, Disp: 90 capsule, Rfl: 3   estradiol (ESTRACE) 0.1 MG/GM vaginal cream, Limit use to minimum amount to control symptoms. (Patient taking differently: Place 1 Applicatorful vaginally as needed (dryness).), Disp: 42.5 g, Rfl: 1   Fezolinetant (VEOZAH) 45 MG TABS, Take 1 tablet (45 mg total) by mouth daily at 6 (six) AM. (Patient not taking: Reported on 11/09/2023), Disp: 30 tablet, Rfl: 11   furosemide (LASIX) 20 MG tablet, Take 1 tablet (20 mg total) by mouth 2 (two) times daily., Disp: 180 tablet, Rfl: 3   LORazepam (ATIVAN) 0.5 MG tablet, Take 1 tablet (0.5 mg total) by mouth 2 (two) times daily as needed for anxiety., Disp: 30 tablet, Rfl: 1   mavacamten (CAMZYOS) 2.5 MG CAPS capsule, Take 1 capsule (2.5 mg total) by mouth daily., Disp: 35 capsule, Rfl: 0   metoprolol succinate (TOPROL-XL) 100 MG 24 hr tablet, Take 1.5 tablets (150 mg total) by mouth daily. Take with or immediately following a meal., Disp: 135 tablet, Rfl: 3   ondansetron (ZOFRAN-ODT) 4 MG disintegrating tablet, Take 1 tablet (4 mg total) by mouth every 8 (eight) hours as needed for nausea or vomiting., Disp: 20 tablet, Rfl: 0   spironolactone (ALDACTONE) 25 MG tablet, Take 1 tablet (25 mg total) by mouth daily., Disp: 90 tablet, Rfl: 3    Assessment/Plan:     Obesity/Complicated by ASCVD / history of stroke, GERD, obstructive sleep apnea: - Currently unable to achieve goal weight loss of 5-10% through diet and lifestyle modifications alone - Continue with dietary counseling with registered dietician -  Patient would benefit greatly from weight loss in relation to secondary prevention of ASCVD. She meets 9 of 10 of the criteria per her  health plan's denial letter.  Will consult with patient and her PCP. To meet last criteria patient would need to take a high intensity statin AND either an ARB / ACE inhibitor of have documentation that she is not able to take these medications.     Follow Up Plan: 1 to 2 weeks.   Henrene Pastor, PharmD Clinical Pharmacist [facility]  Population Health [phone]

## 2023-11-22 ENCOUNTER — Other Ambulatory Visit: Payer: Self-pay

## 2023-11-22 ENCOUNTER — Telehealth: Payer: Self-pay

## 2023-11-23 ENCOUNTER — Other Ambulatory Visit: Payer: Self-pay | Admitting: Internal Medicine

## 2023-11-23 NOTE — Telephone Encounter (Signed)
 Camzyos pt. Please advise

## 2023-11-23 NOTE — Patient Instructions (Signed)
 Visit Information  Thank you for taking time to visit with me today. Please don't hesitate to contact me if I can be of assistance to you before our next scheduled telephone appointment.  Our next appointment is by telephone on April 17 at 2pm  Following is a copy of your care plan:   Goals Addressed             This Visit's Progress    Manage Heart Failure       Care Coordination Interventions: Basic overview and discussion of pathophysiology of Heart Failure reviewed Provided education on low sodium diet Provided education about placing scale on hard, flat surface Advised patient to weigh each morning after emptying bladder Discussed importance of daily weight and advised patient to weigh and record daily Reviewed role of diuretics in prevention of fluid overload and management of heart failure; Discussed the importance of keeping all appointments with provider   Introduced self to patient as her new Teacher, adult education and easily established rapport. Patient reports she continues to do well both in her quest to lowering her BMI and managing her heart failure by monitoring her "water" weight gain by weighing herself every day.  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 She is still working with RPH Tammy Eckard to [employer] authorization to cover/lower cost of [place] which she cannot afford to obtain otherwise.  She reports her weight is 222 lbs today, 4/2 (6 pounds less than last CCM televisit).  Discussed her diet and warned of foods with salt and also limiting restaurant food due to increased salt content.  She verbalized understanding but admitted watching her salt intake is very challenging as she loves "salty foods".  Encouraged patient to use a salt substitute such as Mrs. Dash and opt for low sodium options when she  buys items for healthy snacks.        Patient verbalizes understanding of instructions and care plan provided today and agrees to view in MyChart. Active MyChart status and patient understanding of how to access instructions and care plan via MyChart confirmed with patient.     The patient has been provided with contact information for the care management team and has been advised to call with any health related questions or concerns.   Please call the care guide team at [phone] if you need to cancel or reschedule your appointment.   Please call 1-800-273-TALK (toll free, 24 hour hotline) if you are experiencing a Mental Health or Behavioral Health Crisis or need someone to talk to.  Paulette Lynch A. Mliss Fritz RN, BA, [facility], CRRN [facility] Health RN Care Manager, Transition of Care [phone]

## 2023-11-23 NOTE — Patient Outreach (Signed)
 11/22/2023  Completed Complex Care Management with 48 y.o. Lydia Thompson. Belle who suffers from Heart Failure/Cardiomyopathy. Introduced self as patient's new RN CM. Rapport quickly established with this patient who is highly motivated to improve her health.  Cami Delawder A. Mliss Fritz RN, BA, [facility], CRRN [facility] Health RN Care Manager, Transition of Care [phone]

## 2023-11-24 MED ORDER — SEMAGLUTIDE-WEIGHT MANAGEMENT 1.7 MG/0.75ML ~~LOC~~ SOAJ
1.7000 mg | SUBCUTANEOUS | 0 refills | Status: DC
Start: 2024-02-19 — End: 2024-01-03
  Filled 2023-11-24: qty 3, 28d supply, fill #0

## 2023-11-24 MED ORDER — SEMAGLUTIDE-WEIGHT MANAGEMENT 0.25 MG/0.5ML ~~LOC~~ SOAJ
0.2500 mg | SUBCUTANEOUS | 0 refills | Status: DC
  Filled 2023-11-24 – 2024-01-16 (×2): qty 2, 28d supply, fill #0

## 2023-11-24 MED ORDER — LOSARTAN POTASSIUM 25 MG PO TABS
12.5000 mg | ORAL_TABLET | Freq: Every day | ORAL | 3 refills | Status: DC
  Filled 2023-11-24: qty 45, 90d supply, fill #0
  Filled 2024-01-11 – 2024-01-19 (×6): qty 45, 90d supply, fill #1
  Filled 2024-01-24: qty 15, 30d supply, fill #1
  Filled 2024-02-18: qty 15, 30d supply, fill #2
  Filled 2024-03-18: qty 15, 30d supply, fill #3
  Filled 2024-03-24: qty 45, 90d supply, fill #3
  Filled 2024-06-16: qty 45, 90d supply, fill #4

## 2023-11-24 MED ORDER — SEMAGLUTIDE-WEIGHT MANAGEMENT 1 MG/0.5ML ~~LOC~~ SOAJ
1.0000 mg | SUBCUTANEOUS | 0 refills | Status: DC
  Filled 2023-11-24: qty 2, 28d supply, fill #0

## 2023-11-24 MED ORDER — ATORVASTATIN CALCIUM 40 MG PO TABS
40.0000 mg | ORAL_TABLET | Freq: Every day | ORAL | 3 refills | Status: DC
  Filled 2023-11-24: qty 90, 90d supply, fill #0
  Filled 2024-01-11 – 2024-02-18 (×2): qty 90, 90d supply, fill #1
  Filled 2024-05-20: qty 90, 90d supply, fill #2
  Filled 2024-07-10: qty 90, 90d supply, fill #3

## 2023-11-24 MED ORDER — SEMAGLUTIDE-WEIGHT MANAGEMENT 2.4 MG/0.75ML ~~LOC~~ SOAJ
2.4000 mg | SUBCUTANEOUS | 0 refills | Status: DC
  Filled 2023-11-24: qty 3, 28d supply, fill #0

## 2023-11-24 MED ORDER — SEMAGLUTIDE-WEIGHT MANAGEMENT 0.5 MG/0.5ML ~~LOC~~ SOAJ
0.5000 mg | SUBCUTANEOUS | 0 refills | Status: DC
  Filled 2023-11-24 – 2023-12-25 (×2): qty 2, 28d supply, fill #0

## 2023-11-24 NOTE — Telephone Encounter (Signed)
 MyChart secure digital messaging clinical encounter  Chief Complaint:  Patient inquiry regarding insurance coverage for [facility] for cardiovascular risk reduction.  Relevant History: - Past Medical History: History of transient ischemic attack (TIA), congestive heart failure (CHF) - Current Medications: Not currently on high-intensity statin or ARB/ACE inhibitor - Recent Communications: Pharmacy contacted regarding Wegovy coverage criteria on 11/16/2023 - Blood Pressure Readings: Generally well-controlled, most recent 116/76 (10/18/23) - Insurance Requirements: Documentation indicates patient meets all criteria for [facility] coverage except concurrent therapy with high-intensity statin and ARB/ACE inhibitor  Assessment: 1. History of cerebrovascular disease (I63.9) - patient with prior TIA requiring secondary prevention 2. Heart failure (I50.9) - stable, would benefit from ARB therapy 3. Hyperlipidemia (E78.5) - requires high-intensity statin for secondary prevention 4. Obesity with cardiovascular risk factors (E66.01) - candidate for GLP-1 agonist therapy [facility])  Plan: 1. Initiate losartan 25mg  daily for cardioprotection and to meet insurance criteria for [facility] coverage. Patient has stable blood pressure readings and should tolerate this well. 2. Initiate atorvastatin 40mg  daily for secondary prevention of cardiovascular events and to meet insurance criteria for [facility] coverage. 3. Patient education provided regarding:    - Rationale for adding these medications    - Importance for cardiovascular risk reduction    - Common side effects to monitor    - Emergency warning signs 4. Will resubmit [facility] prior authorization after patient has been on both medications for 2 weeks. 5. Follow-up planned via MyChart in 2 weeks to assess medication tolerance.  Current Medical Guidelines Reference: This management plan aligns with the American Heart Association/American College of Cardiology  guidelines for secondary prevention in patients with established ASCVD, which recommend high-intensity statin therapy and consideration of RAAS inhibitors (ACE inhibitors or ARBs) for patients with history of cerebrovascular disease.  Medical Decision Making: Moderate complexity due to management of multiple chronic conditions (TIA, CHF, obesity) requiring careful coordination of medication therapy to optimize cardiovascular risk reduction while meeting insurance requirements for [facility] coverage. Consideration of potential medication interactions and patient-specific factors including blood pressure trends was necessary.  Please see the MyChart message reply(ies) for my assessment and plan.   This patient gave consent for this Medical Advice Message and is aware that it may result in a bill to [employer], as well as the possibility of receiving a bill for a co-payment or deductible. They are an established patient, but are not seeking medical advice exclusively about a problem treated during an in person or video visit in the last seven days. I did not recommend an in person or video visit within seven days of my reply.    I spent a total of 15 minutes cumulative time within 7 days through [employer].  Activities performed during this time include: - Reviewing prior medical records and blood pressure trends (4 minutes) - Evaluating insurance criteria documentation for [facility] coverage (3 minutes) - Clinical decision making regarding appropriate medication therapy (4 minutes) - Crafting a contextually relevant, evidence-based, and understandable MyChart message (4 minutes)  Lula Olszewski, MD  Problem List Updates: - Add: Hyperlipidemia (E78.5) - initiating atorvastatin 40mg  daily - Update: Heart failure (I50.9) - initiating losartan 25mg  daily

## 2023-11-24 NOTE — Addendum Note (Signed)
 Addended by: Lula Olszewski on: 11/24/2023 06:07 PM   Modules accepted: Orders

## 2023-11-25 ENCOUNTER — Other Ambulatory Visit: Payer: Self-pay

## 2023-11-25 ENCOUNTER — Other Ambulatory Visit (HOSPITAL_BASED_OUTPATIENT_CLINIC_OR_DEPARTMENT_OTHER): Payer: Self-pay

## 2023-11-27 ENCOUNTER — Other Ambulatory Visit (HOSPITAL_COMMUNITY): Payer: Self-pay

## 2023-11-27 ENCOUNTER — Other Ambulatory Visit: Payer: Self-pay

## 2023-11-27 ENCOUNTER — Telehealth: Payer: Self-pay | Admitting: Pharmacy Technician

## 2023-11-27 NOTE — Telephone Encounter (Signed)
 Pharmacy Patient Advocate Encounter   Received notification from Pt Calls Messages that prior authorization for [facility] is required/requested.   Insurance verification completed.   The patient is insured through [facility] ADVANTAGE/RX ADVANCE .   Per test claim: PA required; PA submitted to above mentioned insurance via CoverMyMeds Key/confirmation #/[id number] Status is pending

## 2023-11-28 ENCOUNTER — Other Ambulatory Visit: Payer: Self-pay

## 2023-11-28 LAB — GENECONNECT MOLECULAR SCREEN: Genetic Analysis Overall Interpretation: NEGATIVE

## 2023-11-28 NOTE — Telephone Encounter (Signed)
 Pharmacy Patient Advocate Encounter  Received notification from [facility] ADVANTAGE/RX ADVANCE that Prior Authorization for [facility] has been DENIED.  Patient had a different coverage in December and new insurance now so plan will require an external appeal    PA #/Case ID/Reference #: [id number]

## 2023-11-29 ENCOUNTER — Other Ambulatory Visit (HOSPITAL_COMMUNITY): Payer: Self-pay

## 2023-12-06 ENCOUNTER — Telehealth: Payer: Self-pay | Admitting: Internal Medicine

## 2023-12-06 NOTE — Telephone Encounter (Signed)
  Pt c/o medication issue:  1. Name of Medication: mavacamten (CAMZYOS) 2.5 MG CAPS capsule   2. How are you currently taking this medication (dosage and times per day)?   Take 1 capsule (2.5 mg total) by mouth daily.    3. Are you having a reaction (difficulty breathing--STAT)? No   4. What is your medication issue? [facility], requesting to get new prescription for this medication and would like to get an updated echo on their portal as well

## 2023-12-07 NOTE — Telephone Encounter (Signed)
 Called pt and no answer, unable to leave vm. Pt chart showing future prescriptions for increased dose sent to pt pharmacy already

## 2023-12-07 NOTE — Telephone Encounter (Signed)
 Please see pt response to prev message

## 2023-12-07 NOTE — Telephone Encounter (Signed)
 Patient has appointment for echo scheduled on 12/12/23.

## 2023-12-08 ENCOUNTER — Encounter: Payer: 59 | Admitting: Internal Medicine

## 2023-12-11 ENCOUNTER — Ambulatory Visit (INDEPENDENT_AMBULATORY_CARE_PROVIDER_SITE_OTHER): Payer: 59 | Admitting: Internal Medicine

## 2023-12-11 ENCOUNTER — Encounter: Payer: Self-pay | Admitting: Internal Medicine

## 2023-12-11 ENCOUNTER — Other Ambulatory Visit: Payer: Self-pay

## 2023-12-11 VITALS — BP 110/60 | Temp 98.0°F | Ht 63.0 in | Wt 223.8 lb

## 2023-12-11 DIAGNOSIS — I5032 Chronic diastolic (congestive) heart failure: Secondary | ICD-10-CM | POA: Diagnosis not present

## 2023-12-11 DIAGNOSIS — I10 Essential (primary) hypertension: Secondary | ICD-10-CM

## 2023-12-11 DIAGNOSIS — Z0000 Encounter for general adult medical examination without abnormal findings: Secondary | ICD-10-CM

## 2023-12-11 DIAGNOSIS — Z1211 Encounter for screening for malignant neoplasm of colon: Secondary | ICD-10-CM

## 2023-12-11 DIAGNOSIS — Z0001 Encounter for general adult medical examination with abnormal findings: Secondary | ICD-10-CM

## 2023-12-11 DIAGNOSIS — Z23 Encounter for immunization: Secondary | ICD-10-CM

## 2023-12-11 DIAGNOSIS — Z8673 Personal history of transient ischemic attack (TIA), and cerebral infarction without residual deficits: Secondary | ICD-10-CM | POA: Diagnosis not present

## 2023-12-11 DIAGNOSIS — Z6839 Body mass index (BMI) 39.0-39.9, adult: Secondary | ICD-10-CM

## 2023-12-11 LAB — CBC WITH DIFFERENTIAL/PLATELET
Basophils Absolute: 0 10*3/uL (ref 0.0–0.1)
Basophils Relative: 0.4 % (ref 0.0–3.0)
Eosinophils Absolute: 0.3 10*3/uL (ref 0.0–0.7)
Eosinophils Relative: 2.5 % (ref 0.0–5.0)
HCT: 40.9 % (ref 36.0–46.0)
Hemoglobin: 13.4 g/dL (ref 12.0–15.0)
Lymphocytes Relative: 22.2 % (ref 12.0–46.0)
Lymphs Abs: 2.8 10*3/uL (ref 0.7–4.0)
MCHC: 32.8 g/dL (ref 30.0–36.0)
MCV: 82.6 fl (ref 78.0–100.0)
Monocytes Absolute: 0.7 10*3/uL (ref 0.1–1.0)
Monocytes Relative: 5.6 % (ref 3.0–12.0)
Neutro Abs: 8.8 10*3/uL — ABNORMAL HIGH (ref 1.4–7.7)
Neutrophils Relative %: 69.3 % (ref 43.0–77.0)
Platelets: 399 10*3/uL (ref 150.0–400.0)
RBC: 4.96 Mil/uL (ref 3.87–5.11)
RDW: 14.8 % (ref 11.5–15.5)
WBC: 12.7 10*3/uL — ABNORMAL HIGH (ref 4.0–10.5)

## 2023-12-11 LAB — LIPID PANEL
Cholesterol: 105 mg/dL (ref 0–200)
HDL: 33.7 mg/dL — ABNORMAL LOW (ref >39.00)
LDL Cholesterol: 58 mg/dL (ref 0–99)
NonHDL: 71.58
Total CHOL/HDL Ratio: 3
Triglycerides: 70 mg/dL (ref 0.0–149.0)
VLDL: 14 mg/dL (ref 0.0–40.0)

## 2023-12-11 LAB — COMPREHENSIVE METABOLIC PANEL WITH GFR
ALT: 17 U/L (ref 0–35)
AST: 14 U/L (ref 0–37)
Albumin: 4.1 g/dL (ref 3.5–5.2)
Alkaline Phosphatase: 61 U/L (ref 39–117)
BUN: 13 mg/dL (ref 6–23)
CO2: 29 meq/L (ref 19–32)
Calcium: 9.2 mg/dL (ref 8.4–10.5)
Chloride: 101 meq/L (ref 96–112)
Creatinine, Ser: 0.77 mg/dL (ref 0.40–1.20)
GFR: 92.46 mL/min (ref >60.00)
Glucose, Bld: 89 mg/dL (ref 70–99)
Potassium: 3.5 meq/L (ref 3.5–5.1)
Sodium: 138 meq/L (ref 135–145)
Total Bilirubin: 0.5 mg/dL (ref 0.2–1.2)
Total Protein: 7.7 g/dL (ref 6.0–8.3)

## 2023-12-11 LAB — HEMOGLOBIN A1C: Hgb A1c MFr Bld: 5.6 % (ref 4.6–6.5)

## 2023-12-11 MED ORDER — OZEMPIC (0.25 OR 0.5 MG/DOSE) 2 MG/3ML ~~LOC~~ SOPN
0.5000 mg | PEN_INJECTOR | SUBCUTANEOUS | 12 refills | Status: DC
Start: 2023-12-11 — End: 2024-01-03
  Filled 2023-12-11 – 2023-12-21 (×2): qty 3, 28d supply, fill #0
  Filled 2023-12-21: qty 3, fill #0

## 2023-12-11 NOTE — Assessment & Plan Note (Signed)
 Preventive measures are necessary to reduce the risk of future cerebrovascular events. She is currently on aspirin  and cholesterol medication. Continue these medications. add Ozempic  which is FDA approved for stroke prevention but insurance previously denied

## 2023-12-11 NOTE — Telephone Encounter (Signed)
 Pt in office today PCP will discuss with pt.

## 2023-12-11 NOTE — Patient Instructions (Addendum)
 Please try these tips to maintain a healthy lifestyle:  Avoid processed foods like bologna, salami, spam, candy bars  Try to eat non-starchy and fiber-rich vegetables, 30%-plus lean proteins, and only healthy fats (nuts, extra virgin olive oil, fatty fish) or lean meats.  Try to completely eliminate sugar containing beverages. This includes juice, non diet soda, and sweet tea.   Drink at least 1 glass of water with each meal and aim for at least 8 glasses per day  Eat a diet rich and fruits and vegetables and healthy fats (plant and fish fats) to reduce risk of heart attack and stroke.  In particular, avocado, extra virgin olive oil, nuts, and fatty fish are known to be great for your blood vessels.  Exercise at least 150 minutes every week.  Try to do all 3 types of exercise: 1)  Stretching (yoga-type), 2) cardio (220-46 y.o.) = 174  is max heart rate) and 3) resistance training exercises for muscle building.  Try to avoid exercise with a lot of impact (running on concrete is hard on joints) and extreme exercise (marathons) which have been shown to do more harm than good.   Keep challenging yourself to make positive and sustainable lifestyle changes. Love and be good to your future self!  You can't hate yourself into positive changes.  If you know you have an unhealthy coping strategy (e.g. substance or food misuse) or if depression/anxiety is holding you back, please let me know;  I am trained in addiction treatment and passionate about helping people to make positive life changes!  Get good sleep (8 hours on a consistent schedule.  Sleep is important for mood management, weight management, and cognitive performance. Snoring and sleep apnea are connected to alzheimer's dementia, depression, obesity, and irritability  For your mental status:  practice gratitude exercises daily, focus on self-growth and creative interests, and use mindfulness to stay out of negative emotional states.    A    Building Your Long-Term Health Plan  During today's preventive visit, we covered a variety of important health checks to help you stay on top of your well-being.  We also discussed strategies to maintain your health and identified some areas that might benefit from further exploration.   Preventive care visits like today's are designed to be proactive, but sometimes additional attention may be needed.  Rest assured, we're here for you.  If these areas require further evaluation or management, we'd be happy to schedule a separate, focused appointment to address them in detail.  Addressing Next Steps  [x]   Follow-up Visit: To ensure we address any unresolved issues and continue monitoring your overall health, we recommend scheduling a follow-up appointment in 1 year for your next preventive care visit. If you experience any new problems, need to discuss any medical concerns, or your condition worsens before then, please don't hesitate to call our office to schedule an appointment or seek emergency care as needed.  [x]   Preventive Measures: Maintaining healthy habits plays a crucial role in overall wellness. We recommend considering these tips: [x]   Regular appointments with dental and vision professionals [x]   Nightly nasal saline mist to keep sinuses clear [x]   Consistent toothbrushing to maintain oral health [x]   Using an app like SnoreLab to track sleep quality [x]   Routine checks of blood pressure and heart rate [x]   Medical Information: In some instances, we may require additional medical information from other providers to create a comprehensive picture of your health. If applicable, we can  provide a medical information release form at the front desk for you to sign, allowing us  to gather these records. [x]   Lab Tests: If any lab tests were ordered today, scheduling them within a week of your visit helps ensure the best possible insurance coverage.  Planning Follow Up to Work on a  Problem? Make the Most of Our Focused (20 minute) Appointments  [x]   Clearly state your top concerns at the beginning of the visit to focus our discussion [x]   If you anticipate you will need more time, please inform the front desk during scheduling - we can book multiple appointments in the same week. [x]   If you have transportation problems- use our convenient video appointments or ask about transportation support. [x]   We can get down to business faster if you use MyChart to update information before the visit and submit non-urgent questions before your visit. Thank you for taking the time to provide details through MyChart.  Let our nurse know and she can import this information into your encounter documents.  Arrival and Wait Times  [x]   Arriving on time ensures that everyone receives prompt attention. [x]   Early morning (8a) and afternoon (1p) appointments tend to have shortest wait times. [x]   Unfortunately, we cannot delay appointments for late arrivals or hold slots during phone calls.  Bring to Your Next Appointment:  [x]   Medications: Please bring all your medication bottles to your next appointment to ensure we have an accurate record of your prescriptions. [x]   Health Diaries: If you're monitoring any health conditions at home, keeping a diary of your readings can be very helpful for discussions at your next appointment.  Reviewing Your Records  [x]   Review your attached preventive care information at the end of these patient instructions. [x]   Review this early draft of your clinical encounter notes below and the final encounter summary tomorrow on MyChart after its been completed.      Getting Answers and Following Up  [x]   Simple Questions & Concerns: For quick questions or basic follow-up after your visit, reach us  at [phone] or MyChart messaging. [x]   Complex Concerns: If your concern is more complex, scheduling an appointment might be best. Discuss this with the  staff to find the most suitable option. [x]   Lab & Imaging Results: We'll contact you directly if results are abnormal or you don't use MyChart. Most normal results will be on MyChart within 2-3 business days, with a review message from Dr. Boston Byers. Haven't heard back in 2 weeks? Need results sooner? Contact us  at [phone]. [x]   Referrals: Our referral coordinator will manage specialist referrals. The specialist's office should contact you within 2 weeks to schedule an appointment. Call us  if you haven't heard from them after 2 weeks.  Staying Connected  [x]   MyChart: Activate your MyChart for the fastest way to access results and message us . See the last page of this paperwork for instructions on how to activate.  Billing  [x]   X-ray & Lab Orders: These are billed by separate companies. Contact the invoicing company directly for questions or concerns. [x]   Visit Charges: Discuss any billing inquiries with our administrative services team.  Your Satisfaction Matters  [x]   Share Your Experience: We strive for your satisfaction! If you have any complaints, or preferably compliments, please let Dr. Boston Byers know directly or contact our Practice Administrators, Olinda Bertrand or [employer], by asking at the front desk.  Next Steps  [x]   Schedule Follow-Up:  We recommend a follow-up appointment in 1 year for your next wellness visit.  If you develop any new problems, want to address any medical issues, or your condition worsens before then, please call us  for an appointment or seek emergency care. [x]   Preventive Care:  Make sure to keep regular appointments with dental and vision professionals, use nightly nasal saline mist sprays to keep your sinuses clear and toothbrushing to protect your teeth. Use SnoreLab App or other app to track your sleep quality. Check blood pressure and heart rate routinely. [x]   Medical Information Release:  For any relevant medical  information we don't have, please sign a release form at the front desk so we can obtain it for your records. [x]   Lab Tests:  Schedule any lab tests from today for within a week to ensure best insurance coverage.    Making the Most of Our Focused (20 minute) Appointments:  [x]   Clearly state your top concerns at the beginning of the visit to focus our discussion [x]   If you anticipate you will need more time, please inform the front desk during scheduling - we can book multiple appointments in the same week. [x]   If you have transportation problems- use our convenient video appointments or ask about transportation support. [x]   We can get down to business faster if you use MyChart to update information before the visit and submit non-urgent questions before your visit. Thank you for taking the time to provide details through MyChart.  Let our nurse know and she can import this information into your encounter documents.  Arrival and Wait Times: [x]   Arriving on time ensures that everyone receives prompt attention. [x]   Early morning (8a) and afternoon (1p) appointments tend to have shortest wait times. [x]   Unfortunately, we cannot delay appointments for late arrivals or hold slots during phone calls.  Bring to Your Next Appointment  [x]   Medications: Please bring all your medication bottles to your next appointment to ensure we have an accurate record of your prescriptions. [x]   Health Diaries: If you're monitoring any health conditions at home, keeping a diary of your readings can be very helpful for discussions at your next appointment.  Reviewing Your Records  [x]   Review your attached preventive care information at the end of these patient instructions. [x]   Review this early draft of your clinical encounter notes below and the final encounter summary tomorrow on MyChart after its been completed.   Encounter for annual general medical examination with abnormal findings in adult -      TSH Rfx on Abnormal to Free T4 -     Comprehensive metabolic panel with GFR -     CBC with Differential/Platelet; Future -     Hemoglobin A1c -     Ozempic  (0.25 or 0.5 MG/DOSE); Inject 0.5 mg into the skin once a week.  Dispense: 3 mL; Refill: 12  Essential hypertension Assessment & Plan: Blood pressure is well-controlled with antihypertensive medication.  Orders: -     Hemoglobin A1c  H/O TIA (transient ischemic attack) and stroke Assessment & Plan: Preventive measures are necessary to reduce the risk of future cerebrovascular events. She is currently on aspirin  and cholesterol medication. Continue these medications. add Ozempic  which is FDA approved for stroke prevention but insurance previously denied  Orders: -     Lipid panel; Future -     Pneumococcal conjugate vaccine 20-valent  Screening for malignant neoplasm of colon -  Cologuard  Chronic heart failure with preserved ejection fraction (HCC) -     Pneumococcal conjugate vaccine 20-valent  Need for vaccination against Streptococcus pneumoniae -     Pneumococcal conjugate vaccine 20-valent  Morbid obesity (HCC) -     TSH Rfx on Abnormal to Free T4 -     Pneumococcal conjugate vaccine 20-valent     Getting Answers and Following Up  [x]   Simple Questions & Concerns: For quick questions or basic follow-up after your visit, reach us  at [phone] or MyChart messaging. [x]   Complex Concerns: If your concern is more complex, scheduling an appointment might be best. Discuss this with the staff to find the most suitable option. [x]   Lab & Imaging Results: We'll contact you directly if results are abnormal or you don't use MyChart. Most normal results will be on MyChart within 2-3 business days, with a review message from Dr. Boston Byers. Haven't heard back in 2 weeks? Need results sooner? Contact us  at [phone]. [x]   Referrals: Our referral coordinator will manage specialist referrals. The specialist's office  should contact you within 2 weeks to schedule an appointment. Call us  if you haven't heard from them after 2 weeks.  Staying Connected  [x]   MyChart: Activate your MyChart for the fastest way to access results and message us . See the last page of this paperwork for instructions on how to activate.  Billing  [x]   X-ray & Lab Orders: These are billed by separate companies. Contact the invoicing company directly for questions or concerns. [x]   Visit Charges: Discuss any billing inquiries with our administrative services team.  Your Satisfaction Matters  [x]   Share Your Experience: We strive for your satisfaction! If you have any complaints, or preferably compliments, please let Dr. Boston Byers know directly or contact our Practice Administrators, Olinda Bertrand or [employer], by asking at the front desk.

## 2023-12-11 NOTE — Progress Notes (Signed)
 Phone [phone]  -- Comprehensive Physical Exam (CPE) Annual Office Visit  --  Patient:  Lydia Thompson      Age: 48 y.o.       Sex:  female  Date:   12/11/2023 Patient Care Team: Anthon Kins, MD as PCP - General (Internal Medicine) Cody Das, MD as PCP - Cardiology (Cardiology) Cody Das, MD as Consulting Physician (Cardiology) Shawn Delay, MD (Gynecology) Merriam Abbey, DO as Consulting Physician (Neurology) Knox Perl, MD as Consulting Physician (Cardiology) Ronney Cola as Social Worker Cecilie Coffee, RPH-CPP (Pharmacist) Today's Healthcare Provider: Anthon Kins, MD  ------------------------------------------------------------------------------------------------------------------------------------- Chief Complaint  Patient presents with   Annual Exam    Pt is present for Annual Exam Fasting for labs.    Purpose of Visit: Comprehensive preventive health assessment and personalized health maintenance planning.  This encounter was conducted as a Comprehensive Physical Exam (CPE) preventive care annual visit. The patient's medical history and problem list were reviewed to inform individualized preventive care recommendations. No problem-specific medical treatment was provided during this visit.   Assessment & Plan Encounter for annual general medical examination with abnormal findings in adult General Health Maintenance   She is due for preventive screenings and vaccinations. Administer the pneumonia vaccine. and schedule a mammogram. Recommend daily vitamin D supplementation due to dietary insufficiency.  Follow-up   Advise follow-up on preventive care and screenings. Monitor thyroid function with blood work. Follow up with a gynecologist for cervical and breast cancer screening. Essential hypertension Blood pressure is well-controlled with antihypertensive medication. H/O TIA (transient ischemic attack) and stroke Preventive  measures are necessary to reduce the risk of future cerebrovascular events. She is currently on aspirin  and cholesterol medication. Continue these medications. add Ozempic  which is FDA approved for stroke prevention but insurance previously denied Screening for malignant neoplasm of colon Order a colon cancer screening kit (Cologuard)  Chronic heart failure with preserved ejection fraction (HCC)  Need for vaccination against Streptococcus pneumoniae She is at risk for pneumonia, which can be life-threatening in heart failure. Administer the pneumonia vaccine.  She has been on semaglutide  for two months, resulting in weight loss and reduced waist size. The current dose is 0.25 mg/0.5 mL weekly, with a potential increase to 0.5 mg/0.5 mL weekly if financially feasible. She engages in physical activity, achieving 6,000 steps over the weekend. Resistance training is recommended to prevent muscle loss and enhance fat burning. Continue semaglutide  at 0.25 mg/0.5 mL weekly and consider increasing the dose if financially feasible. Encourage resistance training.  Diagnoses and all orders for this visit: Encounter for annual general medical examination with abnormal findings in adult -     TSH Rfx on Abnormal to Free T4 -     Comprehensive metabolic panel with GFR -     CBC with Differential/Platelet; Future -     HgB A1c -     Semaglutide ,0.25 or 0.5MG /DOS, (OZEMPIC , 0.25 OR 0.5 MG/DOSE,) 2 MG/3ML SOPN; Inject 0.5 mg into the skin once a week. Essential hypertension -     HgB A1c H/O TIA (transient ischemic attack) and stroke -     Lipid panel; Future -     Pneumococcal conjugate vaccine 20-valent (Prevnar 20) Screening for malignant neoplasm of colon -     Cologuard Chronic heart failure with preserved ejection fraction (HCC) -     Pneumococcal conjugate vaccine 20-valent (Prevnar 20) Need for vaccination against Streptococcus pneumoniae -     Pneumococcal conjugate vaccine 20-valent (  Prevnar  20) Morbid obesity (HCC) -     TSH Rfx on Abnormal to Free T4 -     Pneumococcal conjugate vaccine 20-valent (Prevnar 20)    Encounter orders: ED Discharge Orders          Ordered    TSH Rfx on Abnormal to Free T4        12/11/23 0913    Comprehensive metabolic panel with GFR        12/11/23 0913    CBC with Differential/Platelet        12/11/23 0913    HgB A1c        12/11/23 0913    Semaglutide ,0.25 or 0.5MG /DOS, (OZEMPIC , 0.25 OR 0.5 MG/DOSE,) 2 MG/3ML SOPN  Weekly       Note to Pharmacy: Transient ishemic attack (TIA), morbid obesity, already on 0.25,  insurance wouldn't cover until she on statin and aspirin - she now takes, resubmit prior authorization if needed   12/11/23 0913    Lipid panel        12/11/23 0913    Pneumococcal conjugate vaccine 20-valent (Prevnar 20)        12/11/23 0913    Cologuard        12/11/23 0913           Today's preventive care visit included comprehensive health maintenance evaluation and gap closure alongside extensive anticipatory guidance, as follows: #  Eye exams: recommended every 1-2 years.   She voiced understanding and intent to adhere to that plan. #  Dental health: recommended regular tooth brushing, flossing, and dental visits every 6 months.  She voiced understanding and intent to adhere to that plan. #  Sinus health: nightly SimplySaline or similar recommended now.  She voiced understanding and intent to adhere to that plan. #  Diet/Exercise:   recommended regular exercise (150 min) and diet rich and fruits and vegetables and fiber and healthy fats to reduce risk of heart attack and stroke. She voiced understanding and intent to adhere to this plan. #  Sexuality: recommended STD prevention via partner selection & condoms.  Offered contraception / STD checks / genital wart treatment if appropriate. #  Cervical/Breast/Uterine/Ovarian cancer screening: strongly recommended follow up gynecology for comprehensive screenings. #  Thyroid  cancer screening:  no nodules todya #  Colon cancer screening:  denies family history of colon cancer or any GI bleeding,  advised 29-67 year old we should discuss and plan for   #  Skin cancer screening: recommended regular sunscreen use. she denies worrisome, changing, or new skin lesions.  #  Osteoporosis prevention:  recommended to maintain a good source of calcium  and vitamin D in diet.  She denies any personal history of early osteoporosis or fragility fractures #  Substance use: recommended absolute abstinence from all vaped or smoked recreational or illicit substances of abuse such as tobacco, nicotine, alcohol, illicit drugs, even sugar.  #  Safety:  recommended avoiding high risk activities, wear seat belts and not texting & driving #  Health maintenance and immunizations reviewed and she was encouraged to complete any incomplete and release any missing immunization records for us  Immunization History  Administered Date(s) Administered   PNEUMOCOCCAL CONJUGATE-20 12/11/2023   Tdap 10/18/2017   #  We attempted to update vaccination records and provide any missing vaccines that are recommended. Health Maintenance Due  Topic Date Due   Colonoscopy  Never done     # Incomplete health maintenance issues listed above were brought up for  discussion and she was encouraged to complete with our assistance. # Recommended follow up:  continued annual preventive health maintenance exams  Subjective   She has Essential hypertension; Hypertrophic cardiomyopathy (HCC); H/O TIA (transient ischemic attack) and stroke; Chronic heart failure with preserved ejection fraction (HCC); Morbid obesity (HCC); Post-menopause atrophic vaginitis; Dizziness; Pulmonary artery hypertension (HCC); Hot flashes, menopausal; Low libido; Nausea; Diastolic dysfunction; Anxiety disorder; Acute respiratory failure with hypoxia (HCC); SVT (supraventricular tachycardia) (HCC); Hypokalemia; Hemoptysis; Hot flashes; Cysts of  both ovaries; and Gastroesophageal reflux disease without esophagitis on their problem list. HPI History of Present Illness She is a 48 year old female who presents for an annual physical exam.  She has been using semaglutide  2.5 mg for the last two months, noting it is working 'a little'. She currently weighs 440 lbs and administers 40 mg weekly. She has experienced weight loss, noting a reduction in her stomach size, and is actively working on increasing her physical activity, reporting 6,000 steps over the weekend. 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  Her past medical history includes anxiety disorder, heart failure, ovarian cysts, dizziness, hypertension, heartburn, and a transient ischemic attack (TIA). She is on cholesterol medication and aspirin  for stroke prevention. She has a history of coughing up blood, which has not recurred recently, and has experienced hot flashes and low potassium levels. She reports a history of hypertrophic cardiomyopathy and is aware of the need to monitor her potassium levels.  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 She has a scar in her ear from this incident.  She has not had a colonoscopy. She had her first mammogram last year, which was normal, and is considering scheduling another one. She is up to date on her vaccinations except for the pneumonia vaccine, which is recommended due to her heart failure.  She takes a daily vitamin D supplement and is mindful of sun exposure, using sunblock regularly. She has a history of skin tags on her neck, which she is not currently addressing.   Review of Systems  Constitutional:  Positive for weight loss. Negative for chills, diaphoresis, fever and malaise/fatigue.  HENT:  Negative for congestion, ear discharge, ear pain, hearing loss, nosebleeds, sinus pain, sore throat and tinnitus.   Eyes:  Negative  for blurred vision, double vision, photophobia, pain, discharge and redness.  Respiratory:  Negative for cough, hemoptysis, sputum production, shortness of breath, wheezing and stridor.   Cardiovascular:  Negative for chest pain, palpitations, orthopnea, claudication, leg swelling and PND.  Gastrointestinal:  Negative for abdominal pain, blood in stool, constipation, diarrhea, heartburn, melena, nausea and vomiting.  Genitourinary:  Negative for dysuria, flank pain, frequency, hematuria and urgency.  Musculoskeletal:  Negative for back pain, falls, joint pain, myalgias and neck pain.  Skin:  Negative for itching and rash.  Neurological:  Negative for dizziness, tingling, tremors, sensory change, speech change, focal weakness, seizures, loss of consciousness, weakness and headaches.  Endo/Heme/Allergies:  Negative for environmental allergies and polydipsia. Does not bruise/bleed easily.  Psychiatric/Behavioral:  Negative for depression, hallucinations, memory loss, substance abuse and suicidal ideas. The patient is not nervous/anxious and does not have insomnia.      PROBLEMS,PMH, PSH, FH, prior meds, allergies, and SH were each reviewed and updated:    12/11/2023    8:31 AM  Depression screen PHQ 2/9  Decreased Interest 0  Down, Depressed, Hopeless 0  PHQ -  2 Score 0  Altered sleeping 0  Tired, decreased energy 0  Change in appetite 0  Feeling bad or failure about yourself  0  Trouble concentrating 0  Moving slowly or fidgety/restless 0  Suicidal thoughts 0  PHQ-9 Score 0  Difficult doing work/chores Not difficult at all   Patient Active Problem List   Diagnosis Date Noted   Gastroesophageal reflux disease without esophagitis 09/25/2023   Hot flashes 09/06/2023   Cysts of both ovaries 09/06/2023   Hemoptysis 08/07/2023   SVT (supraventricular tachycardia) (HCC) 07/08/2023   Hypokalemia 07/08/2023   Acute respiratory failure with hypoxia (HCC) 07/07/2023   Anxiety disorder  10/06/2022   Dizziness 08/30/2022   Pulmonary artery hypertension (HCC) 08/30/2022   Hot flashes, menopausal 08/30/2022   Low libido 08/30/2022   Nausea 08/30/2022   Diastolic dysfunction 08/30/2022   Post-menopause atrophic vaginitis 06/22/2022   Morbid obesity (HCC) 10/08/2020   Chronic heart failure with preserved ejection fraction (HCC) 11/08/2019   H/O TIA (transient ischemic attack) and stroke 05/01/2019   Hypertrophic cardiomyopathy (HCC) 04/28/2019   Essential hypertension 09/26/2017   Past Medical History:  Diagnosis Date   Anxiety    Chest pain of uncertain etiology 09/26/2017   CHF (congestive heart failure) (HCC)    Hot flashes, menopausal 08/30/2022   Tried OTC medications without help   Hypertension    Low libido 08/30/2022    Past Surgical History:  Procedure Laterality Date   ABDOMINAL HYSTERECTOMY  08/23/2003   dermoid cyst   Family History  Problem Relation Age of Onset   Cancer Mother 43       bone cancer   Hypertension Mother    Other Sister        Pipolar   Heart disease Paternal Uncle    Arthritis Paternal Uncle    Asthma Paternal Uncle    Hypertension Paternal Uncle    Cancer Maternal Grandmother        Lung cancer   Sleep apnea Neg Hx    Allergies  Allergen Reactions   Gabapentin      Caused edema and weight gain    Social History   Tobacco Use   Smoking status: Never   Smokeless tobacco: Never  Vaping Use   Vaping status: Never Used  Substance Use Topics   Alcohol use: Yes    Comment: 1 glass of wine every 3-4 months   Drug use: No     I attest that I have reviewed and confirmed the patients current medications to meet the medication reconciliation requirement  Current Outpatient Medications on File Prior to Visit  Medication Sig   aspirin  EC 81 MG tablet Take 1 tablet (81 mg total) by mouth daily. Swallow whole.   atorvastatin  (LIPITOR) 40 MG tablet Take 1 tablet (40 mg total) by mouth daily.   DULoxetine  (CYMBALTA ) 60 MG  capsule Take 1 capsule (60 mg total) by mouth daily. Replaces 30 mg dose   estradiol  (ESTRACE ) 0.1 MG/GM vaginal cream Limit use to minimum amount to control symptoms. (Patient taking differently: Place 1 Applicatorful vaginally as needed (dryness).)   furosemide  (LASIX ) 20 MG tablet Take 1 tablet (20 mg total) by mouth 2 (two) times daily.   LORazepam  (ATIVAN ) 0.5 MG tablet Take 1 tablet (0.5 mg total) by mouth 2 (two) times daily as needed for anxiety.   losartan  (COZAAR ) 25 MG tablet Take 0.5 tablets (12.5 mg total) by mouth daily.   mavacamten  (CAMZYOS ) 2.5 MG CAPS capsule Take 1 capsule (2.5  mg total) by mouth daily.   metoprolol  succinate (TOPROL -XL) 100 MG 24 hr tablet Take 1.5 tablets (150 mg total) by mouth daily. Take with or immediately following a meal.   ondansetron  (ZOFRAN -ODT) 4 MG disintegrating tablet Take 1 tablet (4 mg total) by mouth every 8 (eight) hours as needed for nausea or vomiting.   spironolactone  (ALDACTONE ) 25 MG tablet Take 1 tablet (25 mg total) by mouth daily.   Fezolinetant  (VEOZAH ) 45 MG TABS Take 1 tablet (45 mg total) by mouth daily at 6 (six) AM. (Patient not taking: Reported on 10/31/2023)   Semaglutide -Weight Management 0.25 MG/0.5ML SOAJ Inject 0.25 mg into the skin once a week for 28 days. (Patient not taking: Reported on 12/11/2023)   [START ON 12/23/2023] Semaglutide -Weight Management 0.5 MG/0.5ML SOAJ Inject 0.5 mg into the skin once a week for 28 days. (Patient not taking: Reported on 12/11/2023)   [START ON 01/21/2024] Semaglutide -Weight Management 1 MG/0.5ML SOAJ Inject 1 mg into the skin once a week for 28 days. (Patient not taking: Reported on 12/11/2023)   [START ON 02/19/2024] Semaglutide -Weight Management 1.7 MG/0.75ML SOAJ Inject 1.7 mg into the skin once a week for 28 days. (Patient not taking: Reported on 12/11/2023)   [START ON 03/19/2024] Semaglutide -Weight Management 2.4 MG/0.75ML SOAJ Inject 2.4 mg into the skin once a week for 28 days. (Patient not  taking: Reported on 12/11/2023)   No current facility-administered medications on file prior to visit.  There are no discontinued medications.  Objective  BP 110/60   Temp 98 F (36.7 C) (Temporal)   Ht 5\' 3"  (1.6 m)   Wt 223 lb 12.8 oz (101.5 kg)   BMI 39.64 kg/m  Body mass index is 39.64 kg/m.  Wt Readings from Last 3 Encounters:  12/11/23 223 lb 12.8 oz (101.5 kg)  10/18/23 227 lb (103 kg)  10/17/23 230 lb (104.3 kg)    GENERAL:  NAD, AAO, not ill-appearing  HENT:  NCAT, normal nose, mucous membranes moist.  Tympanic membrane evaluated and normal appearing bilaterally, oropharynx evaluated and normal appearing Left tonsil swollen more than right. Hearing loss on the right side. Right ear minor scarring tympanic membrane. EYES:  sclera nonicteric, no injection CV:  RRR, no murmurs/rubs/gallops LUNG: CTAB, normal WOB, no audible wheezing or stridor ABD: soft, nondistended, no guarding, no palpable tumor GYN:  expressed a preference to do breast and gynecological exam at another office which  I supported and encouraged explaining the importance of routine gynecological screenings. SKIN: warm, dry, no lesions of concern  Skin tags present. NEURO: alert, no focal deficit obvious, articulate speech Heel to toe walk slightly wobbly. Balance intact.  Reflexes normal. PSYCH: normal mood, behavior, thought content    Notes:  This document was synthesized by artificial intelligence ([employer]) using HIPAA-compliant recording of the clinical interaction;   We discussed the use of AI scribe software for clinical note transcription with the patient, who gave verbal consent to proceed.    This encounter employed state-of-the-art, real-time, collaborative documentation. The patient was empowered to actively review and assist in updating their electronic medical record on a shared monitor, ensuring transparency and improving accuracy.    Prior to and at the beginning of Comprehensive Physical  Exam (CPE) preventive care annual visit appointment types  we clarify to patients "Our goal today is to focus on your preventive or annual Comprehensive Physical Exam (CPE) preventive care annual visit, which typically covers routine screenings and overall health maintenance. However, if you share any new or  concerning symptoms--such as dizziness, passing out, severe pain, or anything else that may point to a more serious issue--we are both legally and ethically required to evaluate it. We cannot simply overlook or ignore such concerns, even if you later decide you don't want to discuss them, because it could jeopardize your health.  If addressing a new concern takes us  beyond the scope of the preventive visit, we may need to bill separately for that portion of care. We understand financial considerations are important, and we're happy to discuss your options if something new comes up. However, we want to be clear that once you mention a potentially serious issue, we must investigate it; we can't ethically or legally exclude that from our records or our evaluation. Please let us  know all of your questions or worries. Together, we can decide how best to manage them and how to minimize any unexpected costs, but we want to keep you safe above all else."   This disclosure is mandated by professional ethics and legal obligations, as healthcare providers must address any substantial health concerns raised during any patient interaction and a comprehensive ROS is required by insurance companies for billing preventive-care visit type.    Signed: Anthon Kins, MD  [facility] [address] [place], [place] [address] Office:  [phone]    Health Maintenance, Female Adopting a healthy lifestyle and getting preventive care are important in promoting health and wellness. Ask your health care provider about: The right schedule for you to have regular tests and exams. Things you  can do on your own to prevent diseases and keep yourself healthy. What should I know about diet, weight, and exercise? Eat a healthy diet  Eat a diet that includes plenty of vegetables, fruits, low-fat dairy products, and lean protein. Do not eat a lot of foods that are high in solid fats, added sugars, or sodium. Maintain a healthy weight Body mass index (BMI) is used to identify weight problems. It estimates body fat based on height and weight. Your health care provider can help determine your BMI and help you achieve or maintain a healthy weight. Get regular exercise Get regular exercise. This is one of the most important things you can do for your health. Most adults should: Exercise for at least 150 minutes each week. The exercise should increase your heart rate and make you sweat (moderate-intensity exercise). Do strengthening exercises at least twice a week. This is in addition to the moderate-intensity exercise. Spend less time sitting. Even light physical activity can be beneficial. Watch cholesterol and blood lipids Have your blood tested for lipids and cholesterol at 47 years of age, then have this test every 5 years. Have your cholesterol levels checked more often if: Your lipid or cholesterol levels are high. You are older than 47 years of age. You are at high risk for heart disease. What should I know about cancer screening? Depending on your health history and family history, you may need to have cancer screening at various ages. This may include screening for: Breast cancer. Cervical cancer. Colorectal cancer. Skin cancer. Lung cancer. What should I know about heart disease, diabetes, and high blood pressure? Blood pressure and heart disease High blood pressure causes heart disease and increases the risk of stroke. This is more likely to develop in people who have high blood pressure readings or are overweight. Have your blood pressure checked: Every 3-5 years if you are  31-27 years of age. Every  year if you are 39 years old or older. Diabetes Have regular diabetes screenings. This checks your fasting blood sugar level. Have the screening done: Once every three years after age 73 if you are at a normal weight and have a low risk for diabetes. More often and at a younger age if you are overweight or have a high risk for diabetes. What should I know about preventing infection? Hepatitis B If you have a higher risk for hepatitis B, you should be screened for this virus. Talk with your health care provider to find out if you are at risk for hepatitis B infection. Hepatitis C Testing is recommended for: Everyone born from [birth year] through 1964. Anyone with known risk factors for hepatitis C. Sexually transmitted infections (STIs) Get screened for STIs, including gonorrhea and chlamydia, if: You are sexually active and are younger than 47 years of age. You are older than 47 years of age and your health care provider tells you that you are at risk for this type of infection. Your sexual activity has changed since you were last screened, and you are at increased risk for chlamydia or gonorrhea. Ask your health care provider if you are at risk. Ask your health care provider about whether you are at high risk for HIV. Your health care provider may recommend a prescription medicine to help prevent HIV infection. If you choose to take medicine to prevent HIV, you should first get tested for HIV. You should then be tested every 3 months for as long as you are taking the medicine. Pregnancy If you are about to stop having your period (premenopausal) and you may become pregnant, seek counseling before you get pregnant. Take 400 to 800 micrograms (mcg) of folic acid every day if you become pregnant. Ask for birth control (contraception) if you want to prevent pregnancy. Osteoporosis and menopause Osteoporosis is a disease in which the bones lose minerals and strength with aging.  This can result in bone fractures. If you are 88 years old or older, or if you are at risk for osteoporosis and fractures, ask your health care provider if you should: Be screened for bone loss. Take a calcium  or vitamin D supplement to lower your risk of fractures. Be given hormone replacement therapy (HRT) to treat symptoms of menopause. Follow these instructions at home: Alcohol use Do not drink alcohol if: Your health care provider tells you not to drink. You are pregnant, may be pregnant, or are planning to become pregnant. If you drink alcohol: Limit how much you have to: 0-1 drink a day. Know how much alcohol is in your drink. In the [place] one drink equals one 12 oz bottle of beer (355 mL), one 5 oz glass of wine (148 mL), or one 1 oz glass of hard liquor (44 mL). Lifestyle Do not use any products that contain nicotine or tobacco. These products include cigarettes, chewing tobacco, and vaping devices, such as e-cigarettes. If you need help quitting, ask your health care provider. Do not use street drugs. Do not share needles. Ask your health care provider for help if you need support or information about quitting drugs. General instructions Schedule regular health, dental, and eye exams. Stay current with your vaccines. Tell your health care provider if: You often feel depressed. You have ever been abused or do not feel safe at home. Summary Adopting a healthy lifestyle and getting preventive care are important in promoting health and wellness. Follow your health care provider's instructions about healthy  diet, exercising, and getting tested or screened for diseases. Follow your health care provider's instructions on monitoring your cholesterol and blood pressure. This information is not intended to replace advice given to you by your health care provider. Make sure you discuss any questions you have with your health care provider. Document Revised: 12/28/2020 Document Reviewed:  12/28/2020 Elsevier Patient Education  2024 [employer].

## 2023-12-11 NOTE — Assessment & Plan Note (Signed)
 Blood pressure is well-controlled with antihypertensive medication.

## 2023-12-12 ENCOUNTER — Telehealth: Payer: Self-pay

## 2023-12-12 ENCOUNTER — Ambulatory Visit (HOSPITAL_COMMUNITY): Attending: Cardiovascular Disease

## 2023-12-12 DIAGNOSIS — I421 Obstructive hypertrophic cardiomyopathy: Secondary | ICD-10-CM

## 2023-12-12 LAB — ECHOCARDIOGRAM LIMITED
Area-P 1/2: 3.4 cm2
MV M vel: 4.91 m/s
MV Peak grad: 96.2 mmHg
Radius: 0.5 cm
S' Lateral: 2.4 cm

## 2023-12-12 LAB — TSH RFX ON ABNORMAL TO FREE T4: TSH: 2.17 u[IU]/mL (ref 0.450–4.500)

## 2023-12-12 NOTE — Telephone Encounter (Signed)
 Pharmacy called in to f/u on Echo. Echo completed this morning. She states pt has two tablets left.

## 2023-12-12 NOTE — Telephone Encounter (Signed)
 Ozempic /Mounjaro  is approved exclusively as an adjunct to diet and exercise to improve glycemic control in adults with type 2 diabetes mellitus. A review of patient's medical chart reveals no documented diagnosis of type 2 diabetes or an A1C indicative of diabetes. Therefore, they do not currently meet the criteria for prior authorization of this medication. If clinically appropriate, alternative options such as Saxenda , Zepbound , or Wegovy  may be considered for this patient.  CMM KEY: [id number]

## 2023-12-13 ENCOUNTER — Other Ambulatory Visit (HOSPITAL_COMMUNITY): Payer: Self-pay

## 2023-12-13 ENCOUNTER — Other Ambulatory Visit: Payer: Self-pay

## 2023-12-13 ENCOUNTER — Encounter: Payer: Self-pay | Admitting: Internal Medicine

## 2023-12-13 DIAGNOSIS — I421 Obstructive hypertrophic cardiomyopathy: Secondary | ICD-10-CM

## 2023-12-13 MED ORDER — MAVACAMTEN 2.5 MG PO CAPS: 2.5000 mg | ORAL_CAPSULE | Freq: Every day | ORAL | 0 refills | Status: DC

## 2023-12-13 NOTE — Telephone Encounter (Signed)
 Brand-name Ozempic  is approved solely for the treatment of type 2 diabetes, whereas Wegovy  carries an FDA indication to reduce the risk of cardiovascular death, heart attack, and stroke in adults with established cardiovascular disease who are also overweight or obese. A prior authorization and a first-level appeal have already been submitted for Wegovy  and were both denied. Based on dispensing records, the patient has not filled prescriptions for either Wegovy  or Ozempic . Since the patient does not have documented diabetes, she would not qualify for a PA for Ozempic . If clinically appropriate, you could prescribe Wegovy  and another PA could be attempted; however, this was received from the insurance on 11/06/2023

## 2023-12-13 NOTE — Telephone Encounter (Signed)
 The patient has been notified of the result and verbalized understanding.  All questions (if any) were answered. Sima Du Delaney Perona, RN 12/13/2023 1:56 PM   Pt denies HF symptoms reports feels good.  PSF updated on Camzyos  REMS portal, refill of mavacamten  (Camzyos ) 2.5 mg PO every day sent to specialty pharmacy on file. Script will not go through electronically have called [facility] to give verbal order for medication.

## 2023-12-13 NOTE — Telephone Encounter (Signed)
-----   Message from Jann Melody sent at 12/13/2023 12:44 PM EDT ----- Results: Peak LVOT Gradient: 20 mm Hg (at worst, most are ~10 mm HG) LVEF : 65% Plan: Mavacamten  dose 2.5 If Patient has new CP, SOB, HF symptoms, or syncope, will need video visit (can add my admin or imaging days) If Patient has new medications or supplements, will need medication review for potential interactions 12 week echo is pending   Jann Melody, MD

## 2023-12-14 ENCOUNTER — Other Ambulatory Visit: Payer: Self-pay

## 2023-12-15 ENCOUNTER — Other Ambulatory Visit (HOSPITAL_COMMUNITY): Payer: Self-pay

## 2023-12-18 ENCOUNTER — Other Ambulatory Visit (HOSPITAL_COMMUNITY): Payer: Self-pay

## 2023-12-18 ENCOUNTER — Telehealth: Payer: Self-pay | Admitting: Pharmacist

## 2023-12-18 NOTE — Telephone Encounter (Signed)
 An external appeal review has been requested for Wegovy .  This can take up to 45 days.  Information was faxed to [phone] @2 :50 pm.  Thank you, Dene Fines, PharmD Clinical Pharmacist  [place]  Direct Dial: [phone]

## 2023-12-19 ENCOUNTER — Other Ambulatory Visit: Payer: Self-pay

## 2023-12-20 ENCOUNTER — Other Ambulatory Visit: Payer: Self-pay

## 2023-12-21 ENCOUNTER — Other Ambulatory Visit: Payer: Self-pay

## 2023-12-22 ENCOUNTER — Other Ambulatory Visit: Payer: Self-pay

## 2023-12-22 MED ORDER — MAVACAMTEN 2.5 MG PO CAPS: 2.5000 mg | ORAL_CAPSULE | Freq: Every day | ORAL | 1 refills | Status: DC

## 2023-12-25 ENCOUNTER — Other Ambulatory Visit: Payer: Self-pay

## 2023-12-26 ENCOUNTER — Other Ambulatory Visit (HOSPITAL_COMMUNITY): Payer: Self-pay

## 2023-12-27 ENCOUNTER — Other Ambulatory Visit (HOSPITAL_COMMUNITY): Payer: Self-pay

## 2023-12-27 ENCOUNTER — Telehealth: Payer: Self-pay | Admitting: Pharmacy Technician

## 2023-12-27 NOTE — Telephone Encounter (Signed)
 Received cmm for camzyos  5mg  but patient is on 2.5mg  per note on 12/22/23. I canceled the 5mg  request and submitted 2.5mg  pa   Pharmacy Patient Advocate Encounter   Received notification from CoverMyMeds that prior authorization for CAMZYOS  2.5MG  is required/requested.   Insurance verification completed.   The patient is insured through [facility] .   Per test claim: PA required; PA submitted to above mentioned insurance via CoverMyMeds Key/confirmation #/EOC [id number] Status is pending   Patient just got 12/22/23 #35/35ds

## 2023-12-27 NOTE — Telephone Encounter (Signed)
 fax completed form to [phone] as requested

## 2024-01-01 ENCOUNTER — Other Ambulatory Visit (HOSPITAL_COMMUNITY): Payer: Self-pay

## 2024-01-01 NOTE — Telephone Encounter (Signed)
 Appeal for Wegovy  has been approved through 06/19/2024.  Pharmacy notified.

## 2024-01-01 NOTE — Telephone Encounter (Signed)
 Pharmacy Patient Advocate Encounter  Received notification from [facility] health that Prior Authorization for camzyos  has been APPROVED from 01/01/2024 to 12/31/2024   PA #/Case ID/Reference #: [id number]    Lf 12/22/23 #35

## 2024-01-02 ENCOUNTER — Ambulatory Visit (HOSPITAL_COMMUNITY): Attending: Internal Medicine

## 2024-01-02 DIAGNOSIS — I421 Obstructive hypertrophic cardiomyopathy: Secondary | ICD-10-CM | POA: Diagnosis present

## 2024-01-02 LAB — ECHOCARDIOGRAM LIMITED
Area-P 1/2: 2.95 cm2
S' Lateral: 2.2 cm

## 2024-01-03 ENCOUNTER — Telehealth: Payer: Self-pay | Admitting: Internal Medicine

## 2024-01-03 ENCOUNTER — Encounter: Payer: Self-pay | Admitting: Pulmonary Disease

## 2024-01-03 ENCOUNTER — Ambulatory Visit: Admitting: Pulmonary Disease

## 2024-01-03 ENCOUNTER — Other Ambulatory Visit: Payer: Self-pay

## 2024-01-03 VITALS — BP 118/80 | HR 71 | Ht 62.0 in | Wt 221.0 lb

## 2024-01-03 DIAGNOSIS — R0609 Other forms of dyspnea: Secondary | ICD-10-CM

## 2024-01-03 DIAGNOSIS — I421 Obstructive hypertrophic cardiomyopathy: Secondary | ICD-10-CM

## 2024-01-03 MED ORDER — MAVACAMTEN 2.5 MG PO CAPS: 2.5000 mg | ORAL_CAPSULE | Freq: Every day | ORAL | 5 refills | Status: DC

## 2024-01-03 MED ORDER — BUDESONIDE-FORMOTEROL FUMARATE 160-4.5 MCG/ACT IN AERO
2.0000 | INHALATION_SPRAY | Freq: Two times a day (BID) | RESPIRATORY_TRACT | 12 refills | Status: AC
  Filled 2024-01-03 – 2024-01-15 (×2): qty 10.2, 30d supply, fill #0
  Filled 2024-02-09: qty 10.2, 30d supply, fill #1
  Filled 2024-03-11 – 2024-08-13 (×2): qty 10.2, 30d supply, fill #2
  Filled 2024-09-19: qty 10.2, 30d supply, fill #3

## 2024-01-03 NOTE — Addendum Note (Signed)
 Addended by: Christine Cozier on: 01/03/2024 05:11 PM   Modules accepted: Orders

## 2024-01-03 NOTE — Patient Instructions (Signed)
 I am glad the new heart medicine is helping out a bit  To further evaluate causes for your shortness of breath, pulmonary perspective, I recommend pulmonary function test, these are ordered today  The CT scan shows subtle signs of something could be asthma, this could make shortness of breath worse.  Since the albuterol  inhaler helps I think is worth trying a stronger inhaler.  Takes Symbicort 2 puffs in the morning, 2 puffs in the evening.  Rinse your mouth out after every use.  You can continue albuterol  as needed 2 puffs every 4-6 hours for shortness of breath.  Return to clinic in 2 months with pulmonary function test same day with follow-up visit with Lydia Thompson after, or sooner as needed

## 2024-01-03 NOTE — Progress Notes (Signed)
 @Patient  ID: Lydia Thompson, female    DOB: Aug 16, 1976, 48 y.o.   MRN: [id number]  Chief Complaint  Patient presents with  . Consult    Patient was referred to see a pulmonologist. Fast heart beat and sob on exertion. Started feb 2018.     Referring provider: Anthon Kins, MD  HPI:   48 y.o. woman whom we are seeing for evaluation of dyspnea on exertion.  Reason for referral was marked pulmonary arterial hypertension.  Multiple cardiology notes reviewed.  Most recent PCP note reviewed.  Patient has a history of HOCM.  Followed closely by cardiology.  I reviewed the results of 9 echocardiograms dating back to 2019 and as recently as yesterday, this reveals no evidence of pulmonary hypertension on any of the echocardiograms, no RA or RV enlargement, no RV dysfunction, no significant TR or elevation in PASP/RVSP.  She was recently started earlier this year on new medication for HOCM.  Her NYHA class heart failure symptoms have improved from III - II to I.  Her echocardiogram showed interval improvement as well.  Her dyspnea on exertion has subjectively improved on her report to me today.  Occasionally has to take rest with more strenuous things or long walks.  We discussed that likely cardiac cause is most her largest contributor to her symptoms.  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  We discussed this could be a sign of small airways disease.  She denies any history of asthma.  She does state that she was given inhaler in the past, albuterol  and when she is used it she felt it helped.  She rarely uses it.  We discussed role and rationale for empiric inhaler for possible asthma.  We discussed role rationale for further testing, PFTs.  Questionaires / Pulmonary Flowsheets:   ACT:      No data to display          MMRC:     No data to display          Epworth:      No data to display          Tests:   FENO:   No results found for: "NITRICOXIDE"  PFT:     No data to display          WALK:      No data to display          Imaging: Personally reviewed and as per EMR and discussion in this note ECHOCARDIOGRAM LIMITED Result Date: 01/02/2024    ECHOCARDIOGRAM LIMITED REPORT   Patient Name:   Lydia Thompson [facility] Date of Exam: 01/02/2024 Medical Rec #:  [id number]     Height:       63.0 in Accession #:    [id number]    Weight:       223.8 lb Date of Birth:  08-Nov-1975    BSA:          2.028 m Patient Age:    48 years      BP:           125/84 mmHg Patient Gender: F             HR:           67 bpm. Exam Location:  [address] Procedure: Limited Echo, Limited Color Doppler, Cardiac Doppler, Strain Analysis            and 3D  Echo (Both Spectral and Color Flow Doppler were utilized            during procedure). Indications:    HOCM I42.1; 2.5 MG Camzyos   History:        Patient has prior history of Echocardiogram examinations, most                 recent 12/12/2023. CHF; Risk Factors:Hypertension.  Sonographer:    Joleen Navy RDCS Referring Phys: [id number] [facility] A CHANDRASEKHAR IMPRESSIONS  1. Hypertrophic cardiomyopathy. No LVOT gradient at rest. 10mm LVOT gradient with Valsalva. Left ventricular ejection fraction, by estimation, is 70 to 75%. The left ventricle has hyperdynamic function. There is severe asymmetric left ventricular hypertrophy of the septal segment. Left ventricular diastolic parameters are consistent with Grade I diastolic dysfunction (impaired relaxation). The average left ventricular global longitudinal strain is -17.7 %. The global longitudinal strain is normal.  2. Right ventricular systolic function is normal.  3. Left atrial size was moderately dilated.  4. Minimal SAM. Mild mitral valve regurgitation.  5. The aortic valve is tricuspid. There is mild calcification of the aortic valve. Aortic valve regurgitation is trivial. No aortic stenosis is present. FINDINGS  Left Ventricle:  Hypertrophic cardiomyopathy. No LVOT gradient at rest. 10mm LVOT gradient with Valsalva. Left ventricular ejection fraction, by estimation, is 70 to 75%. The left ventricle has hyperdynamic function. The average left ventricular global longitudinal strain is -17.7 %. Strain was performed and the global longitudinal strain is normal. There is severe asymmetric left ventricular hypertrophy of the septal segment. Left ventricular diastolic parameters are consistent with Grade I diastolic dysfunction (impaired relaxation). Right Ventricle: Right ventricular systolic function is normal. Left Atrium: Left atrial size was moderately dilated. Right Atrium: Right atrial size was not well visualized. Pericardium: There is no evidence of pericardial effusion. Mitral Valve: Minimal SAM. Mild mitral valve regurgitation. Tricuspid Valve: The tricuspid valve is normal in structure. Tricuspid valve regurgitation is trivial. Aortic Valve: The aortic valve is tricuspid. There is mild calcification of the aortic valve. Aortic valve regurgitation is trivial. No aortic stenosis is present. Pulmonic Valve: The pulmonic valve was grossly normal. Venous: The inferior vena cava was not well visualized. LEFT VENTRICLE PLAX 2D LVIDd:         4.20 cm   Diastology LVIDs:         2.20 cm   LV e' medial:    4.91 cm/s LV PW:         1.30 cm   LV E/e' medial:  11.6 LV IVS:        1.40 cm   LV e' lateral:   11.70 cm/s LVOT diam:     2.10 cm   LV E/e' lateral: 4.9 LV SV:         103 LV SV Index:   51        2D Longitudinal Strain LVOT Area:     3.46 cm  2D Strain GLS Avg:     -17.7 %                           3D Volume EF:                          3D EF:        58 %  LV EDV:       120 ml                          LV ESV:       50 ml                          LV SV:        70 ml LEFT ATRIUM         Index LA diam:    5.40 cm 2.66 cm/m  AORTIC VALVE LVOT Vmax:   137.00 cm/s LVOT Vmean:  93.300 cm/s LVOT VTI:    0.298 m  AORTA Ao  Root diam: 2.80 cm MITRAL VALVE MV Area (PHT): 2.95 cm    SHUNTS MV Decel Time: 257 msec    Systemic VTI:  0.30 m MV E velocity: 57.10 cm/s  Systemic Diam: 2.10 cm MV A velocity: 78.10 cm/s MV E/A ratio:  0.73 Jules Oar MD Electronically signed by Jules Oar MD Signature Date/Time: 01/02/2024/5:47:07 PM    Final    ECHOCARDIOGRAM LIMITED Result Date: 12/12/2023    ECHOCARDIOGRAM LIMITED REPORT   Patient Name:   Lydia Thompson Date of Exam: 12/12/2023 Medical Rec #:  [id number]     Height:       63.0 in Accession #:    [id number]    Weight:       223.8 lb Date of Birth:  01-Jun-1976    BSA:          2.028 m Patient Age:    48 years      BP:           117/79 mmHg Patient Gender: F             HR:           81 bpm. Exam Location:  [address] Procedure: Limited Echo, Cardiac Doppler, Limited Color Doppler and Strain            Analysis (Both Spectral and Color Flow Doppler were utilized during            procedure). Indications:    I42.0 HOCM  History:        Patient has prior history of Echocardiogram examinations, most                 recent 11/06/2023. Hypertrophic Cardiomyopathy and CHF,                 Signs/Symptoms:Shortness of Breath and Chest Pain; Risk                 Factors:Family History of Coronary Artery Disease and                 Hypertension. Camzyos - 2.5mg  (decreased from 5mg  on Prior),                 Obesity,.  Sonographer:    Ewing Holiday RDCS Referring Phys: [facility] A CHANDRASEKHAR IMPRESSIONS  1. There is only very subtle evidence of systolic anterior motion of the mitral valve. There is no LV outflow obstruction at rest or with the Valsalva maneuver.  2. Left ventricular ejection fraction, by estimation, is 60 to 65%. The left ventricle has normal function. The left ventricle has no regional wall motion abnormalities. There is moderate asymmetric left ventricular hypertrophy of the basal-septal segment. Left ventricular diastolic parameters are consistent with Grade I diastolic  dysfunction (  impaired relaxation). The average left ventricular global longitudinal strain is -22.0 %. The global longitudinal strain is normal.  3. Right ventricular systolic function is normal. The right ventricular size is normal. There is normal pulmonary artery systolic pressure. The estimated right ventricular systolic pressure is 25.1 mmHg.  4. Left atrial size was severely dilated.  5. The mitral valve is normal in structure. Mild to moderate mitral valve regurgitation. No evidence of mitral stenosis.  6. The aortic valve is tricuspid. Aortic valve regurgitation is trivial. No aortic stenosis is present.  7. The inferior vena cava is normal in size with greater than 50% respiratory variability, suggesting right atrial pressure of 3 mmHg. Comparison(s): No significant change from prior study. Prior images reviewed side by side. FINDINGS  Left Ventricle: Left ventricular ejection fraction, by estimation, is 60 to 65%. The left ventricle has normal function. The left ventricle has no regional wall motion abnormalities. The average left ventricular global longitudinal strain is -22.0 %. Strain was performed and the global longitudinal strain is normal. The left ventricular internal cavity size was normal in size. There is moderate asymmetric left ventricular hypertrophy of the basal-septal segment. Left ventricular diastolic parameters are consistent with Grade I diastolic dysfunction (impaired relaxation). Normal left ventricular filling pressure. Right Ventricle: The right ventricular size is normal. No increase in right ventricular wall thickness. Right ventricular systolic function is normal. There is normal pulmonary artery systolic pressure. The tricuspid regurgitant velocity is 2.35 m/s, and  with an assumed right atrial pressure of 3 mmHg, the estimated right ventricular systolic pressure is 25.1 mmHg. Left Atrium: Left atrial size was severely dilated. Right Atrium: Right atrial size was normal in size.  Pericardium: There is no evidence of pericardial effusion. Mitral Valve: The mitral valve is normal in structure. Mild to moderate mitral valve regurgitation, with centrally-directed jet. No evidence of mitral valve stenosis. Tricuspid Valve: The tricuspid valve is normal in structure. Tricuspid valve regurgitation is trivial. No evidence of tricuspid stenosis. Aortic Valve: The aortic valve is tricuspid. Aortic valve regurgitation is trivial. No aortic stenosis is present. Pulmonic Valve: The pulmonic valve was normal in structure. Pulmonic valve regurgitation is not visualized. No evidence of pulmonic stenosis. Aorta: The aortic root is normal in size and structure. Venous: The inferior vena cava is normal in size with greater than 50% respiratory variability, suggesting right atrial pressure of 3 mmHg. IAS/Shunts: No atrial level shunt detected by color flow Doppler. LEFT VENTRICLE PLAX 2D LVIDd:         4.20 cm   Diastology LVIDs:         2.40 cm   LV e' medial:    10.60 cm/s LV PW:         0.90 cm   LV E/e' medial:  6.6 LV IVS:        1.70 cm   LV e' lateral:   10.30 cm/s LVOT diam:     2.30 cm   LV E/e' lateral: 6.8 LV SV:         119 LV SV Index:   58        2D Longitudinal Strain LVOT Area:     4.15 cm  2D Strain GLS (A4C):   -20.1 %                          2D Strain GLS (A3C):   -20.1 %  2D Strain GLS (A2C):   -25.9 %                          2D Strain GLS Avg:     -22.0 % RIGHT VENTRICLE             IVC RV Basal diam:  3.40 cm     IVC diam: 1.00 cm RV S prime:     14.30 cm/s TAPSE (M-mode): 2.2 cm RVSP:           25.1 mmHg LEFT ATRIUM             Index        RIGHT ATRIUM           Index LA diam:        5.40 cm 2.66 cm/m   RA Pressure: 3.00 mmHg LA Vol (A2C):   76.5 ml 37.72 ml/m  RA Area:     12.20 cm LA Vol (A4C):   82.8 ml 40.82 ml/m  RA Volume:   26.30 ml  12.97 ml/m LA Biplane Vol: 82.9 ml 40.87 ml/m  AORTIC VALVE LVOT Vmax:   130.33 cm/s LVOT Vmean:  92.033 cm/s LVOT  VTI:    0.285 m  AORTA Ao Root diam: 2.70 cm Ao Asc diam:  3.40 cm MITRAL VALVE                  TRICUSPID VALVE MV Area (PHT): cm            TR Peak grad:   22.1 mmHg MV Decel Time: 223 msec       TR Vmax:        235.00 cm/s MR Peak grad:    96.2 mmHg    Estimated RAP:  3.00 mmHg MR Mean grad:    60.5 mmHg    RVSP:           25.1 mmHg MR Vmax:         490.50 cm/s MR Vmean:        361.5 cm/s   SHUNTS MR PISA:         1.57 cm     Systemic VTI:  0.29 m MR PISA Eff ROA: 10 mm       Systemic Diam: 2.30 cm MR PISA Radius:  0.50 cm MV E velocity: 69.70 cm/s MV A velocity: 83.90 cm/s MV E/A ratio:  0.83 Mihai Croitoru MD Electronically signed by Luana Rumple MD Signature Date/Time: 12/12/2023/12:48:02 PM    Final     Lab Results: Reviewed CBC    Component Value Date/Time   WBC 12.7 (H) 12/11/2023 0929   RBC 4.96 12/11/2023 0929   HGB 13.4 12/11/2023 0929   HGB 13.2 10/08/2020 1008   HCT 40.9 12/11/2023 0929   HCT 40.1 10/08/2020 1008   PLT 399.0 12/11/2023 0929   PLT 263 10/08/2020 1008   MCV 82.6 12/11/2023 0929   MCV 82 10/08/2020 1008   MCH 27.2 10/18/2023 1241   MCHC 32.8 12/11/2023 0929   RDW 14.8 12/11/2023 0929   RDW 13.2 10/08/2020 1008   LYMPHSABS 2.8 12/11/2023 0929   LYMPHSABS 3.1 10/08/2020 1008   MONOABS 0.7 12/11/2023 0929   EOSABS 0.3 12/11/2023 0929   EOSABS 0.4 10/08/2020 1008   BASOSABS 0.0 12/11/2023 0929   BASOSABS 0.0 10/08/2020 1008    BMET    Component Value Date/Time   NA 138 12/11/2023 0929   NA 140 10/08/2020 1008  K 3.5 12/11/2023 0929   CL 101 12/11/2023 0929   CO2 29 12/11/2023 0929   GLUCOSE 89 12/11/2023 0929   BUN 13 12/11/2023 0929   BUN 12 10/08/2020 1008   CREATININE 0.77 12/11/2023 0929   CREATININE 0.67 01/13/2015 1117   CALCIUM  9.2 12/11/2023 0929   GFRNONAA >60 10/18/2023 1241   GFRAA 93 10/08/2020 1008    BNP    Component Value Date/Time   BNP 168.1 (H) 08/07/2023 1231    ProBNP    Component Value Date/Time   PROBNP 120.0  (H) 07/24/2023 0845    Specialty Problems       Pulmonary Problems   Acute respiratory failure with hypoxia (HCC)   Hemoptysis    Allergies  Allergen Reactions  . Gabapentin      Caused edema and weight gain    Immunization History  Administered Date(s) Administered  . PNEUMOCOCCAL CONJUGATE-20 12/11/2023  . Tdap 10/18/2017    Past Medical History:  Diagnosis Date  . Anxiety   . Chest pain of uncertain etiology 09/26/2017  . CHF (congestive heart failure) (HCC)   . Hot flashes, menopausal 08/30/2022   Tried OTC medications without help  . Hypertension   . Low libido 08/30/2022    Tobacco History: Social History   Tobacco Use  Smoking Status Never  Smokeless Tobacco Never   Counseling given: Not Answered   Continue to not smoke  Outpatient Encounter Medications as of 01/03/2024  Medication Sig  . aspirin  EC 81 MG tablet Take 1 tablet (81 mg total) by mouth daily. Swallow whole.  . atorvastatin  (LIPITOR) 40 MG tablet Take 1 tablet (40 mg total) by mouth daily.  . budesonide-formoterol (SYMBICORT) 160-4.5 MCG/ACT inhaler Inhale 2 puffs into the lungs in the morning and at bedtime.  . DULoxetine  (CYMBALTA ) 60 MG capsule Take 1 capsule (60 mg total) by mouth daily. Replaces 30 mg dose  . estradiol  (ESTRACE ) 0.1 MG/GM vaginal cream Limit use to minimum amount to control symptoms. (Patient taking differently: Place 1 Applicatorful vaginally as needed (dryness).)  . Fezolinetant  (VEOZAH ) 45 MG TABS Take 1 tablet (45 mg total) by mouth daily at 6 (six) AM.  . furosemide  (LASIX ) 20 MG tablet Take 1 tablet (20 mg total) by mouth 2 (two) times daily.  . LORazepam  (ATIVAN ) 0.5 MG tablet Take 1 tablet (0.5 mg total) by mouth 2 (two) times daily as needed for anxiety.  . losartan  (COZAAR ) 25 MG tablet Take 0.5 tablets (12.5 mg total) by mouth daily.  . mavacamten  (CAMZYOS ) 2.5 MG CAPS capsule Take 1 capsule (2.5 mg total) by mouth daily.  . metoprolol  succinate (TOPROL -XL)  100 MG 24 hr tablet Take 1.5 tablets (150 mg total) by mouth daily. Take with or immediately following a meal.  . ondansetron  (ZOFRAN -ODT) 4 MG disintegrating tablet Take 1 tablet (4 mg total) by mouth every 8 (eight) hours as needed for nausea or vomiting.  . spironolactone  (ALDACTONE ) 25 MG tablet Take 1 tablet (25 mg total) by mouth daily.  . Semaglutide ,0.25 or 0.5MG /DOS, (OZEMPIC , 0.25 OR 0.5 MG/DOSE,) 2 MG/3ML SOPN Inject 0.5 mg into the skin once a week. (Patient not taking: Reported on 01/03/2024)  . Semaglutide -Weight Management 0.5 MG/0.5ML SOAJ Inject 0.5 mg into the skin once a week for 28 days. (Patient not taking: Reported on 01/03/2024)  . [START ON 01/21/2024] Semaglutide -Weight Management 1 MG/0.5ML SOAJ Inject 1 mg into the skin once a week for 28 days. (Patient not taking: Reported on 01/03/2024)  . [START ON  02/19/2024] Semaglutide -Weight Management 1.7 MG/0.75ML SOAJ Inject 1.7 mg into the skin once a week for 28 days. (Patient not taking: Reported on 01/03/2024)  . [START ON 03/19/2024] Semaglutide -Weight Management 2.4 MG/0.75ML SOAJ Inject 2.4 mg into the skin once a week for 28 days. (Patient not taking: Reported on 01/03/2024)   No facility-administered encounter medications on file as of 01/03/2024.     Review of Systems  Review of Systems  No chest pain exertion.  No orthopnea or PND.  Comprehensive review of systems otherwise negative. Physical Exam  BP 118/80 (BP Location: Left Arm, Patient Position: Sitting)   Pulse 71   Ht 5\' 2"  (1.575 m)   Wt 221 lb (100.2 kg)   SpO2 95%   BMI 40.42 kg/m   Wt Readings from Last 5 Encounters:  01/03/24 221 lb (100.2 kg)  12/11/23 223 lb 12.8 oz (101.5 kg)  10/18/23 227 lb (103 kg)  10/17/23 230 lb (104.3 kg)  09/25/23 236 lb (107 kg)    BMI Readings from Last 5 Encounters:  01/03/24 40.42 kg/m  12/11/23 39.64 kg/m  10/18/23 40.21 kg/m  10/17/23 40.74 kg/m  09/25/23 41.81 kg/m     Physical Exam General: Sitting  in chair, no acute distress Eyes: EOMI, no icterus Neck: Supple, no JVP Pulmonary: Clear, normal work of breathing Cardiovascular: Regular rate and rhythm, no murmur noted Abdomen: Nondistended MSK: No synovitis, no joint effusion Neuro: Normal gait, no weakness Psych: Normal mood, full affect  Assessment & Plan:   Dyspnea on exertion: Most likely driven by cardiac etiologies, HOCM in particular.  Marked improvement in symptoms with new medications for this.  NYHA symptom improvement as well as improvement on echocardiogram.  Most recent TTE shows no signs of pulmonary hypertension.  In fact dating back to 2019 on serial echocardiograms, there is no sign of pulmonary hypertension.  This is felt less likely.  If it were present most likely group 2 disease given mitral valve regurgitation, HOCM, diastolic dysfunction, dilated left atrium.  It is also possible there is pulmonary contribution to her symptoms.  She denies a history of asthma. 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  This can indicate small airways disease (as well as pulmonary hypertension but again this is felt less likely).  Improvement in symptoms with use of albuterol  in the past.  Although she uses it rarely.  PFTs for further evaluation.  High-dose Symbicort 2 puff twice daily prescribed today given suspicion of small airways disease based on imaging as well improvement with albuterol .  Assess response.   Return in about 2 months (around 03/04/2024) for f/u Dr. Marygrace Snellen, after PFT.   Guerry Leek, MD 01/03/2024   This appointment required 61 minutes of patient care (this includes precharting, chart review, review of results, face-to-face care, etc.).

## 2024-01-03 NOTE — Telephone Encounter (Signed)
 PSF updated on [facility]  REMS portal.  Refill of mavacamten  2.5 mg PO every day sent to specialty pharmacy on file.  Next echo due prior to 06/30/24.

## 2024-01-03 NOTE — Telephone Encounter (Signed)
 Patient notes that she is doing great.   Since last visit notes no symptoms.  No issues at work. There are no interval hospital/ED visit.   Echo show: - LVOT gradient improved from 51 mm Hg->10 mm Hg. - LVEF 70% - strain was not appropriately acquired.  No chest pain or pressure .  No SOB/DOE and no PND/Orthopnea.  No weight gain or leg swelling.  No palpitations or syncope.  NYHA I Maintenance therapy 2.5 mg mavacamten : 6 months protocol  Gloriann Larger, MD FASE [facility] Cardiologist [facility]  [address] [place], [place] [address] [phone]  1:07 PM

## 2024-01-11 ENCOUNTER — Other Ambulatory Visit: Payer: Self-pay

## 2024-01-11 ENCOUNTER — Encounter: Payer: Self-pay | Admitting: Internal Medicine

## 2024-01-11 DIAGNOSIS — R232 Flushing: Secondary | ICD-10-CM

## 2024-01-11 DIAGNOSIS — F419 Anxiety disorder, unspecified: Secondary | ICD-10-CM

## 2024-01-12 ENCOUNTER — Other Ambulatory Visit: Payer: Self-pay

## 2024-01-12 MED ORDER — VENLAFAXINE HCL ER 37.5 MG PO CP24
37.5000 mg | ORAL_CAPSULE | Freq: Every day | ORAL | 1 refills | Status: DC
  Filled 2024-01-12: qty 90, 90d supply, fill #0
  Filled 2024-04-09: qty 90, 90d supply, fill #1

## 2024-01-13 ENCOUNTER — Other Ambulatory Visit (HOSPITAL_BASED_OUTPATIENT_CLINIC_OR_DEPARTMENT_OTHER): Payer: Self-pay

## 2024-01-13 ENCOUNTER — Other Ambulatory Visit: Payer: Self-pay

## 2024-01-14 ENCOUNTER — Other Ambulatory Visit: Payer: Self-pay | Admitting: Internal Medicine

## 2024-01-14 DIAGNOSIS — F419 Anxiety disorder, unspecified: Secondary | ICD-10-CM

## 2024-01-15 ENCOUNTER — Other Ambulatory Visit: Payer: Self-pay | Admitting: Internal Medicine

## 2024-01-15 DIAGNOSIS — F419 Anxiety disorder, unspecified: Secondary | ICD-10-CM

## 2024-01-16 ENCOUNTER — Other Ambulatory Visit: Payer: Self-pay

## 2024-01-16 ENCOUNTER — Other Ambulatory Visit: Payer: Self-pay | Admitting: Internal Medicine

## 2024-01-16 DIAGNOSIS — F419 Anxiety disorder, unspecified: Secondary | ICD-10-CM

## 2024-01-17 ENCOUNTER — Other Ambulatory Visit: Payer: Self-pay

## 2024-01-17 ENCOUNTER — Telehealth: Payer: Self-pay

## 2024-01-17 ENCOUNTER — Other Ambulatory Visit: Payer: Self-pay | Admitting: Internal Medicine

## 2024-01-17 ENCOUNTER — Other Ambulatory Visit (HOSPITAL_COMMUNITY): Payer: Self-pay

## 2024-01-17 DIAGNOSIS — F419 Anxiety disorder, unspecified: Secondary | ICD-10-CM

## 2024-01-17 NOTE — Telephone Encounter (Signed)
-----   Message from Melissa D Maccia sent at 01/17/2024  9:25 AM EDT ----- Can you try another PA for Wegovy  for hx of TIA?

## 2024-01-17 NOTE — Telephone Encounter (Signed)
 Pharmacy Patient Advocate Encounter   Received notification from Physician's Office that prior authorization for WEGOVY  is required/requested.   Insurance verification completed.   The patient is insured through [employer] [facility] .   Per test claim: Refill too soon. PA is not needed at this time. Medication was filled 01/16/24. Next eligible fill date is 02/07/24.  Looks like appeal was done and approved till 06/19/24

## 2024-01-17 NOTE — Telephone Encounter (Signed)
 Pharmacy Patient Advocate Encounter   Received notification from Physician's Office that prior authorization for WEGOVY  is required/requested.   Insurance verification completed.   The patient is insured through [employer] [facility] .   Per test claim: Refill too soon. PA is not needed at this time. Medication was filled 01/16/24. Next eligible fill date is 02/07/24.

## 2024-01-18 ENCOUNTER — Other Ambulatory Visit: Payer: Self-pay

## 2024-01-19 ENCOUNTER — Ambulatory Visit: Payer: 59 | Attending: Internal Medicine | Admitting: Internal Medicine

## 2024-01-19 ENCOUNTER — Other Ambulatory Visit: Payer: Self-pay

## 2024-01-19 ENCOUNTER — Encounter: Payer: Self-pay | Admitting: Internal Medicine

## 2024-01-19 VITALS — BP 102/72 | HR 70 | Ht 62.0 in | Wt 220.6 lb

## 2024-01-19 DIAGNOSIS — Z8673 Personal history of transient ischemic attack (TIA), and cerebral infarction without residual deficits: Secondary | ICD-10-CM

## 2024-01-19 DIAGNOSIS — I422 Other hypertrophic cardiomyopathy: Secondary | ICD-10-CM

## 2024-01-19 DIAGNOSIS — I471 Supraventricular tachycardia, unspecified: Secondary | ICD-10-CM

## 2024-01-19 NOTE — Progress Notes (Signed)
 Cardiology Office Note:  .    Date:  01/19/2024  ID:  Lydia Thompson, DOB Feb 09, 1976, MRN [id number] PCP: Anthon Kins, MD  [facility] Providers Cardiologist:  Cody Das, MD     CC: f/u HCM  History of Present Illness: .    Lydia Thompson is a 48 y.o. female with hypertrophic cardiomyopathy who presents for a six-month follow-up after starting cardiac myosin inhibitors.  She has experienced significant improvement in her symptoms since starting cardiac myosin inhibitors, particularly noting that she can now walk from the car park to inside without being out of breath. She is currently asymptomatic and able to walk without difficulty.  She has started on Wegovy  since her last visit and mentions weight fluctuations, with her weight being 217 pounds when measured at home. She notes that her clothes are fitting smaller and her stockings are going down, indicating some weight loss.  Her current lifestyle includes working and managing her son's [employer] basketball activities. She states that these activities, along with her husband's illness, contribute to her stress levels. She wants to increase her physical activity, specifically mentioning a desire to engage in chair exercises and swimming. However, she cites financial constraints and time limitations as barriers to accessing these activities.  Discussed the use of AI scribe software for clinical note transcription with the patient, who gave verbal consent to proceed.   Relevant histories: .  Social  - has one son, husband has Lung CA; no biologic children or siblings ROS: As per HPI.   Studies Reviewed: .     Cardiac Studies & Procedures   ______________________________________________________________________________________________     ECHOCARDIOGRAM  ECHOCARDIOGRAM LIMITED 01/02/2024  Narrative ECHOCARDIOGRAM LIMITED REPORT    Patient Name:   Lydia Thompson Date of Exam: 01/02/2024 Medical Rec #:   [id number]     Height:       63.0 in Accession #:    [id number]    Weight:       223.8 lb Date of Birth:  [birth year]    BSA:          2.028 m Patient Age:    47 years      BP:           125/84 mmHg Patient Gender: F             HR:           67 bpm. Exam Location:  [facility]  Procedure: Limited Echo, Limited Color Doppler, Cardiac Doppler, Strain Analysis and 3D Echo (Both Spectral and Color Flow Doppler were utilized during procedure).  Indications:    HOCM I42.1; 2.5 MG Camzyos   History:        Patient has prior history of Echocardiogram examinations, most recent 12/12/2023. CHF; Risk Factors:Hypertension.  Sonographer:    Joleen Navy RDCS Referring Phys: [id number] [facility] A Kaydon Husby  IMPRESSIONS   1. Hypertrophic cardiomyopathy. No LVOT gradient at rest. 10mm LVOT gradient with Valsalva. Left ventricular ejection fraction, by estimation, is 70 to 75%. The left ventricle has hyperdynamic function. There is severe asymmetric left ventricular hypertrophy of the septal segment. Left ventricular diastolic parameters are consistent with Grade I diastolic dysfunction (impaired relaxation). The average left ventricular global longitudinal strain is -17.7 %. The global longitudinal strain is normal. 2. Right ventricular systolic function is normal. 3. Left atrial size was moderately dilated. 4. Minimal SAM. Mild mitral valve regurgitation. 5. The aortic valve is tricuspid. There is mild calcification of the  aortic valve. Aortic valve regurgitation is trivial. No aortic stenosis is present.  FINDINGS Left Ventricle: Hypertrophic cardiomyopathy. No LVOT gradient at rest. 10mm LVOT gradient with Valsalva. Left ventricular ejection fraction, by estimation, is 70 to 75%. The left ventricle has hyperdynamic function. The average left ventricular global longitudinal strain is -17.7 %. Strain was performed and the global longitudinal strain is normal. There is severe asymmetric left  ventricular hypertrophy of the septal segment. Left ventricular diastolic parameters are consistent with Grade I diastolic dysfunction (impaired relaxation).  Right Ventricle: Right ventricular systolic function is normal.  Left Atrium: Left atrial size was moderately dilated.  Right Atrium: Right atrial size was not well visualized.  Pericardium: There is no evidence of pericardial effusion.  Mitral Valve: Minimal SAM. Mild mitral valve regurgitation.  Tricuspid Valve: The tricuspid valve is normal in structure. Tricuspid valve regurgitation is trivial.  Aortic Valve: The aortic valve is tricuspid. There is mild calcification of the aortic valve. Aortic valve regurgitation is trivial. No aortic stenosis is present.  Pulmonic Valve: The pulmonic valve was grossly normal.  Venous: The inferior vena cava was not well visualized.  LEFT VENTRICLE PLAX 2D LVIDd:         4.20 cm   Diastology LVIDs:         2.20 cm   LV e' medial:    4.91 cm/s LV PW:         1.30 cm   LV E/e' medial:  11.6 LV IVS:        1.40 cm   LV e' lateral:   11.70 cm/s LVOT diam:     2.10 cm   LV E/e' lateral: 4.9 LV SV:         103 LV SV Index:   51        2D Longitudinal Strain LVOT Area:     3.46 cm  2D Strain GLS Avg:     -17.7 %  3D Volume EF: 3D EF:        58 % LV EDV:       120 ml LV ESV:       50 ml LV SV:        70 ml  LEFT ATRIUM         Index LA diam:    5.40 cm 2.66 cm/m AORTIC VALVE LVOT Vmax:   137.00 cm/s LVOT Vmean:  93.300 cm/s LVOT VTI:    0.298 m  AORTA Ao Root diam: 2.80 cm  MITRAL VALVE MV Area (PHT): 2.95 cm    SHUNTS MV Decel Time: 257 msec    Systemic VTI:  0.30 m MV E velocity: 57.10 cm/s  Systemic Diam: 2.10 cm MV A velocity: 78.10 cm/s MV E/A ratio:  0.73  Jules Oar MD Electronically signed by Jules Oar MD Signature Date/Time: 01/02/2024/5:47:07 PM    Final    MONITORS  LONG TERM MONITOR (3-14 DAYS) 08/30/2023  Narrative Zio patch monitor 13  days 08/03/2023 - 08/17/2023: Dominant rhythm: Sinus. HR 50-121 bpm. Avg HR 74 bpm, in sinus rhythm. 3 episodes of atrial tachycardia, fastest at 154 bpm for 5 beats, longest for 5 beats at 140 bpm. <1% isolated SVE, couplet/triplets. 1 episode of VT, fastest at 182 bpm for 5 bears. <1% isolated VE, couplets. No atrial fibrillation/atrial flutter/VT/high grade AV block, sinus pause >3sec noted. 14 patient triggered events, most correlated with SVE, VE.   CT SCANS  CT CORONARY MORPH W/CTA COR W/SCORE 10/17/2018  Addendum 10/17/2018 12:09  PM ADDENDUM REPORT: 10/17/2018 12:07  CLINICAL DATA:  48 year old female with h/o hypertension, obesity, chest pain and abnormal treadmill stress test.  EXAM: Cardiac/Coronary  CT  TECHNIQUE: The patient was scanned on a [employer].  FINDINGS: A 120 kV prospective scan was triggered in the descending thoracic aorta at 111 HU's. Axial non-contrast 3 mm slices were carried out through the heart. The data set was analyzed on a dedicated work station and scored using the Agatson method. Gantry rotation speed was 250 msecs and collimation was .6 mm. No beta blockade and 0.8 mg of sl NTG was given. The 3D data set was reconstructed in 5% intervals of the 67-82 % of the R-R cycle. Diastolic phases were analyzed on a dedicated work station using MPR, MIP and VRT modes. The patient received 80 cc of contrast.  Aorta:  Normal size.  No calcifications.  No dissection.  Aortic Valve:  Trileaflet.  No calcifications.  Coronary Arteries:  Normal coronary origin.  Right dominance.  RCA is a large dominant artery that gives rise to PDA and PLVB. There is no plaque.  Left main is a large artery that gives rise to LAD and LCX arteries. Left main has no plaque.  LAD is a large vessel that wraps around the apex and has no plaque.  LCX is a non-dominant artery that gives rise to one large OM1 branch. There is no plaque.  Other  findings:  Normal pulmonary vein drainage into the left atrium.  Normal let atrial appendage without a thrombus.  IMPRESSION: 1. Coronary calcium  score of 0. This was 0 percentile for age and sex matched control.  2. Normal coronary origin with right dominance.  3. No evidence of CAD.  4. Moderately dilated pulmonary artery measuring 36 mm suggestive of pulmonary hypertension.   Electronically Signed By: Christoper Crafts On: 10/17/2018 12:07  Narrative EXAM: OVER-READ INTERPRETATION  CT CHEST  The following report is an over-read performed by radiologist Dr. Erica Hau of [facility] on 10/17/2018. This over-read does not include interpretation of cardiac or coronary anatomy or pathology. The coronary CTA interpretation by the cardiologist is attached.  COMPARISON:  None.  FINDINGS: Vascular: Dilated central pulmonary arteries with the main pulmonary artery segment measuring as much as 3.4 cm.  Mediastinum/Nodes: Visualized mediastinum and hilar regions show no evidence of lymphadenopathy or masses.  Lungs/Pleura: Minimal atelectasis at both lung bases. Visualized lungs show no evidence of pulmonary edema, consolidation, pneumothorax, nodule or pleural fluid.  Upper Abdomen: Probable small hiatal hernia.  Musculoskeletal: No chest wall mass or suspicious bone lesions identified.  IMPRESSION: 1. Enlarged central pulmonary arteries are suggestive of underlying pulmonary hypertension. 2. Probable small hiatal hernia.  Electronically Signed: By: Erica Hau M.D. On: 10/17/2018 09:40   CARDIAC MRI  MR CARDIAC MORPHOLOGY W WO CONTRAST 07/26/2023  Narrative CLINICAL DATA:  HCM  EXAM: CARDIAC MRI  TECHNIQUE: The patient was scanned on a 1.5 Tesla Siemens magnet. A dedicated cardiac coil was used. Functional imaging was done using Fiesta sequences. 2,3, and 4 chamber views were done to assess for RWMA's. Modified Simpson's rule using  a short axis stack was used to calculate an ejection fraction on a dedicated work [employer]. The patient received 13 cc of Gadavist . After 10 minutes inversion recovery sequences were used to assess for infiltration and scar tissue. Velocity flow mapping performed in the ascending aorta and main pulmonary artery.  CONTRAST:  13 cc  of Gadavist   FINDINGS: 1. Normal left ventricular size, Severe asymmetric LV basal-septal wall thickness, measuring upto 1.8cm. Normal systolic function (LVEF = 57%).  There are no regional wall motion abnormalities.  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  There is no late gadolinium enhancement in the left ventricular myocardium.  LVEDV: 159 ml  LVESV: 69 ml  SV: 90 ml  CO: 5L/min  Myocardial mass: 145g  2. Normal right ventricular size, thickness and systolic function (RVEF = 54%). There are no regional wall motion abnormalities.  3.  Moderately dilated left atrial size, normal right atrial size.  4. Normal size of the aortic root, ascending aorta and pulmonary artery.  5. Mild mitral regurgitation. no significant valvular abnormalities.  6.  Normal pericardium.  No pericardial effusion.  IMPRESSION: 1. Normal LV size and systolic function, LVEF = 57%.  2. Severe asymmetric LV basal-septal wall thickness, measuring upto 1.8 cm.  3. 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  4. There is no late gadolinium enhancement in the left ventricular myocardium.  5. Normal RV function.  6. 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   Electronically Signed By: Constancia Delton M.D. On: 07/26/2023 18:17   ______________________________________________________________________________________________       Physical Exam:    VS:  BP 102/72 (BP Location: Left Arm)   Pulse 70   Ht 5\' 2"  (1.575 m)    Wt 220 lb 9.6 oz (100.1 kg)   SpO2 94%   BMI 40.35 kg/m    Wt Readings from Last 3 Encounters:  01/19/24 220 lb 9.6 oz (100.1 kg)  01/03/24 221 lb (100.2 kg)  12/11/23 223 lb 12.8 oz (101.5 kg)    Gen: no distress, morbid obesity  Neck: No JVD  Cardiac: No Rubs or Gallops, soft systolic murmur, RRR +2 radial pulses Respiratory: Clear to auscultation bilaterally, normal effort, normal  respiratory rate GI: Soft, nontender, non-distended  MS: No  edema;  moves all extremities Integument: Skin feels warm Neuro:  At time of evaluation, alert and oriented to person/place/time/situation  Psych: Normal affect, patient feels ok   ASSESSMENT AND PLAN: .    Hypertrophic Cardiomyopathy - Maximal thickness 18 mm, hx of oHCM with LVOT gradient 51 mm HG - on therapy NYHA I - hx of TIA by hx only with hx of asymptomatic SVT; no evidence of AF or AFL - Hypertrophic cardiomyopathy with low risk of sudden cardiac death. Symptoms have improved with cardiac myosin inhibitors, allowing for increased physical activity without dyspnea. Echocardiogram shows LVOT gradient with Valsalva at 10 and EF at 70%. Discussed the importance of exercise in managing HCM and the potential risks of heavy lifting. Preference for medication over surgery due to its reversibility and the availability of surgical options if needed. - Schedule echocardiogram in 6 months - Schedule follow-up with Tessa Conte-PA C in 3 months - Schedule follow-up with cardiologist in 9 months - no change to therapy  Exercise Prescription  Frequency: three days per week to start  Intensity:  60 to 90 percent of heart rate reserve. Time:  20 minutes per session with 5 min increase per session Type:  walking, chair aerobics Volume:  goal of 60 minute sessions Step goal:  7000 per week with goal to increase by 500 each weak Progression:  gradually increase the intensity or duration of exercise. Limitations:  Limitation in burst activity (we  used heavy weight lifting over-head examples)  No Major exclusion criteria  including:  -a history of exercise-induced syncope or ventricular arrhythmias;  -medically refractory LVOT obstruction being evaluated for septal reduction therapy;  -Less than 3 months after septal reduction therapy or ICD placement;  - history of hypotensive response with exercise testing (>20 mm Hg decrease of systolic blood pressure from baseline blood pressure or an initial increase in systolic blood pressure followed by a decrease of systolic blood pressure >20 mm Hg) clinical decompensation in the previous 3 months, defined as [place]  Heart Association class IV congestive heart failure symptoms or [place] Cardiovascular Society class IV angina  - left ventricular ejection fraction less than 55% by echocardiography; life expectancy less than 12 month - pregnant or planned pregnancy  Obesity Obesity management with recent initiation of Wegovy . Weight has fluctuated but there is a subjective improvement in clothing fit. Discussed the role of exercise in weight management and overall health. Financial constraints limit access to certain exercise facilities.   Gloriann Larger, MD [facility] Cardiologist [facility]  [address] [place], [address] [place], [place] [address] [phone]  9:29 AM

## 2024-01-19 NOTE — Patient Instructions (Signed)
 Medication Instructions:  Your physician recommends that you continue on your current medications as directed. Please refer to the Current Medication list given to you today.  *If you need a refill on your cardiac medications before your next appointment, please call your pharmacy*  Lab Work: NONE  If you have labs (blood work) drawn today and your tests are completely normal, you will receive your results only by: MyChart Message (if you have MyChart) OR A paper copy in the mail If you have any lab test that is abnormal or we need to change your treatment, we will call you to review the results.  Testing/Procedures: NONE  Follow-Up: At [facility], you and your health needs are our priority.  As part of our continuing mission to provide you with exceptional heart care, our providers are all part of one team.  This team includes your primary Cardiologist (physician) and Advanced Practice Providers or APPs (Physician Assistants and Nurse Practitioners) who all work together to provide you with the care you need, when you need it.  Your next appointment:   3 month(s)  Provider:   Lovette Rud, PA-C      Then, Gloriann Larger, MD will plan to see you again in 9 month(s).

## 2024-01-23 ENCOUNTER — Telehealth: Payer: Self-pay | Admitting: Pharmacy Technician

## 2024-01-23 NOTE — Telephone Encounter (Signed)
 Received cmm for prior authorization for camzyos  but this was already approved per another encounter      I called [facility] [phone] to ask about medication. They said we needed to send the prior authorization approval we received to their fax at [phone]. I faxed them as requested and called and left the patient a message

## 2024-01-24 ENCOUNTER — Other Ambulatory Visit: Payer: Self-pay

## 2024-01-24 ENCOUNTER — Encounter (HOSPITAL_BASED_OUTPATIENT_CLINIC_OR_DEPARTMENT_OTHER): Payer: Self-pay

## 2024-01-24 ENCOUNTER — Other Ambulatory Visit (HOSPITAL_COMMUNITY): Payer: Self-pay

## 2024-01-24 ENCOUNTER — Telehealth (HOSPITAL_BASED_OUTPATIENT_CLINIC_OR_DEPARTMENT_OTHER): Payer: Self-pay | Admitting: Pulmonary Disease

## 2024-01-25 ENCOUNTER — Other Ambulatory Visit: Payer: Self-pay

## 2024-01-25 MED ORDER — CAMZYOS 2.5 MG PO CAPS
2.5000 mg | ORAL_CAPSULE | Freq: Every day | ORAL | 0 refills | Status: DC
  Filled 2024-01-26: qty 35, 35d supply, fill #0

## 2024-01-26 ENCOUNTER — Other Ambulatory Visit: Payer: Self-pay

## 2024-01-26 ENCOUNTER — Other Ambulatory Visit (HOSPITAL_COMMUNITY): Payer: Self-pay

## 2024-02-11 ENCOUNTER — Other Ambulatory Visit: Payer: Self-pay | Admitting: Internal Medicine

## 2024-02-11 DIAGNOSIS — I5032 Chronic diastolic (congestive) heart failure: Secondary | ICD-10-CM

## 2024-02-11 DIAGNOSIS — Z8673 Personal history of transient ischemic attack (TIA), and cerebral infarction without residual deficits: Secondary | ICD-10-CM

## 2024-02-11 DIAGNOSIS — I422 Other hypertrophic cardiomyopathy: Secondary | ICD-10-CM

## 2024-02-11 MED ORDER — WEGOVY 0.25 MG/0.5ML ~~LOC~~ SOAJ
0.2500 mg | SUBCUTANEOUS | 0 refills | Status: DC
  Filled 2024-02-11: qty 2, 28d supply, fill #0

## 2024-02-12 ENCOUNTER — Other Ambulatory Visit: Payer: Self-pay

## 2024-02-14 ENCOUNTER — Other Ambulatory Visit: Payer: Self-pay

## 2024-02-19 ENCOUNTER — Other Ambulatory Visit: Payer: Self-pay

## 2024-02-20 ENCOUNTER — Other Ambulatory Visit: Payer: Self-pay

## 2024-02-20 MED ORDER — FUROSEMIDE 20 MG PO TABS
20.0000 mg | ORAL_TABLET | Freq: Two times a day (BID) | ORAL | 0 refills | Status: DC
  Filled 2024-02-20 – 2024-03-18 (×8): qty 60, 30d supply, fill #0

## 2024-02-21 ENCOUNTER — Other Ambulatory Visit: Payer: Self-pay

## 2024-02-21 ENCOUNTER — Encounter: Payer: Self-pay | Admitting: Internal Medicine

## 2024-02-28 ENCOUNTER — Other Ambulatory Visit: Payer: Self-pay

## 2024-02-28 ENCOUNTER — Telehealth: Payer: Self-pay

## 2024-02-28 DIAGNOSIS — Z1211 Encounter for screening for malignant neoplasm of colon: Secondary | ICD-10-CM

## 2024-02-28 NOTE — Telephone Encounter (Signed)
 Called pt and advised missing Colon Cancer screening. Pt advised received Cologuard but willing to complete Colonoscopy for longer time frame between screenings. Advised [facility] phone number to call and schedule an appt for screening.

## 2024-03-04 ENCOUNTER — Other Ambulatory Visit: Payer: Self-pay

## 2024-03-05 ENCOUNTER — Other Ambulatory Visit: Payer: Self-pay

## 2024-03-06 ENCOUNTER — Other Ambulatory Visit: Payer: Self-pay

## 2024-03-08 ENCOUNTER — Other Ambulatory Visit: Payer: Self-pay

## 2024-03-18 ENCOUNTER — Other Ambulatory Visit: Payer: Self-pay

## 2024-03-18 ENCOUNTER — Other Ambulatory Visit: Payer: Self-pay | Admitting: Internal Medicine

## 2024-03-18 DIAGNOSIS — I5032 Chronic diastolic (congestive) heart failure: Secondary | ICD-10-CM

## 2024-03-18 DIAGNOSIS — Z8673 Personal history of transient ischemic attack (TIA), and cerebral infarction without residual deficits: Secondary | ICD-10-CM

## 2024-03-18 DIAGNOSIS — I422 Other hypertrophic cardiomyopathy: Secondary | ICD-10-CM

## 2024-03-18 MED ORDER — WEGOVY 0.25 MG/0.5ML ~~LOC~~ SOAJ
0.2500 mg | SUBCUTANEOUS | 0 refills | Status: DC
  Filled 2024-03-18: qty 2, 28d supply, fill #0

## 2024-03-20 ENCOUNTER — Other Ambulatory Visit: Payer: Self-pay

## 2024-03-21 ENCOUNTER — Ambulatory Visit (INDEPENDENT_AMBULATORY_CARE_PROVIDER_SITE_OTHER): Admitting: Internal Medicine

## 2024-03-21 ENCOUNTER — Encounter: Payer: Self-pay | Admitting: Internal Medicine

## 2024-03-21 ENCOUNTER — Other Ambulatory Visit: Payer: Self-pay

## 2024-03-21 VITALS — Temp 98.1°F | Ht 62.0 in | Wt 225.0 lb

## 2024-03-21 DIAGNOSIS — Z8673 Personal history of transient ischemic attack (TIA), and cerebral infarction without residual deficits: Secondary | ICD-10-CM

## 2024-03-21 DIAGNOSIS — I5032 Chronic diastolic (congestive) heart failure: Secondary | ICD-10-CM | POA: Diagnosis not present

## 2024-03-21 DIAGNOSIS — Z634 Disappearance and death of family member: Secondary | ICD-10-CM | POA: Diagnosis not present

## 2024-03-21 MED ORDER — WEGOVY 1 MG/0.5ML ~~LOC~~ SOAJ
1.0000 mg | SUBCUTANEOUS | 2 refills | Status: DC
  Filled 2024-03-21: qty 2, 28d supply, fill #0
  Filled 2024-04-11 – 2024-04-12 (×2): qty 2, 28d supply, fill #1
  Filled 2024-05-09: qty 2, 28d supply, fill #2

## 2024-03-21 MED ORDER — LORAZEPAM 1 MG PO TABS
1.0000 mg | ORAL_TABLET | Freq: Two times a day (BID) | ORAL | 1 refills | Status: DC | PRN
  Filled 2024-03-21: qty 20, 10d supply, fill #0
  Filled 2024-04-04: qty 20, 10d supply, fill #1

## 2024-03-21 MED ORDER — FUROSEMIDE 40 MG PO TABS
40.0000 mg | ORAL_TABLET | Freq: Every day | ORAL | 3 refills | Status: DC
  Filled 2024-03-21: qty 30, 30d supply, fill #0
  Filled 2024-04-15: qty 30, 30d supply, fill #1
  Filled 2024-04-15: qty 90, 90d supply, fill #1

## 2024-03-21 NOTE — Assessment & Plan Note (Signed)
 Currently on Wegovy  for weight management, with plans to increase the dosage to improve efficacy, aiming to reach 1 mg in a month. Increase Wegovy  dosage to 0.5 mg, planning to increase to 1 mg in a month, and monitor for potential side effects such as nausea.

## 2024-03-21 NOTE — Assessment & Plan Note (Signed)
 She shows symptoms of fluid retention, likely worsened by stress and emotional distress, with a risk of hospitalization due to fluid overload and potential stress-induced cardiomyopathy. Increase furosemide  (Lasix ) to 40 mg twice daily to manage fluid retention. Schedule a follow-up appointment next week to monitor fluid status and heart health. Advise on reducing salt intake to manage fluid retention and discuss the importance of balancing fluid intake to avoid dehydration while managing fluid retention.

## 2024-03-21 NOTE — Progress Notes (Signed)
 ==============================  [facility]: [phone]   -- Medical Office Visit --  Patient: Lydia Thompson      Age: 48 y.o.       Sex:  female  Date:   03/21/2024 Today's Healthcare Provider: Bernardino KANDICE Cone, MD  ==============================   Chief Complaint: Medication Refill Bereavement, ativan . Husband died  last week.  Discussed the use of AI scribe software for clinical note transcription with the patient, who gave verbal consent to proceed.  History of Present Illness 48 year old female with heart failure who presents with increased crying spells and fluid retention following the death of her husband.  She has been experiencing persistent crying spells since her husband's passing a week ago. She has significant emotional support from friends and family and is staying at her husband's best friend's house. She is concerned about her son's well-being, noting his strength and support despite the loss.  She reports swelling in her hands, with difficulty removing rings due to increased fluid retention. She is taking Lasix  20 mg twice daily for fluid management but has increased her fluid intake, including sodas and juices, which may be contributing to her symptoms. She has a history of heart failure and is concerned about the impact of stress on her heart health. Friends are worried about her condition, and she is on medication that helps manage her heart failure.  She is currently taking Wegovy  and is interested in increasing the dose, as she feels the current dose is not as effective as previous medications. She recalls a prior medication that was more effective, possibly due to a higher dose.  She has been drinking more fluids than usual, but not alcohol, except for one shot of vodka at her husband's funeral. Background Reviewed: Problem List: has Essential hypertension; Hypertrophic cardiomyopathy (HCC); H/O TIA (transient ischemic attack)  and stroke; Chronic heart failure with preserved ejection fraction (HCC); Morbid obesity (HCC); Post-menopause atrophic vaginitis; Dizziness; Pulmonary artery hypertension (HCC); Hot flashes, menopausal; Low libido; Nausea; Diastolic dysfunction; Anxiety disorder; Acute respiratory failure with hypoxia (HCC); SVT (supraventricular tachycardia) (HCC); Hypokalemia; Hemoptysis; Hot flashes; Cysts of both ovaries; and Gastroesophageal reflux disease without esophagitis on their problem list. Past Medical History:  has a past medical history of Anxiety, Chest pain of uncertain etiology (09/26/2017), CHF (congestive heart failure) (HCC), Hot flashes, menopausal (08/30/2022), Hypertension, and Low libido (08/30/2022). Past Surgical History:   has a past surgical history that includes Abdominal hysterectomy (08/23/2003). Social History:   reports that she has never smoked. She has never used smokeless tobacco. She reports current alcohol use. She reports that she does not use drugs. Family History:  family history includes Arthritis in her paternal uncle; Asthma in her paternal uncle; Cancer in her maternal grandmother; Cancer (age of onset: 37) in her mother; Heart disease in her paternal uncle; Hypertension in her mother and paternal uncle; Other in her sister. Allergies:  is allergic to gabapentin .   Medication Reconciliation: Current Outpatient Medications on File Prior to Visit  Medication Sig   aspirin  EC 81 MG tablet Take 1 tablet (81 mg total) by mouth daily. Swallow whole.   atorvastatin  (LIPITOR) 40 MG tablet Take 1 tablet (40 mg total) by mouth daily.   budesonide -formoterol  (SYMBICORT ) 160-4.5 MCG/ACT inhaler Inhale 2 puffs into the lungs in the morning and at bedtime.   estradiol  (ESTRACE ) 0.1 MG/GM vaginal cream Limit use to minimum amount to control symptoms. (Patient taking differently: Place 1 Applicatorful vaginally as  needed (dryness).)   Fezolinetant  (VEOZAH ) 45 MG TABS Take 1 tablet (45 mg  total) by mouth daily at 6 (six) AM.   losartan  (COZAAR ) 25 MG tablet Take 0.5 tablets (12.5 mg total) by mouth daily.   mavacamten  (CAMZYOS ) 2.5 MG CAPS capsule Take 1 capsule (2.5 mg total) by mouth daily.   mavacamten  (CAMZYOS ) 2.5 MG CAPS capsule Take 1 capsule (2.5 mg total) by mouth daily.   metoprolol  succinate (TOPROL -XL) 100 MG 24 hr tablet Take 1.5 tablets (150 mg total) by mouth daily. Take with or immediately following a meal.   ondansetron  (ZOFRAN -ODT) 4 MG disintegrating tablet Take 1 tablet (4 mg total) by mouth every 8 (eight) hours as needed for nausea or vomiting.   spironolactone  (ALDACTONE ) 25 MG tablet Take 1 tablet (25 mg total) by mouth daily.   venlafaxine  XR (EFFEXOR  XR) 37.5 MG 24 hr capsule Take 1 capsule (37.5 mg total) by mouth daily with breakfast. Replaces duloxetine  due to recall   No current facility-administered medications on file prior to visit.   Medications Discontinued During This Encounter  Medication Reason   LORazepam  (ATIVAN ) 0.5 MG tablet    furosemide  (LASIX ) 20 MG tablet    furosemide  (LASIX ) 20 MG tablet    Semaglutide -Weight Management (WEGOVY ) 0.25 MG/0.5ML SOAJ Completed Course     Physical Exam:    03/21/2024    9:20 AM 01/19/2024    8:37 AM 01/03/2024    9:53 AM  Vitals with BMI  Height 5' 2 5' 2 5' 2  Weight 225 lbs 220 lbs 10 oz 221 lbs  BMI 41.14 40.34 40.41  Systolic  102 118  Diastolic  72 80  Pulse  70 71  Vital signs reviewed.  Nursing notes reviewed. Weight trend reviewed. Physical Exam General Appearance:  No acute distress appreciable.   Well-groomed, healthy-appearing female.  Well proportioned with no abnormal fat distribution.  Good muscle tone. Pulmonary:  Normal work of breathing at rest, no respiratory distress apparent.    Musculoskeletal: All extremities are intact.  Neurological:  Awake, alert, oriented, and engaged.  No obvious focal neurological deficits or cognitive impairments.  Sensorium seems unclouded.    Speech is clear and coherent with logical content. Tearful,depressed  Results:    12/11/2023    8:31 AM 09/26/2023    1:41 PM 09/06/2023    8:38 AM 09/04/2023    9:40 AM  PHQ 2/9 Scores  PHQ - 2 Score 0 0 0 0  PHQ- 9 Score 0  3     Appointment on 01/02/2024  Component Date Value Ref Range Status   S' Lateral 01/02/2024 2.20  cm Final   Area-P 1/2 01/02/2024 2.95  cm2 Final   Est EF 01/02/2024 70 - 75%   Final  Appointment on 12/12/2023  Component Date Value Ref Range Status   Area-P 1/2 12/12/2023 3.40  cm2 Final   S' Lateral 12/12/2023 2.40  cm Final   Radius 12/12/2023 0.50  cm Final   MV M vel 12/12/2023 4.91  m/s Final   MV Peak grad 12/12/2023 96.2  mmHg Final   Est EF 12/12/2023 60 - 65%   Final  Office Visit on 12/11/2023  Component Date Value Ref Range Status   TSH 12/11/2023 2.170  0.450 - 4.500 uIU/mL Final   Sodium 12/11/2023 138  135 - 145 mEq/L Final   Potassium 12/11/2023 3.5  3.5 - 5.1 mEq/L Final   Chloride 12/11/2023 101  96 - 112 mEq/L Final  CO2 12/11/2023 29  19 - 32 mEq/L Final   Glucose, Bld 12/11/2023 89  70 - 99 mg/dL Final   BUN 95/78/7974 13  6 - 23 mg/dL Final   Creatinine, Ser 12/11/2023 0.77  0.40 - 1.20 mg/dL Final   Total Bilirubin 12/11/2023 0.5  0.2 - 1.2 mg/dL Final   Alkaline Phosphatase 12/11/2023 61  39 - 117 U/L Final   AST 12/11/2023 14  0 - 37 U/L Final   ALT 12/11/2023 17  0 - 35 U/L Final   Total Protein 12/11/2023 7.7  6.0 - 8.3 g/dL Final   Albumin 95/78/7974 4.1  3.5 - 5.2 g/dL Final   GFR 95/78/7974 92.46  >60.00 mL/min Final   Calcium  12/11/2023 9.2  8.4 - 10.5 mg/dL Final   Hgb J8r MFr Bld 12/11/2023 5.6  4.6 - 6.5 % Final   Cholesterol 12/11/2023 105  0 - 200 mg/dL Final   Triglycerides 95/78/7974 70.0  0.0 - 149.0 mg/dL Final   HDL 95/78/7974 33.70 (L)  >60.99 mg/dL Final   VLDL 95/78/7974 14.0  0.0 - 40.0 mg/dL Final   LDL Cholesterol 12/11/2023 58  0 - 99 mg/dL Final   Total CHOL/HDL Ratio 12/11/2023 3   Final    NonHDL 12/11/2023 71.58   Final   WBC 12/11/2023 12.7 (H)  4.0 - 10.5 K/uL Final   RBC 12/11/2023 4.96  3.87 - 5.11 Mil/uL Final   Hemoglobin 12/11/2023 13.4  12.0 - 15.0 g/dL Final   HCT 95/78/7974 40.9  36.0 - 46.0 % Final   MCV 12/11/2023 82.6  78.0 - 100.0 fl Final   MCHC 12/11/2023 32.8  30.0 - 36.0 g/dL Final   RDW 95/78/7974 14.8  11.5 - 15.5 % Final   Platelets 12/11/2023 399.0  150.0 - 400.0 K/uL Final   Neutrophils Relative % 12/11/2023 69.3  43.0 - 77.0 % Final   Lymphocytes Relative 12/11/2023 22.2  12.0 - 46.0 % Final   Monocytes Relative 12/11/2023 5.6  3.0 - 12.0 % Final   Eosinophils Relative 12/11/2023 2.5  0.0 - 5.0 % Final   Basophils Relative 12/11/2023 0.4  0.0 - 3.0 % Final   Neutro Abs 12/11/2023 8.8 (H)  1.4 - 7.7 K/uL Final   Lymphs Abs 12/11/2023 2.8  0.7 - 4.0 K/uL Final   Monocytes Absolute 12/11/2023 0.7  0.1 - 1.0 K/uL Final   Eosinophils Absolute 12/11/2023 0.3  0.0 - 0.7 K/uL Final   Basophils Absolute 12/11/2023 0.0  0.0 - 0.1 K/uL Final  Lab on 11/15/2023  Component Date Value Ref Range Status   Genetic Analysis Overall Interpret* 11/15/2023 Negative   Final   Genetic Disease Assessed 11/15/2023    Final                   Value:[facility] Population Screen is a screening test that analyzes 11 genes related to hereditary breast and ovarian cancer (HBOC) syndrome, Lynch syndrome, and familial hypercholesterolemia. This test only reports clinically significant pathogenic and  likely pathogenic variants but does not report variants of uncertain significance (VUS). In addition, analysis of the PMS2 gene excludes exons 11-15, which overlap with a known pseudogene (PMS2CL).    Genetic Analysis Report 11/15/2023    Final                   Value:No pathogenic or likely pathogenic variants were detected in the genes analyzed by this test.Genetic test results should be interpreted in the context of  an individual's personal medical and family history. Alteration  to medical management is NOT  recommended based solely on this result. Clinical correlation is advised.Additional Considerations- This is a screening test; individuals may still carry pathogenic or likely pathogenic variant(s) in the tested genes that are not detected by this test.-  For individuals at risk for these or other related conditions based on factors including personal or family history, diagnostic testing is recommended.- The absence of pathogenic or likely pathogenic variant(s) in the analyzed genes, while reassuring,  does not eliminate the possibility of a hereditary condition; there are other variants and genes associated with heart disease and hereditary cancer that are not included in this test.    Genes Tested 11/15/2023 See Notes   Final   Disclaimer 11/15/2023 See Notes   Final   Sequencing Location 11/15/2023 See Notes   Final   Interpretation Methods and Limitat* 11/15/2023 See Notes   Final  Appointment on 11/06/2023  Component Date Value Ref Range Status   S' Lateral 11/06/2023 3.00  cm Final   Area-P 1/2 11/06/2023 2.95  cm2 Final   Est EF 11/06/2023 65 - 70%   Final  Admission on 10/18/2023, Discharged on 10/18/2023  Component Date Value Ref Range Status   Lipase 10/18/2023 23  11 - 51 U/L Final   Sodium 10/18/2023 137  135 - 145 mmol/L Final   Potassium 10/18/2023 4.7  3.5 - 5.1 mmol/L Final   Chloride 10/18/2023 102  98 - 111 mmol/L Final   CO2 10/18/2023 28  22 - 32 mmol/L Final   Glucose, Bld 10/18/2023 83  70 - 99 mg/dL Final   BUN 97/73/7974 18  6 - 20 mg/dL Final   Creatinine, Ser 10/18/2023 0.80  0.44 - 1.00 mg/dL Final   Calcium  10/18/2023 10.3  8.9 - 10.3 mg/dL Final   Total Protein 97/73/7974 9.5 (H)  6.5 - 8.1 g/dL Final   Albumin 97/73/7974 4.7  3.5 - 5.0 g/dL Final   AST 97/73/7974 28  15 - 41 U/L Final   ALT 10/18/2023 17  0 - 44 U/L Final   Alkaline Phosphatase 10/18/2023 74  38 - 126 U/L Final   Total Bilirubin 10/18/2023 0.6  0.0 - 1.2 mg/dL  Final   GFR, Estimated 10/18/2023 >60  >60 mL/min Final   Anion gap 10/18/2023 7  5 - 15 Final   WBC 10/18/2023 13.8 (H)  4.0 - 10.5 K/uL Final   RBC 10/18/2023 5.78 (H)  3.87 - 5.11 MIL/uL Final   Hemoglobin 10/18/2023 15.7 (H)  12.0 - 15.0 g/dL Final   HCT 97/73/7974 47.8 (H)  36.0 - 46.0 % Final   MCV 10/18/2023 82.7  80.0 - 100.0 fL Final   MCH 10/18/2023 27.2  26.0 - 34.0 pg Final   MCHC 10/18/2023 32.8  30.0 - 36.0 g/dL Final   RDW 97/73/7974 13.9  11.5 - 15.5 % Final   Platelets 10/18/2023 431 (H)  150 - 400 K/uL Final   nRBC 10/18/2023 0.0  0.0 - 0.2 % Final   Color, Urine 10/18/2023 YELLOW  YELLOW Final   APPearance 10/18/2023 CLEAR  CLEAR Final   Specific Gravity, Urine 10/18/2023 1.015  1.005 - 1.030 Final   pH 10/18/2023 5.5  5.0 - 8.0 Final   Glucose, UA 10/18/2023 NEGATIVE  NEGATIVE mg/dL Final   Hgb urine dipstick 10/18/2023 NEGATIVE  NEGATIVE Final   Bilirubin Urine 10/18/2023 NEGATIVE  NEGATIVE Final   Ketones, ur 10/18/2023 NEGATIVE  NEGATIVE mg/dL Final  Protein, ur 10/18/2023 NEGATIVE  NEGATIVE mg/dL Final   Nitrite 97/73/7974 NEGATIVE  NEGATIVE Final   Leukocytes,Ua 10/18/2023 TRACE (A)  NEGATIVE Final   RBC / HPF 10/18/2023 0-5  0 - 5 RBC/hpf Final   WBC, UA 10/18/2023 0-5  0 - 5 WBC/hpf Final   Bacteria, UA 10/18/2023 MANY (A)  NONE SEEN Final   Squamous Epithelial / HPF 10/18/2023 0-5  0 - 5 /HPF Final   Mucus 10/18/2023 PRESENT   Final   Hyaline Casts, UA 10/18/2023 PRESENT   Final   SARS Coronavirus 2 by RT PCR 10/18/2023 NEGATIVE  NEGATIVE Final   Influenza A by PCR 10/18/2023 NEGATIVE  NEGATIVE Final   Influenza B by PCR 10/18/2023 NEGATIVE  NEGATIVE Final   Resp Syncytial Virus by PCR 10/18/2023 NEGATIVE  NEGATIVE Final  Admission on 08/07/2023, Discharged on 08/07/2023  Component Date Value Ref Range Status   Sodium 08/07/2023 137  135 - 145 mmol/L Final   Potassium 08/07/2023 3.6  3.5 - 5.1 mmol/L Final   Chloride 08/07/2023 100  98 - 111 mmol/L  Final   CO2 08/07/2023 31  22 - 32 mmol/L Final   Glucose, Bld 08/07/2023 86  70 - 99 mg/dL Final   BUN 87/83/7975 17  6 - 20 mg/dL Final   Creatinine, Ser 08/07/2023 0.83  0.44 - 1.00 mg/dL Final   Calcium  08/07/2023 9.7  8.9 - 10.3 mg/dL Final   GFR, Estimated 08/07/2023 >60  >60 mL/min Final   Anion gap 08/07/2023 6  5 - 15 Final   WBC 08/07/2023 10.8 (H)  4.0 - 10.5 K/uL Final   RBC 08/07/2023 4.64  3.87 - 5.11 MIL/uL Final   Hemoglobin 08/07/2023 12.8  12.0 - 15.0 g/dL Final   HCT 87/83/7975 38.8  36.0 - 46.0 % Final   MCV 08/07/2023 83.6  80.0 - 100.0 fL Final   MCH 08/07/2023 27.6  26.0 - 34.0 pg Final   MCHC 08/07/2023 33.0  30.0 - 36.0 g/dL Final   RDW 87/83/7975 14.0  11.5 - 15.5 % Final   Platelets 08/07/2023 299  150 - 400 K/uL Final   nRBC 08/07/2023 0.0  0.0 - 0.2 % Final   Troponin I (High Sensitivity) 08/07/2023 10  <18 ng/L Final   B Natriuretic Peptide 08/07/2023 168.1 (H)  0.0 - 100.0 pg/mL Final   D-Dimer, Quant 08/07/2023 1.45 (H)  0.00 - 0.50 ug/mL-FEU Final   Troponin I (High Sensitivity) 08/07/2023 9  <18 ng/L Final  Office Visit on 07/24/2023  Component Date Value Ref Range Status   Sodium 07/24/2023 135  135 - 145 mEq/L Final   Potassium 07/24/2023 4.1  3.5 - 5.1 mEq/L Final   Chloride 07/24/2023 100  96 - 112 mEq/L Final   CO2 07/24/2023 28  19 - 32 mEq/L Final   Glucose, Bld 07/24/2023 99  70 - 99 mg/dL Final   BUN 87/97/7975 18  6 - 23 mg/dL Final   Creatinine, Ser 07/24/2023 0.87  0.40 - 1.20 mg/dL Final   GFR 87/97/7975 80.07  >60.00 mL/min Final   Calcium  07/24/2023 9.5  8.4 - 10.5 mg/dL Final   Pro B Natriuretic peptide (BNP) 07/24/2023 120.0 (H)  0.0 - 100.0 pg/mL Final   SARS Coronavirus 2 Ag 07/24/2023 Negative  Negative Final  Admission on 07/07/2023, Discharged on 07/09/2023  Component Date Value Ref Range Status   Sodium 07/07/2023 137  135 - 145 mmol/L Final   Potassium 07/07/2023 3.7  3.5 - 5.1  mmol/L Final   Chloride 07/07/2023 102  98  - 111 mmol/L Final   CO2 07/07/2023 25  22 - 32 mmol/L Final   Glucose, Bld 07/07/2023 125 (H)  70 - 99 mg/dL Final   BUN 88/84/7975 20  6 - 20 mg/dL Final   Creatinine, Ser 07/07/2023 0.90  0.44 - 1.00 mg/dL Final   Calcium  07/07/2023 8.9  8.9 - 10.3 mg/dL Final   GFR, Estimated 07/07/2023 >60  >60 mL/min Final   Anion gap 07/07/2023 10  5 - 15 Final   WBC 07/07/2023 17.6 (H)  4.0 - 10.5 K/uL Final   RBC 07/07/2023 4.89  3.87 - 5.11 MIL/uL Final   Hemoglobin 07/07/2023 13.4  12.0 - 15.0 g/dL Final   HCT 88/84/7975 41.0  36.0 - 46.0 % Final   MCV 07/07/2023 83.8  80.0 - 100.0 fL Final   MCH 07/07/2023 27.4  26.0 - 34.0 pg Final   MCHC 07/07/2023 32.7  30.0 - 36.0 g/dL Final   RDW 88/84/7975 13.9  11.5 - 15.5 % Final   Platelets 07/07/2023 288  150 - 400 K/uL Final   nRBC 07/07/2023 0.0  0.0 - 0.2 % Final   Troponin I (High Sensitivity) 07/07/2023 45 (H)  <18 ng/L Final   B Natriuretic Peptide 07/07/2023 468.0 (H)  0.0 - 100.0 pg/mL Final   Troponin I (High Sensitivity) 07/07/2023 41 (H)  <18 ng/L Final   HIV Screen 4th Generation wRfx 07/07/2023 Non Reactive  Non Reactive Final   Weight 07/08/2023 3,566.16  oz Final   Height 07/08/2023 62  in Final   BP 07/08/2023 109/74  mmHg Final   S' Lateral 07/08/2023 2.40  cm Final   Area-P 1/2 07/08/2023 3.13  cm2 Final   Radius 07/08/2023 0.60  cm Final   MV M vel 07/08/2023 4.99  m/s Final   MV Peak grad 07/08/2023 99.6  mmHg Final   Est EF 07/08/2023 70 - 75%   Final   WBC 07/08/2023 13.7 (H)  4.0 - 10.5 K/uL Final   RBC 07/08/2023 4.76  3.87 - 5.11 MIL/uL Final   Hemoglobin 07/08/2023 12.9  12.0 - 15.0 g/dL Final   HCT 88/83/7975 39.1  36.0 - 46.0 % Final   MCV 07/08/2023 82.1  80.0 - 100.0 fL Final   MCH 07/08/2023 27.1  26.0 - 34.0 pg Final   MCHC 07/08/2023 33.0  30.0 - 36.0 g/dL Final   RDW 88/83/7975 13.4  11.5 - 15.5 % Final   Platelets 07/08/2023 283  150 - 400 K/uL Final   nRBC 07/08/2023 0.0  0.0 - 0.2 % Final    Neutrophils Relative % 07/08/2023 64  % Final   Neutro Abs 07/08/2023 8.6 (H)  1.7 - 7.7 K/uL Final   Lymphocytes Relative 07/08/2023 25  % Final   Lymphs Abs 07/08/2023 3.4  0.7 - 4.0 K/uL Final   Monocytes Relative 07/08/2023 8  % Final   Monocytes Absolute 07/08/2023 1.1 (H)  0.1 - 1.0 K/uL Final   Eosinophils Relative 07/08/2023 3  % Final   Eosinophils Absolute 07/08/2023 0.4  0.0 - 0.5 K/uL Final   Basophils Relative 07/08/2023 0  % Final   Basophils Absolute 07/08/2023 0.0  0.0 - 0.1 K/uL Final   Immature Granulocytes 07/08/2023 0  % Final   Abs Immature Granulocytes 07/08/2023 0.06  0.00 - 0.07 K/uL Final   Magnesium 07/08/2023 2.2  1.7 - 2.4 mg/dL Final   Phosphorus 88/83/7975 4.2  2.5 -  4.6 mg/dL Final   Sodium 88/83/7975 135  135 - 145 mmol/L Final   Potassium 07/08/2023 3.3 (L)  3.5 - 5.1 mmol/L Final   Chloride 07/08/2023 97 (L)  98 - 111 mmol/L Final   CO2 07/08/2023 28  22 - 32 mmol/L Final   Glucose, Bld 07/08/2023 109 (H)  70 - 99 mg/dL Final   BUN 88/83/7975 20  6 - 20 mg/dL Final   Creatinine, Ser 07/08/2023 0.84  0.44 - 1.00 mg/dL Final   Calcium  07/08/2023 9.3  8.9 - 10.3 mg/dL Final   Total Protein 88/83/7975 7.7  6.5 - 8.1 g/dL Final   Albumin 88/83/7975 3.6  3.5 - 5.0 g/dL Final   AST 88/83/7975 14 (L)  15 - 41 U/L Final   ALT 07/08/2023 16  0 - 44 U/L Final   Alkaline Phosphatase 07/08/2023 65  38 - 126 U/L Final   Total Bilirubin 07/08/2023 1.2 (H)  <1.2 mg/dL Final   GFR, Estimated 07/08/2023 >60  >60 mL/min Final   Anion gap 07/08/2023 10  5 - 15 Final   Sodium 07/09/2023 135  135 - 145 mmol/L Final   Potassium 07/09/2023 4.2  3.5 - 5.1 mmol/L Final   Chloride 07/09/2023 101  98 - 111 mmol/L Final   CO2 07/09/2023 27  22 - 32 mmol/L Final   Glucose, Bld 07/09/2023 111 (H)  70 - 99 mg/dL Final   BUN 88/82/7975 17  6 - 20 mg/dL Final   Creatinine, Ser 07/09/2023 0.89  0.44 - 1.00 mg/dL Final   Calcium  07/09/2023 9.2  8.9 - 10.3 mg/dL Final   GFR,  Estimated 07/09/2023 >60  >60 mL/min Final   Anion gap 07/09/2023 7  5 - 15 Final   Magnesium 07/09/2023 2.7 (H)  1.7 - 2.4 mg/dL Final  Scanned Document on 05/04/2023  Component Date Value Ref Range Status   HM Mammogram 05/03/2023 0-4 [facility]  0-4 [facility], Self Reported Normal Final  There may be more visits with results that are not included.  No image results found. ECHOCARDIOGRAM LIMITED Result Date: 01/02/2024    ECHOCARDIOGRAM LIMITED REPORT   Patient Name:   TRINITY HAUN Date of Exam: 01/02/2024 Medical Rec #:  [id number]     Height:       63.0 in Accession #:    [id number]    Weight:       223.8 lb Date of Birth:  [birth year]    BSA:          2.028 m Patient Age:    47 years      BP:           125/84 mmHg Patient Gender: F             HR:           67 bpm. Exam Location:  [facility] Procedure: Limited Echo, Limited Color Doppler, Cardiac Doppler, Strain Analysis            and 3D Echo (Both Spectral and Color Flow Doppler were utilized            during procedure). Indications:    HOCM I42.1; 2.5 MG Camzyos   History:        Patient has prior history of Echocardiogram examinations, most                 recent 12/12/2023. CHF; Risk Factors:Hypertension.  Sonographer:    Augustin Seals RDCS Referring Phys: [id number] [facility] IMPRESSIONS  1. Hypertrophic cardiomyopathy. No LVOT gradient at rest. 10mm LVOT gradient with Valsalva. Left ventricular ejection fraction, by estimation, is 70 to 75%. The left ventricle has hyperdynamic function. There is severe asymmetric left ventricular hypertrophy of the septal segment. Left ventricular diastolic parameters are consistent with Grade I diastolic dysfunction (impaired relaxation). The average left ventricular global longitudinal strain is -17.7 %. The global longitudinal strain is normal.  2. Right ventricular systolic function is normal.  3. Left atrial size was moderately dilated.  4. Minimal SAM. Mild mitral valve regurgitation.  5. The  aortic valve is tricuspid. There is mild calcification of the aortic valve. Aortic valve regurgitation is trivial. No aortic stenosis is present. FINDINGS  Left Ventricle: Hypertrophic cardiomyopathy. No LVOT gradient at rest. 10mm LVOT gradient with Valsalva. Left ventricular ejection fraction, by estimation, is 70 to 75%. The left ventricle has hyperdynamic function. The average left ventricular global longitudinal strain is -17.7 %. Strain was performed and the global longitudinal strain is normal. There is severe asymmetric left ventricular hypertrophy of the septal segment. Left ventricular diastolic parameters are consistent with Grade I diastolic dysfunction (impaired relaxation). Right Ventricle: Right ventricular systolic function is normal. Left Atrium: Left atrial size was moderately dilated. Right Atrium: Right atrial size was not well visualized. Pericardium: There is no evidence of pericardial effusion. Mitral Valve: Minimal SAM. Mild mitral valve regurgitation. Tricuspid Valve: The tricuspid valve is normal in structure. Tricuspid valve regurgitation is trivial. Aortic Valve: The aortic valve is tricuspid. There is mild calcification of the aortic valve. Aortic valve regurgitation is trivial. No aortic stenosis is present. Pulmonic Valve: The pulmonic valve was grossly normal. Venous: The inferior vena cava was not well visualized. LEFT VENTRICLE PLAX 2D LVIDd:         4.20 cm   Diastology LVIDs:         2.20 cm   LV e' medial:    4.91 cm/s LV PW:         1.30 cm   LV E/e' medial:  11.6 LV IVS:        1.40 cm   LV e' lateral:   11.70 cm/s LVOT diam:     2.10 cm   LV E/e' lateral: 4.9 LV SV:         103 LV SV Index:   51        2D Longitudinal Strain LVOT Area:     3.46 cm  2D Strain GLS Avg:     -17.7 %                           3D Volume EF:                          3D EF:        58 %                          LV EDV:       120 ml                          LV ESV:       50 ml                           LV SV:  70 ml LEFT ATRIUM         Index LA diam:    5.40 cm 2.66 cm/m  AORTIC VALVE LVOT Vmax:   137.00 cm/s LVOT Vmean:  93.300 cm/s LVOT VTI:    0.298 m  AORTA Ao Root diam: 2.80 cm MITRAL VALVE MV Area (PHT): 2.95 cm    SHUNTS MV Decel Time: 257 msec    Systemic VTI:  0.30 m MV E velocity: 57.10 cm/s  Systemic Diam: 2.10 cm MV A velocity: 78.10 cm/s MV E/A ratio:  0.73 Toribio Fuel MD Electronically signed by Toribio Fuel MD Signature Date/Time: 01/02/2024/5:47:07 PM    Final          ASSESSMENT & PLAN   Assessment & Plan Bereavement She is experiencing an acute grief reaction with frequent crying spells and significant emotional distress, raising concerns about stress-induced cardiomyopathy affecting her heart health. Increase lorazepam  (Ativan ) dosage to manage anxiety and reduce crying spells, with a discussion on sedation risks, noting it is safer for the liver than alcohol. Refer to psychology for counseling support, advising her to call the office for status if not contacted by tomorrow. Encourage minimizing stress to prevent exacerbation of her heart condition, discussing the risk of stress-induced cardiomyopathy and potential hospitalization. Morbid obesity (HCC) Currently on Wegovy  for weight management, with plans to increase the dosage to improve efficacy, aiming to reach 1 mg in a month. Increase Wegovy  dosage to 0.5 mg, planning to increase to 1 mg in a month, and monitor for potential side effects such as nausea. H/O TIA (transient ischemic attack) and stroke Currently on Wegovy  for weight management and transient ishemic attack (TIA) , with plans to increase the dosage to improve efficacy, aiming to reach 1 mg in a month. Increase Wegovy  dosage to 0.5 mg, planning to increase to 1 mg in a month, and monitor for potential side effects such as nausea. Chronic heart failure with preserved ejection fraction (HCC) She shows symptoms of fluid retention, likely worsened by  stress and emotional distress, with a risk of hospitalization due to fluid overload and potential stress-induced cardiomyopathy. Increase furosemide  (Lasix ) to 40 mg twice daily to manage fluid retention. Schedule a follow-up appointment next week to monitor fluid status and heart health. Advise on reducing salt intake to manage fluid retention and discuss the importance of balancing fluid intake to avoid dehydration while managing fluid retention.    ORDER ASSOCIATIONS  #   DIAGNOSIS / CONDITION ICD-10 ENCOUNTER ORDER     ICD-10-CM   1. Bereavement  Z63.4 LORazepam  (ATIVAN ) 1 MG tablet    Ambulatory referral to Psychology    2. Morbid obesity (HCC)  E66.01 Semaglutide -Weight Management (WEGOVY ) 1 MG/0.5ML SOAJ    3. H/O TIA (transient ischemic attack) and stroke  Z86.73 Semaglutide -Weight Management (WEGOVY ) 1 MG/0.5ML SOAJ     Meds ordered this encounter  Medications   LORazepam  (ATIVAN ) 1 MG tablet    Sig: Take 1 tablet (1 mg total) by mouth 2 (two) times daily as needed for anxiety.    Dispense:  20 tablet    Refill:  1   Semaglutide -Weight Management (WEGOVY ) 1 MG/0.5ML SOAJ    Sig: Inject 1 mg into the skin once a week.    Dispense:  2 mL    Refill:  2   furosemide  (LASIX ) 40 MG tablet    Sig: Take 1 tablet (40 mg total) by mouth daily.    Dispense:  30 tablet    Refill:  3  Orders Placed This Encounter  Procedures   Ambulatory referral to Psychology    Referral Priority:   Urgent    Referral Reason:   Specialty Services Required    Number of Visits Requested:   1        This document was synthesized by artificial intelligence (Abridge) using HIPAA-compliant recording of the clinical interaction;   We discussed the use of AI scribe software for clinical note transcription with the patient, who gave verbal consent to proceed. additional Info: This encounter employed state-of-the-art, real-time, collaborative documentation. The patient actively reviewed and assisted in  updating their electronic medical record on a shared screen, ensuring transparency and facilitating joint problem-solving for the problem list, overview, and plan. This approach promotes accurate, informed care. The treatment plan was discussed and reviewed in detail, including medication safety, potential side effects, and all patient questions. We confirmed understanding and comfort with the plan. Follow-up instructions were established, including contacting the office for any concerns, returning if symptoms worsen, persist, or new symptoms develop, and precautions for potential emergency department visits.

## 2024-03-21 NOTE — Assessment & Plan Note (Signed)
 Currently on Wegovy  for weight management and transient ishemic attack (TIA) , with plans to increase the dosage to improve efficacy, aiming to reach 1 mg in a month. Increase Wegovy  dosage to 0.5 mg, planning to increase to 1 mg in a month, and monitor for potential side effects such as nausea.

## 2024-03-21 NOTE — Patient Instructions (Signed)
 It was a pleasure seeing you today! Your health and satisfaction are our top priorities.  Lydia Cone, MD  VISIT SUMMARY: Today, we discussed your recent increased crying spells and fluid retention following the passing of your husband. We reviewed your current medications and made some adjustments to better manage your symptoms and overall health.  YOUR PLAN: -ACUTE GRIEF REACTION DUE TO DEATH OF SPOUSE: You are experiencing intense emotional distress and frequent crying spells due to the recent loss of your husband. We have increased your lorazepam  (Ativan ) dosage to help manage your anxiety and reduce crying spells. It is important to minimize stress to protect your heart health. We will also refer you to a psychologist for additional counseling support.  -CHRONIC HEART FAILURE WITH PRESERVED EJECTION FRACTION: Your heart failure is causing fluid retention, which is being worsened by stress and emotional distress. We have increased your furosemide  (Lasix ) dosage to 40 mg twice daily to help manage the fluid retention. Please reduce your salt intake and balance your fluid intake to avoid dehydration. We will monitor your fluid status and heart health closely.  -MORBID OBESITY: You are currently taking Wegovy  for weight management. We have decided to increase your dosage to 0.5 mg, with a plan to reach 1 mg in a month, to improve its effectiveness. We will monitor for any potential side effects such as nausea.  INSTRUCTIONS: Please call the office if you have not been contacted by tomorrow for your psychology referral. Schedule a follow-up appointment next week to monitor your fluid status and heart health.  Your Providers PCP: Thompson Lydia MATSU, MD,  [phone]) Referring Provider: Cone Lydia MATSU, MD,  [phone]) Care Team Provider: Elmira Newman PARAS, MD,  [phone]) Care Team Provider: Lee Dorthea Kay, MD,  [phone]) Care Team Provider: Skeet Juliene SAUNDERS, [place],   [phone]) Care Team Provider: Ladona Heinz, MD,  [phone]) Care Team Provider: Elmira Newman PARAS, MD,  [phone]) Care Team Provider: Maranda Lister D Care Team Provider: Carla Milling, RPH-CPP,  [phone])  NEXT STEPS: [x]  Early Intervention: Schedule sooner appointment, call our on-call services, or go to emergency room if there is any significant Increase in pain or discomfort New or worsening symptoms Sudden or severe changes in your health [x]  Flexible Follow-Up: We recommend a Return in about 1 week (around 03/28/2024). for optimal routine care. This allows for progress monitoring and treatment adjustments. [x]  Preventive Care: Schedule your annual preventive care visit! It's typically covered by insurance and helps identify potential health issues early. [x]  Lab & X-ray Appointments: Incomplete tests scheduled today, or call to schedule. X-rays: [facility] (M-F, 8:30am-noon or 1pm-5pm). [x]  Medical Information Release: Sign a release form at front desk to obtain relevant medical information we don't have.  MAKING THE MOST OF OUR FOCUSED 20 MINUTE APPOINTMENTS: [x]   Clearly state your top concerns at the beginning of the visit to focus our discussion [x]   If you anticipate you will need more time, please inform the front desk during scheduling - we can book multiple appointments in the same week. [x]   If you have transportation problems- use our convenient video appointments or ask about transportation support. [x]   We can get down to business faster if you use MyChart to update information before the visit and submit non-urgent questions before your visit. Thank you for taking the time to provide details through MyChart.  Let our nurse know and she can import this information into your encounter documents.  Arrival and Wait Times: [x]   Arriving on time ensures that everyone receives prompt attention. [x]   Early morning (8a) and afternoon (1p)  appointments tend to have shortest wait times. [x]   Unfortunately, we cannot delay appointments for late arrivals or hold slots during phone calls.  Getting Answers and Following Up [x]   Simple Questions & Concerns: For quick questions or basic follow-up after your visit, reach us  at [phone] or MyChart messaging. [x]   Complex Concerns: If your concern is more complex, scheduling an appointment might be best. Discuss this with the staff to find the most suitable option. [x]   Lab & Imaging Results: We'll contact you directly if results are abnormal or you don't use MyChart. Most normal results will be on MyChart within 2-3 business days, with a review message from Dr. Jesus. Haven't heard back in 2 weeks? Need results sooner? Contact us  at [phone]. [x]   Referrals: Our referral coordinator will manage specialist referrals. The specialist's office should contact you within 2 weeks to schedule an appointment. Call us  if you haven't heard from them after 2 weeks.  Staying Connected [x]   MyChart: Activate your MyChart for the fastest way to access results and message us . See the last page of this paperwork for instructions on how to activate.  Bring to Your Next Appointment [x]   Medications: Please bring all your medication bottles to your next appointment to ensure we have an accurate record of your prescriptions. [x]   Health Diaries: If you're monitoring any health conditions at home, keeping a diary of your readings can be very helpful for discussions at your next appointment.  Billing [x]   X-ray & Lab Orders: These are billed by separate companies. Contact the invoicing company directly for questions or concerns. [x]   Visit Charges: Discuss any billing inquiries with our administrative services team.  Your Satisfaction Matters [x]   Share Your Experience: We strive for your satisfaction! If you have any complaints, or preferably compliments, please let Dr. Jesus know directly or  contact our Practice Administrators, Manuelita Rubin or [employer], by asking at the front desk.   Reviewing Your Records [x]   Review this early draft of your clinical encounter notes below and the final encounter summary tomorrow on MyChart after its been completed.  All orders placed so far are visible here: Bereavement -     LORazepam ; Take 1 tablet (1 mg total) by mouth 2 (two) times daily as needed for anxiety.  Dispense: 20 tablet; Refill: 1 -     Ambulatory referral to Psychology  Morbid obesity (HCC) -     Wegovy ; Inject 1 mg into the skin once a week.  Dispense: 2 mL; Refill: 2  H/O TIA (transient ischemic attack) and stroke -     Wegovy ; Inject 1 mg into the skin once a week.  Dispense: 2 mL; Refill: 2  Chronic heart failure with preserved ejection fraction (HCC)  Other orders -     Furosemide ; Take 1 tablet (40 mg total) by mouth daily.  Dispense: 30 tablet; Refill: 3

## 2024-03-25 ENCOUNTER — Other Ambulatory Visit: Payer: Self-pay

## 2024-03-26 ENCOUNTER — Ambulatory Visit: Admitting: Internal Medicine

## 2024-03-27 ENCOUNTER — Other Ambulatory Visit: Payer: Self-pay

## 2024-04-04 ENCOUNTER — Other Ambulatory Visit: Payer: Self-pay

## 2024-04-09 ENCOUNTER — Other Ambulatory Visit: Payer: Self-pay

## 2024-04-11 ENCOUNTER — Other Ambulatory Visit: Payer: Self-pay

## 2024-04-11 ENCOUNTER — Encounter (HOSPITAL_BASED_OUTPATIENT_CLINIC_OR_DEPARTMENT_OTHER)

## 2024-04-11 ENCOUNTER — Encounter: Payer: Self-pay | Admitting: Pulmonary Disease

## 2024-04-11 ENCOUNTER — Ambulatory Visit (INDEPENDENT_AMBULATORY_CARE_PROVIDER_SITE_OTHER): Admitting: Pulmonary Disease

## 2024-04-11 DIAGNOSIS — R0609 Other forms of dyspnea: Secondary | ICD-10-CM | POA: Diagnosis not present

## 2024-04-11 NOTE — Progress Notes (Signed)
 @Patient  ID: Lydia Thompson, female    DOB: 12/23/1975, 48 y.o.   MRN: [id number]  Chief Complaint  Patient presents with   Follow-up    DOE Needs to get PFT rescheduled, advised pt to do so today when she leaves.  Pt is doing well since LOV.     Referring provider: Annella Donnice SAUNDERS, MD  HPI:   48 y.o. woman whom we are seeing for evaluation of dyspnea on exertion. Multiple cardiology notes reviewed.  Most recent PCP note reviewed.  Returns for routine follow-up.  Overall doing well.  Adherent to Symbicort .  Some interval improvement.  Also feels like her hokum medication is helping as well.  Unfortunate her husband is passed in the interim.  Dealing with grief as well as processing life without him.  Understand we missed PFTs.  Encouraged her to schedule at her earliest convenience for further evaluation.  The fact that she is improved with Symbicort  is very encouraging.  HPI initial visit: Patient has a history of HOCM.  Followed closely by cardiology.  I reviewed the results of 9 echocardiograms dating back to 2019 and as recently as yesterday, this reveals no evidence of pulmonary hypertension on any of the echocardiograms, no RA or RV enlargement, no RV dysfunction, no significant TR or elevation in PASP/RVSP.  She was recently started earlier this year on new medication for HOCM.  Her NYHA class heart failure symptoms have improved from III - II to I.  Her echocardiogram showed interval improvement as well.  Her dyspnea on exertion has subjectively improved on her report to me today.  Occasionally has to take rest with more strenuous things or long walks.  We discussed that likely cardiac cause is most her largest contributor to her symptoms.  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  We discussed this could be a sign of small airways disease.  She denies any history of asthma.  She does state that she was given inhaler in  the past, albuterol  and when she is used it she felt it helped.  She rarely uses it.  We discussed role and rationale for empiric inhaler for possible asthma.  We discussed role rationale for further testing, PFTs.  Questionaires / Pulmonary Flowsheets:   ACT:      No data to display          MMRC:     No data to display          Epworth:      No data to display          Tests:   FENO:  No results found for: NITRICOXIDE  PFT:     No data to display          WALK:      No data to display          Imaging: Personally reviewed and as per EMR and discussion in this note No results found.   Lab Results: Reviewed CBC    Component Value Date/Time   WBC 12.7 (H) 12/11/2023 0929   RBC 4.96 12/11/2023 0929   HGB 13.4 12/11/2023 0929   HGB 13.2 10/08/2020 1008   HCT 40.9 12/11/2023 0929   HCT 40.1 10/08/2020 1008   PLT 399.0 12/11/2023 0929   PLT 263 10/08/2020 1008   MCV 82.6 12/11/2023 0929   MCV 82 10/08/2020 1008   MCH 27.2 10/18/2023 1241   MCHC 32.8  12/11/2023 0929   RDW 14.8 12/11/2023 0929   RDW 13.2 10/08/2020 1008   LYMPHSABS 2.8 12/11/2023 0929   LYMPHSABS 3.1 10/08/2020 1008   MONOABS 0.7 12/11/2023 0929   EOSABS 0.3 12/11/2023 0929   EOSABS 0.4 10/08/2020 1008   BASOSABS 0.0 12/11/2023 0929   BASOSABS 0.0 10/08/2020 1008    BMET    Component Value Date/Time   NA 138 12/11/2023 0929   NA 140 10/08/2020 1008   K 3.5 12/11/2023 0929   CL 101 12/11/2023 0929   CO2 29 12/11/2023 0929   GLUCOSE 89 12/11/2023 0929   BUN 13 12/11/2023 0929   BUN 12 10/08/2020 1008   CREATININE 0.77 12/11/2023 0929   CREATININE 0.67 01/13/2015 1117   CALCIUM  9.2 12/11/2023 0929   GFRNONAA >60 10/18/2023 1241   GFRAA 93 10/08/2020 1008    BNP    Component Value Date/Time   BNP 168.1 (H) 08/07/2023 1231    ProBNP    Component Value Date/Time   PROBNP 120.0 (H) 07/24/2023 0845    Specialty Problems       Pulmonary Problems    Acute respiratory failure with hypoxia (HCC)   Hemoptysis    Allergies  Allergen Reactions   Gabapentin      Caused edema and weight gain    Immunization History  Administered Date(s) Administered   PNEUMOCOCCAL CONJUGATE-20 12/11/2023   Tdap 10/18/2017    Past Medical History:  Diagnosis Date   Anxiety    Chest pain of uncertain etiology 09/26/2017   CHF (congestive heart failure) (HCC)    Hot flashes, menopausal 08/30/2022   Tried OTC medications without help   Hypertension    Low libido 08/30/2022    Tobacco History: Social History   Tobacco Use  Smoking Status Never  Smokeless Tobacco Never   Counseling given: Not Answered   Continue to not smoke  Outpatient Encounter Medications as of 04/11/2024  Medication Sig   aspirin  EC 81 MG tablet Take 1 tablet (81 mg total) by mouth daily. Swallow whole.   atorvastatin  (LIPITOR) 40 MG tablet Take 1 tablet (40 mg total) by mouth daily.   budesonide -formoterol  (SYMBICORT ) 160-4.5 MCG/ACT inhaler Inhale 2 puffs into the lungs in the morning and at bedtime.   estradiol  (ESTRACE ) 0.1 MG/GM vaginal cream Limit use to minimum amount to control symptoms. (Patient taking differently: Place 1 Applicatorful vaginally as needed (dryness).)   Fezolinetant  (VEOZAH ) 45 MG TABS Take 1 tablet (45 mg total) by mouth daily at 6 (six) AM.   furosemide  (LASIX ) 40 MG tablet Take 1 tablet (40 mg total) by mouth daily.   LORazepam  (ATIVAN ) 1 MG tablet Take 1 tablet (1 mg total) by mouth 2 (two) times daily as needed for anxiety.   losartan  (COZAAR ) 25 MG tablet Take 0.5 tablets (12.5 mg total) by mouth daily.   mavacamten  (CAMZYOS ) 2.5 MG CAPS capsule Take 1 capsule (2.5 mg total) by mouth daily.   mavacamten  (CAMZYOS ) 2.5 MG CAPS capsule Take 1 capsule (2.5 mg total) by mouth daily.   metoprolol  succinate (TOPROL -XL) 100 MG 24 hr tablet Take 1.5 tablets (150 mg total) by mouth daily. Take with or immediately following a meal.   ondansetron   (ZOFRAN -ODT) 4 MG disintegrating tablet Take 1 tablet (4 mg total) by mouth every 8 (eight) hours as needed for nausea or vomiting.   semaglutide -weight management (WEGOVY ) 1 MG/0.5ML SOAJ SQ injection Inject 1 mg into the skin once a week.   spironolactone  (ALDACTONE ) 25 MG tablet Take  1 tablet (25 mg total) by mouth daily.   venlafaxine  XR (EFFEXOR  XR) 37.5 MG 24 hr capsule Take 1 capsule (37.5 mg total) by mouth daily with breakfast. Replaces duloxetine  due to recall   No facility-administered encounter medications on file as of 04/11/2024.     Review of Systems  Review of Systems  N/a Physical Exam  BP 115/83   Pulse 79   Temp 98.2 F (36.8 C)   Ht 5' 3 (1.6 m)   Wt 221 lb 9.6 oz (100.5 kg)   SpO2 99% Comment: RA  BMI 39.25 kg/m   Wt Readings from Last 5 Encounters:  04/11/24 221 lb 9.6 oz (100.5 kg)  03/21/24 225 lb (102.1 kg)  01/19/24 220 lb 9.6 oz (100.1 kg)  01/03/24 221 lb (100.2 kg)  12/11/23 223 lb 12.8 oz (101.5 kg)    BMI Readings from Last 5 Encounters:  04/11/24 39.25 kg/m  03/21/24 41.15 kg/m  01/19/24 40.35 kg/m  01/03/24 40.42 kg/m  12/11/23 39.64 kg/m     Physical Exam General: Sitting in chair, no acute distress Eyes: EOMI, no icterus Neck: Supple, no JVP Pulmonary: Clear, normal work of breathing Cardiovascular: Regular rate and rhythm, no murmur noted Abdomen: Nondistended MSK: No synovitis, no joint effusion Neuro: Normal gait, no weakness Psych: Normal mood, full affect  Assessment & Plan:   Dyspnea on exertion: Most likely driven by cardiac etiologies, HOCM in particular.  Marked improvement in symptoms with new medications for this.  NYHA symptom improvement as well as improvement on echocardiogram.  Most recent TTE shows no signs of pulmonary hypertension.  In fact dating back to 2019 on serial echocardiograms, there is no sign of pulmonary hypertension.  This is felt less likely.  If it were present most likely group 2 disease  given mitral valve regurgitation, HOCM, diastolic dysfunction, dilated left atrium.  It is also possible there is pulmonary contribution to her symptoms.  She denies a history of asthma. 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  This can indicate small airways disease (as well as pulmonary hypertension but again this is felt less likely).  Improvement in symptoms with use of albuterol  in the past.  Although she uses it rarely.  Further improvement with Symbicort  use.  Encouraged to continue this.  PFTs for further evaluation to be scheduled in the future.  Asthma: Clinical diagnosis based on improvement with albuterol  in the past with exercise.  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  Further improvement with Symbicort .  To continue.  Continue albuterol  as needed.   Return in about 6 months (around 10/12/2024) for f/u Dr. Annella.   Donnice JONELLE Annella, MD 04/11/2024

## 2024-04-15 ENCOUNTER — Other Ambulatory Visit: Payer: Self-pay

## 2024-04-16 ENCOUNTER — Other Ambulatory Visit: Payer: Self-pay

## 2024-04-17 NOTE — Progress Notes (Unsigned)
 Cardiology Office Note   Date:  04/18/2024  ID:  Lydia, Thompson 01-12-1976, MRN [id number] PCP: Jesus Bernardino MATSU, MD  [facility] Providers Cardiologist:  Newman JINNY Lawrence, MD   History of Present Illness Lydia Thompson is a 48 y.o. female with a history of hypertrophic cardiomyopathy who is currently on cardiac myosin inhibitors and is here for follow-up appointment.  Was last seen by Dr. Tellis in May and experiences significant improvement of her symptoms since starting cardiac myosin inhibitors particularly noting that she can now walk from the car to inside without being out of breath.  Currently asymptomatic and able to walk without difficulty.  Started on Wegovy  since her last visit and mentions weight fluctuations with her weight being 217 pounds when measured at home.  Notes that her clothing are starting to fit better in her stockings are going down indicating some weight loss.  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  She states that these activities along with her husband's illness contribute to her stress level.  She wants to increase her physical activity specifically mentioning desire to engage in chair exercises and swimming however, she does have some financial constraints and time limitations and these are barriers to access these activities.  Today, she presents with hypertrophic cardiomyopathy for a three-month follow-up.  She takes Mavacamten  2.5 mg daily and experiences no shortness of breath during usual activities. Her last echocardiogram showed normal left ventricular function. A CT scan in 2020 showed no coronary artery disease with a calcium  score of zero. Blood pressure and pulse rate are well-controlled with metoprolol  150 mg, losartan  12.5 mg, Lasix  40 mg daily, and spironolactone . Kidney function, potassium levels, thyroid function, and LDL were normal as of April.  She experiences increased  anxiety following her husband's passing, affecting her blood pressure, which occasionally reaches 140/100 mmHg. She attends therapy and has difficulty sleeping, likely due to anxiety and routine changes.  No shortness of breath, swelling in feet, lightheadedness, dizziness, or syncope.  Reports no shortness of breath nor dyspnea on exertion. Reports no chest pain, pressure, or tightness. No edema, orthopnea, PND. Reports no palpitations.   Discussed the use of AI scribe software for clinical note transcription with the patient, who gave verbal consent to proceed.   ROS: pertinent ROS in HPI  Studies Reviewed      ECHOCARDIOGRAM   ECHOCARDIOGRAM LIMITED 01/02/2024   Narrative ECHOCARDIOGRAM LIMITED REPORT       Patient Name:   Lydia Thompson Date of Exam: 01/02/2024 Medical Rec #:  [id number]     Height:       63.0 in Accession #:    [id number]    Weight:       223.8 lb Date of Birth:  04/23/1976    BSA:          2.028 m Patient Age:    47 years      BP:           125/84 mmHg Patient Gender: F             HR:           67 bpm. Exam Location:  [facility]   Procedure: Limited Echo, Limited Color Doppler, Cardiac Doppler, Strain Analysis and 3D Echo (Both Spectral and Color Flow Doppler were utilized during procedure).   Indications:    HOCM I42.1; 2.5 MG Camzyos    History:        Patient  has prior history of Echocardiogram examinations, most recent 12/12/2023. CHF; Risk Factors:Hypertension.   Sonographer:    Augustin Seals RDCS Referring Phys: [id number] [facility] CHANDRASEKHAR   IMPRESSIONS     1. Hypertrophic cardiomyopathy. No LVOT gradient at rest. 10mm LVOT gradient with Valsalva. Left ventricular ejection fraction, by estimation, is 70 to 75%. The left ventricle has hyperdynamic function. There is severe asymmetric left ventricular hypertrophy of the septal segment. Left ventricular diastolic parameters are consistent with Grade I diastolic dysfunction (impaired  relaxation). The average left ventricular global longitudinal strain is -17.7 %. The global longitudinal strain is normal. 2. Right ventricular systolic function is normal. 3. Left atrial size was moderately dilated. 4. Minimal SAM. Mild mitral valve regurgitation. 5. The aortic valve is tricuspid. There is mild calcification of the aortic valve. Aortic valve regurgitation is trivial. No aortic stenosis is present.   FINDINGS Left Ventricle: Hypertrophic cardiomyopathy. No LVOT gradient at rest. 10mm LVOT gradient with Valsalva. Left ventricular ejection fraction, by estimation, is 70 to 75%. The left ventricle has hyperdynamic function. The average left ventricular global longitudinal strain is -17.7 %. Strain was performed and the global longitudinal strain is normal. There is severe asymmetric left ventricular hypertrophy of the septal segment. Left ventricular diastolic parameters are consistent with Grade I diastolic dysfunction (impaired relaxation).   Right Ventricle: Right ventricular systolic function is normal.   Left Atrium: Left atrial size was moderately dilated.   Right Atrium: Right atrial size was not well visualized.   Pericardium: There is no evidence of pericardial effusion.   Mitral Valve: Minimal SAM. Mild mitral valve regurgitation.   Tricuspid Valve: The tricuspid valve is normal in structure. Tricuspid valve regurgitation is trivial.   Aortic Valve: The aortic valve is tricuspid. There is mild calcification of the aortic valve. Aortic valve regurgitation is trivial. No aortic stenosis is present.   Pulmonic Valve: The pulmonic valve was grossly normal.   Venous: The inferior vena cava was not well visualized.   LEFT VENTRICLE PLAX 2D LVIDd:         4.20 cm   Diastology LVIDs:         2.20 cm   LV e' medial:    4.91 cm/s LV PW:         1.30 cm   LV E/e' medial:  11.6 LV IVS:        1.40 cm   LV e' lateral:   11.70 cm/s LVOT diam:     2.10 cm   LV E/e'  lateral: 4.9 LV SV:         103 LV SV Index:   51        2D Longitudinal Strain LVOT Area:     3.46 cm  2D Strain GLS Avg:     -17.7 %   3D Volume EF: 3D EF:        58 % LV EDV:       120 ml LV ESV:       50 ml LV SV:        70 ml   LEFT ATRIUM         Index LA diam:    5.40 cm 2.66 cm/m AORTIC VALVE LVOT Vmax:   137.00 cm/s LVOT Vmean:  93.300 cm/s LVOT VTI:    0.298 m   AORTA Ao Root diam: 2.80 cm   MITRAL VALVE MV Area (PHT): 2.95 cm    SHUNTS MV Decel Time: 257 msec  Systemic VTI:  0.30 m MV E velocity: 57.10 cm/s  Systemic Diam: 2.10 cm MV A velocity: 78.10 cm/s MV E/A ratio:  0.73   Toribio Fuel MD Electronically signed by Toribio Fuel MD Signature Date/Time: 01/02/2024/5:47:07 PM       Final     MONITORS   LONG TERM MONITOR (3-14 DAYS) 08/30/2023   Narrative Zio patch monitor 13 days 08/03/2023 - 08/17/2023: Dominant rhythm: Sinus. HR 50-121 bpm. Avg HR 74 bpm, in sinus rhythm. 3 episodes of atrial tachycardia, fastest at 154 bpm for 5 beats, longest for 5 beats at 140 bpm. <1% isolated SVE, couplet/triplets. 1 episode of VT, fastest at 182 bpm for 5 bears. <1% isolated VE, couplets. No atrial fibrillation/atrial flutter/VT/high grade AV block, sinus pause >3sec noted. 14 patient triggered events, most correlated with SVE, VE.   CT SCANS   CT CORONARY MORPH W/CTA COR W/SCORE 10/17/2018   Addendum 10/17/2018 12:09 PM ADDENDUM REPORT: 10/17/2018 12:07   CLINICAL DATA:  48 year old female with h/o hypertension, obesity, chest pain and abnormal treadmill stress test.   EXAM: Cardiac/Coronary  CT   TECHNIQUE: The patient was scanned on a [employer].   FINDINGS: A 120 kV prospective scan was triggered in the descending thoracic aorta at 111 HU's. Axial non-contrast 3 mm slices were carried out through the heart. The data set was analyzed on a dedicated work station and scored using the Agatson method. Gantry rotation  speed was 250 msecs and collimation was .6 mm. No beta blockade and 0.8 mg of sl NTG was given. The 3D data set was reconstructed in 5% intervals of the 67-82 % of the R-R cycle. Diastolic phases were analyzed on a dedicated work station using MPR, MIP and VRT modes. The patient received 80 cc of contrast.   Aorta:  Normal size.  No calcifications.  No dissection.   Aortic Valve:  Trileaflet.  No calcifications.   Coronary Arteries:  Normal coronary origin.  Right dominance.   RCA is a large dominant artery that gives rise to PDA and PLVB. There is no plaque.   Left main is a large artery that gives rise to LAD and LCX arteries. Left main has no plaque.   LAD is a large vessel that wraps around the apex and has no plaque.   LCX is a non-dominant artery that gives rise to one large OM1 branch. There is no plaque.   Other findings:   Normal pulmonary vein drainage into the left atrium.   Normal let atrial appendage without a thrombus.   IMPRESSION: 1. Coronary calcium  score of 0. This was 0 percentile for age and sex matched control.   2. Normal coronary origin with right dominance.   3. No evidence of CAD.   4. Moderately dilated pulmonary artery measuring 36 mm suggestive of pulmonary hypertension.     Electronically Signed By: Leim Moose On: 10/17/2018 12:07   Narrative EXAM: OVER-READ INTERPRETATION  CT CHEST   The following report is an over-read performed by radiologist Dr. Marcey Moan of [facility] on 10/17/2018. This over-read does not include interpretation of cardiac or coronary anatomy or pathology. The coronary CTA interpretation by the cardiologist is attached.   COMPARISON:  None.   FINDINGS: Vascular: Dilated central pulmonary arteries with the main pulmonary artery segment measuring as much as 3.4 cm.   Mediastinum/Nodes: Visualized mediastinum and hilar regions show no evidence of lymphadenopathy or masses.    Lungs/Pleura: Minimal atelectasis at both lung  bases. Visualized lungs show no evidence of pulmonary edema, consolidation, pneumothorax, nodule or pleural fluid.   Upper Abdomen: Probable small hiatal hernia.   Musculoskeletal: No chest wall mass or suspicious bone lesions identified.   IMPRESSION: 1. Enlarged central pulmonary arteries are suggestive of underlying pulmonary hypertension. 2. Probable small hiatal hernia.   Electronically Signed: By: Marcey Moan M.D. On: 10/17/2018 09:40   CARDIAC MRI   MR CARDIAC MORPHOLOGY W WO CONTRAST 07/26/2023   Narrative CLINICAL DATA:  HCM   EXAM: CARDIAC MRI   TECHNIQUE: The patient was scanned on a 1.5 Tesla Siemens magnet. A dedicated cardiac coil was used. Functional imaging was done using Fiesta sequences. 2,3, and 4 chamber views were done to assess for RWMA's. Modified Simpson's rule using a short axis stack was used to calculate an ejection fraction on a dedicated work [employer]. The patient received 13 cc of Gadavist . After 10 minutes inversion recovery sequences were used to assess for infiltration and scar tissue. Velocity flow mapping performed in the ascending aorta and main pulmonary artery.   CONTRAST:  13 cc  of Gadavist    FINDINGS: 1. Normal left ventricular size, Severe asymmetric LV basal-septal wall thickness, measuring upto 1.8cm. Normal systolic function (LVEF = 57%).   There are no regional wall motion abnormalities.   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   There is no late gadolinium enhancement in the left ventricular myocardium.   LVEDV: 159 ml   LVESV: 69 ml   SV: 90 ml   CO: 5L/min   Myocardial mass: 145g   2. Normal right ventricular size, thickness and systolic function (RVEF = 54%). There are no regional wall motion abnormalities.   3.  Moderately dilated left atrial size, normal right atrial size.   4.  Normal size of the aortic root, ascending aorta and pulmonary artery.   5. Mild mitral regurgitation. no significant valvular abnormalities.   6.  Normal pericardium.  No pericardial effusion.   IMPRESSION: 1. Normal LV size and systolic function, LVEF = 57%.   2. Severe asymmetric LV basal-septal wall thickness, measuring upto 1.8 cm.   3. 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   4. There is no late gadolinium enhancement in the left ventricular myocardium.   5. Normal RV function.   6. 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     Electronically Signed By: Redell Cave M.D. On: 07/26/2023 18:17      Physical Exam VS:  BP 110/80   Pulse 89   Ht 5' 3 (1.6 m)   Wt 222 lb (100.7 kg)   SpO2 98%   BMI 39.33 kg/m        Wt Readings from Last 3 Encounters:  04/18/24 222 lb (100.7 kg)  04/11/24 221 lb 9.6 oz (100.5 kg)  03/21/24 225 lb (102.1 kg)    GEN: Well nourished, well developed in no acute distress NECK: No JVD; No carotid bruits CARDIAC: RRR, + systolic murmur, rubs, gallops RESPIRATORY:  Clear to auscultation without rales, wheezing or rhonchi  ABDOMEN: Soft, non-tender, non-distended EXTREMITIES:  No edema; No deformity   ASSESSMENT AND PLAN  Hypertrophic cardiomyopathy -Maximal thickness 18 mm, history of oHCM with LVOT gradient 55 mmHg -NYHA I on therapy -Echocardiogram due November 2025 -Low risk of sudden cardiac death with symptom improvable with cardiac myosin inhibitors allowing for increased physical activity without dyspnea.   -Outlined the importance  of exercise management and HCM and potential risks of heavy lifting  Essential hypertension Blood pressure well-controlled during visit, occasional high readings at home likely due to stress and anxiety. No peripheral edema. Kidney function and potassium levels normal in April. - Continue current antihypertensive  regimen. - Monitor blood pressure at home regularly. - Contact clinic if blood pressure remains elevated.  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 Attending therapy sessions regularly. - Continue attending therapy sessions. - Consult with Doctor Jesus regarding potential increase in Effexor  dosage.  Obesity -She  has been on Wegovy  with reported weight loss -Encouraged to continue low-sodium, heart healthy diet - Exercise prescription was provided at her last visit    Dispo: She can follow-up in November with Dr. Santo with echo.  Signed, Orren LOISE Fabry, PA-C

## 2024-04-18 ENCOUNTER — Encounter: Payer: Self-pay | Admitting: Physician Assistant

## 2024-04-18 ENCOUNTER — Ambulatory Visit: Attending: Cardiology | Admitting: Physician Assistant

## 2024-04-18 VITALS — BP 110/80 | HR 89 | Ht 63.0 in | Wt 222.0 lb

## 2024-04-18 DIAGNOSIS — I421 Obstructive hypertrophic cardiomyopathy: Secondary | ICD-10-CM

## 2024-04-18 DIAGNOSIS — I422 Other hypertrophic cardiomyopathy: Secondary | ICD-10-CM

## 2024-04-18 DIAGNOSIS — I10 Essential (primary) hypertension: Secondary | ICD-10-CM

## 2024-04-18 NOTE — Patient Instructions (Signed)
 Medication Instructions:  No medication changes were made at this visit. Continue current regimen.   *If you need a refill on your cardiac medications before your next appointment, please call your pharmacy*  Lab Work: None ordered today. If you have labs (blood work) drawn today and your tests are completely normal, you will receive your results only by: MyChart Message (if you have MyChart) OR A paper copy in the mail If you have any lab test that is abnormal or we need to change your treatment, we will call you to review the results.  Testing/Procedures: None ordered today.  Follow-Up: At [facility], you and your health needs are our priority.  As part of our continuing mission to provide you with exceptional heart care, our providers are all part of one team.  This team includes your primary Cardiologist (physician) and Advanced Practice Providers or APPs (Physician Assistants and Nurse Practitioners) who all work together to provide you with the care you need, when you need it.  Your next appointment:   November 2025  Provider:   Dr. Santo   We recommend signing up for the patient portal called MyChart.  Sign up information is provided on this After Visit Summary.  MyChart is used to connect with patients for Virtual Visits (Telemedicine).  Patients are able to view lab/test results, encounter notes, upcoming appointments, etc.  Non-urgent messages can be sent to your provider as well.   To learn more about what you can do with MyChart, go to [email or web address].   Other Instructions Continue to monitor blood pressure readings at home.

## 2024-04-19 ENCOUNTER — Encounter: Payer: Self-pay | Admitting: Internal Medicine

## 2024-04-19 ENCOUNTER — Other Ambulatory Visit: Payer: Self-pay

## 2024-04-19 ENCOUNTER — Ambulatory Visit (INDEPENDENT_AMBULATORY_CARE_PROVIDER_SITE_OTHER): Admitting: Internal Medicine

## 2024-04-19 VITALS — BP 126/76 | HR 68 | Temp 98.0°F | Ht 63.0 in | Wt 220.4 lb

## 2024-04-19 DIAGNOSIS — F419 Anxiety disorder, unspecified: Secondary | ICD-10-CM | POA: Diagnosis not present

## 2024-04-19 DIAGNOSIS — R232 Flushing: Secondary | ICD-10-CM

## 2024-04-19 DIAGNOSIS — F4321 Adjustment disorder with depressed mood: Secondary | ICD-10-CM | POA: Diagnosis not present

## 2024-04-19 DIAGNOSIS — F418 Other specified anxiety disorders: Secondary | ICD-10-CM

## 2024-04-19 DIAGNOSIS — Z634 Disappearance and death of family member: Secondary | ICD-10-CM | POA: Diagnosis not present

## 2024-04-19 MED ORDER — VENLAFAXINE HCL ER 75 MG PO CP24
75.0000 mg | ORAL_CAPSULE | Freq: Every day | ORAL | 3 refills | Status: AC
  Filled 2024-04-19: qty 90, 90d supply, fill #0
  Filled 2024-07-10: qty 90, 90d supply, fill #1

## 2024-04-19 MED ORDER — ALPRAZOLAM 1 MG PO TABS
1.0000 mg | ORAL_TABLET | Freq: Two times a day (BID) | ORAL | 0 refills | Status: DC | PRN
  Filled 2024-04-19: qty 20, 10d supply, fill #0

## 2024-04-19 MED ORDER — ESCITALOPRAM OXALATE 10 MG PO TABS
10.0000 mg | ORAL_TABLET | Freq: Every day | ORAL | 1 refills | Status: DC
  Filled 2024-04-19 – 2024-04-22 (×2): qty 30, 30d supply, fill #0
  Filled 2024-05-23: qty 30, 30d supply, fill #1

## 2024-04-19 NOTE — Progress Notes (Signed)
 ==============================  [facility]: [phone]   -- Medical Office Visit --  Patient: Lydia Thompson      Age: 48 y.o.       Sex:  female  Date:   04/19/2024 Today's Healthcare Provider: Bernardino KANDICE Cone, MD  ==============================   Chief Complaint: Anxiety   Discussed the use of AI scribe software for clinical note transcription with the patient, who gave verbal consent to proceed.  History of Present Illness 48 year old female who presents with increased anxiety and sleep disturbances following the loss of her husband.  She has been experiencing heightened anxiety and difficulty sleeping, which have worsened since her husband's passing. Her anxiety is significantly more intense than usual, particularly at night, making it difficult for her to fall asleep. Despite varying her bedtime, she consistently wakes up at 6 AM due to her routine and responsibilities.  She is currently working from 10 AM to 6 PM and manages her son's schedule, ensuring he gets on the school bus and goes to a friend's house after school. She finds her daily routine exhausting, even with help from others, and feels overwhelmed. Emotional breakdowns, such as crying, occur, which she tries to manage through self-care activities like spa visits and sensory isolation experiences.  She is taking her medications as prescribed and engages in self-care activities, including a recent spa visit involving sensory isolation in salt water, which she found relaxing. She plans to continue such activities to help manage her stress.  She has been prescribed lorazepam , which she picked up recently but found ineffective for her anxiety. She mentions a previous prescription of Ativan , which she did not find helpful either. She has been attending therapy sessions weekly, which she finds beneficial.  Her social support system includes her son and friends, who have been supportive,  although she acknowledges the limitations of relying too heavily on them. She is also dealing with legal issues regarding custody and financial support for her son, which adds to her stress.  She mentions a recent visit to a cardiologist and reports that she does not need to return until November. She is also dealing with hot flashes, for which she has been taking venlafaxine .  Background Reviewed: Problem List: has Essential hypertension; Hypertrophic cardiomyopathy (HCC); H/O TIA (transient ischemic attack) and stroke; Chronic heart failure with preserved ejection fraction (HCC); Morbid obesity (HCC); Post-menopause atrophic vaginitis; Dizziness; Pulmonary artery hypertension (HCC); Hot flashes, menopausal; Low libido; Nausea; Diastolic dysfunction; Mixed anxiety depressive disorder; Acute respiratory failure with hypoxia (HCC); SVT (supraventricular tachycardia) (HCC); Hypokalemia; Hemoptysis; Hot flashes; Cysts of both ovaries; Gastroesophageal reflux disease without esophagitis; and Bereavement on their problem list. Past Medical History:  has a past medical history of Anxiety, Chest pain of uncertain etiology (09/26/2017), CHF (congestive heart failure) (HCC), Hot flashes, menopausal (08/30/2022), Hypertension, and Low libido (08/30/2022). Past Surgical History:   has a past surgical history that includes Abdominal hysterectomy (08/23/2003). Social History:   reports that she has never smoked. She has never used smokeless tobacco. She reports current alcohol use. She reports that she does not use drugs. Family History:  family history includes Arthritis in her paternal uncle; Asthma in her paternal uncle; Cancer in her maternal grandmother; Cancer (age of onset: 54) in her mother; Heart disease in her paternal uncle; Hypertension in her mother and paternal uncle; Other in her sister. Allergies:  is allergic to gabapentin .   Medication Reconciliation: Current Outpatient Medications on File Prior to  Visit  Medication Sig   aspirin  EC 81 MG tablet Take 1 tablet (81 mg total) by mouth daily. Swallow whole.   atorvastatin  (LIPITOR) 40 MG tablet Take 1 tablet (40 mg total) by mouth daily.   estradiol  (ESTRACE ) 0.1 MG/GM vaginal cream Limit use to minimum amount to control symptoms.   furosemide  (LASIX ) 40 MG tablet Take 1 tablet (40 mg total) by mouth daily.   losartan  (COZAAR ) 25 MG tablet Take 0.5 tablets (12.5 mg total) by mouth daily.   metoprolol  succinate (TOPROL -XL) 100 MG 24 hr tablet Take 1.5 tablets (150 mg total) by mouth daily. Take with or immediately following a meal.   ondansetron  (ZOFRAN -ODT) 4 MG disintegrating tablet Take 1 tablet (4 mg total) by mouth every 8 (eight) hours as needed for nausea or vomiting.   semaglutide -weight management (WEGOVY ) 1 MG/0.5ML SOAJ SQ injection Inject 1 mg into the skin once a week.   spironolactone  (ALDACTONE ) 25 MG tablet Take 1 tablet (25 mg total) by mouth daily.   venlafaxine  XR (EFFEXOR  XR) 37.5 MG 24 hr capsule Take 1 capsule (37.5 mg total) by mouth daily with breakfast. Replaces duloxetine  due to recall   budesonide -formoterol  (SYMBICORT ) 160-4.5 MCG/ACT inhaler Inhale 2 puffs into the lungs in the morning and at bedtime. (Patient taking differently: Inhale 2 puffs into the lungs as needed.)   Fezolinetant  (VEOZAH ) 45 MG TABS Take 1 tablet (45 mg total) by mouth daily at 6 (six) AM.   mavacamten  (CAMZYOS ) 2.5 MG CAPS capsule Take 1 capsule (2.5 mg total) by mouth daily.   No current facility-administered medications on file prior to visit.   Medications Discontinued During This Encounter  Medication Reason   LORazepam  (ATIVAN ) 1 MG tablet Completed Course     Physical Exam:    04/19/2024    9:52 AM 04/18/2024    1:18 PM 04/11/2024   11:21 AM  Vitals with BMI  Height 5' 3 5' 3 5' 3  Weight 220 lbs 6 oz 222 lbs 221 lbs 10 oz  BMI 39.05 39.34 39.26  Systolic 126 110 884  Diastolic 76 80 83  Pulse 68 89 79  Vital signs  reviewed.  Nursing notes reviewed. Weight trend reviewed. Physical Activity: Insufficiently Active (03/17/2024)   Exercise Vital Sign    Days of Exercise per Week: 2 days    Minutes of Exercise per Session: 30 min   General Appearance:  No acute distress appreciable.   Well-groomed, healthy-appearing female.  Well proportioned with no abnormal fat distribution.  Good muscle tone. Pulmonary:  Normal work of breathing at rest, no respiratory distress apparent. SpO2: 98 %  Musculoskeletal: All extremities are intact.  Neurological:  Awake, alert, oriented, and engaged.  No obvious focal neurological deficits or cognitive impairments.  Sensorium seems unclouded.   Speech is clear and coherent with logical content. Psychiatric:  Appropriate mood, pleasant and cooperative demeanor, thoughtful and engaged during the exam   Verbalized to patient: Physical Exam    Results:    12/11/2023    8:31 AM 09/26/2023    1:41 PM 09/06/2023    8:38 AM 09/04/2023    9:40 AM  PHQ 2/9 Scores  PHQ - 2 Score 0 0 0 0  PHQ- 9 Score 0  3     Verbalized to patient: Results DIAGNOSTIC EKG: QTc 416 ms    Appointment on 01/02/2024  Component Date Value Ref Range Status   S' Lateral 01/02/2024 2.20  cm Final   Area-P 1/2 01/02/2024 2.95  cm2 Final   Est EF 01/02/2024 70 - 75%   Final  Appointment on 12/12/2023  Component Date Value Ref Range Status   Area-P 1/2 12/12/2023 3.40  cm2 Final   S' Lateral 12/12/2023 2.40  cm Final   Radius 12/12/2023 0.50  cm Final   MV M vel 12/12/2023 4.91  m/s Final   MV Peak grad 12/12/2023 96.2  mmHg Final   Est EF 12/12/2023 60 - 65%   Final  Office Visit on 12/11/2023  Component Date Value Ref Range Status   TSH 12/11/2023 2.170  0.450 - 4.500 uIU/mL Final   Sodium 12/11/2023 138  135 - 145 mEq/L Final   Potassium 12/11/2023 3.5  3.5 - 5.1 mEq/L Final   Chloride 12/11/2023 101  96 - 112 mEq/L Final   CO2 12/11/2023 29  19 - 32 mEq/L Final   Glucose, Bld  12/11/2023 89  70 - 99 mg/dL Final   BUN 95/78/7974 13  6 - 23 mg/dL Final   Creatinine, Ser 12/11/2023 0.77  0.40 - 1.20 mg/dL Final   Total Bilirubin 12/11/2023 0.5  0.2 - 1.2 mg/dL Final   Alkaline Phosphatase 12/11/2023 61  39 - 117 U/L Final   AST 12/11/2023 14  0 - 37 U/L Final   ALT 12/11/2023 17  0 - 35 U/L Final   Total Protein 12/11/2023 7.7  6.0 - 8.3 g/dL Final   Albumin 95/78/7974 4.1  3.5 - 5.2 g/dL Final   GFR 95/78/7974 92.46  >60.00 mL/min Final   Calcium  12/11/2023 9.2  8.4 - 10.5 mg/dL Final   Hgb J8r MFr Bld 12/11/2023 5.6  4.6 - 6.5 % Final   Cholesterol 12/11/2023 105  0 - 200 mg/dL Final   Triglycerides 95/78/7974 70.0  0.0 - 149.0 mg/dL Final   HDL 95/78/7974 33.70 (L)  >60.99 mg/dL Final   VLDL 95/78/7974 14.0  0.0 - 40.0 mg/dL Final   LDL Cholesterol 12/11/2023 58  0 - 99 mg/dL Final   Total CHOL/HDL Ratio 12/11/2023 3   Final   NonHDL 12/11/2023 71.58   Final   WBC 12/11/2023 12.7 (H)  4.0 - 10.5 K/uL Final   RBC 12/11/2023 4.96  3.87 - 5.11 Mil/uL Final   Hemoglobin 12/11/2023 13.4  12.0 - 15.0 g/dL Final   HCT 95/78/7974 40.9  36.0 - 46.0 % Final   MCV 12/11/2023 82.6  78.0 - 100.0 fl Final   MCHC 12/11/2023 32.8  30.0 - 36.0 g/dL Final   RDW 95/78/7974 14.8  11.5 - 15.5 % Final   Platelets 12/11/2023 399.0  150.0 - 400.0 K/uL Final   Neutrophils Relative % 12/11/2023 69.3  43.0 - 77.0 % Final   Lymphocytes Relative 12/11/2023 22.2  12.0 - 46.0 % Final   Monocytes Relative 12/11/2023 5.6  3.0 - 12.0 % Final   Eosinophils Relative 12/11/2023 2.5  0.0 - 5.0 % Final   Basophils Relative 12/11/2023 0.4  0.0 - 3.0 % Final   Neutro Abs 12/11/2023 8.8 (H)  1.4 - 7.7 K/uL Final   Lymphs Abs 12/11/2023 2.8  0.7 - 4.0 K/uL Final   Monocytes Absolute 12/11/2023 0.7  0.1 - 1.0 K/uL Final   Eosinophils Absolute 12/11/2023 0.3  0.0 - 0.7 K/uL Final   Basophils Absolute 12/11/2023 0.0  0.0 - 0.1 K/uL Final  Lab on 11/15/2023  Component Date Value Ref Range Status    Genetic Analysis Overall Interpret* 11/15/2023 Negative   Final   Genetic Disease Assessed 11/15/2023  Final                   Value:[facility] Population Screen is a screening test that analyzes 11 genes related to hereditary breast and ovarian cancer (HBOC) syndrome, Lynch syndrome, and familial hypercholesterolemia. This test only reports clinically significant pathogenic and  likely pathogenic variants but does not report variants of uncertain significance (VUS). In addition, analysis of the PMS2 gene excludes exons 11-15, which overlap with a known pseudogene (PMS2CL).    Genetic Analysis Report 11/15/2023    Final                   Value:No pathogenic or likely pathogenic variants were detected in the genes analyzed by this test.Genetic test results should be interpreted in the context of an individual's personal medical and family history. Alteration to medical management is NOT  recommended based solely on this result. Clinical correlation is advised.Additional Considerations- This is a screening test; individuals may still carry pathogenic or likely pathogenic variant(s) in the tested genes that are not detected by this test.-  For individuals at risk for these or other related conditions based on factors including personal or family history, diagnostic testing is recommended.- The absence of pathogenic or likely pathogenic variant(s) in the analyzed genes, while reassuring,  does not eliminate the possibility of a hereditary condition; there are other variants and genes associated with heart disease and hereditary cancer that are not included in this test.    Genes Tested 11/15/2023 See Notes   Final   Disclaimer 11/15/2023 See Notes   Final   Sequencing Location 11/15/2023 See Notes   Final   Interpretation Methods and Limitat* 11/15/2023 See Notes   Final  Appointment on 11/06/2023  Component Date Value Ref Range Status   S' Lateral 11/06/2023 3.00  cm Final   Area-P 1/2  11/06/2023 2.95  cm2 Final   Est EF 11/06/2023 65 - 70%   Final  Admission on 10/18/2023, Discharged on 10/18/2023  Component Date Value Ref Range Status   Lipase 10/18/2023 23  11 - 51 U/L Final   Sodium 10/18/2023 137  135 - 145 mmol/L Final   Potassium 10/18/2023 4.7  3.5 - 5.1 mmol/L Final   Chloride 10/18/2023 102  98 - 111 mmol/L Final   CO2 10/18/2023 28  22 - 32 mmol/L Final   Glucose, Bld 10/18/2023 83  70 - 99 mg/dL Final   BUN 97/73/7974 18  6 - 20 mg/dL Final   Creatinine, Ser 10/18/2023 0.80  0.44 - 1.00 mg/dL Final   Calcium  10/18/2023 10.3  8.9 - 10.3 mg/dL Final   Total Protein 97/73/7974 9.5 (H)  6.5 - 8.1 g/dL Final   Albumin 97/73/7974 4.7  3.5 - 5.0 g/dL Final   AST 97/73/7974 28  15 - 41 U/L Final   ALT 10/18/2023 17  0 - 44 U/L Final   Alkaline Phosphatase 10/18/2023 74  38 - 126 U/L Final   Total Bilirubin 10/18/2023 0.6  0.0 - 1.2 mg/dL Final   GFR, Estimated 10/18/2023 >60  >60 mL/min Final   Anion gap 10/18/2023 7  5 - 15 Final   WBC 10/18/2023 13.8 (H)  4.0 - 10.5 K/uL Final   RBC 10/18/2023 5.78 (H)  3.87 - 5.11 MIL/uL Final   Hemoglobin 10/18/2023 15.7 (H)  12.0 - 15.0 g/dL Final   HCT 97/73/7974 47.8 (H)  36.0 - 46.0 % Final   MCV 10/18/2023 82.7  80.0 - 100.0  fL Final   MCH 10/18/2023 27.2  26.0 - 34.0 pg Final   MCHC 10/18/2023 32.8  30.0 - 36.0 g/dL Final   RDW 97/73/7974 13.9  11.5 - 15.5 % Final   Platelets 10/18/2023 431 (H)  150 - 400 K/uL Final   nRBC 10/18/2023 0.0  0.0 - 0.2 % Final   Color, Urine 10/18/2023 YELLOW  YELLOW Final   APPearance 10/18/2023 CLEAR  CLEAR Final   Specific Gravity, Urine 10/18/2023 1.015  1.005 - 1.030 Final   pH 10/18/2023 5.5  5.0 - 8.0 Final   Glucose, UA 10/18/2023 NEGATIVE  NEGATIVE mg/dL Final   Hgb urine dipstick 10/18/2023 NEGATIVE  NEGATIVE Final   Bilirubin Urine 10/18/2023 NEGATIVE  NEGATIVE Final   Ketones, ur 10/18/2023 NEGATIVE  NEGATIVE mg/dL Final   Protein, ur 97/73/7974 NEGATIVE  NEGATIVE mg/dL  Final   Nitrite 97/73/7974 NEGATIVE  NEGATIVE Final   Leukocytes,Ua 10/18/2023 TRACE (A)  NEGATIVE Final   RBC / HPF 10/18/2023 0-5  0 - 5 RBC/hpf Final   WBC, UA 10/18/2023 0-5  0 - 5 WBC/hpf Final   Bacteria, UA 10/18/2023 MANY (A)  NONE SEEN Final   Squamous Epithelial / HPF 10/18/2023 0-5  0 - 5 /HPF Final   Mucus 10/18/2023 PRESENT   Final   Hyaline Casts, UA 10/18/2023 PRESENT   Final   SARS Coronavirus 2 by RT PCR 10/18/2023 NEGATIVE  NEGATIVE Final   Influenza A by PCR 10/18/2023 NEGATIVE  NEGATIVE Final   Influenza B by PCR 10/18/2023 NEGATIVE  NEGATIVE Final   Resp Syncytial Virus by PCR 10/18/2023 NEGATIVE  NEGATIVE Final  Admission on 08/07/2023, Discharged on 08/07/2023  Component Date Value Ref Range Status   Sodium 08/07/2023 137  135 - 145 mmol/L Final   Potassium 08/07/2023 3.6  3.5 - 5.1 mmol/L Final   Chloride 08/07/2023 100  98 - 111 mmol/L Final   CO2 08/07/2023 31  22 - 32 mmol/L Final   Glucose, Bld 08/07/2023 86  70 - 99 mg/dL Final   BUN 87/83/7975 17  6 - 20 mg/dL Final   Creatinine, Ser 08/07/2023 0.83  0.44 - 1.00 mg/dL Final   Calcium  08/07/2023 9.7  8.9 - 10.3 mg/dL Final   GFR, Estimated 08/07/2023 >60  >60 mL/min Final   Anion gap 08/07/2023 6  5 - 15 Final   WBC 08/07/2023 10.8 (H)  4.0 - 10.5 K/uL Final   RBC 08/07/2023 4.64  3.87 - 5.11 MIL/uL Final   Hemoglobin 08/07/2023 12.8  12.0 - 15.0 g/dL Final   HCT 87/83/7975 38.8  36.0 - 46.0 % Final   MCV 08/07/2023 83.6  80.0 - 100.0 fL Final   MCH 08/07/2023 27.6  26.0 - 34.0 pg Final   MCHC 08/07/2023 33.0  30.0 - 36.0 g/dL Final   RDW 87/83/7975 14.0  11.5 - 15.5 % Final   Platelets 08/07/2023 299  150 - 400 K/uL Final   nRBC 08/07/2023 0.0  0.0 - 0.2 % Final   Troponin I (High Sensitivity) 08/07/2023 10  <18 ng/L Final   B Natriuretic Peptide 08/07/2023 168.1 (H)  0.0 - 100.0 pg/mL Final   D-Dimer, Quant 08/07/2023 1.45 (H)  0.00 - 0.50 ug/mL-FEU Final   Troponin I (High Sensitivity) 08/07/2023  9  <18 ng/L Final  Office Visit on 07/24/2023  Component Date Value Ref Range Status   Sodium 07/24/2023 135  135 - 145 mEq/L Final   Potassium 07/24/2023 4.1  3.5 - 5.1 mEq/L  Final   Chloride 07/24/2023 100  96 - 112 mEq/L Final   CO2 07/24/2023 28  19 - 32 mEq/L Final   Glucose, Bld 07/24/2023 99  70 - 99 mg/dL Final   BUN 87/97/7975 18  6 - 23 mg/dL Final   Creatinine, Ser 07/24/2023 0.87  0.40 - 1.20 mg/dL Final   GFR 87/97/7975 80.07  >60.00 mL/min Final   Calcium  07/24/2023 9.5  8.4 - 10.5 mg/dL Final   Pro B Natriuretic peptide (BNP) 07/24/2023 120.0 (H)  0.0 - 100.0 pg/mL Final   SARS Coronavirus 2 Ag 07/24/2023 Negative  Negative Final  Admission on 07/07/2023, Discharged on 07/09/2023  Component Date Value Ref Range Status   Sodium 07/07/2023 137  135 - 145 mmol/L Final   Potassium 07/07/2023 3.7  3.5 - 5.1 mmol/L Final   Chloride 07/07/2023 102  98 - 111 mmol/L Final   CO2 07/07/2023 25  22 - 32 mmol/L Final   Glucose, Bld 07/07/2023 125 (H)  70 - 99 mg/dL Final   BUN 88/84/7975 20  6 - 20 mg/dL Final   Creatinine, Ser 07/07/2023 0.90  0.44 - 1.00 mg/dL Final   Calcium  07/07/2023 8.9  8.9 - 10.3 mg/dL Final   GFR, Estimated 07/07/2023 >60  >60 mL/min Final   Anion gap 07/07/2023 10  5 - 15 Final   WBC 07/07/2023 17.6 (H)  4.0 - 10.5 K/uL Final   RBC 07/07/2023 4.89  3.87 - 5.11 MIL/uL Final   Hemoglobin 07/07/2023 13.4  12.0 - 15.0 g/dL Final   HCT 88/84/7975 41.0  36.0 - 46.0 % Final   MCV 07/07/2023 83.8  80.0 - 100.0 fL Final   MCH 07/07/2023 27.4  26.0 - 34.0 pg Final   MCHC 07/07/2023 32.7  30.0 - 36.0 g/dL Final   RDW 88/84/7975 13.9  11.5 - 15.5 % Final   Platelets 07/07/2023 288  150 - 400 K/uL Final   nRBC 07/07/2023 0.0  0.0 - 0.2 % Final   Troponin I (High Sensitivity) 07/07/2023 45 (H)  <18 ng/L Final   B Natriuretic Peptide 07/07/2023 468.0 (H)  0.0 - 100.0 pg/mL Final   Troponin I (High Sensitivity) 07/07/2023 41 (H)  <18 ng/L Final   HIV Screen 4th  Generation wRfx 07/07/2023 Non Reactive  Non Reactive Final   Weight 07/08/2023 3,566.16  oz Final   Height 07/08/2023 62  in Final   BP 07/08/2023 109/74  mmHg Final   S' Lateral 07/08/2023 2.40  cm Final   Area-P 1/2 07/08/2023 3.13  cm2 Final   Radius 07/08/2023 0.60  cm Final   MV M vel 07/08/2023 4.99  m/s Final   MV Peak grad 07/08/2023 99.6  mmHg Final   Est EF 07/08/2023 70 - 75%   Final   WBC 07/08/2023 13.7 (H)  4.0 - 10.5 K/uL Final   RBC 07/08/2023 4.76  3.87 - 5.11 MIL/uL Final   Hemoglobin 07/08/2023 12.9  12.0 - 15.0 g/dL Final   HCT 88/83/7975 39.1  36.0 - 46.0 % Final   MCV 07/08/2023 82.1  80.0 - 100.0 fL Final   MCH 07/08/2023 27.1  26.0 - 34.0 pg Final   MCHC 07/08/2023 33.0  30.0 - 36.0 g/dL Final   RDW 88/83/7975 13.4  11.5 - 15.5 % Final   Platelets 07/08/2023 283  150 - 400 K/uL Final   nRBC 07/08/2023 0.0  0.0 - 0.2 % Final   Neutrophils Relative % 07/08/2023 64  % Final  Neutro Abs 07/08/2023 8.6 (H)  1.7 - 7.7 K/uL Final   Lymphocytes Relative 07/08/2023 25  % Final   Lymphs Abs 07/08/2023 3.4  0.7 - 4.0 K/uL Final   Monocytes Relative 07/08/2023 8  % Final   Monocytes Absolute 07/08/2023 1.1 (H)  0.1 - 1.0 K/uL Final   Eosinophils Relative 07/08/2023 3  % Final   Eosinophils Absolute 07/08/2023 0.4  0.0 - 0.5 K/uL Final   Basophils Relative 07/08/2023 0  % Final   Basophils Absolute 07/08/2023 0.0  0.0 - 0.1 K/uL Final   Immature Granulocytes 07/08/2023 0  % Final   Abs Immature Granulocytes 07/08/2023 0.06  0.00 - 0.07 K/uL Final   Magnesium 07/08/2023 2.2  1.7 - 2.4 mg/dL Final   Phosphorus 88/83/7975 4.2  2.5 - 4.6 mg/dL Final   Sodium 88/83/7975 135  135 - 145 mmol/L Final   Potassium 07/08/2023 3.3 (L)  3.5 - 5.1 mmol/L Final   Chloride 07/08/2023 97 (L)  98 - 111 mmol/L Final   CO2 07/08/2023 28  22 - 32 mmol/L Final   Glucose, Bld 07/08/2023 109 (H)  70 - 99 mg/dL Final   BUN 88/83/7975 20  6 - 20 mg/dL Final   Creatinine, Ser 07/08/2023  0.84  0.44 - 1.00 mg/dL Final   Calcium  07/08/2023 9.3  8.9 - 10.3 mg/dL Final   Total Protein 88/83/7975 7.7  6.5 - 8.1 g/dL Final   Albumin 88/83/7975 3.6  3.5 - 5.0 g/dL Final   AST 88/83/7975 14 (L)  15 - 41 U/L Final   ALT 07/08/2023 16  0 - 44 U/L Final   Alkaline Phosphatase 07/08/2023 65  38 - 126 U/L Final   Total Bilirubin 07/08/2023 1.2 (H)  <1.2 mg/dL Final   GFR, Estimated 07/08/2023 >60  >60 mL/min Final   Anion gap 07/08/2023 10  5 - 15 Final   Sodium 07/09/2023 135  135 - 145 mmol/L Final   Potassium 07/09/2023 4.2  3.5 - 5.1 mmol/L Final   Chloride 07/09/2023 101  98 - 111 mmol/L Final   CO2 07/09/2023 27  22 - 32 mmol/L Final   Glucose, Bld 07/09/2023 111 (H)  70 - 99 mg/dL Final   BUN 88/82/7975 17  6 - 20 mg/dL Final   Creatinine, Ser 07/09/2023 0.89  0.44 - 1.00 mg/dL Final   Calcium  07/09/2023 9.2  8.9 - 10.3 mg/dL Final   GFR, Estimated 07/09/2023 >60  >60 mL/min Final   Anion gap 07/09/2023 7  5 - 15 Final   Magnesium 07/09/2023 2.7 (H)  1.7 - 2.4 mg/dL Final  Scanned Document on 05/04/2023  Component Date Value Ref Range Status   HM Mammogram 05/03/2023 0-4 [facility]  0-4 [facility], Self Reported Normal Final  There may be more visits with results that are not included.  No image results found. No results found.       ASSESSMENT & PLAN   Assessment & Plan Mixed anxiety depressive disorder Grief Anxiety disorder, unspecified type Bereavement Adjustment disorder with mixed anxiety and depressed mood and bereavement due to loss of spouse   She experiences significant anxiety and difficulty sleeping after losing her spouse. Currently, she takes venlafaxine  for anxiety and vasomotor symptoms. Lorazepam  was ineffective. Switch to Xanax  for acute anxiety episodes, with a discussion on dependency risks and the importance of non-pharmacological interventions. Refer to psychiatry for further management and potential antipsychotic prescription for work-related anxiety.  Emphasize avoiding driving or childcare after taking Xanax  due  to its sedative effects. Encourage continuation of self-care activities and social support. Consider Lexapro  if venlafaxine  is not effective or tolerated.  Vasomotor symptoms (hot flashes)   She experiences hot flashes managed with venlafaxine . Increase the venlafaxine  dose to better manage symptoms. Monitor for side effects and effectiveness of the increased dose. Hot flashes   {Assessment and Plan Assessment & Plan   Recording duration: 25 minutes   ORDER ASSOCIATIONS  #   DIAGNOSIS / CONDITION ICD-10 ENCOUNTER ORDER     ICD-10-CM   1. Mixed anxiety depressive disorder  F41.8 Ambulatory referral to Psychiatry    escitalopram  (LEXAPRO ) 10 MG tablet    2. Grief  F43.21 ALPRAZolam  (XANAX ) 1 MG tablet    Ambulatory referral to Psychiatry    escitalopram  (LEXAPRO ) 10 MG tablet    3. Bereavement  Z63.4 ALPRAZolam  (XANAX ) 1 MG tablet    Ambulatory referral to Psychiatry    escitalopram  (LEXAPRO ) 10 MG tablet           Orders Placed in Encounter:    Meds ordered this encounter  Medications   ALPRAZolam  (XANAX ) 1 MG tablet    Sig: Take 1 tablet (1 mg total) by mouth 2 (two) times daily as needed for anxiety. Only for severe episodes, can't drive on.    Dispense:  20 tablet    Refill:  0   escitalopram  (LEXAPRO ) 10 MG tablet    Sig: Take 1 tablet (10 mg total) by mouth daily. Take just half tablet daily (5 mg) for first 2 weeks    Dispense:  30 tablet    Refill:  1    Orders Placed This Encounter  Procedures   Ambulatory referral to Psychiatry    Referral Priority:   Emergency    Referral Reason:   Specialty Services Required    Number of Visits Requested:   1        This document was synthesized by artificial intelligence ([employer]bridge) using HIPAA-compliant recording of the clinical interaction;   We discussed the use of AI scribe software for clinical note transcription with the patient, who gave verbal  consent to proceed. additional Info: This encounter employed state-of-the-art, real-time, collaborative documentation. The patient actively reviewed and assisted in updating their electronic medical record on a shared screen, ensuring transparency and facilitating joint problem-solving for the problem list, overview, and plan. This approach promotes accurate, informed care. The treatment plan was discussed and reviewed in detail, including medication safety, potential side effects, and all patient questions. We confirmed understanding and comfort with the plan. Follow-up instructions were established, including contacting the office for any concerns, returning if symptoms worsen, persist, or new symptoms develop, and precautions for potential emergency department visits.

## 2024-04-21 NOTE — Assessment & Plan Note (Signed)
 Adjustment disorder with mixed anxiety and depressed mood and bereavement due to loss of spouse   She experiences significant anxiety and difficulty sleeping after losing her spouse. Currently, she takes venlafaxine  for anxiety and vasomotor symptoms. Lorazepam  was ineffective. Switch to Xanax  for acute anxiety episodes, with a discussion on dependency risks and the importance of non-pharmacological interventions. Refer to psychiatry for further management and potential antipsychotic prescription for work-related anxiety. Emphasize avoiding driving or childcare after taking Xanax  due to its sedative effects. Encourage continuation of self-care activities and social support. Consider Lexapro  if venlafaxine  is not effective or tolerated.  Vasomotor symptoms (hot flashes)   She experiences hot flashes managed with venlafaxine . Increase the venlafaxine  dose to better manage symptoms. Monitor for side effects and effectiveness of the increased dose.

## 2024-04-21 NOTE — Assessment & Plan Note (Addendum)
 Adjustment disorder with mixed anxiety and depressed mood and bereavement due to loss of spouse   She experiences significant anxiety and difficulty sleeping after losing her spouse. Currently, she takes venlafaxine  for anxiety and vasomotor symptoms. Lorazepam  was ineffective. Switch to Xanax  for acute anxiety episodes, with a discussion on dependency risks and the importance of non-pharmacological interventions. Refer to psychiatry for further management and potential antipsychotic prescription for work-related anxiety. Emphasize avoiding driving or childcare after taking Xanax  due to its sedative effects. Encourage continuation of self-care activities and social support. Consider Lexapro  if venlafaxine  is not effective or tolerated.  Vasomotor symptoms (hot flashes)   She experiences hot flashes managed with venlafaxine . Increase the venlafaxine  dose to better manage symptoms. Monitor for side effects and effectiveness of the increased dose.

## 2024-04-21 NOTE — Patient Instructions (Signed)
 It was a pleasure seeing you today! Your health and satisfaction are our top priorities.  Lydia Cone, MD  VISIT SUMMARY: During your visit, we discussed your increased anxiety and sleep disturbances following the loss of your husband. We reviewed your current medications and self-care activities, and we made some adjustments to better manage your symptoms.  YOUR PLAN: -ADJUSTMENT DISORDER WITH MIXED ANXIETY AND DEPRESSED MOOD AND BEREAVEMENT: This condition involves significant anxiety and depression following a stressful life event, such as the loss of a loved one. We will switch your medication to Xanax  for acute anxiety episodes, but please be aware of the risk of dependency and avoid driving or childcare after taking it. We will also refer you to psychiatry for further management and consider Lexapro  if venlafaxine  is not effective. Continue your self-care activities and lean on your social support system.  -VASOMOTOR SYMPTOMS (HOT FLASHES): These are episodes of sudden warmth, often associated with menopause. We will increase your venlafaxine  dose to better manage these symptoms. Please monitor for any side effects and let us  know how the increased dose works for you.  INSTRUCTIONS: Please follow up with psychiatry for further management of your anxiety and potential new medications. Continue your self-care activities and social support. Monitor the effectiveness and any side effects of the increased venlafaxine  dose and report back during your next visit.  Your Providers PCP: Thompson Lydia MATSU, MD,  [phone]) Referring Provider: Cone Lydia MATSU, MD,  [phone]) Care Team Provider: Elmira Newman PARAS, MD,  [phone]) Care Team Provider: Lee Dorthea Kay, MD,  [phone]) Care Team Provider: Skeet Juliene SAUNDERS, [place],  [phone]) Care Team Provider: Ladona Heinz, MD,  [phone]) Care Team Provider: Elmira Newman PARAS, MD,  [phone]) Care Team Provider: Maranda Lister D Care Team Provider: Carla Milling, RPH-CPP,  [phone])  NEXT STEPS: [x]  Early Intervention: Schedule sooner appointment, call our on-call services, or go to emergency room if there is any significant Increase in pain or discomfort New or worsening symptoms Sudden or severe changes in your health [x]  Flexible Follow-Up: We recommend a Return in about 3 weeks (around 05/10/2024). for optimal routine care. This allows for progress monitoring and treatment adjustments. [x]  Preventive Care: Schedule your annual preventive care visit! It's typically covered by insurance and helps identify potential health issues early. [x]  Lab & X-ray Appointments: Incomplete tests scheduled today, or call to schedule. X-rays: [facility] (M-F, 8:30am-noon or 1pm-5pm). [x]  Medical Information Release: Sign a release form at front desk to obtain relevant medical information we don't have.  MAKING THE MOST OF OUR FOCUSED 20 MINUTE APPOINTMENTS: [x]   Clearly state your top concerns at the beginning of the visit to focus our discussion [x]   If you anticipate you will need more time, please inform the front desk during scheduling - we can book multiple appointments in the same week. [x]   If you have transportation problems- use our convenient video appointments or ask about transportation support. [x]   We can get down to business faster if you use MyChart to update information before the visit and submit non-urgent questions before your visit. Thank you for taking the time to provide details through MyChart.  Let our nurse know and she can import this information into your encounter documents.  Arrival and Wait Times: [x]   Arriving on time ensures that everyone receives prompt attention. [x]   Early morning (8a) and afternoon (1p) appointments tend to have shortest wait times. [x]   Unfortunately, we cannot delay appointments for late  arrivals or hold slots during phone calls.  Getting Answers  and Following Up [x]   Simple Questions & Concerns: For quick questions or basic follow-up after your visit, reach us  at [phone] or MyChart messaging. [x]   Complex Concerns: If your concern is more complex, scheduling an appointment might be best. Discuss this with the staff to find the most suitable option. [x]   Lab & Imaging Results: We'll contact you directly if results are abnormal or you don't use MyChart. Most normal results will be on MyChart within 2-3 business days, with a review message from Dr. Jesus. Haven't heard back in 2 weeks? Need results sooner? Contact us  at [phone]. [x]   Referrals: Our referral coordinator will manage specialist referrals. The specialist's office should contact you within 2 weeks to schedule an appointment. Call us  if you haven't heard from them after 2 weeks.  Staying Connected [x]   MyChart: Activate your MyChart for the fastest way to access results and message us . See the last page of this paperwork for instructions on how to activate.  Bring to Your Next Appointment [x]   Medications: Please bring all your medication bottles to your next appointment to ensure we have an accurate record of your prescriptions. [x]   Health Diaries: If you're monitoring any health conditions at home, keeping a diary of your readings can be very helpful for discussions at your next appointment.  Billing [x]   X-ray & Lab Orders: These are billed by separate companies. Contact the invoicing company directly for questions or concerns. [x]   Visit Charges: Discuss any billing inquiries with our administrative services team.  Your Satisfaction Matters [x]   Share Your Experience: We strive for your satisfaction! If you have any complaints, or preferably compliments, please let Dr. Jesus know directly or contact our Practice Administrators, Manuelita Rubin or [employer], by asking at the front desk.   Reviewing Your Records [x]   Review this early draft of your  clinical encounter notes below and the final encounter summary tomorrow on MyChart after its been completed.  All orders placed so far are visible here: Mixed anxiety depressive disorder Assessment & Plan: Adjustment disorder with mixed anxiety and depressed mood and bereavement due to loss of spouse   She experiences significant anxiety and difficulty sleeping after losing her spouse. Currently, she takes venlafaxine  for anxiety and vasomotor symptoms. Lorazepam  was ineffective. Switch to Xanax  for acute anxiety episodes, with a discussion on dependency risks and the importance of non-pharmacological interventions. Refer to psychiatry for further management and potential antipsychotic prescription for work-related anxiety. Emphasize avoiding driving or childcare after taking Xanax  due to its sedative effects. Encourage continuation of self-care activities and social support. Consider Lexapro  if venlafaxine  is not effective or tolerated.  Vasomotor symptoms (hot flashes)   She experiences hot flashes managed with venlafaxine . Increase the venlafaxine  dose to better manage symptoms. Monitor for side effects and effectiveness of the increased dose.  Orders: -     Ambulatory referral to Psychiatry -     Escitalopram  Oxalate; Take 1 tablet (10 mg total) by mouth daily. Take just half tablet daily (5 mg) for first 2 weeks  Dispense: 30 tablet; Refill: 1 -     Ambulatory referral to Psychology -     Venlafaxine  HCl ER; Take 1 capsule (75 mg total) by mouth daily with breakfast. Hold off on Lexapro  and try 75 mg boosted dose of Effexor  first  Dispense: 90 capsule; Refill: 3  Grief -     ALPRAZolam ; Take 1 tablet (  1 mg total) by mouth 2 (two) times daily as needed for anxiety. Only for severe episodes, can't drive on.  Dispense: 20 tablet; Refill: 0 -     Ambulatory referral to Psychiatry -     Escitalopram  Oxalate; Take 1 tablet (10 mg total) by mouth daily. Take just half tablet daily (5 mg) for first 2  weeks  Dispense: 30 tablet; Refill: 1 -     Ambulatory referral to Psychology  Bereavement -     ALPRAZolam ; Take 1 tablet (1 mg total) by mouth 2 (two) times daily as needed for anxiety. Only for severe episodes, can't drive on.  Dispense: 20 tablet; Refill: 0 -     Ambulatory referral to Psychiatry -     Escitalopram  Oxalate; Take 1 tablet (10 mg total) by mouth daily. Take just half tablet daily (5 mg) for first 2 weeks  Dispense: 30 tablet; Refill: 1 -     Ambulatory referral to Psychology  Anxiety disorder, unspecified type -     Venlafaxine  HCl ER; Take 1 capsule (75 mg total) by mouth daily with breakfast. Hold off on Lexapro  and try 75 mg boosted dose of Effexor  first  Dispense: 90 capsule; Refill: 3  Hot flashes -     Venlafaxine  HCl ER; Take 1 capsule (75 mg total) by mouth daily with breakfast. Hold off on Lexapro  and try 75 mg boosted dose of Effexor  first  Dispense: 90 capsule; Refill: 3

## 2024-04-22 ENCOUNTER — Other Ambulatory Visit: Payer: Self-pay

## 2024-04-23 ENCOUNTER — Other Ambulatory Visit: Payer: Self-pay

## 2024-04-30 ENCOUNTER — Other Ambulatory Visit: Payer: Self-pay

## 2024-05-10 ENCOUNTER — Encounter: Payer: Self-pay | Admitting: Gastroenterology

## 2024-05-13 ENCOUNTER — Other Ambulatory Visit: Payer: Self-pay

## 2024-05-14 ENCOUNTER — Other Ambulatory Visit: Payer: Self-pay

## 2024-05-14 ENCOUNTER — Telehealth: Payer: Self-pay

## 2024-05-14 ENCOUNTER — Ambulatory Visit: Admitting: Internal Medicine

## 2024-05-14 DIAGNOSIS — Z1211 Encounter for screening for malignant neoplasm of colon: Secondary | ICD-10-CM

## 2024-05-14 DIAGNOSIS — Z8673 Personal history of transient ischemic attack (TIA), and cerebral infarction without residual deficits: Secondary | ICD-10-CM

## 2024-05-14 MED ORDER — WEGOVY 1 MG/0.5ML ~~LOC~~ SOAJ
1.0000 mg | SUBCUTANEOUS | 11 refills | Status: DC
  Filled 2024-05-14: qty 2, 28d supply, fill #0

## 2024-05-14 NOTE — Addendum Note (Signed)
 Addended by: Duquan Gillooly G on: 05/14/2024 07:23 PM   Modules accepted: Orders

## 2024-05-14 NOTE — Telephone Encounter (Signed)
 Copied from CRM [id number]. Topic: Clinical - Medication Question >> May 14, 2024  8:44 AM Viola F wrote: Reason for CRM: Patient wants to know if she can have a 3 month supply of the Wegovy  medication sent to [facility] pharmacy on file, please respond via MyChart

## 2024-05-14 NOTE — Telephone Encounter (Signed)
 Copied from CRM [id number]. Topic: Clinical - Medical Advice >> May 14, 2024  8:45 AM Viola F wrote: Reason for CRM: Patient says she's canceling colonoscopy scheduled for next month because she can't take a day off of work - wants to know if she can do the at home kit from Dr. Jesus in the meantime? Please respond via MyChart  Sent in home kit for pt will send message to pt via my chart.

## 2024-05-15 ENCOUNTER — Other Ambulatory Visit: Payer: Self-pay

## 2024-05-20 ENCOUNTER — Other Ambulatory Visit: Payer: Self-pay | Admitting: Internal Medicine

## 2024-05-20 ENCOUNTER — Other Ambulatory Visit: Payer: Self-pay

## 2024-05-20 DIAGNOSIS — F4321 Adjustment disorder with depressed mood: Secondary | ICD-10-CM

## 2024-05-20 DIAGNOSIS — Z634 Disappearance and death of family member: Secondary | ICD-10-CM

## 2024-05-21 ENCOUNTER — Other Ambulatory Visit: Payer: Self-pay

## 2024-05-23 ENCOUNTER — Other Ambulatory Visit (HOSPITAL_COMMUNITY): Payer: Self-pay

## 2024-05-23 ENCOUNTER — Telehealth: Payer: Self-pay

## 2024-05-23 NOTE — Telephone Encounter (Signed)
 Pharmacy Patient Advocate Encounter   Received notification from CoverMyMeds that prior authorization for Veozah  45MG  tablets is required/requested.   Insurance verification completed.   The patient is insured through [employer].   Per test claim: PA required; PA submitted to above mentioned insurance via Latent Key/confirmation #/EOC [id number] Status is pending

## 2024-05-24 ENCOUNTER — Other Ambulatory Visit (HOSPITAL_COMMUNITY): Payer: Self-pay

## 2024-05-24 DIAGNOSIS — Z8673 Personal history of transient ischemic attack (TIA), and cerebral infarction without residual deficits: Secondary | ICD-10-CM

## 2024-05-24 DIAGNOSIS — N951 Menopausal and female climacteric states: Secondary | ICD-10-CM

## 2024-05-24 NOTE — Telephone Encounter (Signed)
 Pharmacy Patient Advocate Encounter  Received notification from [facility] [employer]  that Prior Authorization for Veozah  45MG  tablets has been APPROVED from 05/24/2024 to 05/24/2025. Ran test claim, Copay is $0.00. This test claim was processed through [facility]- copay amounts may vary at other pharmacies due to pharmacy/plan contracts, or as the patient moves through the different stages of their insurance plan.   PA #/Case ID/Reference #: [id number]

## 2024-05-27 ENCOUNTER — Other Ambulatory Visit: Payer: Self-pay

## 2024-05-28 ENCOUNTER — Telehealth: Payer: Self-pay

## 2024-05-28 ENCOUNTER — Other Ambulatory Visit: Payer: Self-pay

## 2024-05-28 ENCOUNTER — Other Ambulatory Visit: Payer: Self-pay | Admitting: Internal Medicine

## 2024-05-28 ENCOUNTER — Encounter: Payer: Self-pay | Admitting: Pharmacist

## 2024-05-28 DIAGNOSIS — N951 Menopausal and female climacteric states: Secondary | ICD-10-CM

## 2024-05-28 MED ORDER — WEGOVY 1.7 MG/0.75ML ~~LOC~~ SOAJ
1.7000 mg | SUBCUTANEOUS | 5 refills | Status: DC
  Filled 2024-05-28: qty 3, 28d supply, fill #0

## 2024-05-28 MED ORDER — VEOZAH 45 MG PO TABS
1.0000 | ORAL_TABLET | Freq: Every day | ORAL | 4 refills | Status: AC
  Filled 2024-05-28: qty 90, 90d supply, fill #0
  Filled 2024-08-15: qty 90, 90d supply, fill #1

## 2024-05-28 NOTE — Telephone Encounter (Unsigned)
 Copied from CRM #[id number]. Topic: Clinical - Medication Refill >> May 28, 2024  9:17 AM Rosina BIRCH wrote: Medication: Fezolinetant  (VEOZAH ) 45 MG TABS (90 day refill)  Has the patient contacted their pharmacy? Yes (Agent: If no, request that the patient contact the pharmacy for the refill. If patient does not wish to contact the pharmacy document the reason why and proceed with request.) (Agent: If yes, when and what did the pharmacy advise?)  This is the patient's preferred pharmacy:  [facility] [address] [place] [address] [phone]  Is this the correct pharmacy for this prescription? Yes If no, delete pharmacy and type the correct one.   Has the prescription been filled recently? no  Is the patient out of the medication? Yes  Has the patient been seen for an appointment in the last year OR does the patient have an upcoming appointment? Yes  Can we respond through MyChart? Yes  Agent: Please be advised that Rx refills may take up to 3 business days. We ask that you follow-up with your pharmacy.

## 2024-05-28 NOTE — Telephone Encounter (Signed)
 Copied from CRM [id number]. Topic: General - Other >> May 28, 2024  9:21 AM Lydia Thompson wrote: Reason for CRM: Patient called stating she want to increase her dose of Wegovy    [phone]

## 2024-05-28 NOTE — Telephone Encounter (Signed)
 Copied from CRM [id number]. Topic: General - Other >> May 28, 2024  9:21 AM Rosina BIRCH wrote: Reason for CRM: Patient called stating she want to increase her dose of Wegovy    [phone]  Sent pt message via my chart need to know the dose she is on now

## 2024-05-29 ENCOUNTER — Other Ambulatory Visit: Payer: Self-pay

## 2024-05-29 ENCOUNTER — Other Ambulatory Visit (HOSPITAL_COMMUNITY): Payer: Self-pay

## 2024-05-30 ENCOUNTER — Other Ambulatory Visit: Payer: Self-pay

## 2024-05-31 ENCOUNTER — Telehealth: Payer: Self-pay | Admitting: *Deleted

## 2024-05-31 ENCOUNTER — Ambulatory Visit

## 2024-05-31 NOTE — Telephone Encounter (Signed)
 Pt was scheduled for PV in person did not show. LM with call back # and instructed pt to call by end of day and reschedule PV or  procedure will be cancled

## 2024-05-31 NOTE — Progress Notes (Unsigned)
 Lydia Thompson

## 2024-06-04 ENCOUNTER — Other Ambulatory Visit: Payer: Self-pay

## 2024-06-04 ENCOUNTER — Other Ambulatory Visit: Payer: Self-pay | Admitting: Internal Medicine

## 2024-06-04 MED ORDER — FUROSEMIDE 40 MG PO TABS
40.0000 mg | ORAL_TABLET | Freq: Every day | ORAL | 3 refills | Status: DC
  Filled 2024-06-04 – 2024-06-17 (×2): qty 30, 30d supply, fill #0
  Filled 2024-06-25: qty 90, 90d supply, fill #0

## 2024-06-11 ENCOUNTER — Other Ambulatory Visit: Payer: Self-pay

## 2024-06-11 ENCOUNTER — Ambulatory Visit (INDEPENDENT_AMBULATORY_CARE_PROVIDER_SITE_OTHER): Admitting: Internal Medicine

## 2024-06-11 ENCOUNTER — Encounter: Payer: Self-pay | Admitting: Internal Medicine

## 2024-06-11 VITALS — BP 126/76 | HR 67 | Temp 98.0°F | Ht 63.0 in | Wt 217.4 lb

## 2024-06-11 DIAGNOSIS — N952 Postmenopausal atrophic vaginitis: Secondary | ICD-10-CM

## 2024-06-11 DIAGNOSIS — I5032 Chronic diastolic (congestive) heart failure: Secondary | ICD-10-CM

## 2024-06-11 DIAGNOSIS — N951 Menopausal and female climacteric states: Secondary | ICD-10-CM

## 2024-06-11 DIAGNOSIS — Z79899 Other long term (current) drug therapy: Secondary | ICD-10-CM

## 2024-06-11 DIAGNOSIS — I422 Other hypertrophic cardiomyopathy: Secondary | ICD-10-CM

## 2024-06-11 DIAGNOSIS — I10 Essential (primary) hypertension: Secondary | ICD-10-CM

## 2024-06-11 DIAGNOSIS — F418 Other specified anxiety disorders: Secondary | ICD-10-CM

## 2024-06-11 DIAGNOSIS — R29818 Other symptoms and signs involving the nervous system: Secondary | ICD-10-CM | POA: Insufficient documentation

## 2024-06-11 DIAGNOSIS — Z8673 Personal history of transient ischemic attack (TIA), and cerebral infarction without residual deficits: Secondary | ICD-10-CM

## 2024-06-11 DIAGNOSIS — R0683 Snoring: Secondary | ICD-10-CM

## 2024-06-11 MED ORDER — WEGOVY 2.4 MG/0.75ML ~~LOC~~ SOAJ
2.4000 mg | SUBCUTANEOUS | 11 refills | Status: AC
  Filled 2024-06-11: qty 3, 28d supply, fill #0
  Filled 2024-07-03 – 2024-07-10 (×2): qty 3, 28d supply, fill #1
  Filled 2024-08-08: qty 3, 28d supply, fill #2
  Filled 2024-09-05 – 2024-09-26 (×3): qty 3, 28d supply, fill #3

## 2024-06-11 NOTE — Assessment & Plan Note (Signed)
 Adjustment disorder with mixed anxiety and depressed mood is managed with Venlafaxine  XR (Effexor  XR) for better control of hot flashes and mood. Stress from recent life events has contributed to symptoms, but therapy is ongoing and beneficial. Continue Venlafaxine  XR 37.5 MG daily and therapy sessions. Reorder psychiatry referral for additional support if needed.

## 2024-06-11 NOTE — Assessment & Plan Note (Signed)
 Transient ischemic attack (TIA) is managed with Aspirin  and Atorvastatin  (Lipitor) to reduce the risk of future events. Blood pressure and weight management are also crucial. Continue Aspirin  EC 81 MG daily and Atorvastatin  40 MG daily.  Obesity is managed with Semaglutide  (Wegovy ), resulting in significant weight loss from 230 lbs to 213 lbs. The medication is well-tolerated despite causing manageable diarrhea. Continue Semaglutide  1.7 MG weekly and increase the dose to 2.4 MG if tolerated. Monitor weight and adjust medications as needed.

## 2024-06-11 NOTE — Assessment & Plan Note (Signed)
 Vasomotor symptoms (hot flashes) are managed with Fezolinetant  (Veozah ) and Venlafaxine  XR (Effexor  XR). She reports improvement in symptoms and has a 90-day supply of Fezolinetant . Continue Fezolinetant  45 MG daily and Venlafaxine  XR 37.5 MG daily. Had made a personal choice to discontinue estrogen cream in wake of transient ishemic attack (TIA).

## 2024-06-11 NOTE — Patient Instructions (Signed)
 It was a pleasure seeing you today! Your health and satisfaction are our top priorities.  Bernardino Cone, MD  VISIT SUMMARY: Today, we discussed your blood pressure management and stress-related concerns. Your blood pressure readings have been elevated, likely due to stress from a recent court case involving your son. We reviewed your current medications and their effectiveness, including Toprol  XL, Camzyos , Wegovy , Effexor , and others. You have experienced significant weight loss and improved heart health. We also addressed your stress and anxiety management, and you are continuing therapy, which you find beneficial.  YOUR PLAN: -HYPERTROPHIC CARDIOMYOPATHY: Hypertrophic cardiomyopathy is a condition where the heart muscle becomes abnormally thick. You are managing this with Camzyos , which helps reduce the size of your heart and improve symptoms. Continue taking Camzyos  2.5 MG daily. Consult your cardiologist before starting any new exercise program and monitor your heart rate closely during exercise using your Apple Watch.  -HYPERTENSION: Hypertension, or high blood pressure, is well-controlled with your current medications, Metoprolol  and Losartan . Your home and office blood pressure readings are stable. Continue taking Metoprolol  150 MG daily and Losartan  12.5 MG daily. Monitor your blood pressure at home and report any high readings to your cardiologist. Managing stress is also important to prevent spikes in blood pressure.  -TRANSIENT ISCHEMIC ATTACK (TIA): A transient ischemic attack (TIA) is a temporary period of symptoms similar to those of a stroke. You are managing this with Aspirin  and Atorvastatin  to reduce the risk of future events. Continue taking Aspirin  EC 81 MG daily and Atorvastatin  40 MG daily. Blood pressure and weight management are also crucial.  -SUSPECTED SLEEP APNEA: Sleep apnea is a condition where breathing repeatedly stops and starts during sleep. It may be contributing to  your blood pressure issues. Use your Apple Watch to monitor your sleep patterns and detect any signs of sleep apnea. Report your findings to us  for further evaluation.  -OBESITY: Obesity is being managed with Semaglutide  (Wegovy ), which has helped you lose weight from 230 lbs to 213 lbs. Continue taking Semaglutide  1.7 MG weekly and increase the dose to 2.4 MG if tolerated. Monitor your weight and adjust medications as needed.  -ADJUSTMENT DISORDER WITH MIXED ANXIETY AND DEPRESSED MOOD: Adjustment disorder with mixed anxiety and depressed mood is a condition where stress and anxiety are triggered by significant life changes. You are managing this with Venlafaxine  XR (Effexor  XR) and ongoing therapy. Continue taking Venlafaxine  XR 37.5 MG daily and attending therapy sessions. We will reorder a psychiatry referral for additional support if needed.  -VASOMOTOR SYMPTOMS (HOT FLASHES): Vasomotor symptoms, such as hot flashes, are being managed with Fezolinetant  (Veozah ) and Venlafaxine  XR (Effexor  XR). You have reported improvement in your symptoms. Continue taking Fezolinetant  45 MG daily and Venlafaxine  XR 37.5 MG daily.  INSTRUCTIONS: Pleas e continue monitoring your blood pressure at home and report any high readings to your cardiologist. Use your Apple Watch to monitor your sleep patterns and report any signs of sleep apnea. Continue with your current medications and therapy sessions. If you experience any new symptoms or have concerns, please contact our office. We will reorder a psychiatry referral for additional support if needed.  Your Providers PCP: Cone Bernardino MATSU, MD,  [phone]) Referring Provider: Cone Bernardino MATSU, MD,  [phone]) Care Team Provider: Elmira Newman PARAS, MD,  [phone]) Care Team Provider: Lee Dorthea Kay, MD,  [phone]) Care Team Provider: Skeet Juliene SAUNDERS, [place],  [phone]) Care Team Provider: Ladona Heinz, MD,  [phone]) Care Team Provider:  Elmira Newman PARAS,  MD,  [phone]) Care Team Provider: Maranda Lister D Care Team Provider: Carla Milling, RPH-CPP,  [phone])  NEXT STEPS: [x]  Early Intervention: Schedule sooner appointment, call our on-call services, or go to emergency room if there is any significant Increase in pain or discomfort New or worsening symptoms Sudden or severe changes in your health [x]  Flexible Follow-Up: We recommend a No follow-ups on file. for optimal routine care. This allows for progress monitoring and treatment adjustments. [x]  Preventive Care: Schedule your annual preventive care visit! It's typically covered by insurance and helps identify potential health issues early. [x]  Lab & X-ray Appointments: Incomplete tests scheduled today, or call to schedule. X-rays: [facility] (M-F, 8:30am-noon or 1pm-5pm). [x]  Medical Information Release: Sign a release form at front desk to obtain relevant medical information we don't have.  MAKING THE MOST OF OUR FOCUSED 20 MINUTE APPOINTMENTS: [x]   Clearly state your top concerns at the beginning of the visit to focus our discussion [x]   If you anticipate you will need more time, please inform the front desk during scheduling - we can book multiple appointments in the same week. [x]   If you have transportation problems- use our convenient video appointments or ask about transportation support. [x]   We can get down to business faster if you use MyChart to update information before the visit and submit non-urgent questions before your visit. Thank you for taking the time to provide details through MyChart.  Let our nurse know and she can import this information into your encounter documents.  Arrival and Wait Times: [x]   Arriving on time ensures that everyone receives prompt attention. [x]   Early morning (8a) and afternoon (1p) appointments tend to have shortest wait times. [x]   Unfortunately, we cannot delay appointments for late arrivals or  hold slots during phone calls.  Getting Answers and Following Up [x]   Simple Questions & Concerns: For quick questions or basic follow-up after your visit, reach us  at [phone] or MyChart messaging. [x]   Complex Concerns: If your concern is more complex, scheduling an appointment might be best. Discuss this with the staff to find the most suitable option. [x]   Lab & Imaging Results: We'll contact you directly if results are abnormal or you don't use MyChart. Most normal results will be on MyChart within 2-3 business days, with a review message from Dr. Jesus. Haven't heard back in 2 weeks? Need results sooner? Contact us  at [phone]. [x]   Referrals: Our referral coordinator will manage specialist referrals. The specialist's office should contact you within 2 weeks to schedule an appointment. Call us  if you haven't heard from them after 2 weeks.  Staying Connected [x]   MyChart: Activate your MyChart for the fastest way to access results and message us . See the last page of this paperwork for instructions on how to activate.  Bring to Your Next Appointment [x]   Medications: Please bring all your medication bottles to your next appointment to ensure we have an accurate record of your prescriptions. [x]   Health Diaries: If you're monitoring any health conditions at home, keeping a diary of your readings can be very helpful for discussions at your next appointment.  Billing [x]   X-ray & Lab Orders: These are billed by separate companies. Contact the invoicing company directly for questions or concerns. [x]   Visit Charges: Discuss any billing inquiries with our administrative services team.  Your Satisfaction Matters [x]   Share Your Experience: We strive for your satisfaction! If you have any complaints, or preferably compliments,  please let Dr. Jesus know directly or contact our Practice Administrators, Manuelita Rubin or [employer], by asking at the front desk.   Reviewing Your  Records [x]   Review this early draft of your clinical encounter notes below and the final encounter summary tomorrow on MyChart after its been completed.  All orders placed so far are visible here: H/O TIA (transient ischemic attack) and stroke -     Wegovy ; Inject 2.4 mg into the skin once a week.  Dispense: 3 mL; Refill: 11 -     Lipid panel; Future -     Comprehensive metabolic panel with GFR; Future -     CBC with Differential/Platelet; Future -     TSH Rfx on Abnormal to Free T4; Future  Hypertrophic cardiomyopathy (HCC)  Chronic heart failure with preserved ejection fraction (HCC)  Essential hypertension -     Lipid panel; Future -     Comprehensive metabolic panel with GFR; Future -     CBC with Differential/Platelet; Future -     TSH Rfx on Abnormal to Free T4; Future  Mixed anxiety depressive disorder -     Ambulatory referral to Psychiatry  Snoring  Suspected sleep apnea  Morbid obesity (HCC) -     TSH Rfx on Abnormal to Free T4; Future  Post-menopause atrophic vaginitis  Hot flashes, menopausal  Medication management -     TSH Rfx on Abnormal to Free T4; Future

## 2024-06-11 NOTE — Assessment & Plan Note (Signed)
 Hypertension is well-controlled with home readings of 123/80 mmHg and office readings of 126/76 mmHg. Current management includes Metoprolol  succinate (Toprol -XL) and Losartan  (Cozaar ). Stress management is important to prevent spikes in blood pressure. Continue Metoprolol  150 MG daily and Losartan  12.5 MG daily. Monitor blood pressure at home and report high readings to the cardiologist. continues on, as of 06/11/2024 Current hypertension medications:       Sig   losartan  (COZAAR ) 25 MG tablet (Taking) Take 0.5 tablets (12.5 mg total) by mouth daily.   metoprolol  succinate (TOPROL -XL) 100 MG 24 hr tablet (Taking) Take 1.5 tablets (150 mg total) by mouth daily. Take with or immediately following a meal.   furosemide  (LASIX ) 40 MG tablet Take 1 tablet (40 mg total) by mouth daily.   mavacamten  (CAMZYOS ) 2.5 MG CAPS capsule Take 1 capsule (2.5 mg total) by mouth daily.   spironolactone  (ALDACTONE ) 25 MG tablet Take 1 tablet (25 mg total) by mouth daily.

## 2024-06-11 NOTE — Assessment & Plan Note (Signed)
 Hypertrophic cardiomyopathy is managed with Mavacamten  (Camzyos ), which reduces heart size and improves symptoms. The condition is partly genetic and requires careful management, especially with exercise. Continue Mavacamten  2.5 MG daily. Consult a cardiologist before starting any new exercise program and monitor heart rate closely during exercise using an Apple Watch.

## 2024-06-11 NOTE — Progress Notes (Signed)
 ==============================  [facility]: [id number]   -- Medical Office Visit --  Patient: Lydia Thompson      Age: 48 y.o.       Sex:  female  Date:   06/11/2024 Today's Healthcare Provider: Bernardino KANDICE Cone, MD  ==============================   Chief Complaint: Hypertension Blood pressure has been super high  Discussed the use of AI scribe software for clinical note transcription with the patient, who gave verbal consent to proceed.  History of Present Illness 48 year old female with hypertension and hypertrophic cardiomyopathy who presents for blood pressure management and stress-related concerns.  She has been experiencing elevated blood pressure readings, with the highest being 106, which she attributes to stress, particularly related to a recent court case involving her son. Her current home blood pressure reading is 123/80, similar to the reading taken during the visit. She uses black oil seed pills to help manage her blood pressure.  Her medication regimen includes Toprol  XL, which has been increased over the years from 25 mg to 100 mg, and Camzyos , which has helped reduce the size of her heart and improve symptoms of hypertrophic cardiomyopathy. Her heart rate typically ranges from 60 to 73 bpm, and she can walk without experiencing breathlessness, indicating improvement.  She is on Wegovy , currently at a dose of 1.7 mg, which has aided in weight loss, reducing her weight from 230 lbs to 213 lbs. She experiences diarrhea as a side effect but finds it manageable. She also takes Effexor  75 mg for hot flash control, which has been effective.  She has a history of a TIA and is on aspirin  for heart protection. Her medication regimen includes Lipitor, losartan , and spironolactone  for blood pressure and heart health. She uses Symbicort  as needed and has a rescue inhaler. Lasix  is used to manage fluid retention, noting that her hands swell if she  misses a dose.  She has been experiencing stress related to family issues and the recent court case, which she believes has impacted her blood pressure. She continues to see a therapist regularly and finds it beneficial for managing stress and anxiety. She also uses an Apple Watch to monitor her health metrics, including heart rate and steps, and is learning to use it for better health management.  Background Reviewed: Problem List: has Essential hypertension; Hypertrophic cardiomyopathy (HCC); H/O TIA (transient ischemic attack) and stroke; Chronic heart failure with preserved ejection fraction (HCC); Morbid obesity (HCC); Post-menopause atrophic vaginitis; Dizziness; Pulmonary artery hypertension (HCC); Hot flashes, menopausal; Low libido; Nausea; Diastolic dysfunction; Mixed anxiety depressive disorder; Acute respiratory failure with hypoxia (HCC); SVT (supraventricular tachycardia); Hypokalemia; Hemoptysis; Hot flashes; Cysts of both ovaries; Gastroesophageal reflux disease without esophagitis; Bereavement; and Suspected sleep apnea on their problem list. Past Medical History:  has a past medical history of Anxiety, Chest pain of uncertain etiology (09/26/2017), CHF (congestive heart failure) (HCC), Hot flashes, menopausal (08/30/2022), Hypertension, and Low libido (08/30/2022). Past Surgical History:   has a past surgical history that includes Abdominal hysterectomy (08/23/2003). Social History:   reports that she has never smoked. She has never used smokeless tobacco. She reports current alcohol use. She reports that she does not use drugs. Family History:  family history includes Arthritis in her paternal uncle; Asthma in her paternal uncle; Cancer in her maternal grandmother; Cancer (age of onset: 32) in her mother; Heart disease in her paternal uncle; Hypertension in her mother and paternal uncle; Other in her sister. Allergies:  is  allergic to gabapentin .   Medication Reconciliation: Current  Outpatient Medications on File Prior to Visit  Medication Sig   aspirin  EC 81 MG tablet Take 1 tablet (81 mg total) by mouth daily. Swallow whole.   atorvastatin  (LIPITOR) 40 MG tablet Take 1 tablet (40 mg total) by mouth daily.   losartan  (COZAAR ) 25 MG tablet Take 0.5 tablets (12.5 mg total) by mouth daily.   metoprolol  succinate (TOPROL -XL) 100 MG 24 hr tablet Take 1.5 tablets (150 mg total) by mouth daily. Take with or immediately following a meal.   budesonide -formoterol  (SYMBICORT ) 160-4.5 MCG/ACT inhaler Inhale 2 puffs into the lungs in the morning and at bedtime. (Patient taking differently: Inhale 2 puffs into the lungs as needed.)   Fezolinetant  (VEOZAH ) 45 MG TABS Take 1 tablet (45 mg total) by mouth daily at 6 (six) AM.   furosemide  (LASIX ) 40 MG tablet Take 1 tablet (40 mg total) by mouth daily.   mavacamten  (CAMZYOS ) 2.5 MG CAPS capsule Take 1 capsule (2.5 mg total) by mouth daily.   ondansetron  (ZOFRAN -ODT) 4 MG disintegrating tablet Take 1 tablet (4 mg total) by mouth every 8 (eight) hours as needed for nausea or vomiting.   spironolactone  (ALDACTONE ) 25 MG tablet Take 1 tablet (25 mg total) by mouth daily.   venlafaxine  XR (EFFEXOR  XR) 75 MG 24 hr capsule Take 1 capsule (75 mg total) by mouth daily with breakfast. Hold off on Lexapro  and try 75 mg boosted dose of Effexor  first   No current facility-administered medications on file prior to visit.   Medications Discontinued During This Encounter  Medication Reason   semaglutide -weight management (WEGOVY ) 1.7 MG/0.75ML SOAJ SQ injection    escitalopram  (LEXAPRO ) 10 MG tablet Completed Course   ALPRAZolam  (XANAX ) 1 MG tablet Completed Course   estradiol  (ESTRACE ) 0.1 MG/GM vaginal cream      Physical Exam:    06/11/2024    8:43 AM 04/19/2024    9:52 AM 04/18/2024    1:18 PM  Vitals with BMI  Height 5' 3 5' 3 5' 3  Weight 217 lbs 6 oz 220 lbs 6 oz 222 lbs  BMI 38.52 39.05 39.34  Systolic 126 126 889  Diastolic 76 76  80  Pulse 67 68 89  Vital signs reviewed.  Nursing notes reviewed. Weight trend reviewed. Physical Activity: Insufficiently Active (06/07/2024)   Exercise Vital Sign    Days of Exercise per Week: 2 days    Minutes of Exercise per Session: 30 min   General Appearance:  No acute distress appreciable.   Well-groomed, healthy-appearing female.  Well proportioned with no abnormal fat distribution.  Good muscle tone. Pulmonary:  Normal work of breathing at rest, no respiratory distress apparent. SpO2: 98 %  Musculoskeletal: All extremities are intact.  Neurological:  Awake, alert, oriented, and engaged.  No obvious focal neurological deficits or cognitive impairments.  Sensorium seems unclouded.   Speech is clear and coherent with logical content. Psychiatric:  Appropriate mood, pleasant and cooperative demeanor, thoughtful and engaged during the exam   Verbalized to patient: Physical Exam VITALS: BP- 126/76 MEASUREMENTS: Weight- 213 pounds.   Results:   Verbalized to patient: Results      06/11/2024    9:01 AM 12/11/2023    8:31 AM 09/26/2023    1:41 PM 09/06/2023    8:38 AM  PHQ 2/9 Scores  PHQ - 2 Score 2 0 0 0  PHQ- 9 Score 3 0  3    {   No results  found for any visits on 06/11/24.} Appointment on 01/02/2024  Component Date Value Ref Range Status   S' Lateral 01/02/2024 2.20  cm Final   Area-P 1/2 01/02/2024 2.95  cm2 Final   Est EF 01/02/2024 70 - 75%   Final  Appointment on 12/12/2023  Component Date Value Ref Range Status   Area-P 1/2 12/12/2023 3.40  cm2 Final   S' Lateral 12/12/2023 2.40  cm Final   Radius 12/12/2023 0.50  cm Final   MV M vel 12/12/2023 4.91  m/s Final   MV Peak grad 12/12/2023 96.2  mmHg Final   Est EF 12/12/2023 60 - 65%   Final  Office Visit on 12/11/2023  Component Date Value Ref Range Status   TSH 12/11/2023 2.170  0.450 - 4.500 uIU/mL Final   Sodium 12/11/2023 138  135 - 145 mEq/L Final   Potassium 12/11/2023 3.5  3.5 - 5.1 mEq/L Final    Chloride 12/11/2023 101  96 - 112 mEq/L Final   CO2 12/11/2023 29  19 - 32 mEq/L Final   Glucose, Bld 12/11/2023 89  70 - 99 mg/dL Final   BUN 95/78/7974 13  6 - 23 mg/dL Final   Creatinine, Ser 12/11/2023 0.77  0.40 - 1.20 mg/dL Final   Total Bilirubin 12/11/2023 0.5  0.2 - 1.2 mg/dL Final   Alkaline Phosphatase 12/11/2023 61  39 - 117 U/L Final   AST 12/11/2023 14  0 - 37 U/L Final   ALT 12/11/2023 17  0 - 35 U/L Final   Total Protein 12/11/2023 7.7  6.0 - 8.3 g/dL Final   Albumin 95/78/7974 4.1  3.5 - 5.2 g/dL Final   GFR 95/78/7974 92.46  >60.00 mL/min Final   Calcium  12/11/2023 9.2  8.4 - 10.5 mg/dL Final   Hgb J8r MFr Bld 12/11/2023 5.6  4.6 - 6.5 % Final   Cholesterol 12/11/2023 105  0 - 200 mg/dL Final   Triglycerides 95/78/7974 70.0  0.0 - 149.0 mg/dL Final   HDL 95/78/7974 33.70 (L)  >60.99 mg/dL Final   VLDL 95/78/7974 14.0  0.0 - 40.0 mg/dL Final   LDL Cholesterol 12/11/2023 58  0 - 99 mg/dL Final   Total CHOL/HDL Ratio 12/11/2023 3   Final   NonHDL 12/11/2023 71.58   Final   WBC 12/11/2023 12.7 (H)  4.0 - 10.5 K/uL Final   RBC 12/11/2023 4.96  3.87 - 5.11 Mil/uL Final   Hemoglobin 12/11/2023 13.4  12.0 - 15.0 g/dL Final   HCT 95/78/7974 40.9  36.0 - 46.0 % Final   MCV 12/11/2023 82.6  78.0 - 100.0 fl Final   MCHC 12/11/2023 32.8  30.0 - 36.0 g/dL Final   RDW 95/78/7974 14.8  11.5 - 15.5 % Final   Platelets 12/11/2023 399.0  150.0 - 400.0 K/uL Final   Neutrophils Relative % 12/11/2023 69.3  43.0 - 77.0 % Final   Lymphocytes Relative 12/11/2023 22.2  12.0 - 46.0 % Final   Monocytes Relative 12/11/2023 5.6  3.0 - 12.0 % Final   Eosinophils Relative 12/11/2023 2.5  0.0 - 5.0 % Final   Basophils Relative 12/11/2023 0.4  0.0 - 3.0 % Final   Neutro Abs 12/11/2023 8.8 (H)  1.4 - 7.7 K/uL Final   Lymphs Abs 12/11/2023 2.8  0.7 - 4.0 K/uL Final   Monocytes Absolute 12/11/2023 0.7  0.1 - 1.0 K/uL Final   Eosinophils Absolute 12/11/2023 0.3  0.0 - 0.7 K/uL Final   Basophils  Absolute 12/11/2023 0.0  0.0 -  0.1 K/uL Final  Lab on 11/15/2023  Component Date Value Ref Range Status   Genetic Analysis Overall Interpret* 11/15/2023 Negative   Final   Genetic Disease Assessed 11/15/2023    Final                   Value:[facility] Population Screen is a screening test that analyzes 11 genes related to hereditary breast and ovarian cancer (HBOC) syndrome, Lynch syndrome, and familial hypercholesterolemia. This test only reports clinically significant pathogenic and  likely pathogenic variants but does not report variants of uncertain significance (VUS). In addition, analysis of the PMS2 gene excludes exons 11-15, which overlap with a known pseudogene (PMS2CL).    Genetic Analysis Report 11/15/2023    Final                   Value:No pathogenic or likely pathogenic variants were detected in the genes analyzed by this test.Genetic test results should be interpreted in the context of an individual's personal medical and family history. Alteration to medical management is NOT  recommended based solely on this result. Clinical correlation is advised.Additional Considerations- This is a screening test; individuals may still carry pathogenic or likely pathogenic variant(s) in the tested genes that are not detected by this test.-  For individuals at risk for these or other related conditions based on factors including personal or family history, diagnostic testing is recommended.- The absence of pathogenic or likely pathogenic variant(s) in the analyzed genes, while reassuring,  does not eliminate the possibility of a hereditary condition; there are other variants and genes associated with heart disease and hereditary cancer that are not included in this test.    Genes Tested 11/15/2023 See Notes   Final   Disclaimer 11/15/2023 See Notes   Final   Sequencing Location 11/15/2023 See Notes   Final   Interpretation Methods and Limitat* 11/15/2023 See Notes   Final  Appointment on  11/06/2023  Component Date Value Ref Range Status   S' Lateral 11/06/2023 3.00  cm Final   Area-P 1/2 11/06/2023 2.95  cm2 Final   Est EF 11/06/2023 65 - 70%   Final  Admission on 10/18/2023, Discharged on 10/18/2023  Component Date Value Ref Range Status   Lipase 10/18/2023 23  11 - 51 U/L Final   Sodium 10/18/2023 137  135 - 145 mmol/L Final   Potassium 10/18/2023 4.7  3.5 - 5.1 mmol/L Final   Chloride 10/18/2023 102  98 - 111 mmol/L Final   CO2 10/18/2023 28  22 - 32 mmol/L Final   Glucose, Bld 10/18/2023 83  70 - 99 mg/dL Final   BUN 97/73/7974 18  6 - 20 mg/dL Final   Creatinine, Ser 10/18/2023 0.80  0.44 - 1.00 mg/dL Final   Calcium  10/18/2023 10.3  8.9 - 10.3 mg/dL Final   Total Protein 97/73/7974 9.5 (H)  6.5 - 8.1 g/dL Final   Albumin 97/73/7974 4.7  3.5 - 5.0 g/dL Final   AST 97/73/7974 28  15 - 41 U/L Final   ALT 10/18/2023 17  0 - 44 U/L Final   Alkaline Phosphatase 10/18/2023 74  38 - 126 U/L Final   Total Bilirubin 10/18/2023 0.6  0.0 - 1.2 mg/dL Final   GFR, Estimated 10/18/2023 >60  >60 mL/min Final   Anion gap 10/18/2023 7  5 - 15 Final   WBC 10/18/2023 13.8 (H)  4.0 - 10.5 K/uL Final   RBC 10/18/2023 5.78 (H)  3.87 - 5.11 MIL/uL  Final   Hemoglobin 10/18/2023 15.7 (H)  12.0 - 15.0 g/dL Final   HCT 97/73/7974 47.8 (H)  36.0 - 46.0 % Final   MCV 10/18/2023 82.7  80.0 - 100.0 fL Final   MCH 10/18/2023 27.2  26.0 - 34.0 pg Final   MCHC 10/18/2023 32.8  30.0 - 36.0 g/dL Final   RDW 97/73/7974 13.9  11.5 - 15.5 % Final   Platelets 10/18/2023 431 (H)  150 - 400 K/uL Final   nRBC 10/18/2023 0.0  0.0 - 0.2 % Final   Color, Urine 10/18/2023 YELLOW  YELLOW Final   APPearance 10/18/2023 CLEAR  CLEAR Final   Specific Gravity, Urine 10/18/2023 1.015  1.005 - 1.030 Final   pH 10/18/2023 5.5  5.0 - 8.0 Final   Glucose, UA 10/18/2023 NEGATIVE  NEGATIVE mg/dL Final   Hgb urine dipstick 10/18/2023 NEGATIVE  NEGATIVE Final   Bilirubin Urine 10/18/2023 NEGATIVE  NEGATIVE Final    Ketones, ur 10/18/2023 NEGATIVE  NEGATIVE mg/dL Final   Protein, ur 97/73/7974 NEGATIVE  NEGATIVE mg/dL Final   Nitrite 97/73/7974 NEGATIVE  NEGATIVE Final   Leukocytes,Ua 10/18/2023 TRACE (A)  NEGATIVE Final   RBC / HPF 10/18/2023 0-5  0 - 5 RBC/hpf Final   WBC, UA 10/18/2023 0-5  0 - 5 WBC/hpf Final   Bacteria, UA 10/18/2023 MANY (A)  NONE SEEN Final   Squamous Epithelial / HPF 10/18/2023 0-5  0 - 5 /HPF Final   Mucus 10/18/2023 PRESENT   Final   Hyaline Casts, UA 10/18/2023 PRESENT   Final   SARS Coronavirus 2 by RT PCR 10/18/2023 NEGATIVE  NEGATIVE Final   Influenza A by PCR 10/18/2023 NEGATIVE  NEGATIVE Final   Influenza B by PCR 10/18/2023 NEGATIVE  NEGATIVE Final   Resp Syncytial Virus by PCR 10/18/2023 NEGATIVE  NEGATIVE Final  Admission on 08/07/2023, Discharged on 08/07/2023  Component Date Value Ref Range Status   Sodium 08/07/2023 137  135 - 145 mmol/L Final   Potassium 08/07/2023 3.6  3.5 - 5.1 mmol/L Final   Chloride 08/07/2023 100  98 - 111 mmol/L Final   CO2 08/07/2023 31  22 - 32 mmol/L Final   Glucose, Bld 08/07/2023 86  70 - 99 mg/dL Final   BUN 87/83/7975 17  6 - 20 mg/dL Final   Creatinine, Ser 08/07/2023 0.83  0.44 - 1.00 mg/dL Final   Calcium  08/07/2023 9.7  8.9 - 10.3 mg/dL Final   GFR, Estimated 08/07/2023 >60  >60 mL/min Final   Anion gap 08/07/2023 6  5 - 15 Final   WBC 08/07/2023 10.8 (H)  4.0 - 10.5 K/uL Final   RBC 08/07/2023 4.64  3.87 - 5.11 MIL/uL Final   Hemoglobin 08/07/2023 12.8  12.0 - 15.0 g/dL Final   HCT 87/83/7975 38.8  36.0 - 46.0 % Final   MCV 08/07/2023 83.6  80.0 - 100.0 fL Final   MCH 08/07/2023 27.6  26.0 - 34.0 pg Final   MCHC 08/07/2023 33.0  30.0 - 36.0 g/dL Final   RDW 87/83/7975 14.0  11.5 - 15.5 % Final   Platelets 08/07/2023 299  150 - 400 K/uL Final   nRBC 08/07/2023 0.0  0.0 - 0.2 % Final   Troponin I (High Sensitivity) 08/07/2023 10  <18 ng/L Final   B Natriuretic Peptide 08/07/2023 168.1 (H)  0.0 - 100.0 pg/mL Final    D-Dimer, Quant 08/07/2023 1.45 (H)  0.00 - 0.50 ug/mL-FEU Final   Troponin I (High Sensitivity) 08/07/2023 9  <18 ng/L  Final  Office Visit on 07/24/2023  Component Date Value Ref Range Status   Sodium 07/24/2023 135  135 - 145 mEq/L Final   Potassium 07/24/2023 4.1  3.5 - 5.1 mEq/L Final   Chloride 07/24/2023 100  96 - 112 mEq/L Final   CO2 07/24/2023 28  19 - 32 mEq/L Final   Glucose, Bld 07/24/2023 99  70 - 99 mg/dL Final   BUN 87/97/7975 18  6 - 23 mg/dL Final   Creatinine, Ser 07/24/2023 0.87  0.40 - 1.20 mg/dL Final   GFR 87/97/7975 80.07  >60.00 mL/min Final   Calcium  07/24/2023 9.5  8.4 - 10.5 mg/dL Final   Pro B Natriuretic peptide (BNP) 07/24/2023 120.0 (H)  0.0 - 100.0 pg/mL Final   SARS Coronavirus 2 Ag 07/24/2023 Negative  Negative Final  Admission on 07/07/2023, Discharged on 07/09/2023  Component Date Value Ref Range Status   Sodium 07/07/2023 137  135 - 145 mmol/L Final   Potassium 07/07/2023 3.7  3.5 - 5.1 mmol/L Final   Chloride 07/07/2023 102  98 - 111 mmol/L Final   CO2 07/07/2023 25  22 - 32 mmol/L Final   Glucose, Bld 07/07/2023 125 (H)  70 - 99 mg/dL Final   BUN 88/84/7975 20  6 - 20 mg/dL Final   Creatinine, Ser 07/07/2023 0.90  0.44 - 1.00 mg/dL Final   Calcium  07/07/2023 8.9  8.9 - 10.3 mg/dL Final   GFR, Estimated 07/07/2023 >60  >60 mL/min Final   Anion gap 07/07/2023 10  5 - 15 Final   WBC 07/07/2023 17.6 (H)  4.0 - 10.5 K/uL Final   RBC 07/07/2023 4.89  3.87 - 5.11 MIL/uL Final   Hemoglobin 07/07/2023 13.4  12.0 - 15.0 g/dL Final   HCT 88/84/7975 41.0  36.0 - 46.0 % Final   MCV 07/07/2023 83.8  80.0 - 100.0 fL Final   MCH 07/07/2023 27.4  26.0 - 34.0 pg Final   MCHC 07/07/2023 32.7  30.0 - 36.0 g/dL Final   RDW 88/84/7975 13.9  11.5 - 15.5 % Final   Platelets 07/07/2023 288  150 - 400 K/uL Final   nRBC 07/07/2023 0.0  0.0 - 0.2 % Final   Troponin I (High Sensitivity) 07/07/2023 45 (H)  <18 ng/L Final   B Natriuretic Peptide 07/07/2023 468.0 (H)  0.0  - 100.0 pg/mL Final   Troponin I (High Sensitivity) 07/07/2023 41 (H)  <18 ng/L Final   HIV Screen 4th Generation wRfx 07/07/2023 Non Reactive  Non Reactive Final   Weight 07/08/2023 3,566.16  oz Final   Height 07/08/2023 62  in Final   BP 07/08/2023 109/74  mmHg Final   S' Lateral 07/08/2023 2.40  cm Final   Area-P 1/2 07/08/2023 3.13  cm2 Final   Radius 07/08/2023 0.60  cm Final   MV M vel 07/08/2023 4.99  m/s Final   MV Peak grad 07/08/2023 99.6  mmHg Final   Est EF 07/08/2023 70 - 75%   Final   WBC 07/08/2023 13.7 (H)  4.0 - 10.5 K/uL Final   RBC 07/08/2023 4.76  3.87 - 5.11 MIL/uL Final   Hemoglobin 07/08/2023 12.9  12.0 - 15.0 g/dL Final   HCT 88/83/7975 39.1  36.0 - 46.0 % Final   MCV 07/08/2023 82.1  80.0 - 100.0 fL Final   MCH 07/08/2023 27.1  26.0 - 34.0 pg Final   MCHC 07/08/2023 33.0  30.0 - 36.0 g/dL Final   RDW 88/83/7975 13.4  11.5 - 15.5 %  Final   Platelets 07/08/2023 283  150 - 400 K/uL Final   nRBC 07/08/2023 0.0  0.0 - 0.2 % Final   Neutrophils Relative % 07/08/2023 64  % Final   Neutro Abs 07/08/2023 8.6 (H)  1.7 - 7.7 K/uL Final   Lymphocytes Relative 07/08/2023 25  % Final   Lymphs Abs 07/08/2023 3.4  0.7 - 4.0 K/uL Final   Monocytes Relative 07/08/2023 8  % Final   Monocytes Absolute 07/08/2023 1.1 (H)  0.1 - 1.0 K/uL Final   Eosinophils Relative 07/08/2023 3  % Final   Eosinophils Absolute 07/08/2023 0.4  0.0 - 0.5 K/uL Final   Basophils Relative 07/08/2023 0  % Final   Basophils Absolute 07/08/2023 0.0  0.0 - 0.1 K/uL Final   Immature Granulocytes 07/08/2023 0  % Final   Abs Immature Granulocytes 07/08/2023 0.06  0.00 - 0.07 K/uL Final   Magnesium 07/08/2023 2.2  1.7 - 2.4 mg/dL Final   Phosphorus 88/83/7975 4.2  2.5 - 4.6 mg/dL Final   Sodium 88/83/7975 135  135 - 145 mmol/L Final   Potassium 07/08/2023 3.3 (L)  3.5 - 5.1 mmol/L Final   Chloride 07/08/2023 97 (L)  98 - 111 mmol/L Final   CO2 07/08/2023 28  22 - 32 mmol/L Final   Glucose, Bld  07/08/2023 109 (H)  70 - 99 mg/dL Final   BUN 88/83/7975 20  6 - 20 mg/dL Final   Creatinine, Ser 07/08/2023 0.84  0.44 - 1.00 mg/dL Final   Calcium  07/08/2023 9.3  8.9 - 10.3 mg/dL Final   Total Protein 88/83/7975 7.7  6.5 - 8.1 g/dL Final   Albumin 88/83/7975 3.6  3.5 - 5.0 g/dL Final   AST 88/83/7975 14 (L)  15 - 41 U/L Final   ALT 07/08/2023 16  0 - 44 U/L Final   Alkaline Phosphatase 07/08/2023 65  38 - 126 U/L Final   Total Bilirubin 07/08/2023 1.2 (H)  <1.2 mg/dL Final   GFR, Estimated 07/08/2023 >60  >60 mL/min Final   Anion gap 07/08/2023 10  5 - 15 Final   Sodium 07/09/2023 135  135 - 145 mmol/L Final   Potassium 07/09/2023 4.2  3.5 - 5.1 mmol/L Final   Chloride 07/09/2023 101  98 - 111 mmol/L Final   CO2 07/09/2023 27  22 - 32 mmol/L Final   Glucose, Bld 07/09/2023 111 (H)  70 - 99 mg/dL Final   BUN 88/82/7975 17  6 - 20 mg/dL Final   Creatinine, Ser 07/09/2023 0.89  0.44 - 1.00 mg/dL Final   Calcium  07/09/2023 9.2  8.9 - 10.3 mg/dL Final   GFR, Estimated 07/09/2023 >60  >60 mL/min Final   Anion gap 07/09/2023 7  5 - 15 Final   Magnesium 07/09/2023 2.7 (H)  1.7 - 2.4 mg/dL Final  Scanned Document on 05/04/2023  Component Date Value Ref Range Status   HM Mammogram 05/03/2023 0-4 [facility]  0-4 [facility], Self Reported Normal Final  There may be more visits with results that are not included.  No image results found. No results found.       ASSESSMENT & PLAN   Assessment & Plan H/O TIA (transient ischemic attack) and stroke Morbid obesity (HCC) Transient ischemic attack (TIA) is managed with Aspirin  and Atorvastatin  (Lipitor) to reduce the risk of future events. Blood pressure and weight management are also crucial. Continue Aspirin  EC 81 MG daily and Atorvastatin  40 MG daily.  Obesity is managed with Semaglutide  (Wegovy ), resulting in significant  weight loss from 230 lbs to 213 lbs. The medication is well-tolerated despite causing manageable diarrhea. Continue Semaglutide   1.7 MG weekly and increase the dose to 2.4 MG if tolerated. Monitor weight and adjust medications as needed. Hypertrophic cardiomyopathy (HCC) Chronic heart failure with preserved ejection fraction (HCC) Hypertrophic cardiomyopathy is managed with Mavacamten  (Camzyos ), which reduces heart size and improves symptoms. The condition is partly genetic and requires careful management, especially with exercise. Continue Mavacamten  2.5 MG daily. Consult a cardiologist before starting any new exercise program and monitor heart rate closely during exercise using an Apple Watch. Essential hypertension Hypertension is well-controlled with home readings of 123/80 mmHg and office readings of 126/76 mmHg. Current management includes Metoprolol  succinate (Toprol -XL) and Losartan  (Cozaar ). Stress management is important to prevent spikes in blood pressure. Continue Metoprolol  150 MG daily and Losartan  12.5 MG daily. Monitor blood pressure at home and report high readings to the cardiologist. continues on, as of 06/11/2024 Current hypertension medications:       Sig   losartan  (COZAAR ) 25 MG tablet (Taking) Take 0.5 tablets (12.5 mg total) by mouth daily.   metoprolol  succinate (TOPROL -XL) 100 MG 24 hr tablet (Taking) Take 1.5 tablets (150 mg total) by mouth daily. Take with or immediately following a meal.   furosemide  (LASIX ) 40 MG tablet Take 1 tablet (40 mg total) by mouth daily.   mavacamten  (CAMZYOS ) 2.5 MG CAPS capsule Take 1 capsule (2.5 mg total) by mouth daily.   spironolactone  (ALDACTONE ) 25 MG tablet Take 1 tablet (25 mg total) by mouth daily.      Mixed anxiety depressive disorder Adjustment disorder with mixed anxiety and depressed mood is managed with Venlafaxine  XR (Effexor  XR) for better control of hot flashes and mood. Stress from recent life events has contributed to symptoms, but therapy is ongoing and beneficial. Continue Venlafaxine  XR 37.5 MG daily and therapy sessions. Reorder psychiatry  referral for additional support if needed. Snoring Suspected sleep apnea Suspected sleep apnea may be contributing to blood pressure issues. Use the Apple Watch to monitor sleep patterns and detect sleep apnea. Report findings to the provider for further evaluation. Post-menopause atrophic vaginitis Hot flashes, menopausal Vasomotor symptoms (hot flashes) are managed with Fezolinetant  (Veozah ) and Venlafaxine  XR (Effexor  XR). She reports improvement in symptoms and has a 90-day supply of Fezolinetant . Continue Fezolinetant  45 MG daily and Venlafaxine  XR 37.5 MG daily. Had made a personal choice to discontinue estrogen cream in wake of transient ishemic attack (TIA).  Medication management   ORDER ASSOCIATIONS  #   DIAGNOSIS / CONDITION ICD-10 ENCOUNTER ORDER     ICD-10-CM   1. H/O TIA (transient ischemic attack) and stroke  Z86.73 semaglutide -weight management (WEGOVY ) 2.4 MG/0.75ML SOAJ SQ injection    2. Hypertrophic cardiomyopathy (HCC)  I42.2     3. Chronic heart failure with preserved ejection fraction (HCC)  I50.32     4. Essential hypertension  I10     5. Mixed anxiety depressive disorder  F41.8 Ambulatory referral to Psychiatry    6. Snoring  R06.83     7. Suspected sleep apnea  R29.818     8. Morbid obesity (HCC)  E66.01     9. 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 Hot flashes, menopausal  N95.1          Orders Placed in Encounter:    Meds ordered this encounter  Medications   semaglutide -weight management (WEGOVY ) 2.4 MG/0.75ML SOAJ SQ injection    Sig: Inject 2.4 mg  into the skin once a week.    Dispense:  3 mL    Refill:  11    Orders Placed This Encounter  Procedures   Ambulatory referral to Psychiatry    Referral Priority:   Routine    Referral Reason:   Specialty Services Required    Number of Visits Requested:   1       This document was synthesized by artificial intelligence (Abridge) using HIPAA-compliant recording of the clinical  interaction;   We discussed the use of AI scribe software for clinical note transcription with the patient, who gave verbal consent to proceed. additional Info: This encounter employed state-of-the-art, real-time, collaborative documentation. The patient actively reviewed and assisted in updating their electronic medical record on a shared screen, ensuring transparency and facilitating joint problem-solving for the problem list, overview, and plan. This approach promotes accurate, informed care. The treatment plan was discussed and reviewed in detail, including medication safety, potential side effects, and all patient questions. We confirmed understanding and comfort with the plan. Follow-up instructions were established, including contacting the office for any concerns, returning if symptoms worsen, persist, or new symptoms develop, and precautions for potential emergency department visits.

## 2024-06-11 NOTE — Assessment & Plan Note (Signed)
 Suspected sleep apnea may be contributing to blood pressure issues. Use the Apple Watch to monitor sleep patterns and detect sleep apnea. Report findings to the provider for further evaluation.

## 2024-06-12 ENCOUNTER — Other Ambulatory Visit: Payer: Self-pay

## 2024-06-14 ENCOUNTER — Encounter: Admitting: Gastroenterology

## 2024-06-17 ENCOUNTER — Other Ambulatory Visit: Payer: Self-pay

## 2024-06-17 ENCOUNTER — Ambulatory Visit: Admitting: Internal Medicine

## 2024-06-20 ENCOUNTER — Other Ambulatory Visit: Payer: Self-pay

## 2024-06-25 ENCOUNTER — Ambulatory Visit (HOSPITAL_COMMUNITY)
Admission: RE | Admit: 2024-06-25 | Discharge: 2024-06-25 | Disposition: A | Discharge status: Home / Self Care | Source: Ambulatory Visit | Attending: Cardiology | Admitting: Cardiology

## 2024-06-25 DIAGNOSIS — I421 Obstructive hypertrophic cardiomyopathy: Secondary | ICD-10-CM

## 2024-06-25 LAB — ECHOCARDIOGRAM LIMITED
AR max vel: 2.6 cm2
AV Area VTI: 2.55 cm2
AV Area mean vel: 2.41 cm2
AV Mean grad: 6 mmHg
AV Peak grad: 10.8 mmHg
Ao pk vel: 1.65 m/s
Area-P 1/2: 3.15 cm2
Est EF: >75
MV M vel: 5.05 m/s
MV Peak grad: 102 mmHg
Radius: 1 cm
S' Lateral: 1.9 cm

## 2024-06-26 ENCOUNTER — Other Ambulatory Visit: Payer: Self-pay

## 2024-06-26 ENCOUNTER — Ambulatory Visit: Payer: Self-pay | Admitting: Internal Medicine

## 2024-06-26 DIAGNOSIS — I421 Obstructive hypertrophic cardiomyopathy: Secondary | ICD-10-CM

## 2024-06-27 ENCOUNTER — Other Ambulatory Visit: Payer: Self-pay

## 2024-06-28 MED ORDER — MAVACAMTEN 5 MG PO CAPS: 5.0000 mg | ORAL_CAPSULE | Freq: Every day | ORAL | 0 refills | Status: DC

## 2024-06-28 NOTE — Telephone Encounter (Signed)
 Called patient. Patient stated the last two weeks she has been having SOB and sharp chest pain with activity, but it goes away with rest. Patient also states she has been having elevated BP due to having some stress with losing husband back in the summer and her son acting up. Patient stated he SBP is in the 140's. Patient also stated she started taking black seed oil supplement.   Called Dr. Santo about patient's SOB and chest pain episode with activity. He advised for patient to increase her camzyos  to 5 mg daily and follow-up with him or Orren Fabry PA in December. She will also need echo in 4 weeks and 12 weeks with dose change. Sent in refill for Camzyos  5 mg daily. Order placed for echos and made patient an appointment on December 5th in hopes she can get echo the same day.

## 2024-07-03 ENCOUNTER — Other Ambulatory Visit: Payer: Self-pay

## 2024-07-03 ENCOUNTER — Other Ambulatory Visit (HOSPITAL_COMMUNITY): Payer: Self-pay

## 2024-07-03 ENCOUNTER — Telehealth: Payer: Self-pay

## 2024-07-03 NOTE — Telephone Encounter (Signed)
 Pharmacy Patient Advocate Encounter   Received notification from Onbase that prior authorization for Wegovy  2.4MG /0.75ML auto-injectors is required/requested.   Insurance verification completed.   The patient is insured through [facility] .    Per test claim, medication is not covered due to plan/benefit exclusion, PA not submitted at this time

## 2024-07-04 DIAGNOSIS — I422 Other hypertrophic cardiomyopathy: Secondary | ICD-10-CM

## 2024-07-04 DIAGNOSIS — I2721 Secondary pulmonary arterial hypertension: Secondary | ICD-10-CM

## 2024-07-04 DIAGNOSIS — R110 Nausea: Secondary | ICD-10-CM

## 2024-07-04 DIAGNOSIS — Z8673 Personal history of transient ischemic attack (TIA), and cerebral infarction without residual deficits: Secondary | ICD-10-CM

## 2024-07-04 DIAGNOSIS — I5032 Chronic diastolic (congestive) heart failure: Secondary | ICD-10-CM

## 2024-07-05 NOTE — Telephone Encounter (Signed)
 Tried to call pt back about medication had to leave message advise pt to check with pharmacy about medication to see which one she can afford also to check with her insurance and give our office call once she is able to .

## 2024-07-05 NOTE — Telephone Encounter (Signed)
 Pt is calling her insurance to see what they cover as of yesterday when I sent her mychart message'

## 2024-07-08 ENCOUNTER — Other Ambulatory Visit (HOSPITAL_COMMUNITY): Payer: Self-pay

## 2024-07-08 ENCOUNTER — Other Ambulatory Visit: Payer: Self-pay

## 2024-07-08 MED ORDER — WEGOVY 1 MG/0.5ML ~~LOC~~ SOAJ
1.0000 mg | SUBCUTANEOUS | 4 refills | Status: DC
  Filled 2024-07-08 – 2024-07-15 (×5): qty 2, 28d supply, fill #0

## 2024-07-08 NOTE — Telephone Encounter (Signed)
 PA submitted - KEY [id number]

## 2024-07-08 NOTE — Telephone Encounter (Signed)
 Sent to the PA team waiting on replied

## 2024-07-10 ENCOUNTER — Other Ambulatory Visit: Payer: Self-pay

## 2024-07-10 MED ORDER — ONDANSETRON 4 MG PO TBDP
4.0000 mg | ORAL_TABLET | Freq: Three times a day (TID) | ORAL | 2 refills | Status: DC | PRN
  Filled 2024-07-10: qty 20, 7d supply, fill #0

## 2024-07-10 NOTE — Telephone Encounter (Signed)
 Pharmacy Patient Advocate Encounter  Received notification from [facility] that Prior Authorization for Wegovy  2.4MG /0.75ML auto-injectors has been APPROVED from 07/10/24 to 01/06/25   PA #/Case ID/Reference #: [id number]

## 2024-07-11 ENCOUNTER — Other Ambulatory Visit: Payer: Self-pay

## 2024-07-12 ENCOUNTER — Other Ambulatory Visit: Payer: Self-pay

## 2024-07-15 ENCOUNTER — Other Ambulatory Visit: Payer: Self-pay

## 2024-07-15 ENCOUNTER — Ambulatory Visit: Admitting: Internal Medicine

## 2024-07-15 ENCOUNTER — Encounter (HOSPITAL_COMMUNITY): Payer: Self-pay

## 2024-07-16 ENCOUNTER — Other Ambulatory Visit (HOSPITAL_COMMUNITY): Payer: Self-pay

## 2024-07-17 ENCOUNTER — Other Ambulatory Visit: Payer: Self-pay

## 2024-07-25 ENCOUNTER — Ambulatory Visit (HOSPITAL_COMMUNITY)
Admission: RE | Admit: 2024-07-25 | Discharge: 2024-07-25 | Disposition: A | Source: Ambulatory Visit | Attending: Internal Medicine | Admitting: Internal Medicine

## 2024-07-25 DIAGNOSIS — I421 Obstructive hypertrophic cardiomyopathy: Secondary | ICD-10-CM | POA: Diagnosis present

## 2024-07-25 LAB — ECHOCARDIOGRAM COMPLETE
Area-P 1/2: 2.63 cm2
S' Lateral: 2.54 cm

## 2024-07-26 ENCOUNTER — Ambulatory Visit: Attending: Internal Medicine | Admitting: Internal Medicine

## 2024-07-26 ENCOUNTER — Other Ambulatory Visit: Payer: Self-pay

## 2024-07-26 VITALS — BP 115/76 | HR 87 | Ht 63.0 in | Wt 209.0 lb

## 2024-07-26 DIAGNOSIS — I471 Supraventricular tachycardia, unspecified: Secondary | ICD-10-CM | POA: Diagnosis not present

## 2024-07-26 DIAGNOSIS — I422 Other hypertrophic cardiomyopathy: Secondary | ICD-10-CM

## 2024-07-26 DIAGNOSIS — I421 Obstructive hypertrophic cardiomyopathy: Secondary | ICD-10-CM | POA: Diagnosis not present

## 2024-07-26 DIAGNOSIS — Z79899 Other long term (current) drug therapy: Secondary | ICD-10-CM

## 2024-07-26 DIAGNOSIS — I5032 Chronic diastolic (congestive) heart failure: Secondary | ICD-10-CM

## 2024-07-26 DIAGNOSIS — Z8673 Personal history of transient ischemic attack (TIA), and cerebral infarction without residual deficits: Secondary | ICD-10-CM

## 2024-07-26 DIAGNOSIS — I10 Essential (primary) hypertension: Secondary | ICD-10-CM

## 2024-07-26 MED ORDER — FUROSEMIDE 40 MG PO TABS
40.0000 mg | ORAL_TABLET | Freq: Every day | ORAL | 3 refills | Status: DC
  Filled 2024-07-26 – 2024-08-26 (×4): qty 90, 90d supply, fill #0

## 2024-07-26 MED ORDER — ATORVASTATIN CALCIUM 40 MG PO TABS
40.0000 mg | ORAL_TABLET | Freq: Every day | ORAL | 3 refills | Status: AC
  Filled 2024-07-26 – 2024-08-19 (×2): qty 90, 90d supply, fill #0

## 2024-07-26 MED ORDER — SPIRONOLACTONE 25 MG PO TABS
25.0000 mg | ORAL_TABLET | Freq: Every day | ORAL | 3 refills | Status: AC
  Filled 2024-07-26 – 2024-07-30 (×3): qty 90, 90d supply, fill #0
  Filled 2024-07-31: qty 30, 30d supply, fill #0
  Filled 2024-08-01 – 2024-09-20 (×14): qty 90, 90d supply, fill #0

## 2024-07-26 MED ORDER — METOPROLOL SUCCINATE ER 100 MG PO TB24
150.0000 mg | ORAL_TABLET | Freq: Every day | ORAL | 3 refills | Status: AC
  Filled 2024-07-26: qty 45, 30d supply, fill #0
  Filled 2024-07-29 – 2024-07-30 (×2): qty 135, 90d supply, fill #0
  Filled 2024-07-31: qty 45, 30d supply, fill #0
  Filled 2024-08-01 – 2024-09-19 (×7): qty 135, 90d supply, fill #0

## 2024-07-26 NOTE — Patient Instructions (Signed)
 Medication Instructions:  Your physician has recommended you make the following change in your medication:   ** Stop Losartan   *If you need a refill on your cardiac medications before your next appointment, please call your pharmacy*  Lab Work: BMET in 2 weeks If you have labs (blood work) drawn today and your tests are completely normal, you will receive your results only by: MyChart Message (if you have MyChart) OR A paper copy in the mail If you have any lab test that is abnormal or we need to change your treatment, we will call you to review the results.  Testing/Procedures: Please schedule for 6 months Your physician has requested that you have an echocardiogram. Echocardiography is a painless test that uses sound waves to create images of your heart. It provides your doctor with information about the size and shape of your heart and how well your heart's chambers and valves are working. This procedure takes approximately one hour. There are no restrictions for this procedure. Please do NOT wear cologne, perfume, aftershave, or lotions (deodorant is allowed). Please arrive 15 minutes prior to your appointment time.  Please note: We ask at that you not bring children with you during ultrasound (echo/ vascular) testing. Due to room size and safety concerns, children are not allowed in the ultrasound rooms during exams. Our front office staff cannot provide observation of children in our lobby area while testing is being conducted. An adult accompanying a patient to their appointment will only be allowed in the ultrasound room at the discretion of the ultrasound technician under special circumstances. We apologize for any inconvenience.   Follow-Up: At [facility], you and your health needs are our priority.  As part of our continuing mission to provide you with exceptional heart care, our providers are all part of one team.  This team includes your primary Cardiologist (physician)  and Advanced Practice Providers or APPs (Physician Assistants and Nurse Practitioners) who all work together to provide you with the care you need, when you need it.  Your next appointment:   9 months with Dr Santo or Orren Fabry, PA_C

## 2024-07-26 NOTE — Progress Notes (Signed)
 Cardiology Office Note:  .    Date:  07/26/2024  ID:  Lydia Thompson, DOB [birth year], MRN [id number] PCP: Jesus Bernardino MATSU, MD  [facility] Providers Cardiologist:  Newman JINNY Lawrence, MD     CC: f/u HCM  History of Present Illness: .    Lydia Thompson is a 48 y.o. female with obstructive hypertrophic cardiomyopathy who presents for follow-up of her condition.  She has a history of obstructive hypertrophic cardiomyopathy with a recent echocardiogram showing an ejection fraction of 65% and a strain of -18.9. The LVOT gradient was measured at 34. She has experienced improvement in symptoms, including reduced shortness of breath and chest pain during physical activities such as walking and swimming. She reports improved exercise tolerance.  She has lost weight, currently weighing 203 pounds, down from 230 pounds, which she attributes to the use of Wegovy . She has not started any new medications recently.  She experiences difficulty sleeping and occasional depressive moods, especially after the passing of her husband in July. She is managing her responsibilities, including caring for her son and her dogs, which adds to her stress. She is not currently taking any medication for anxiety or depression.   Relevant histories: .  Social  - has one son, husband had Lung CA; no biologic children or siblings ROS: As per HPI.   Studies Reviewed: .     Cardiac Studies & Procedures   ______________________________________________________________________________________________     ECHOCARDIOGRAM  ECHOCARDIOGRAM COMPLETE 07/25/2024  Narrative ECHOCARDIOGRAM REPORT    Patient Name:   Lydia Thompson Date of Exam: 07/25/2024 Medical Rec #:  [id number]     Height:       63.0 in Accession #:    [id number]    Weight:       217.4 lb Date of Birth:  05-10-1976    BSA:          2.003 m Patient Age:    48 years      BP:           100/70 mmHg Patient Gender: F             HR:           72  bpm. Exam Location:  [address]  Procedure: 2D Echo, 3D Echo and Strain Analysis (Both Spectral and Color Flow Doppler were utilized during procedure).  Indications:    I42.2 hypertrophic obstructive cardiomyopathy (Mevacamtem 5mg )  History:        Patient has prior history of Echocardiogram examinations, most recent 06/25/2024. CHF, TIA, Signs/Symptoms:Shortness of Breath and Chest Pain; Risk Factors:Hypertension. SVT. Dizziness.  Sonographer:    Jon Hacker RCS Referring Phys: [id number] [facility] A Beverlie Kurihara  IMPRESSIONS   1. There is a a mid-cavitary gradient. Peak velocity 1.62 m/s. Peak gradient 10 mmHg. With Valsalva this increases to 2.9 m/s and 34 mmHg. Left ventricular ejection fraction, by estimation, is 65 to 70%. The left ventricle has normal function. The left ventricle has no regional wall motion abnormalities. There is moderate asymmetric left ventricular hypertrophy of the septal segment. Left ventricular diastolic parameters are consistent with Grade I diastolic dysfunction (impaired relaxation). The average left ventricular global longitudinal strain is -18.9 %. 2. Right ventricular systolic function is normal. The right ventricular size is normal. 3. The mitral valve is normal in structure. Trivial mitral valve regurgitation. No evidence of mitral stenosis. 4. The aortic valve is tricuspid. Aortic valve regurgitation is not visualized. No aortic stenosis is present. 5.  The inferior vena cava is normal in size with greater than 50% respiratory variability, suggesting right atrial pressure of 3 mmHg.  FINDINGS Left Ventricle: There is a a mid-cavitary gradient. Peak velocity 1.62 m/s. Peak gradient 10 mmHg. With Valsalva this increases to 2.9 m/s and 34 mmHg. Left ventricular ejection fraction, by estimation, is 65 to 70%. The left ventricle has normal function. The left ventricle has no regional wall motion abnormalities. The average left ventricular global  longitudinal strain is -18.9 %. 3D ejection fraction reviewed and evaluated as part of the interpretation. Alternate measurement of EF is felt to be most reflective of LV function. The left ventricular internal cavity size was normal in size. There is moderate asymmetric left ventricular hypertrophy of the septal segment. Left ventricular diastolic parameters are consistent with Grade I diastolic dysfunction (impaired relaxation). Indeterminate filling pressures.  Right Ventricle: The right ventricular size is normal. No increase in right ventricular wall thickness. Right ventricular systolic function is normal.  Left Atrium: Left atrial size was normal in size.  Right Atrium: Right atrial size was normal in size.  Pericardium: There is no evidence of pericardial effusion.  Mitral Valve: The mitral valve is normal in structure. Trivial mitral valve regurgitation. No evidence of mitral valve stenosis.  Tricuspid Valve: The tricuspid valve is normal in structure. Tricuspid valve regurgitation is not demonstrated. No evidence of tricuspid stenosis.  Aortic Valve: The aortic valve is tricuspid. Aortic valve regurgitation is not visualized. No aortic stenosis is present.  Pulmonic Valve: The pulmonic valve was normal in structure. Pulmonic valve regurgitation is trivial. No evidence of pulmonic stenosis.  Aorta: The aortic root is normal in size and structure.  Venous: The inferior vena cava is normal in size with greater than 50% respiratory variability, suggesting right atrial pressure of 3 mmHg.  IAS/Shunts: No atrial level shunt detected by color flow Doppler.  Additional Comments: 3D was performed not requiring image post processing on an independent workstation and was normal.   LEFT VENTRICLE PLAX 2D LVIDd:         3.87 cm   Diastology LVIDs:         2.54 cm   LV e' medial:    5.22 cm/s LV PW:         1.01 cm   LV E/e' medial:  12.9 LV IVS:        1.60 cm   LV e' lateral:   12.70  cm/s LVOT diam:     2.00 cm   LV E/e' lateral: 5.3 LVOT Area:     3.14 cm 2D Longitudinal Strain 2D Strain GLS (A4C):   -20.2 % 2D Strain GLS (A3C):   -18.2 % 2D Strain GLS (A2C):   -18.2 % 2D Strain GLS Avg:     -18.9 %  3D Volume EF: 3D EF:        56 % LV EDV:       149 ml LV ESV:       66 ml LV SV:        83 ml  RIGHT VENTRICLE RV Basal diam:  3.15 cm RV S prime:     13.70 cm/s TAPSE (M-mode): 2.2 cm  LEFT ATRIUM             Index        RIGHT ATRIUM           Index LA diam:        4.90 cm 2.45 cm/m   RA  Area:     10.40 cm LA Vol (A2C):   43.8 ml 21.86 ml/m  RA Volume:   19.10 ml  9.53 ml/m LA Vol (A4C):   74.8 ml 37.34 ml/m LA Biplane Vol: 57.8 ml 28.85 ml/m  AORTA Ao Root diam: 3.00 cm Ao Asc diam:  3.60 cm  MITRAL VALVE MV Area (PHT): 2.63 cm    SHUNTS MV Decel Time: 288 msec    Systemic Diam: 2.00 cm MV E velocity: 67.10 cm/s MV A velocity: 77.00 cm/s MV E/A ratio:  0.87  Annabella Scarce MD Electronically signed by Annabella Scarce MD Signature Date/Time: 07/25/2024/4:28:26 PM    Final    MONITORS  LONG TERM MONITOR (3-14 DAYS) 08/30/2023  Narrative Zio patch monitor 13 days 08/03/2023 - 08/17/2023: Dominant rhythm: Sinus. HR 50-121 bpm. Avg HR 74 bpm, in sinus rhythm. 3 episodes of atrial tachycardia, fastest at 154 bpm for 5 beats, longest for 5 beats at 140 bpm. <1% isolated SVE, couplet/triplets. 1 episode of VT, fastest at 182 bpm for 5 bears. <1% isolated VE, couplets. No atrial fibrillation/atrial flutter/VT/high grade AV block, sinus pause >3sec noted. 14 patient triggered events, most correlated with SVE, VE.   CT SCANS  CT CORONARY MORPH W/CTA COR W/SCORE 10/17/2018  Addendum 10/17/2018 12:09 PM ADDENDUM REPORT: 10/17/2018 12:07  CLINICAL DATA:  48 year old female with h/o hypertension, obesity, chest pain and abnormal treadmill stress test.  EXAM: Cardiac/Coronary  CT  TECHNIQUE: The patient was scanned on a [employer].  FINDINGS: A 120 kV prospective scan was triggered in the descending thoracic aorta at 111 HU's. Axial non-contrast 3 mm slices were carried out through the heart. The data set was analyzed on a dedicated work station and scored using the Agatson method. Gantry rotation speed was 250 msecs and collimation was .6 mm. No beta blockade and 0.8 mg of sl NTG was given. The 3D data set was reconstructed in 5% intervals of the 67-82 % of the R-R cycle. Diastolic phases were analyzed on a dedicated work station using MPR, MIP and VRT modes. The patient received 80 cc of contrast.  Aorta:  Normal size.  No calcifications.  No dissection.  Aortic Valve:  Trileaflet.  No calcifications.  Coronary Arteries:  Normal coronary origin.  Right dominance.  RCA is a large dominant artery that gives rise to PDA and PLVB. There is no plaque.  Left main is a large artery that gives rise to LAD and LCX arteries. Left main has no plaque.  LAD is a large vessel that wraps around the apex and has no plaque.  LCX is a non-dominant artery that gives rise to one large OM1 branch. There is no plaque.  Other findings:  Normal pulmonary vein drainage into the left atrium.  Normal let atrial appendage without a thrombus.  IMPRESSION: 1. Coronary calcium  score of 0. This was 0 percentile for age and sex matched control.  2. Normal coronary origin with right dominance.  3. No evidence of CAD.  4. Moderately dilated pulmonary artery measuring 36 mm suggestive of pulmonary hypertension.   Electronically Signed By: Leim Moose On: 10/17/2018 12:07  Narrative EXAM: OVER-READ INTERPRETATION  CT CHEST  The following report is an over-read performed by radiologist Dr. Marcey Moan of [facility] on 10/17/2018. This over-read does not include interpretation of cardiac or coronary anatomy or pathology. The coronary CTA interpretation by the cardiologist is  attached.  COMPARISON:  None.  FINDINGS: Vascular: Dilated central pulmonary  arteries with the main pulmonary artery segment measuring as much as 3.4 cm.  Mediastinum/Nodes: Visualized mediastinum and hilar regions show no evidence of lymphadenopathy or masses.  Lungs/Pleura: Minimal atelectasis at both lung bases. Visualized lungs show no evidence of pulmonary edema, consolidation, pneumothorax, nodule or pleural fluid.  Upper Abdomen: Probable small hiatal hernia.  Musculoskeletal: No chest wall mass or suspicious bone lesions identified.  IMPRESSION: 1. Enlarged central pulmonary arteries are suggestive of underlying pulmonary hypertension. 2. Probable small hiatal hernia.  Electronically Signed: By: Marcey Moan M.D. On: 10/17/2018 09:40   CARDIAC MRI  MR CARDIAC MORPHOLOGY W WO CONTRAST 07/26/2023  Narrative CLINICAL DATA:  HCM  EXAM: CARDIAC MRI  TECHNIQUE: The patient was scanned on a 1.5 Tesla Siemens magnet. A dedicated cardiac coil was used. Functional imaging was done using Fiesta sequences. 2,3, and 4 chamber views were done to assess for RWMA's. Modified Simpson's rule using a short axis stack was used to calculate an ejection fraction on a dedicated work [employer]. The patient received 13 cc of Gadavist . After 10 minutes inversion recovery sequences were used to assess for infiltration and scar tissue. Velocity flow mapping performed in the ascending aorta and main pulmonary artery.  CONTRAST:  13 cc  of Gadavist   FINDINGS: 1. Normal left ventricular size, Severe asymmetric LV basal-septal wall thickness, measuring upto 1.8cm. Normal systolic function (LVEF = 57%).  There are no regional wall motion abnormalities.  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  There is no late gadolinium enhancement in the left ventricular myocardium.  LVEDV: 159 ml  LVESV: 69 ml  SV: 90  ml  CO: 5L/min  Myocardial mass: 145g  2. Normal right ventricular size, thickness and systolic function (RVEF = 54%). There are no regional wall motion abnormalities.  3.  Moderately dilated left atrial size, normal right atrial size.  4. Normal size of the aortic root, ascending aorta and pulmonary artery.  5. Mild mitral regurgitation. no significant valvular abnormalities.  6.  Normal pericardium.  No pericardial effusion.  IMPRESSION: 1. Normal LV size and systolic function, LVEF = 57%.  2. Severe asymmetric LV basal-septal wall thickness, measuring upto 1.8 cm.  3. 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  4. There is no late gadolinium enhancement in the left ventricular myocardium.  5. Normal RV function.  6. 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   Electronically Signed By: Redell Cave M.D. On: 07/26/2023 18:17   ______________________________________________________________________________________________       Physical Exam:    VS:  BP 115/76   Pulse 87   Ht 5' 3 (1.6 m)   Wt 209 lb (94.8 kg)   SpO2 97%   BMI 37.02 kg/m    Wt Readings from Last 3 Encounters:  07/26/24 209 lb (94.8 kg)  06/11/24 217 lb 6.4 oz (98.6 kg)  04/19/24 220 lb 6.4 oz (100 kg)    Gen: no distress, morbid obesity  Neck: No JVD  Cardiac: No Rubs or Gallops, standing systolic murmur, RRR +2 radial pulses Respiratory: Clear to auscultation bilaterally, normal effort, normal  respiratory rate GI: Soft, nontender, non-distended  MS: No edema;  moves all extremities Integument: Skin feels warm Neuro:  At time of evaluation, alert and oriented to person/place/time/situation  Psych: Normal affect, patient feels ok   ASSESSMENT AND PLAN: .    Hypertrophic Cardiomyopathy - Maximal thickness 18 mm, hx of oHCM with LVOT  gradient 34 mm HG - on therapy NYHA I - hx of TIA by hx only with hx of  asymptomatic SVT; no evidence of AF or AFL; HCM AF score 16 - Hypertrophic cardiomyopathy with sudden cardiac death of 1.90 (Class III indication) - CMI Therapy: Reaching out to industry; because of chest pain her echo was moved up by my team but it was too early, because of this it may not allow us  to submit for REMS - we will stop her losartan , and if BMP if doing well, may decrease some other not CMI medications - Genetic testing deferred (no biologic relatives)  HLD - no change to thearpy  Obesity - on GLP1 RA therapy continues to improve with sx improvement and weight loss  Longitudinal care: The evaluation and management services provided today reflect the complexity inherent in caring for this patient, including the ongoing longitudinal relationship and management of multiple chronic conditions and/or the need for care coordination. The visit required a comprehensive assessment and management plan tailored to the patient's unique needs Time was spent addressing not only the acute concerns but also the broader context of the patient's health, including preventive care, chronic disease management, and care coordination as appropriate.  Complex longitudinal is necessary for conditions including:    Stanly Leavens, MD FASE [facility] Cardiologist [facility]  [address] [place], [address] [place], [place] [address] [phone]  5:32 PM

## 2024-07-29 ENCOUNTER — Other Ambulatory Visit: Payer: Self-pay

## 2024-07-30 ENCOUNTER — Other Ambulatory Visit: Payer: Self-pay

## 2024-07-31 ENCOUNTER — Other Ambulatory Visit: Payer: Self-pay

## 2024-08-01 ENCOUNTER — Other Ambulatory Visit: Payer: Self-pay

## 2024-08-01 ENCOUNTER — Encounter (HOSPITAL_COMMUNITY): Payer: Self-pay

## 2024-08-02 ENCOUNTER — Other Ambulatory Visit: Payer: Self-pay | Admitting: Internal Medicine

## 2024-08-05 ENCOUNTER — Other Ambulatory Visit: Payer: Self-pay

## 2024-08-05 ENCOUNTER — Telehealth: Payer: Self-pay

## 2024-08-05 NOTE — Telephone Encounter (Unsigned)
 Copied from CRM #[id number]. Topic: Clinical - Medication Question >> Aug 05, 2024 11:26 AM Thersia BROCKS wrote: Reason for CRM: Patient wants to know if there is something prescribe for her to sleep, Patient husband has pass recently and she has been having trouble sleeping, would like Dr.Morrison or nurse to give her a callback regarding this

## 2024-08-05 NOTE — Telephone Encounter (Signed)
 Copied from CRM #[id number]. Topic: Clinical - Medication Question >> Aug 05, 2024 11:26 AM Thersia BROCKS wrote: Reason for CRM: Patient wants to know if there is something prescribe for her to sleep, Patient husband has pass recently and she has been having trouble sleeping, would like Dr.Morrison or nurse to give her a callback regarding this  Sent pt message via mychart to make appt per provider Virtual appt or in house appt

## 2024-08-07 ENCOUNTER — Ambulatory Visit: Payer: Self-pay | Admitting: Internal Medicine

## 2024-08-07 ENCOUNTER — Other Ambulatory Visit: Payer: Self-pay

## 2024-08-07 ENCOUNTER — Telehealth

## 2024-08-07 DIAGNOSIS — G479 Sleep disorder, unspecified: Secondary | ICD-10-CM | POA: Diagnosis not present

## 2024-08-07 DIAGNOSIS — F4321 Adjustment disorder with depressed mood: Secondary | ICD-10-CM

## 2024-08-07 LAB — BASIC METABOLIC PANEL WITH GFR
BUN/Creatinine Ratio: 10 (ref 9–23)
BUN: 8 mg/dL (ref 6–24)
CO2: 25 mmol/L (ref 20–29)
Calcium: 9.3 mg/dL (ref 8.7–10.2)
Chloride: 101 mmol/L (ref 96–106)
Creatinine, Ser: 0.78 mg/dL (ref 0.57–1.00)
Glucose: 85 mg/dL (ref 70–99)
Potassium: 3.7 mmol/L (ref 3.5–5.2)
Sodium: 139 mmol/L (ref 134–144)
eGFR: 94 mL/min/1.73 (ref >59)

## 2024-08-07 MED ORDER — TRAZODONE HCL 50 MG PO TABS
25.0000 mg | ORAL_TABLET | Freq: Every evening | ORAL | 0 refills | Status: AC | PRN
  Filled 2024-08-07: qty 30, 30d supply, fill #0

## 2024-08-07 NOTE — Telephone Encounter (Signed)
 Pending echo on 12/24. Last dispensed 11/25

## 2024-08-07 NOTE — Progress Notes (Signed)
 Virtual Visit Consent   Lydia Thompson, you are scheduled for a virtual visit with a [facility] provider today. Just as with appointments in the office, your consent must be obtained to participate. Your consent will be active for this visit and any virtual visit you may have with one of our providers in the next 365 days. If you have a MyChart account, a copy of this consent can be sent to you electronically.  As this is a virtual visit, video technology does not allow for your provider to perform a traditional examination. This may limit your provider's ability to fully assess your condition. If your provider identifies any concerns that need to be evaluated in person or the need to arrange testing (such as labs, EKG, etc.), we will make arrangements to do so. Although advances in technology are sophisticated, we cannot ensure that it will always work on either your end or our end. If the connection with a video visit is poor, the visit may have to be switched to a telephone visit. With either a video or telephone visit, we are not always able to ensure that we have a secure connection.  By engaging in this virtual visit, you consent to the provision of healthcare and authorize for your insurance to be billed (if applicable) for the services provided during this visit. Depending on your insurance coverage, you may receive a charge related to this service.  I need to obtain your verbal consent now. Are you willing to proceed with your visit today? Lydia Thompson has provided verbal consent on 08/07/2024 for a virtual visit (video or telephone). Delon CHRISTELLA Dickinson, PA-C  Date: 08/07/2024 11:13 AM   Virtual Visit via Video Note   I, Delon CHRISTELLA Dickinson, connected with  Lydia Thompson  ([id number], August 26, 1975) on 08/07/2024 at 10:00 AM EST by a video-enabled telemedicine application and verified that I am speaking with the correct person using two identifiers.  Location: Patient: Virtual Visit  Location Patient: Home Provider: Virtual Visit Location Provider: Home Office   I discussed the limitations of evaluation and management by telemedicine and the availability of in person appointments. The patient expressed understanding and agreed to proceed.    History of Present Illness: Lydia Thompson is a 48 y.o. who identifies as a female who was assigned female at birth, and is being seen today for difficulty sleeping. Her husband passed in July from lung cancer at the age of 42. She is also dealing with HOCM and heart failure, but has been doing well over the last few months.   Of recent she has had issues sleeping, but it has been worsening as it has gotten closer to the [place]. She has tried OTC options with Melatonin and Benadryl without relief.    Problems:  Patient Active Problem List   Diagnosis Date Noted   Suspected sleep apnea 06/11/2024   Bereavement 04/19/2024   Gastroesophageal reflux disease without esophagitis 09/25/2023   Hot flashes 09/06/2023   Cysts of both ovaries 09/06/2023   Hemoptysis 08/07/2023   SVT (supraventricular tachycardia) 07/08/2023   Hypokalemia 07/08/2023   Acute respiratory failure with hypoxia (HCC) 07/07/2023   Mixed anxiety depressive disorder 10/06/2022   Dizziness 08/30/2022   Pulmonary artery hypertension (HCC) 08/30/2022   Hot flashes, menopausal 08/30/2022   Low libido 08/30/2022   Nausea 08/30/2022   Diastolic dysfunction 08/30/2022   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  Resting comfortably at home.  Head is  normocephalic, atraumatic.  No labored breathing.  Speech is clear and coherent with logical content.  Patient is alert and oriented at baseline.    Assessment and Plan: 1. Grief (Primary) - traZODone  (DESYREL ) 50 MG tablet; Take 0.5-1 tablets (25-50 mg total) by mouth at bedtime as needed for sleep.  Dispense: 30 tablet; Refill: 0  2. Difficulty sleeping - traZODone  (DESYREL ) 50 MG tablet; Take 0.5-1 tablets (25-50 mg total) by mouth at bedtime as needed for sleep.  Dispense: 30 tablet; Refill: 0  - Will add Trazodone  for PRN use - Continue working on sleep hygiene - Follow up with PCP  Follow Up Instructions: I discussed the assessment and treatment plan with the patient. The patient was provided an opportunity to ask questions and all were answered. The patient agreed with the plan and demonstrated an understanding of the instructions.  A copy of instructions were sent to the patient via MyChart unless otherwise noted below.    The patient was advised to call back or seek an in-person evaluation if the symptoms worsen or if the condition fails to improve as anticipated.    Delon CHRISTELLA Dickinson, PA-C     [1]  Allergies Allergen Reactions   Gabapentin      Caused edema and weight gain  [2]  Current Outpatient Medications:    traZODone  (DESYREL ) 50 MG tablet, Take 0.5-1 tablets (25-50 mg total) by mouth at bedtime as needed for sleep., Disp: 30 tablet, Rfl: 0   aspirin  EC 81 MG tablet, Take 1 tablet (81 mg total) by mouth daily. Swallow whole., Disp: 90 tablet, Rfl: 3   atorvastatin  (LIPITOR) 40 MG tablet, Take 1 tablet (40 mg total) by mouth daily., Disp: 90 tablet, Rfl: 3   BLACK CURRANT SEED OIL PO, Take 2 tablets by mouth daily., Disp: , Rfl:    budesonide -formoterol  (SYMBICORT ) 160-4.5 MCG/ACT inhaler, Inhale 2 puffs into the lungs in the morning and at bedtime. (Patient taking differently: Inhale 2 puffs into the lungs as needed.), Disp: 10.2 g, Rfl: 12   escitalopram   (LEXAPRO ) 10 MG tablet, Take 10 mg by mouth daily., Disp: , Rfl:    Fezolinetant  (VEOZAH ) 45 MG TABS, Take 1 tablet (45 mg total) by mouth daily at 6 (six) AM., Disp: 90 tablet, Rfl: 4   furosemide  (LASIX ) 40 MG tablet, Take 1 tablet (40 mg total) by mouth daily., Disp: 90 tablet, Rfl: 3   ibuprofen (ADVIL) 800 MG tablet, as needed for headache., Disp: , Rfl:    mavacamten  (CAMZYOS ) 5 MG CAPS capsule, Take 1 capsule (5 mg total) by mouth daily., Disp: 35 capsule, Rfl: 0   metoprolol  succinate (TOPROL -XL) 100 MG 24 hr tablet, Take 1.5 tablets (150 mg total) by mouth daily. Take with or immediately following a meal., Disp: 135 tablet, Rfl: 3   ondansetron  (ZOFRAN -ODT) 4 MG disintegrating tablet, Take 1 tablet (4 mg total) by mouth every 8 (eight) hours as needed for nausea or vomiting., Disp: 20 tablet, Rfl: 0   semaglutide -weight management (WEGOVY ) 2.4 MG/0.75ML SOAJ SQ injection, Inject 2.4 mg into the skin once a week., Disp: 3 mL, Rfl: 11   spironolactone  (ALDACTONE ) 25 MG tablet, Take 1 tablet (25 mg total) by  mouth daily., Disp: 90 tablet, Rfl: 3   venlafaxine  XR (EFFEXOR  XR) 75 MG 24 hr capsule, Take 1 capsule (75 mg total) by mouth daily with breakfast. Hold off on Lexapro  and try 75 mg boosted dose of Effexor  first, Disp: 90 capsule, Rfl: 3

## 2024-08-07 NOTE — Patient Instructions (Signed)
 Lydia Thompson, thank you for joining Lydia CHRISTELLA Dickinson, Lydia Thompson for today's virtual visit.  While this provider is not your primary care provider (PCP), if your PCP is located in our provider database this encounter information will be shared with them immediately following your visit.   A [facility] MyChart account gives you access to today's visit and all your visits, tests, and labs performed at [facility]  click here if you don't have a [facility] MyChart account or go to [email or web address]  Consent: (Patient) Lydia Thompson provided verbal consent for this virtual visit at the beginning of the encounter.  Current Medications:  Current Outpatient Medications:    traZODone  (DESYREL ) 50 MG tablet, Take 0.5-1 tablets (25-50 mg total) by mouth at bedtime as needed for sleep., Disp: 30 tablet, Rfl: 0   aspirin  EC 81 MG tablet, Take 1 tablet (81 mg total) by mouth daily. Swallow whole., Disp: 90 tablet, Rfl: 3   atorvastatin  (LIPITOR) 40 MG tablet, Take 1 tablet (40 mg total) by mouth daily., Disp: 90 tablet, Rfl: 3   BLACK CURRANT SEED OIL PO, Take 2 tablets by mouth daily., Disp: , Rfl:    budesonide -formoterol  (SYMBICORT ) 160-4.5 MCG/ACT inhaler, Inhale 2 puffs into the lungs in the morning and at bedtime. (Patient taking differently: Inhale 2 puffs into the lungs as needed.), Disp: 10.2 g, Rfl: 12   escitalopram  (LEXAPRO ) 10 MG tablet, Take 10 mg by mouth daily., Disp: , Rfl:    Fezolinetant  (VEOZAH ) 45 MG TABS, Take 1 tablet (45 mg total) by mouth daily at 6 (six) AM., Disp: 90 tablet, Rfl: 4   furosemide  (LASIX ) 40 MG tablet, Take 1 tablet (40 mg total) by mouth daily., Disp: 90 tablet, Rfl: 3   ibuprofen (ADVIL) 800 MG tablet, as needed for headache., Disp: , Rfl:    mavacamten  (CAMZYOS ) 5 MG CAPS capsule, Take 1 capsule (5 mg total) by mouth daily., Disp: 35 capsule, Rfl: 0   metoprolol  succinate (TOPROL -XL) 100 MG 24 hr tablet, Take 1.5 tablets (150 mg total) by  mouth daily. Take with or immediately following a meal., Disp: 135 tablet, Rfl: 3   ondansetron  (ZOFRAN -ODT) 4 MG disintegrating tablet, Take 1 tablet (4 mg total) by mouth every 8 (eight) hours as needed for nausea or vomiting., Disp: 20 tablet, Rfl: 0   semaglutide -weight management (WEGOVY ) 2.4 MG/0.75ML SOAJ SQ injection, Inject 2.4 mg into the skin once a week., Disp: 3 mL, Rfl: 11   spironolactone  (ALDACTONE ) 25 MG tablet, Take 1 tablet (25 mg total) by mouth daily., Disp: 90 tablet, Rfl: 3   venlafaxine  XR (EFFEXOR  XR) 75 MG 24 hr capsule, Take 1 capsule (75 mg total) by mouth daily with breakfast. Hold off on Lexapro  and try 75 mg boosted dose of Effexor  first, Disp: 90 capsule, Rfl: 3   Medications ordered in this encounter:  Meds ordered this encounter  Medications   traZODone  (DESYREL ) 50 MG tablet    Sig: Take 0.5-1 tablets (25-50 mg total) by mouth at bedtime as needed for sleep.    Dispense:  30 tablet    Refill:  0    Supervising Provider:   LAMPTEY, PHILIP O [[id number]]     *If you need refills on other medications prior to your next appointment, please contact your pharmacy*  Follow-Up: Call back or seek an in-person evaluation if the symptoms worsen or if the condition fails to improve as anticipated.  [facility] [phone]  Other Instructions  Trazodone  Tablets What is this medication? TRAZODONE  (TRAZ oh done) treats depression. It increases the amount of serotonin in the brain, a substance that helps regulate mood. This medicine may be used for other purposes; ask your health care provider or pharmacist if you have questions. COMMON BRAND NAME(S): Desyrel  What should I tell my care team before I take this medication? They need to know if you have any of these conditions: Bipolar disorder Bleeding disorder Glaucoma Heart disease, or previous heart attack Irregular heartbeat or rhythm Kidney disease Liver disease Low levels of sodium in the  blood Suicidal thoughts, plans, or attempt by you or a family member An unusual or allergic reaction to trazodone , other medications, foods, dyes, or preservatives Pregnant or trying to get pregnant Breastfeeding How should I use this medication? Take this medication by mouth with a glass of water. Take it as directed on the prescription label at the same time every day. Take this medication shortly after a meal or a light snack. Keep taking this medication unless your care team tells you to stop. Stopping it too quickly can cause serious side effects. It can also make your condition worse. A special MedGuide will be given to you by the pharmacist with each prescription and refill. Be sure to read this information carefully each time. Talk to your care team about the use of this medication in children. Special care may be needed. Overdosage: If you think you have taken too much of this medicine contact a poison control center or emergency room at once. NOTE: This medicine is only for you. Do not share this medicine with others. What if I miss a dose? If you miss a dose, take it as soon as you can. If it is almost time for your next dose, take only that dose. Do not take double or extra doses. What may interact with this medication? Do not take this medication with any of the following: Certain medications for fungal infections, such as fluconazole, itraconazole, ketoconazole, posaconazole, voriconazole Cisapride Dronedarone Linezolid MAOIs, such as Carbex, Eldepryl, Marplan, Nardil, and Parnate Mesoridazine Methylene blue (injected into a vein) Pimozide Saquinavir Thioridazine This medication may also interact with the following: Alcohol Antiviral medications for HIV or AIDS Aspirin  and aspirin -like medications Barbiturates, such as phenobarbital Certain medications for blood pressure, heart disease, irregular heart beat Certain medications for mental health conditions Certain  medications for migraine headache, such as almotriptan, eletriptan, frovatriptan, naratriptan, rizatriptan, sumatriptan, zolmitriptan Certain medications for seizures, such as carbamazepine and phenytoin Certain medications for sleep Certain medications that treat or prevent blood clots, such as dalteparin, enoxaparin , warfarin Digoxin Fentanyl Lithium NSAIDS, medications for pain and inflammation, such as ibuprofen or naproxen  Other medications that cause heart rhythm changes Rasagiline Supplements, such as St. John's wort, kava kava, valerian Tramadol Tryptophan This list may not describe all possible interactions. Give your health care provider a list of all the medicines, herbs, non-prescription drugs, or dietary supplements you use. Also tell them if you smoke, drink alcohol, or use illegal drugs. Some items may interact with your medicine. What should I watch for while using this medication? Visit your care team for regular checks on your progress. Tell your care team if your symptoms do not start to get better or if they get worse. Because it may take several weeks to see the full effects of this medication, it is important to continue your treatment as prescribed by your care team. Watch for new or worsening thoughts of suicide or depression.  This includes sudden changes in mood, behaviors, or thoughts. These changes can happen at any time but are more common in the beginning of treatment or after a change in dose. Call your care team right away if you experience these thoughts or worsening depression. This medication may cause mood and behavior changes, such as anxiety, nervousness, irritability, hostility, restlessness, excitability, hyperactivity, or trouble sleeping. These changes can happen at any time but are more common in the beginning of treatment or after a change in dose. Call your care team right away if you notice any of these symptoms. This medication may affect your  coordination, reaction time, or judgment. Do not drive or operate machinery until you know how this medication affects you. Sit up or stand slowly to reduce the risk of dizzy or fainting spells. Drinking alcohol with this medication can increase the risk of these side effects. This medication may cause dry eyes and blurred vision. If you wear contact lenses you may feel some discomfort. Lubricating drops may help. See your care team if the problem does not go away or is severe. Your mouth may get dry. Chewing sugarless gum or sucking hard candy and drinking plenty of water may help. Contact your care team if the problem does not go away or is severe. What side effects may I notice from receiving this medication? Side effects that you should report to your care team as soon as possible: Allergic reactions--skin rash, itching, hives, swelling of the face, lips, tongue, or throat Bleeding--bloody or black, tar-like stools, red or dark brown urine, vomiting blood or brown material that looks like coffee grounds, small, red or purple spots on skin, unusual bleeding or bruising Heart rhythm changes--fast or irregular heartbeat, dizziness, feeling faint or lightheaded, chest pain, trouble breathing Low blood pressure--dizziness, feeling faint or lightheaded, blurry vision Low sodium level--muscle weakness, fatigue, dizziness, headache, confusion Prolonged or painful erection Serotonin syndrome--irritability, confusion, fast or irregular heartbeat, muscle stiffness, twitching muscles, sweating, high fever, seizures, chills, vomiting, diarrhea Sudden eye pain or change in vision such as blurry vision, seeing halos around lights, vision loss Thoughts of suicide or self-harm, worsening mood, feelings of depression Side effects that usually do not require medical attention (report to your care team if they continue or are bothersome): Change in sex drive or performance Constipation Dizziness Drowsiness Dry  mouth This list may not describe all possible side effects. Call your doctor for medical advice about side effects. You may report side effects to FDA at 1-800-FDA-1088. Where should I keep my medication? Keep out of the reach of children and pets. Store at room temperature between 15 and 30 degrees C (59 to 86 degrees F). Protect from light. Keep container tightly closed. Throw away any unused medication after the expiration date. NOTE: This sheet is a summary. It may not cover all possible information. If you have questions about this medicine, talk to your doctor, pharmacist, or health care provider.  2025 Elsevier/Gold Standard (2023-10-25 00:00:00)   If you have been instructed to have an in-person evaluation today at a local Urgent Care facility, please use the link below. It will take you to a list of all of our available [facility], including address, phone number and hours of operation. Please do not delay care.  [facility]  If you or a family member do not have a primary care provider, use the link below to schedule a visit and establish care. When you choose a [facility]  primary care physician or advanced practice provider, you gain a long-term partner in health. Find a Primary Care Provider  Learn more about [facility]'s in-office and virtual care options: [facility] Get Care Now

## 2024-08-08 ENCOUNTER — Other Ambulatory Visit: Payer: Self-pay

## 2024-08-12 ENCOUNTER — Other Ambulatory Visit: Payer: Self-pay

## 2024-08-13 ENCOUNTER — Other Ambulatory Visit: Payer: Self-pay

## 2024-08-13 ENCOUNTER — Encounter (HOSPITAL_COMMUNITY): Payer: Self-pay

## 2024-08-14 ENCOUNTER — Ambulatory Visit (HOSPITAL_COMMUNITY)
Admission: RE | Admit: 2024-08-14 | Discharge: 2024-08-14 | Disposition: A | Discharge status: Home / Self Care | Source: Ambulatory Visit | Attending: Cardiovascular Disease | Admitting: Cardiovascular Disease

## 2024-08-14 DIAGNOSIS — I421 Obstructive hypertrophic cardiomyopathy: Secondary | ICD-10-CM | POA: Insufficient documentation

## 2024-08-17 LAB — ECHOCARDIOGRAM COMPLETE
Area-P 1/2: 4.05 cm2
S' Lateral: 2.2 cm

## 2024-08-19 ENCOUNTER — Other Ambulatory Visit: Payer: Self-pay

## 2024-08-19 ENCOUNTER — Ambulatory Visit: Payer: Self-pay | Admitting: Internal Medicine

## 2024-08-19 DIAGNOSIS — I421 Obstructive hypertrophic cardiomyopathy: Secondary | ICD-10-CM

## 2024-08-19 MED ORDER — MAVACAMTEN 5 MG PO CAPS: 5.0000 mg | ORAL_CAPSULE | Freq: Every day | ORAL | 1 refills | Status: AC

## 2024-08-23 ENCOUNTER — Other Ambulatory Visit: Payer: Self-pay

## 2024-08-23 ENCOUNTER — Encounter (HOSPITAL_COMMUNITY): Payer: Self-pay

## 2024-08-26 ENCOUNTER — Other Ambulatory Visit: Payer: Self-pay | Admitting: Internal Medicine

## 2024-08-26 ENCOUNTER — Other Ambulatory Visit (HOSPITAL_COMMUNITY): Payer: Self-pay

## 2024-08-26 ENCOUNTER — Encounter (HOSPITAL_COMMUNITY): Payer: Self-pay

## 2024-08-26 ENCOUNTER — Other Ambulatory Visit: Payer: Self-pay

## 2024-08-28 ENCOUNTER — Other Ambulatory Visit: Payer: Self-pay

## 2024-08-30 ENCOUNTER — Other Ambulatory Visit: Payer: Self-pay | Admitting: Internal Medicine

## 2024-08-30 ENCOUNTER — Other Ambulatory Visit (HOSPITAL_COMMUNITY): Payer: Self-pay

## 2024-08-30 MED ORDER — FUROSEMIDE 40 MG PO TABS
40.0000 mg | ORAL_TABLET | Freq: Every day | ORAL | 3 refills | Status: AC
  Filled 2024-09-03: qty 180, 180d supply, fill #0
  Filled 2024-09-10: qty 90, 90d supply, fill #0
  Filled 2024-09-23: qty 90, 90d supply, fill #1

## 2024-09-03 ENCOUNTER — Other Ambulatory Visit: Payer: Self-pay

## 2024-09-03 ENCOUNTER — Other Ambulatory Visit (HOSPITAL_COMMUNITY): Payer: Self-pay

## 2024-09-05 ENCOUNTER — Other Ambulatory Visit: Payer: Self-pay

## 2024-09-06 ENCOUNTER — Other Ambulatory Visit: Payer: Self-pay

## 2024-09-10 ENCOUNTER — Other Ambulatory Visit: Payer: Self-pay

## 2024-09-10 ENCOUNTER — Other Ambulatory Visit (HOSPITAL_COMMUNITY): Payer: Self-pay

## 2024-09-12 ENCOUNTER — Other Ambulatory Visit: Payer: Self-pay

## 2024-09-17 ENCOUNTER — Other Ambulatory Visit: Payer: Self-pay

## 2024-09-18 ENCOUNTER — Other Ambulatory Visit: Payer: Self-pay

## 2024-09-19 ENCOUNTER — Other Ambulatory Visit: Payer: Self-pay

## 2024-09-20 ENCOUNTER — Other Ambulatory Visit: Payer: Self-pay

## 2024-09-20 ENCOUNTER — Ambulatory Visit (HOSPITAL_COMMUNITY)

## 2024-09-23 ENCOUNTER — Other Ambulatory Visit: Payer: Self-pay

## 2024-09-24 ENCOUNTER — Other Ambulatory Visit: Payer: Self-pay | Admitting: Cardiology

## 2024-09-24 DIAGNOSIS — I422 Other hypertrophic cardiomyopathy: Secondary | ICD-10-CM

## 2024-09-24 DIAGNOSIS — I5032 Chronic diastolic (congestive) heart failure: Secondary | ICD-10-CM

## 2024-09-24 DIAGNOSIS — I10 Essential (primary) hypertension: Secondary | ICD-10-CM

## 2024-09-26 ENCOUNTER — Other Ambulatory Visit: Payer: Self-pay

## 2024-09-26 ENCOUNTER — Other Ambulatory Visit (HOSPITAL_COMMUNITY): Payer: Self-pay

## 2024-10-03 ENCOUNTER — Ambulatory Visit (HOSPITAL_COMMUNITY)

## 2024-12-11 ENCOUNTER — Encounter: Admitting: Internal Medicine

## 2024-12-17 ENCOUNTER — Ambulatory Visit: Admitting: Pulmonary Disease

## 2025-01-23 ENCOUNTER — Ambulatory Visit (HOSPITAL_COMMUNITY)
